# Patient Record
Sex: Male | Born: 1965 | Race: White | Hispanic: No | Smoking: Former smoker
Health system: Southern US, Community
[De-identification: ages and names within clinical notes are randomized; demographics above are authoritative.]

## PROBLEM LIST (undated history)

## (undated) DIAGNOSIS — K859 Acute pancreatitis without necrosis or infection, unspecified: Secondary | ICD-10-CM

## (undated) DIAGNOSIS — G629 Polyneuropathy, unspecified: Secondary | ICD-10-CM

## (undated) DIAGNOSIS — E11622 Type 2 diabetes mellitus with other skin ulcer: Secondary | ICD-10-CM

## (undated) DIAGNOSIS — M1990 Unspecified osteoarthritis, unspecified site: Secondary | ICD-10-CM

## (undated) DIAGNOSIS — F909 Attention-deficit hyperactivity disorder, unspecified type: Secondary | ICD-10-CM

## (undated) DIAGNOSIS — D649 Anemia, unspecified: Secondary | ICD-10-CM

## (undated) DIAGNOSIS — I639 Cerebral infarction, unspecified: Secondary | ICD-10-CM

## (undated) DIAGNOSIS — L98421 Non-pressure chronic ulcer of back limited to breakdown of skin: Secondary | ICD-10-CM

## (undated) DIAGNOSIS — E11621 Type 2 diabetes mellitus with foot ulcer: Secondary | ICD-10-CM

## (undated) DIAGNOSIS — I219 Acute myocardial infarction, unspecified: Secondary | ICD-10-CM

## (undated) DIAGNOSIS — J449 Chronic obstructive pulmonary disease, unspecified: Secondary | ICD-10-CM

## (undated) DIAGNOSIS — M109 Gout, unspecified: Secondary | ICD-10-CM

## (undated) DIAGNOSIS — I10 Essential (primary) hypertension: Secondary | ICD-10-CM

## (undated) DIAGNOSIS — K219 Gastro-esophageal reflux disease without esophagitis: Secondary | ICD-10-CM

## (undated) DIAGNOSIS — Z973 Presence of spectacles and contact lenses: Secondary | ICD-10-CM

## (undated) DIAGNOSIS — Z9289 Personal history of other medical treatment: Secondary | ICD-10-CM

## (undated) DIAGNOSIS — L02611 Cutaneous abscess of right foot: Secondary | ICD-10-CM

## (undated) DIAGNOSIS — M869 Osteomyelitis, unspecified: Secondary | ICD-10-CM

## (undated) DIAGNOSIS — F419 Anxiety disorder, unspecified: Secondary | ICD-10-CM

## (undated) HISTORY — PX: FOOT SURGERY: SHX648

## (undated) HISTORY — PX: APPENDECTOMY: SHX54

## (undated) HISTORY — PX: LEG SURGERY: SHX1003

---
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
## 2002-12-22 ENCOUNTER — Encounter: Payer: Self-pay | Admitting: Emergency Medicine

## 2002-12-22 ENCOUNTER — Emergency Department (HOSPITAL_COMMUNITY): Admission: EM | Admit: 2002-12-22 | Discharge: 2002-12-22 | Payer: Self-pay | Admitting: Emergency Medicine

## 2007-01-24 ENCOUNTER — Other Ambulatory Visit: Payer: Self-pay

## 2007-01-24 ENCOUNTER — Other Ambulatory Visit: Payer: Self-pay | Admitting: Emergency Medicine

## 2007-01-24 ENCOUNTER — Ambulatory Visit: Payer: Self-pay | Admitting: Psychiatry

## 2007-01-24 ENCOUNTER — Inpatient Hospital Stay (HOSPITAL_COMMUNITY): Admission: AD | Admit: 2007-01-24 | Discharge: 2007-01-28 | Payer: Self-pay | Admitting: Psychiatry

## 2007-05-04 ENCOUNTER — Ambulatory Visit (HOSPITAL_COMMUNITY): Admission: RE | Admit: 2007-05-04 | Discharge: 2007-05-04 | Payer: Self-pay | Admitting: Family Medicine

## 2007-06-27 ENCOUNTER — Inpatient Hospital Stay (HOSPITAL_COMMUNITY): Admission: EM | Admit: 2007-06-27 | Discharge: 2007-06-30 | Payer: Self-pay | Admitting: Psychiatry

## 2007-06-28 ENCOUNTER — Ambulatory Visit: Payer: Self-pay | Admitting: Psychiatry

## 2007-08-03 ENCOUNTER — Ambulatory Visit (HOSPITAL_COMMUNITY): Admission: RE | Admit: 2007-08-03 | Discharge: 2007-08-03 | Payer: Self-pay | Admitting: Family Medicine

## 2007-10-31 ENCOUNTER — Ambulatory Visit (HOSPITAL_COMMUNITY): Admission: RE | Admit: 2007-10-31 | Discharge: 2007-10-31 | Payer: Self-pay | Admitting: Family Medicine

## 2007-11-24 ENCOUNTER — Emergency Department (HOSPITAL_COMMUNITY): Admission: EM | Admit: 2007-11-24 | Discharge: 2007-11-24 | Payer: Self-pay | Admitting: Emergency Medicine

## 2008-06-28 ENCOUNTER — Ambulatory Visit (HOSPITAL_COMMUNITY): Admission: RE | Admit: 2008-06-28 | Discharge: 2008-06-28 | Payer: Self-pay | Admitting: Family Medicine

## 2009-01-09 ENCOUNTER — Emergency Department (HOSPITAL_COMMUNITY): Admission: EM | Admit: 2009-01-09 | Discharge: 2009-01-10 | Payer: Self-pay | Admitting: Emergency Medicine

## 2010-07-25 LAB — GLUCOSE, CAPILLARY
Glucose-Capillary: 163 mg/dL — ABNORMAL HIGH (ref 70–99)
Glucose-Capillary: 92 mg/dL (ref 70–99)

## 2010-07-25 LAB — HEPATIC FUNCTION PANEL
ALT: 12 U/L (ref 0–53)
AST: 19 U/L (ref 0–37)
Albumin: 4.5 g/dL (ref 3.5–5.2)
Alkaline Phosphatase: 57 U/L (ref 39–117)
Bilirubin, Direct: 0.1 mg/dL (ref 0.0–0.3)
Indirect Bilirubin: 0.6 mg/dL (ref 0.3–0.9)
Total Bilirubin: 0.7 mg/dL (ref 0.3–1.2)
Total Protein: 7.1 g/dL (ref 6.0–8.3)

## 2010-07-25 LAB — BASIC METABOLIC PANEL
BUN: 5 mg/dL — ABNORMAL LOW (ref 6–23)
CO2: 23 mEq/L (ref 19–32)
Calcium: 9.1 mg/dL (ref 8.4–10.5)
Chloride: 113 mEq/L — ABNORMAL HIGH (ref 96–112)
Creatinine, Ser: 0.72 mg/dL (ref 0.4–1.5)
GFR calc Af Amer: >60 mL/min (ref >60)
GFR calc non Af Amer: >60 mL/min (ref >60)
Glucose, Bld: 81 mg/dL (ref 70–99)
Potassium: 3.4 mEq/L — ABNORMAL LOW (ref 3.5–5.1)
Sodium: 143 mEq/L (ref 135–145)

## 2010-07-25 LAB — CK TOTAL AND CKMB (NOT AT ARMC)
CK, MB: 3.1 ng/mL (ref 0.3–4.0)
CK, MB: 3.8 ng/mL (ref 0.3–4.0)
Relative Index: 1.6 (ref 0.0–2.5)
Relative Index: 1.7 (ref 0.0–2.5)
Total CK: 188 U/L (ref 7–232)
Total CK: 228 U/L (ref 7–232)

## 2010-07-25 LAB — CBC
HCT: 41.1 % (ref 39.0–52.0)
Hemoglobin: 14.5 g/dL (ref 13.0–17.0)
MCHC: 35.3 g/dL (ref 30.0–36.0)
MCV: 89.7 fL (ref 78.0–100.0)
Platelets: 193 10*3/uL (ref 150–400)
RBC: 4.58 MIL/uL (ref 4.22–5.81)
RDW: 13.8 % (ref 11.5–15.5)
WBC: 13 10*3/uL — ABNORMAL HIGH (ref 4.0–10.5)

## 2010-07-25 LAB — LIPASE, BLOOD: Lipase: 18 U/L (ref 11–59)

## 2010-07-25 LAB — ETHANOL
Alcohol, Ethyl (B): 116 mg/dL — ABNORMAL HIGH (ref 0–10)
Alcohol, Ethyl (B): 289 mg/dL — ABNORMAL HIGH (ref 0–10)

## 2010-07-25 LAB — RAPID URINE DRUG SCREEN, HOSP PERFORMED
Amphetamines: NOT DETECTED
Barbiturates: NOT DETECTED
Benzodiazepines: POSITIVE — AB
Cocaine: NOT DETECTED
Opiates: POSITIVE — AB
Tetrahydrocannabinol: NOT DETECTED

## 2010-07-25 LAB — DIFFERENTIAL
Basophils Absolute: 0 10*3/uL (ref 0.0–0.1)
Basophils Relative: 0 % (ref 0–1)
Eosinophils Absolute: 0.2 10*3/uL (ref 0.0–0.7)
Eosinophils Relative: 2 % (ref 0–5)
Lymphocytes Relative: 33 % (ref 12–46)
Lymphs Abs: 4.3 10*3/uL — ABNORMAL HIGH (ref 0.7–4.0)
Monocytes Absolute: 0.7 10*3/uL (ref 0.1–1.0)
Monocytes Relative: 5 % (ref 3–12)
Neutro Abs: 7.7 10*3/uL (ref 1.7–7.7)
Neutrophils Relative %: 59 % (ref 43–77)

## 2010-07-25 LAB — POCT CARDIAC MARKERS
CKMB, poc: 1.7 ng/mL (ref 1.0–8.0)
Myoglobin, poc: 34.5 ng/mL (ref 12–200)
Troponin i, poc: <0.05 ng/mL (ref 0.00–0.09)

## 2010-07-25 LAB — D-DIMER, QUANTITATIVE: D-Dimer, Quant: 0.22 ug/mL-FEU (ref 0.00–0.48)

## 2010-07-25 LAB — TRICYCLICS SCREEN, URINE: TCA Scrn: NOT DETECTED

## 2010-07-25 LAB — TROPONIN I
Troponin I: 0.05 ng/mL (ref 0.00–0.06)
Troponin I: 0.05 ng/mL (ref 0.00–0.06)

## 2010-09-02 NOTE — H&P (Signed)
[name], PUGLIA NO.:  [id number]   MEDICAL RECORD NO.:  [id number]           PATIENT TYPE:   LOCATION:                                 FACILITY:   PHYSICIAN:  Anselm Jungling, MD  DATE OF BIRTH:  1965/05/16   DATE OF ADMISSION:  06/27/2007  DATE OF DISCHARGE:                       PSYCHIATRIC ADMISSION ASSESSMENT   IDENTIFICATION:  A 45 year old white male who is married.  This is a  voluntary admission.   HISTORY OF PRESENT ILLNESS:  This is the second [facility] admission for this  45 year old with a history of alcohol dependence.  He initially  presented in the emergency room with chest pain after chasing his  brother through the woods.  He had been drinking all day.  His initial  alcohol level was 348 in the emergency department.  He reports that he  stopped the Antabuse that he had been previously taking, several weeks  ago, and wished that he had kept it up.  He would like to be restarted  on it.  York Spaniel he is having problems with his alcohol use escalating  again, and his wife had reported in the emergency room that he had been  making some suicidal statements about harming himself and others when he  was under the influence of alcohol.  Requested help with getting back  off the alcohol again when he was in the emergency room.  Wanted to be  admitted to work on it.  Had had quite a lot of depression at home and  drinking.  Had suicidal thoughts of possibly shooting himself and has  access to guns.  He has a history of prior suicide attempts three to  four times by cutting himself.  His drinking has escalated to seven to  eight 40-ounce beers most every day of the week.  He has been drinking  alcohol since the age of 43.  He endorses a history of delirium tremors,  muscle cramping and tremors at night and sweats when he tries to cut  back on alcohol.  He denies any homicidal thoughts.  Endorses passive  suicidal thoughts today.  He was evaluated for his  chest pain in the  emergency room and remained there until the morning of March 9 due to  some tachycardia, and his workup revealed no acute process.  He was  screened with an EKG and cardiac enzymes.   PAST PSYCHIATRIC HISTORY:  This is the second [facility] admission.  He has  been followed in the past by Dr. Duayne Cal at [facility].  He was last admitted here to this unit October 6th to 10th 2008  to the service of Dr. Dub Mikes.  At that time, he was discharged on Antabuse  and Campral for alcohol cravings along with Prozac CR 25 mg daily.  He  reports that he stopped the Antabuse about a month ago and still has a  supply of that at home.  He did not take the Campral for very long after  he was discharged and continues to take the Paxil CR.   SOCIAL HISTORY:  A  45 year old white male with history of several  medical problems including some back pain has endorsed in the past some  ups and downs with his marriage.  Had some difficult relationship with  his father growing up.  Has a history of prior DWIs.  Continues to  grieve the death of his father 2 years ago.   FAMILY HISTORY:  None.   MEDICAL HISTORY:  The patient's primary care Kylar Speelman is Dr. Oval Linsey.  Medical problems are:  1. Depression.  2. Gout.  3. Pancreatitis.  4. Diabetes mellitus type 2.   PAST MEDICAL HISTORY:  Is remarkable for history of heart murmur and  surgery on his head.   CURRENT MEDICATIONS:  1. Paxil CR 25 mg.  2. Advair Diskus 250/50 one puff b.i.d.  3. Trazodone 50 mg at bedtime.   He had previously been on the metformin 1000 mg b.i.d. and Amaryl 4 mg  daily which was stopped by Dr. Janna Arch.  He is to monitor his blood  sugar and takes a carbohydrate-controlled diet.   DRUG ALLERGIES:  ARE ASPIRIN AND PENICILLIN.   POSITIVE PHYSICAL FINDINGS:  Physical exam done in the emergency room at  [facility] as noted in the record.  Urine drug screen positive  for opiates and  marijuana.  CBC:  WBC 12.3, hemoglobin 15.7, hematocrit  44, platelets 197,000.  Chemistry:  Sodium 130, potassium 3.8, chloride  93, carbon dioxide 21, BUN 7, creatinine 0.77, random glucose 107.  Liver enzymes:  SGOT 58, SGPT 24, alkaline phosphatase 91, total  bilirubin 1.5.  His lipase was 17 mg per deciliter.   MENTAL STATUS EXAM:  Fully alert male, pleasant, cooperative.  Moist  skin.  Slightly tremulous.  Appears to be in detox, and his CIWA score  was gauged at about a 12.  Speech is normal in pace, tone, production.  He is fully oriented x4, coherent, asking for help, wanting to get back  off the alcohol, regretting that he ever quit the Antabuse and would  like to get back on it as soon as possible.  Speech is normal in form  and production.  Mood neutral.  Thought process:  Logical and coherent.  He has some passive suicidal thoughts today but denies any active  suicidal thoughts.  Cognition is intact.  No confusion, flight of ideas,  or signs of delirium.  No homicidal thought.   AXIS I:  Depressive disorder NOS.  Alcohol abuse and dependence.  AXIS II:  No diagnosis.  AXIS III:  Elevated liver enzymes.  History of pancreatitis.  Diet-  controlled diabetes.  Hyponatremia.  AXIS IV:  Severe stress with grief of the father's death.  AXIS V:  Current 40, past year not known.   PLAN:  Is to voluntarily admit the patient with the goal of a safe detox  and to alleviate his suicidal thoughts.  We started him on a Librium  protocol which will take 4 days.  He is actually taking medication,  participating in our dual diagnosis group.  We are going to see if we  can secure the weapons at home by talking with his wife.  We are going  to check CBGs a.c. breakfast and supper and will recheck a CMET in 2  days.  Meanwhile, we have restarted his Paxil and talked to him about  considering restarting on the Antabuse which at this point he is  agreeable to and will see if we can arrange  ongoing follow-up  for him  with [facility].  Estimated length of stay is 5  days.      Margaret A. Lorin Picket, N.P.      Anselm Jungling, MD  Electronically Signed    MAS/MEDQ  D:  06/27/2007  T:  06/28/2007  Job:  [id number]

## 2010-09-02 NOTE — Discharge Summary (Signed)
NAMEMARKEEM, [name] NO.:  [id number]   MEDICAL RECORD NO.:  [id number]          PATIENT TYPE:  IPS   LOCATION:  0403                          FACILITY:  BH   PHYSICIAN:  Anselm Jungling, MD  DATE OF BIRTH:  1965/08/29   DATE OF ADMISSION:  06/26/2007  DATE OF DISCHARGE:  06/30/2007                               DISCHARGE SUMMARY   IDENTIFYING DATA REASON FOR ADMISSION:  This was the second [facility]  admission for [name], a 45 year old male with alcohol dependence.  He  was admitted for detoxification.  Please refer to the admission note for  further details pertaining to the symptoms, circumstances and history  that led to his hospitalization.  He was given initial an axis I  diagnosis of alcohol dependence.   MEDICAL AND LABORATORY:  The patient was in good health without any  active or chronic medical problems.  He was medically and physically  assessed by the psychiatric nurse practitioner.  He did have a history  of asthma and was continued on Advair.   HOSPITAL COURSE:  The patient was admitted to the adult inpatient  psychiatric service.  He presented as a well-nourished, well-developed  male who was initially anxious, tremulous, but alert and fully oriented,  showing signs of alcohol withdrawal.  There were no signs or symptoms of  psychosis, thought disorder or delirium.   The patient was treated with a Librium detoxification protocol, and was  also given Paxil CR 25 mg daily for depressive symptoms.   He participated in various therapeutic groups and activities, including  those geared towards 12-step recovery.  The patient had some previous AA  experience and indicated that he would embrace it again following his  discharge.  There was a family session that occurred with the patient's  wife by telephone on the 4th hospital day.  His wife was very  supportive.  Discharge and aftercare needs were discussed at length.   The patient was appropriate  for discharge on the 7th hospital day.  He  agreed to the following aftercare plan.   AFTERCARE:  The patient was to follow up with Dr. Waunita Schooner at [facility] with an appointment to be determined at the time of  this dictation.   DISCHARGE MEDICATIONS:  Paxil CR 25 mg daily, Advair 1 puff twice daily,  Antabuse 250 mg daily and trazodone 50 mg q.h.s. p.r.n. insomnia.   DISCHARGE DIAGNOSES:  AXIS I:  Alcohol dependence and substance-induced  mood disorder.  AXIS II:  Deferred.  AXIS III:  History of asthma.  AXIS IV:  Stressors, severe.  AXIS V:  GAF on discharge 60.      Anselm Jungling, MD  Electronically Signed     SPB/MEDQ  D:  07/29/2007  T:  07/29/2007  Job:  [id number]

## 2010-09-05 NOTE — Discharge Summary (Signed)
NAMEMONTI, JILEK NO.:  [id number]   MEDICAL RECORD NO.:  [id number]          PATIENT TYPE:  IPS   LOCATION:  0504                          FACILITY:  BH   PHYSICIAN:  Geoffery Lyons, M.D.      DATE OF BIRTH:  [birth year]   DATE OF ADMISSION:  01/24/2007  DATE OF DISCHARGE:  01/28/2007                               DISCHARGE SUMMARY   CHIEF COMPLAINT:  This was the first admission to [facility] for this 45 year old male, history of depression, alcohol abuse,  thinking about hurting himself, does not want to hurt himself, wanting  to be there for grandchildren, last drink some day before this  admission.   HISTORY OF PRESENT ILLNESS:  First time at Behavior Health, seeing Dr.  Kerby Nora at [facility], also sees a counselor  for history of persistent use of alcohol.  Denies any other substance  use.   MEDICAL HISTORY:  Asthma and bronchitis, pancreatitis, slipped disk in  the back, gout, diabetes mellitus.   MEDICATIONS:  1. Albuterol two puffs daily.  2. Klonopin 0.5 one in the morning and three at night.  3. Paxil CR 25 mg per day.  4. Metformin 500 two tablets twice a day.  5. Amaryl 4 mg one twice a day.   Physical exam performed failed to show any acute findings.   LABORATORY WORKUP:  Not available in the chart.   PHYSICAL EXAMINATION:  Reveals an alert, cooperative male.  Speech  normal rate, tempo and production.  Mood anxious and depressed, affect  broad.  Thought processes logical, coherent and relevant.  No evidence  of delusions.  No active suicidal ideas, no hallucinations.  Cognition  well-preserved.   ADMITTING DIAGNOSIS:  AXIS I:  Alcohol abuse, rule out dependence.  Depressive disorder not otherwise specified.  AXIS II:  No diagnosis.  AXIS III:  Asthma, bronchitis, pancreatitis, diskogenic back disease,  gout, diabetes mellitus.  AXIS IV:  Moderate.  AXIS V:  GAF upon admission 35, highest GAF  in the last year 60.   COURSE IN THE HOSPITAL:  He was admitted.  He was started in individual  and group psychotherapy.  As already stated this is a 45 year old male  who endorsed that everything was going downhill, has trouble with the  father.  The father died.  Trouble with the father's death, died when he  was 42 of a massive heart attack.  Endorsed financial struggles.  Endorsed he is broke.  Had court the 22nd driving without a license.  Endorsed decreased sleep.  Thinking about the fight all the time.  Mother not supportive.  He is disabled due to multiple physical  conditions, pancreatitis, gout, diskogenic disease.  Endorsed ups and  downs with his wife.  Day before this admit the Sunday before this  admission drank seven 14 ounce beers, drinking almost every day, can go  few months and started drinking when he was 14.  On October 8 he was  more open about his alcohol use.  Admits that he was dependent on it.  They separated due to his alcohol use.  He feels that if he were to quit  there was a chance that they would get back together.  Mostly drinks by  himself.  Wanted, he was willing to start the Antabuse as he said that  he was still going to be around the alcohol and wanting to be sure that  he did not relapse and that he was committed to abstaining.  He was  going to be back in the trailer and says that the wife was coming along  and sees that he is committed to abstaining.  So overall he was feeling  much better.  We worked on Pharmacologist, relapse prevention.  He was  more open about the past events when he was increasingly more depressed,  two previous of gestures for shooting himself but a friend stopped him.  October 10 he was in full contact with reality.  Fully detoxed.  No  active suicidal or homicidal idea, no hallucinations or delusions.  Endorsed that he was really committed to abstinence, was going to pursue  the Antabuse, was going to outpatient treatment and  was hopeful that he  was going to get back together with his wife, but was also prepared for  her to decide against this.   DISCHARGE DIAGNOSIS:  AXIS I:  Alcohol dependence.  Depressive disorder  not otherwise specified.  AXIS II:  No diagnosis.  AXIS III:  Asthma, bronchitis, diabetes mellitus, history of  pancreatitis.  AXIS IV:  Moderate.  AXIS V:  Upon discharge 50.   Discharged on Paxil CR 25 mg per day, Glucophage 500 two twice a day,  Amaryl 4 mg one twice a day, albuterol 2 puffs daily, Advair 250/50  Diskus 1 puff twice a day, Antabuse 250 mg per day, Campral 333 mg two  3x a day, albuterol inhaler 2 puffs every 4 hours, trazodone 50 mg at  bedtime for sleep.  Followup at [facility].      Geoffery Lyons, M.D.  Electronically Signed     IL/MEDQ  D:  02/25/2007  T:  02/27/2007  Job:  [id number]

## 2011-01-12 LAB — DIFFERENTIAL
Basophils Absolute: 0.1
Basophils Relative: 1
Eosinophils Absolute: 0.2
Eosinophils Relative: 2
Lymphocytes Relative: 20
Lymphs Abs: 2.5
Monocytes Absolute: 0.5
Monocytes Relative: 4
Neutro Abs: 9 — ABNORMAL HIGH
Neutrophils Relative %: 73

## 2011-01-12 LAB — CBC
HCT: 44
Hemoglobin: 15.7
MCHC: 35.5
MCV: 89.6
Platelets: 197
RBC: 4.92
RDW: 13.4
WBC: 12.3 — ABNORMAL HIGH

## 2011-01-12 LAB — POCT CARDIAC MARKERS
CKMB, poc: 1.7
CKMB, poc: 1.8
Myoglobin, poc: 54.7
Myoglobin, poc: 97.6
Operator id: 211291
Operator id: 211291
Troponin i, poc: <0.05
Troponin i, poc: <0.05

## 2011-01-12 LAB — HEPATIC FUNCTION PANEL
ALT: 24
AST: 58 — ABNORMAL HIGH
Albumin: 4.6
Alkaline Phosphatase: 91
Bilirubin, Direct: 0.2
Indirect Bilirubin: 1.3 — ABNORMAL HIGH
Total Bilirubin: 1.5 — ABNORMAL HIGH
Total Protein: 7.8

## 2011-01-12 LAB — RAPID URINE DRUG SCREEN, HOSP PERFORMED
Amphetamines: NOT DETECTED
Barbiturates: NOT DETECTED
Benzodiazepines: NOT DETECTED
Cocaine: NOT DETECTED
Opiates: POSITIVE — AB
Tetrahydrocannabinol: POSITIVE — AB

## 2011-01-12 LAB — BASIC METABOLIC PANEL
BUN: 7
CO2: 21
Calcium: 9.5
Chloride: 93 — ABNORMAL LOW
Creatinine, Ser: 0.77
GFR calc Af Amer: >60
GFR calc non Af Amer: >60
Glucose, Bld: 107 — ABNORMAL HIGH
Potassium: 3.8
Sodium: 130 — ABNORMAL LOW

## 2011-01-12 LAB — ETHANOL
Alcohol, Ethyl (B): 222 — ABNORMAL HIGH
Alcohol, Ethyl (B): 258 — ABNORMAL HIGH
Alcohol, Ethyl (B): 348 — ABNORMAL HIGH

## 2011-01-12 LAB — LIPASE, BLOOD: Lipase: 17

## 2011-01-16 LAB — CBC
HCT: 38.4 — ABNORMAL LOW
Hemoglobin: 13.2
MCHC: 34.3
MCV: 92.6
Platelets: 153
RBC: 4.15 — ABNORMAL LOW
RDW: 14.2
WBC: 7

## 2011-01-16 LAB — BASIC METABOLIC PANEL
BUN: 7
CO2: 30
Calcium: 9
Chloride: 104
Creatinine, Ser: 0.76
GFR calc Af Amer: >60
GFR calc non Af Amer: >60
Glucose, Bld: 100 — ABNORMAL HIGH
Potassium: 4.3
Sodium: 139

## 2011-01-16 LAB — DIFFERENTIAL
Basophils Absolute: 0
Basophils Relative: 0
Eosinophils Absolute: 0.2
Eosinophils Relative: 3
Lymphocytes Relative: 28
Lymphs Abs: 2
Monocytes Absolute: 0.4
Monocytes Relative: 5
Neutro Abs: 4.4
Neutrophils Relative %: 63

## 2011-01-16 LAB — POCT CARDIAC MARKERS
CKMB, poc: <1 — ABNORMAL LOW
Myoglobin, poc: 29
Troponin i, poc: <0.05

## 2011-01-29 LAB — RAPID URINE DRUG SCREEN, HOSP PERFORMED
Amphetamines: NOT DETECTED
Barbiturates: NOT DETECTED
Benzodiazepines: NOT DETECTED
Cocaine: NOT DETECTED
Opiates: NOT DETECTED
Tetrahydrocannabinol: NOT DETECTED

## 2011-01-29 LAB — CBC
HCT: 42.7
Hemoglobin: 14.8
MCHC: 34.8
MCV: 99.2
Platelets: 176
RBC: 4.3
RDW: 13.4
WBC: 8.8

## 2011-01-29 LAB — ETHANOL: Alcohol, Ethyl (B): 155 — ABNORMAL HIGH

## 2011-01-29 LAB — BASIC METABOLIC PANEL WITH GFR
BUN: 3 — ABNORMAL LOW
CO2: 22
Calcium: 9
Chloride: 105
Creatinine, Ser: 0.82
GFR calc non Af Amer: >60
Glucose, Bld: 154 — ABNORMAL HIGH
Potassium: 3.7
Sodium: 137

## 2011-01-29 LAB — DIFFERENTIAL
Basophils Absolute: 0.1
Basophils Relative: 1
Eosinophils Absolute: 0.4
Eosinophils Relative: 4
Lymphocytes Relative: 23
Lymphs Abs: 2
Monocytes Absolute: 0.5
Monocytes Relative: 6
Neutro Abs: 5.8
Neutrophils Relative %: 66

## 2011-04-18 ENCOUNTER — Other Ambulatory Visit: Payer: Self-pay

## 2011-04-18 ENCOUNTER — Emergency Department (HOSPITAL_COMMUNITY)
Admission: EM | Admit: 2011-04-18 | Discharge: 2011-04-18 | Disposition: A | Discharge status: Home / Self Care | Payer: Medicaid Other | Attending: Emergency Medicine | Admitting: Emergency Medicine

## 2011-04-18 ENCOUNTER — Encounter: Payer: Self-pay | Admitting: Emergency Medicine

## 2011-04-18 DIAGNOSIS — J45909 Unspecified asthma, uncomplicated: Secondary | ICD-10-CM | POA: Insufficient documentation

## 2011-04-18 DIAGNOSIS — F1010 Alcohol abuse, uncomplicated: Secondary | ICD-10-CM

## 2011-04-18 DIAGNOSIS — I498 Other specified cardiac arrhythmias: Secondary | ICD-10-CM | POA: Insufficient documentation

## 2011-04-18 DIAGNOSIS — F1910 Other psychoactive substance abuse, uncomplicated: Secondary | ICD-10-CM | POA: Insufficient documentation

## 2011-04-18 DIAGNOSIS — F3289 Other specified depressive episodes: Secondary | ICD-10-CM | POA: Insufficient documentation

## 2011-04-18 DIAGNOSIS — F329 Major depressive disorder, single episode, unspecified: Secondary | ICD-10-CM | POA: Insufficient documentation

## 2011-04-18 DIAGNOSIS — R45851 Suicidal ideations: Secondary | ICD-10-CM | POA: Insufficient documentation

## 2011-04-18 DIAGNOSIS — I10 Essential (primary) hypertension: Secondary | ICD-10-CM | POA: Insufficient documentation

## 2011-04-18 DIAGNOSIS — E119 Type 2 diabetes mellitus without complications: Secondary | ICD-10-CM | POA: Insufficient documentation

## 2011-04-18 HISTORY — DX: Essential (primary) hypertension: I10

## 2011-04-18 LAB — GLUCOSE, CAPILLARY
Glucose-Capillary: 353 mg/dL — ABNORMAL HIGH (ref 70–99)
Glucose-Capillary: 279 mg/dL — ABNORMAL HIGH (ref 70–99)

## 2011-04-18 LAB — COMPREHENSIVE METABOLIC PANEL
ALT: 37 U/L (ref 0–53)
AST: 55 U/L — ABNORMAL HIGH (ref 0–37)
Albumin: 4 g/dL (ref 3.5–5.2)
Alkaline Phosphatase: 114 U/L (ref 39–117)
BUN: 8 mg/dL (ref 6–23)
CO2: 23 mEq/L (ref 19–32)
Calcium: 9.5 mg/dL (ref 8.4–10.5)
Chloride: 99 mEq/L (ref 96–112)
Creatinine, Ser: 0.71 mg/dL (ref 0.50–1.35)
GFR calc Af Amer: >90 mL/min (ref >90)
GFR calc non Af Amer: >90 mL/min (ref >90)
Glucose, Bld: 404 mg/dL — ABNORMAL HIGH (ref 70–99)
Potassium: 3.9 mEq/L (ref 3.5–5.1)
Sodium: 135 mEq/L (ref 135–145)
Total Bilirubin: 0.3 mg/dL (ref 0.3–1.2)
Total Protein: 7.7 g/dL (ref 6.0–8.3)

## 2011-04-18 LAB — SALICYLATE LEVEL: Salicylate Lvl: <2 mg/dL — ABNORMAL LOW (ref 2.8–20.0)

## 2011-04-18 LAB — CBC
HCT: 43 % (ref 39.0–52.0)
Hemoglobin: 15 g/dL (ref 13.0–17.0)
MCH: 31.8 pg (ref 26.0–34.0)
MCHC: 34.9 g/dL (ref 30.0–36.0)
MCV: 91.1 fL (ref 78.0–100.0)
Platelets: 186 10*3/uL (ref 150–400)
RBC: 4.72 MIL/uL (ref 4.22–5.81)
RDW: 12.7 % (ref 11.5–15.5)
WBC: 8.5 10*3/uL (ref 4.0–10.5)

## 2011-04-18 LAB — RAPID URINE DRUG SCREEN, HOSP PERFORMED
Amphetamines: NOT DETECTED
Barbiturates: NOT DETECTED
Benzodiazepines: POSITIVE — AB
Cocaine: NOT DETECTED
Opiates: NOT DETECTED
Tetrahydrocannabinol: POSITIVE — AB

## 2011-04-18 LAB — ETHANOL: Alcohol, Ethyl (B): 267 mg/dL — ABNORMAL HIGH (ref 0–11)

## 2011-04-18 LAB — ACETAMINOPHEN LEVEL: Acetaminophen (Tylenol), Serum: <15 ug/mL (ref 10–30)

## 2011-04-18 MED ORDER — INSULIN ASPART 100 UNIT/ML ~~LOC~~ SOLN
10.0000 [IU] | Freq: Once | SUBCUTANEOUS | Status: AC
  Administered 2011-04-18: 100 [IU] via SUBCUTANEOUS
  Filled 2011-04-18 (×2): qty 3

## 2011-04-18 NOTE — BH Assessment (Signed)
Assessment Note   [name] is an 45 y.o. male. PT EXPRESSED THAT HE HAD AN ARGUMENT WITH WIFE & HAD EXPRESSED THAT HE WANTED TO HARM HIMSELF VIA CUTTING. PT SEEMS AGITATED & UPSET THAT NO ONE TOLD HIM WHAT WAS GOING ON. PT ADMITS TO A HX OF DEPRESSION  & CUTTING LONG TIME AGO BUT DENIES CURRENT CUTTING & IDEATION. PT SAYS HE HAS BEEN DRINKING SINCE HE WAS 45 YRS OLD & WAS DRINKING FOR SOCIAL REASON BUT LATER TURNED TO DEALING WITH STRESSORS. PT EXPRESSES NOT THAT HE  NO LONGER SUICIDAL & HAS NEVER BEEN HOMOCIDAL. PT IS NOT REQUESTING TO GO HOME. PT SAYS HE GOES TO AA MEETING FOR SUPPORT IN [place] BUT DENIES CURRENTLY SEEING A THERAPIST. PT SAYS HE WAS ADMITTED AT [facility] LONG TIME AGO FOR DETOX. PT HAD EXPRESSED HARM TO SELF IN THE PRESENCES OF WIFE. LEO & EDP BUT NOW IS RECANTING STATEMENT. CLINICIAN DID EXPLAIN TO PT THAT THERE WAS A CHANCE EDP WILL NOT BE WILLING TO LET GO HOME & POSSIBLE PETITION IF HE HAS SOME CONCERNS. CLINICIAN HAS SUGGESTED A TELEPSYCH TO DETERMINE DISPOSITION. PT HAS EXPRESSED DEPRESSION & CLAIMS HE COULD CONTRACT FOR SAFETY BUT IS QUITE AGITATED & EXPRESSED WANTING TO GET AWAY FROM HERE.  Axis I: Substance Abuse and Substance Induced Mood Disorder Axis II: Deferred Axis III:  Past Medical History  Diagnosis Date  . Diabetes mellitus   . Hypertension   . Asthma    Axis IV: problems related to social environment, problems with primary support group and MARITAL ISSUES Axis V: 11-20 some danger of hurting self or others possible OR occasionally fails to maintain minimal personal hygiene OR gross impairment in communication  Past Medical History:  Past Medical History  Diagnosis Date  . Diabetes mellitus   . Hypertension   . Asthma     Past Surgical History  Procedure Date  . Appendectomy     Family History: No family history on file.  Social History:  reports that he has never smoked. He does not have any smokeless tobacco history on file. He reports  that he drinks alcohol. He reports that he does not use illicit drugs.  Additional Social History:    Allergies:  Allergies  Allergen Reactions  . Aspirin   . Penicillins     Home Medications:  Medications Prior to Admission  Medication Dose Route Frequency Provider Last Rate Last Dose  . insulin aspart (novoLOG) injection 10 Units  10 Units Subcutaneous Once Vida Roller, MD   100 Units at 04/18/11 0604   No current outpatient prescriptions on file as of 04/18/2011.    OB/GYN Status:  No LMP for male patient.  General Assessment Data Location of Assessment: [facility] ACT Assessment: Yes Living Arrangements: Spouse/significant other Can pt return to current living arrangement?: Yes Admission Status: Voluntary Is patient capable of signing voluntary admission?: Yes Transfer from: Home Referral Source: Self/Family/Friend     Risk to self Suicidal Ideation: Yes-Currently Present Suicidal Intent: Yes-Currently Present Is patient at risk for suicide?: Yes Suicidal Plan?: Yes-Currently Present Specify Current Suicidal Plan: CUT SELF Access to Means: Yes Specify Access to Suicidal Means: KNIFE What has been your use of drugs/alcohol within the last 12 months?: ETOH STARTED AT AGE 76, DRINKS 40OZ(1) & 5TH LIQUOR ; DAILY & LAS T DRINK WAS TODAY Previous Attempts/Gestures: No How many times?: 0  Other Self Harm Risks: NA Triggers for Past Attempts: Family contact Intentional Self Injurious Behavior:  Cutting Family Suicide History: Yes Recent stressful life event(s): Conflict (Comment);Other (Comment) (SA) Persecutory voices/beliefs?: No Depression: Yes Depression Symptoms: Loss of interest in usual pleasures;Feeling worthless/self pity;Feeling angry/irritable Substance abuse history and/or treatment for substance abuse?: Yes Suicide prevention information given to non-admitted patients: Yes  Risk to Others Homicidal Ideation: No Thoughts of Harm to Others: No Current  Homicidal Intent: No Current Homicidal Plan: No Access to Homicidal Means: No Identified Victim: NA History of harm to others?: No Assessment of Violence: None Noted Violent Behavior Description: AGITATED Does patient have access to weapons?: Yes (Comment) Criminal Charges Pending?: No Does patient have a court date: No  Psychosis Hallucinations: None noted Delusions: None noted  Mental Status Report Appear/Hygiene: Body odor;Disheveled Eye Contact: Fair Motor Activity: Agitation;Restlessness;Mannerisms Speech: Pressured;Rapid Level of Consciousness: Alert Mood: Depressed;Angry;Anhedonia Affect: Irritable Anxiety Level: None Thought Processes: Coherent;Relevant Judgement: Impaired Orientation: Person;Place;Time;Situation Obsessive Compulsive Thoughts/Behaviors: None  Cognitive Functioning Concentration: Decreased Memory: Recent Intact;Remote Intact IQ: Average Insight: Poor Impulse Control: Poor Appetite: Poor Weight Loss: 0  Weight Gain: 0  Sleep: Decreased Total Hours of Sleep: 3  Vegetative Symptoms: None  Prior Inpatient Therapy Prior Inpatient Therapy: Yes Prior Therapy Dates: 2005 Prior Therapy Facilty/Provider(s): [facility] Reason for Treatment: DETOX  Prior Outpatient Therapy Prior Outpatient Therapy: Yes Prior Therapy Dates: CURRENT Prior Therapy Facilty/Provider(s): [facility] Reason for Treatment: SUPPORT            Values / Beliefs Cultural Requests During Hospitalization: None Spiritual Requests During Hospitalization: None        Additional Information 1:1 In Past 12 Months?: Yes CIRT Risk: No Elopement Risk: No Does patient have medical clearance?: Yes     Disposition:  Disposition Disposition of Patient: Inpatient treatment program;Referred to ([facility]) Type of inpatient treatment program: Adult  On Site Evaluation by:   Reviewed with Physician:     Waldron Session 04/18/2011 8:59 AM

## 2011-04-18 NOTE — ED Provider Notes (Addendum)
History     CSN: [id number]  Arrival date & time 04/18/11  0230   First MD Initiated Contact with Patient 04/18/11 [phone]      Chief Complaint  Patient presents with  . Suicidal    (Consider location/radiation/quality/duration/timing/severity/associated sxs/prior treatment) HPI Comments: Patient is a 45 year old male with a history of asthma and depression with suicidal thoughts and attempts in the past. He presents with acute alcohol intoxication and complaints of suicidal thoughts. He states that he has been wanting to cut his wrists. When asked why he has these thoughts he cannot give an explanation but states it's because" suicidal". He admits to drinking alcohol daily but states it's usually just one beer a day, tonight he states he drank a fifth and a half of liquor. He denies overdose of other medications or substances.  Depression and suicidal thoughts are intermittent, currently not well controlled on Abilify and Paxil, not associated with a suicide attempt this evening. Symptoms are gradually getting worse.  Psychiatrist: Dr. Betti Cruz  The history is provided by the patient and the EMS personnel.    Past Medical History  Diagnosis Date  . Diabetes mellitus   . Hypertension   . Asthma     Past Surgical History  Procedure Date  . Appendectomy     No family history on file.  History  Substance Use Topics  . Smoking status: Never Smoker   . Smokeless tobacco: Not on file  . Alcohol Use: Yes      Review of Systems  All other systems reviewed and are negative.    Allergies  Aspirin and Penicillins  Home Medications   Current Outpatient Rx  Name Route Sig Dispense Refill  . VENTOLIN HFA IN Inhalation Inhale into the lungs.      . ARIPIPRAZOLE 15 MG PO TABS Oral Take 15 mg by mouth daily.      Marland Kitchen FLUTICASONE-SALMETEROL 250-50 MCG/DOSE IN AEPB Inhalation Inhale 1 puff into the lungs every 12 (twelve) hours.      Marland Kitchen PAROXETINE HCL 25 MG PO TB24 Oral Take 25 mg  by mouth every morning.        BP 142/91  Pulse 91  Temp(Src) 98.3 F (36.8 C) (Oral)  Resp 20  Ht 6' (1.829 m)  Wt 280 lb (127.007 kg)  BMI 37.97 kg/m2  SpO2 97%  Physical Exam  Nursing note and vitals reviewed. Constitutional: He appears well-developed and well-nourished. No distress.  HENT:  Head: Normocephalic and atraumatic.  Mouth/Throat: Oropharynx is clear and moist. No oropharyngeal exudate.  Eyes: Conjunctivae and EOM are normal. Pupils are equal, round, and reactive to light. Right eye exhibits no discharge. Left eye exhibits no discharge. No scleral icterus.  Neck: Normal range of motion. Neck supple. No JVD present. No thyromegaly present.  Cardiovascular: Normal rate, regular rhythm, normal heart sounds and intact distal pulses.  Exam reveals no gallop and no friction rub.   No murmur heard. Pulmonary/Chest: Effort normal and breath sounds normal. No respiratory distress. He has no wheezes. He has no rales.  Abdominal: Soft. Bowel sounds are normal. He exhibits no distension and no mass. There is no tenderness.  Musculoskeletal: Normal range of motion. He exhibits no edema and no tenderness.  Lymphadenopathy:    He has no cervical adenopathy.  Neurological: He is alert. Coordination normal.  Skin: Skin is warm and dry. No rash noted. No erythema.  Psychiatric: He has a normal mood and affect. His behavior is normal.  Patient with depression, suicidal thoughts to cut his wrists. Denies hallucinations, affect is blunted    ED Course  Procedures (including critical care time)  Labs Reviewed  COMPREHENSIVE METABOLIC PANEL - Abnormal; Notable for the following:    Glucose, Bld 404 (*)    AST 55 (*)    All other components within normal limits  URINE RAPID DRUG SCREEN (HOSP PERFORMED) - Abnormal; Notable for the following:    Benzodiazepines POSITIVE (*)    Tetrahydrocannabinol POSITIVE (*)    All other components within normal limits  ETHANOL - Abnormal;  Notable for the following:    Alcohol, Ethyl (B) 267 (*)    All other components within normal limits  SALICYLATE LEVEL - Abnormal; Notable for the following:    Salicylate Lvl <2.0 (*)    All other components within normal limits  GLUCOSE, CAPILLARY - Abnormal; Notable for the following:    Glucose-Capillary 353 (*)    All other components within normal limits  CBC  ACETAMINOPHEN LEVEL  POCT CBG MONITORING   No results found.   1. Alcohol abuse   2. Suicidal thoughts       MDM  Suicidal thoughts, substance abuse with alcohol intoxication. We'll allow him to sober up, rule out coingestants and overdose, obtain psychiatric consultation.  Laboratory evaluation unremarkable other than urine drug screen showing positive benzos and marijuana. Alcohol level elevated.  Patient reevaluated and appears calm but has persistent suicidal intentions.  He has complained of having chest pain which she states is chronic, and according to old medical records has been present on prior ER evaluations as well. EKG done at the bedside shows no acute ischemic findings.  ED ECG REPORT   Date: 04/18/2011   Rate: 104  Rhythm: sinus tachycardia  QRS Axis: normal  Intervals: normal  ST/T Wave abnormalities: normal  Conduction Disutrbances:none  Narrative Interpretation:   Old EKG Reviewed: unchanged and Unchanged from 01/09/2009   Change of shift - care signed out to Dr. Manus Gunning  Will d/w ACT team for placement.  Vida Roller, MD 04/18/11 [id number]  Vida Roller, MD 04/18/11 [phone]

## 2011-04-18 NOTE — ED Notes (Signed)
Patient provided breakfast tray

## 2011-04-18 NOTE — ED Notes (Signed)
After being assessed by EDP, pt reports substernal cp starting earlier in the day, EKG completed upon arrival and given to EDP

## 2011-04-18 NOTE — ED Notes (Signed)
Per EMS, pt states he is suicidal with a plan to cut his wrists.  Pt also requesting assistance with detox from alcohol.

## 2011-04-18 NOTE — ED Notes (Signed)
Patient reevaluated at 9:23 AM. He is awake, alert, oriented x3 is sober. He denies any suicidal or homicidal thoughts. He states he does term are what happened last night but admits to heavy drinking. He complains of chest pain which Dr. Hyacinth Meeker has evaluated and has been chronic by patient's report. EKG shows no ischemic findings.  He has been evaluated by the ACT team and expressed that he is not suicidal to them. He is safe for discharge.  Glynn Octave, MD 04/18/11 [phone]

## 2011-07-28 ENCOUNTER — Emergency Department (HOSPITAL_COMMUNITY): Payer: Medicaid Other

## 2011-07-28 ENCOUNTER — Observation Stay (HOSPITAL_COMMUNITY)
Admission: EM | Admit: 2011-07-28 | Discharge: 2011-07-30 | DRG: 343 | Disposition: A | Discharge status: Home / Self Care | Payer: Medicaid Other | Attending: General Surgery | Admitting: General Surgery

## 2011-07-28 ENCOUNTER — Encounter (HOSPITAL_COMMUNITY): Payer: Self-pay | Admitting: Anesthesiology

## 2011-07-28 ENCOUNTER — Encounter (HOSPITAL_COMMUNITY): Payer: Self-pay

## 2011-07-28 ENCOUNTER — Encounter (HOSPITAL_COMMUNITY)
Admission: EM | Disposition: A | Discharge status: Home / Self Care | Payer: Self-pay | Source: Home / Self Care | Attending: Emergency Medicine

## 2011-07-28 ENCOUNTER — Emergency Department (HOSPITAL_COMMUNITY): Payer: Medicaid Other | Admitting: Anesthesiology

## 2011-07-28 DIAGNOSIS — J45909 Unspecified asthma, uncomplicated: Secondary | ICD-10-CM | POA: Diagnosis present

## 2011-07-28 DIAGNOSIS — Z79899 Other long term (current) drug therapy: Secondary | ICD-10-CM

## 2011-07-28 DIAGNOSIS — K3580 Unspecified acute appendicitis: Principal | ICD-10-CM | POA: Diagnosis present

## 2011-07-28 DIAGNOSIS — I10 Essential (primary) hypertension: Secondary | ICD-10-CM | POA: Diagnosis present

## 2011-07-28 DIAGNOSIS — Z880 Allergy status to penicillin: Secondary | ICD-10-CM

## 2011-07-28 DIAGNOSIS — E119 Type 2 diabetes mellitus without complications: Secondary | ICD-10-CM | POA: Diagnosis present

## 2011-07-28 HISTORY — DX: Acute pancreatitis without necrosis or infection, unspecified: K85.90

## 2011-07-28 HISTORY — PX: LAPAROSCOPIC APPENDECTOMY: SHX408

## 2011-07-28 LAB — BASIC METABOLIC PANEL
BUN: 7 mg/dL (ref 6–23)
CO2: 28 mEq/L (ref 19–32)
Calcium: 10.2 mg/dL (ref 8.4–10.5)
Chloride: 91 mEq/L — ABNORMAL LOW (ref 96–112)
Creatinine, Ser: 0.6 mg/dL (ref 0.50–1.35)
GFR calc Af Amer: >90 mL/min (ref >90)
GFR calc non Af Amer: >90 mL/min (ref >90)
Glucose, Bld: 327 mg/dL — ABNORMAL HIGH (ref 70–99)
Potassium: 4 mEq/L (ref 3.5–5.1)
Sodium: 130 mEq/L — ABNORMAL LOW (ref 135–145)

## 2011-07-28 LAB — LIPASE, BLOOD: Lipase: 12 U/L (ref 11–59)

## 2011-07-28 LAB — URINALYSIS, ROUTINE W REFLEX MICROSCOPIC
Bilirubin Urine: NEGATIVE
Glucose, UA: >1000 mg/dL — AB
Hgb urine dipstick: NEGATIVE
Ketones, ur: NEGATIVE mg/dL
Leukocytes, UA: NEGATIVE
Nitrite: NEGATIVE
Protein, ur: NEGATIVE mg/dL
Specific Gravity, Urine: 1.01 (ref 1.005–1.030)
Urobilinogen, UA: 1 mg/dL (ref 0.0–1.0)
pH: 7.5 (ref 5.0–8.0)

## 2011-07-28 LAB — GLUCOSE, CAPILLARY
Glucose-Capillary: 255 mg/dL — ABNORMAL HIGH (ref 70–99)
Glucose-Capillary: 263 mg/dL — ABNORMAL HIGH (ref 70–99)

## 2011-07-28 LAB — CBC
HCT: 42.4 % (ref 39.0–52.0)
Hemoglobin: 15.2 g/dL (ref 13.0–17.0)
MCH: 32.1 pg (ref 26.0–34.0)
MCHC: 35.8 g/dL (ref 30.0–36.0)
MCV: 89.5 fL (ref 78.0–100.0)
Platelets: 145 10*3/uL — ABNORMAL LOW (ref 150–400)
RBC: 4.74 MIL/uL (ref 4.22–5.81)
RDW: 12.7 % (ref 11.5–15.5)
WBC: 14.9 10*3/uL — ABNORMAL HIGH (ref 4.0–10.5)

## 2011-07-28 LAB — DIFFERENTIAL
Basophils Absolute: 0 10*3/uL (ref 0.0–0.1)
Basophils Relative: 0 % (ref 0–1)
Eosinophils Absolute: 0.1 10*3/uL (ref 0.0–0.7)
Eosinophils Relative: 1 % (ref 0–5)
Lymphocytes Relative: 11 % — ABNORMAL LOW (ref 12–46)
Lymphs Abs: 1.7 10*3/uL (ref 0.7–4.0)
Monocytes Absolute: 0.7 10*3/uL (ref 0.1–1.0)
Monocytes Relative: 5 % (ref 3–12)
Neutro Abs: 12.4 10*3/uL — ABNORMAL HIGH (ref 1.7–7.7)
Neutrophils Relative %: 83 % — ABNORMAL HIGH (ref 43–77)

## 2011-07-28 LAB — ETHANOL: Alcohol, Ethyl (B): <11 mg/dL (ref 0–11)

## 2011-07-28 LAB — URINE MICROSCOPIC-ADD ON

## 2011-07-28 SURGERY — APPENDECTOMY, LAPAROSCOPIC
Anesthesia: General | Site: Abdomen | Wound class: Contaminated

## 2011-07-28 MED ORDER — GLYCOPYRROLATE 0.2 MG/ML IJ SOLN
INTRAMUSCULAR | Status: DC | PRN
  Administered 2011-07-28: 0.6 mg via INTRAVENOUS

## 2011-07-28 MED ORDER — CIPROFLOXACIN IN D5W 400 MG/200ML IV SOLN
400.0000 mg | INTRAVENOUS | Status: AC
  Administered 2011-07-28: 400 mg via INTRAVENOUS
  Filled 2011-07-28: qty 200

## 2011-07-28 MED ORDER — FLUTICASONE-SALMETEROL 250-50 MCG/DOSE IN AEPB
1.0000 | INHALATION_SPRAY | Freq: Two times a day (BID) | RESPIRATORY_TRACT | Status: DC
  Administered 2011-07-28 – 2011-07-30 (×4): 1 via RESPIRATORY_TRACT
  Filled 2011-07-28: qty 14

## 2011-07-28 MED ORDER — MIDAZOLAM HCL 5 MG/5ML IJ SOLN
INTRAMUSCULAR | Status: DC | PRN
  Administered 2011-07-28: 2 mg via INTRAVENOUS

## 2011-07-28 MED ORDER — FENTANYL CITRATE 0.05 MG/ML IJ SOLN
50.0000 ug | Freq: Once | INTRAMUSCULAR | Status: AC
  Administered 2011-07-28: 50 ug via INTRAVENOUS

## 2011-07-28 MED ORDER — FLUTICASONE-SALMETEROL 250-50 MCG/DOSE IN AEPB
INHALATION_SPRAY | RESPIRATORY_TRACT | Status: AC
  Filled 2011-07-28: qty 14

## 2011-07-28 MED ORDER — ALBUTEROL SULFATE HFA 108 (90 BASE) MCG/ACT IN AERS: 2.0000 | INHALATION_SPRAY | Freq: Four times a day (QID) | RESPIRATORY_TRACT | Status: DC | PRN

## 2011-07-28 MED ORDER — SODIUM CHLORIDE 0.9 % IV SOLN
INTRAVENOUS | Status: DC
  Administered 2011-07-28: 12:00:00 via INTRAVENOUS

## 2011-07-28 MED ORDER — HYDROMORPHONE HCL PF 1 MG/ML IJ SOLN
0.5000 mg | INTRAMUSCULAR | Status: DC | PRN
  Administered 2011-07-28 – 2011-07-29 (×3): 0.5 mg via INTRAVENOUS
  Filled 2011-07-28 (×4): qty 1

## 2011-07-28 MED ORDER — ONDANSETRON HCL 4 MG/2ML IJ SOLN: 4.0000 mg | Freq: Four times a day (QID) | INTRAMUSCULAR | Status: DC | PRN

## 2011-07-28 MED ORDER — SODIUM CHLORIDE 0.9 % IV SOLN
1.0000 g | Freq: Once | INTRAVENOUS | Status: DC
  Filled 2011-07-28: qty 1

## 2011-07-28 MED ORDER — ONDANSETRON HCL 4 MG PO TABS: 4.0000 mg | ORAL_TABLET | Freq: Four times a day (QID) | ORAL | Status: DC | PRN

## 2011-07-28 MED ORDER — FENTANYL CITRATE 0.05 MG/ML IJ SOLN
INTRAMUSCULAR | Status: DC | PRN
  Administered 2011-07-28: 150 ug via INTRAVENOUS
  Administered 2011-07-28: 100 ug via INTRAVENOUS
  Administered 2011-07-28 (×2): 50 ug via INTRAVENOUS

## 2011-07-28 MED ORDER — ONDANSETRON HCL 4 MG/2ML IJ SOLN
4.0000 mg | Freq: Once | INTRAMUSCULAR | Status: AC
  Administered 2011-07-28: 4 mg via INTRAVENOUS
  Filled 2011-07-28: qty 2

## 2011-07-28 MED ORDER — ENOXAPARIN SODIUM 40 MG/0.4ML ~~LOC~~ SOLN
40.0000 mg | Freq: Once | SUBCUTANEOUS | Status: DC
  Filled 2011-07-28: qty 0.4

## 2011-07-28 MED ORDER — HYDROMORPHONE HCL PF 1 MG/ML IJ SOLN
1.0000 mg | Freq: Once | INTRAMUSCULAR | Status: AC
  Administered 2011-07-28: 1 mg via INTRAVENOUS
  Filled 2011-07-28: qty 1

## 2011-07-28 MED ORDER — PAROXETINE HCL ER 25 MG PO TB24
25.0000 mg | ORAL_TABLET | Freq: Every day | ORAL | Status: DC
  Administered 2011-07-29 – 2011-07-30 (×2): 25 mg via ORAL
  Filled 2011-07-28 (×3): qty 1

## 2011-07-28 MED ORDER — METRONIDAZOLE IN NACL 5-0.79 MG/ML-% IV SOLN
500.0000 mg | Freq: Four times a day (QID) | INTRAVENOUS | Status: AC
  Administered 2011-07-28: 500 mg via INTRAVENOUS
  Filled 2011-07-28: qty 100

## 2011-07-28 MED ORDER — HYDROCODONE-ACETAMINOPHEN 5-325 MG PO TABS
1.0000 | ORAL_TABLET | ORAL | Status: DC | PRN
  Administered 2011-07-28: 1 via ORAL
  Administered 2011-07-29: 2 via ORAL
  Filled 2011-07-28: qty 1
  Filled 2011-07-28: qty 2

## 2011-07-28 MED ORDER — IOHEXOL 300 MG/ML  SOLN
100.0000 mL | Freq: Once | INTRAMUSCULAR | Status: AC | PRN
  Administered 2011-07-28: 100 mL via INTRAVENOUS

## 2011-07-28 MED ORDER — ROCURONIUM BROMIDE 100 MG/10ML IV SOLN
INTRAVENOUS | Status: DC | PRN
  Administered 2011-07-28: 10 mg via INTRAVENOUS
  Administered 2011-07-28: 20 mg via INTRAVENOUS

## 2011-07-28 MED ORDER — NEOSTIGMINE METHYLSULFATE 1 MG/ML IJ SOLN
INTRAMUSCULAR | Status: DC | PRN
  Administered 2011-07-28: 4 mg via INTRAVENOUS

## 2011-07-28 MED ORDER — SODIUM CHLORIDE 0.9 % IR SOLN
Status: DC | PRN
  Administered 2011-07-28: 1000 mL

## 2011-07-28 MED ORDER — PROPOFOL 10 MG/ML IV BOLUS
INTRAVENOUS | Status: DC | PRN
  Administered 2011-07-28: 180 mg via INTRAVENOUS

## 2011-07-28 MED ORDER — DIAZEPAM 5 MG PO TABS
10.0000 mg | ORAL_TABLET | Freq: Four times a day (QID) | ORAL | Status: DC
  Administered 2011-07-28 – 2011-07-29 (×5): 10 mg via ORAL
  Administered 2011-07-30: 5 mg via ORAL
  Filled 2011-07-28 (×6): qty 2

## 2011-07-28 MED ORDER — PANTOPRAZOLE SODIUM 40 MG PO TBEC
40.0000 mg | DELAYED_RELEASE_TABLET | Freq: Every day | ORAL | Status: DC
  Administered 2011-07-28 – 2011-07-30 (×3): 40 mg via ORAL
  Filled 2011-07-28 (×3): qty 1

## 2011-07-28 MED ORDER — ARIPIPRAZOLE 10 MG PO TABS
15.0000 mg | ORAL_TABLET | Freq: Every day | ORAL | Status: DC
  Administered 2011-07-28 – 2011-07-29 (×2): 15 mg via ORAL
  Filled 2011-07-28 (×2): qty 2

## 2011-07-28 MED ORDER — FENTANYL CITRATE 0.05 MG/ML IJ SOLN
INTRAMUSCULAR | Status: AC
  Administered 2011-07-28: 50 ug via INTRAVENOUS
  Filled 2011-07-28: qty 2

## 2011-07-28 MED ORDER — SUCCINYLCHOLINE CHLORIDE 20 MG/ML IJ SOLN
INTRAMUSCULAR | Status: DC | PRN
  Administered 2011-07-28: 120 mg via INTRAVENOUS

## 2011-07-28 MED ORDER — LABETALOL HCL 5 MG/ML IV SOLN
INTRAVENOUS | Status: DC | PRN
  Administered 2011-07-28 (×2): 5 mg via INTRAVENOUS

## 2011-07-28 MED ORDER — BUPIVACAINE HCL 0.5 % IJ SOLN
INTRAMUSCULAR | Status: DC | PRN
  Administered 2011-07-28: 10 mL

## 2011-07-28 MED ORDER — LACTATED RINGERS IV SOLN
INTRAVENOUS | Status: DC | PRN
  Administered 2011-07-28 (×2): via INTRAVENOUS

## 2011-07-28 MED ORDER — LIDOCAINE HCL (CARDIAC) 10 MG/ML IV SOLN
INTRAVENOUS | Status: DC | PRN
  Administered 2011-07-28: 50 mg via INTRAVENOUS

## 2011-07-28 SURGICAL SUPPLY — 48 items
BAG HAMPER (MISCELLANEOUS) ×2 IMPLANT
BENZOIN TINCTURE PRP APPL 2/3 (GAUZE/BANDAGES/DRESSINGS) ×2 IMPLANT
CLOTH BEACON ORANGE TIMEOUT ST (SAFETY) ×2 IMPLANT
COVER SURGICAL LIGHT HANDLE (MISCELLANEOUS) ×4 IMPLANT
CUTTER ENDO LINEAR 45M (STAPLE) ×2 IMPLANT
CUTTER LINEAR ENDO 35 ETS (STAPLE) ×2 IMPLANT
DECANTER SPIKE VIAL GLASS SM (MISCELLANEOUS) ×2 IMPLANT
DEVICE TROCAR PUNCTURE CLOSURE (ENDOMECHANICALS) ×2 IMPLANT
DISSECTOR BLUNT TIP ENDO 5MM (MISCELLANEOUS) IMPLANT
DURAPREP 26ML APPLICATOR (WOUND CARE) ×2 IMPLANT
ELECT REM PT RETURN 9FT ADLT (ELECTROSURGICAL) ×2
ELECTRODE REM PT RTRN 9FT ADLT (ELECTROSURGICAL) ×1 IMPLANT
FILTER SMOKE EVAC LAPAROSHD (FILTER) ×2 IMPLANT
FORMALIN 10 PREFIL 120ML (MISCELLANEOUS) ×2 IMPLANT
GLOVE BIOGEL PI IND STRL 7.5 (GLOVE) ×1 IMPLANT
GLOVE BIOGEL PI IND STRL 8 (GLOVE) ×1 IMPLANT
GLOVE BIOGEL PI INDICATOR 7.5 (GLOVE) ×1
GLOVE BIOGEL PI INDICATOR 8 (GLOVE) ×1
GLOVE ECLIPSE 6.5 STRL STRAW (GLOVE) ×2 IMPLANT
GLOVE ECLIPSE 7.0 STRL STRAW (GLOVE) ×2 IMPLANT
GLOVE INDICATOR 7.0 STRL GRN (GLOVE) ×2 IMPLANT
GOWN STRL REIN XL XLG (GOWN DISPOSABLE) ×4 IMPLANT
INST SET LAPROSCOPIC AP (KITS) ×2 IMPLANT
KIT ROOM TURNOVER APOR (KITS) ×2 IMPLANT
LIGASURE 5MM LAPAROSCOPIC (INSTRUMENTS) ×2 IMPLANT
MANIFOLD NEPTUNE II (INSTRUMENTS) ×2 IMPLANT
NEEDLE INSUFFLATION 14GA 120MM (NEEDLE) ×2 IMPLANT
NS IRRIG 1000ML POUR BTL (IV SOLUTION) ×2 IMPLANT
PACK LAP CHOLE LZT030E (CUSTOM PROCEDURE TRAY) ×2 IMPLANT
PAD ARMBOARD 7.5X6 YLW CONV (MISCELLANEOUS) ×2 IMPLANT
POUCH SPECIMEN RETRIEVAL 10MM (ENDOMECHANICALS) ×2 IMPLANT
RELOAD 45 VASCULAR/THIN (ENDOMECHANICALS) IMPLANT
RELOAD STAPLE TA45 3.5 REG BLU (ENDOMECHANICALS) IMPLANT
SEALER TISSUE G2 CVD JAW 35 (ENDOMECHANICALS) ×1 IMPLANT
SEALER TISSUE G2 CVD JAW 45CM (ENDOMECHANICALS) ×1
SET BASIN LINEN APH (SET/KITS/TRAYS/PACK) ×2 IMPLANT
SET TUBE IRRIG SUCTION NO TIP (IRRIGATION / IRRIGATOR) IMPLANT
SLEEVE Z-THREAD 5X100MM (TROCAR) ×2 IMPLANT
SPONGE GAUZE 2X2 8PLY STRL LF (GAUZE/BANDAGES/DRESSINGS) ×2 IMPLANT
STRIP CLOSURE SKIN 1/2X4 (GAUZE/BANDAGES/DRESSINGS) ×2 IMPLANT
SUT MNCRL AB 4-0 PS2 18 (SUTURE) ×2 IMPLANT
SUT VIC AB 2-0 CT2 27 (SUTURE) ×2 IMPLANT
TAPE CLOTH SURG 4X10 WHT LF (GAUZE/BANDAGES/DRESSINGS) ×2 IMPLANT
TRAY FOLEY CATH 14FR (SET/KITS/TRAYS/PACK) ×2 IMPLANT
TROCAR Z-THRD FIOS HNDL 11X100 (TROCAR) ×2 IMPLANT
TROCAR Z-THREAD FIOS 5X100MM (TROCAR) ×2 IMPLANT
TROCAR Z-THREAD SLEEVE 11X100 (TROCAR) ×2 IMPLANT
WARMER LAPAROSCOPE (MISCELLANEOUS) ×2 IMPLANT

## 2011-07-28 NOTE — Transfer of Care (Signed)
Immediate Anesthesia Transfer of Care Note  Patient: [name]  Procedure(s) Performed: Procedure(s) (LRB): APPENDECTOMY LAPAROSCOPIC (N/A)  Patient Location: PACU  Anesthesia Type: General  Level of Consciousness: awake, alert , oriented and patient cooperative  Airway & Oxygen Therapy: Patient Spontanous Breathing and Patient connected to face mask oxygen  Post-op Assessment: Report given to PACU RN and Post -op Vital signs reviewed and stable  Post vital signs: Reviewed and stable  Complications: No apparent anesthesia complications

## 2011-07-28 NOTE — Anesthesia Postprocedure Evaluation (Signed)
  Anesthesia Post-op Note  Patient: [name]  Procedure(s) Performed: Procedure(s) (LRB): APPENDECTOMY LAPAROSCOPIC (N/A)  Patient Location: PACU  Anesthesia Type: General  Level of Consciousness: awake, alert , oriented and patient cooperative  Airway and Oxygen Therapy: Patient Spontanous Breathing and Patient connected to face mask oxygen  Post-op Pain: mild  Post-op Assessment: Post-op Vital signs reviewed, Patient's Cardiovascular Status Stable, Respiratory Function Stable, Patent Airway and No signs of Nausea or vomiting  Post-op Vital Signs: Reviewed and stable  Complications: No apparent anesthesia complications

## 2011-07-28 NOTE — Anesthesia Procedure Notes (Signed)
Date/Time: 07/28/2011 4:46 PM Performed by: Carolyne Littles, Markie Heffernan L Pre-anesthesia Checklist: Patient identified, Patient being monitored, Emergency Drugs available, Timeout performed and Suction available Patient Re-evaluated:Patient Re-evaluated prior to inductionOxygen Delivery Method: Circle system utilized Preoxygenation: Pre-oxygenation with 100% oxygen Intubation Type: IV induction, Rapid sequence and Cricoid Pressure applied Laryngoscope Size: Miller and 3 Grade View: Grade I Tube type: Oral Tube size: 7.0 mm Number of attempts: 1 Placement Confirmation: ETT inserted through vocal cords under direct vision,  positive ETCO2 and breath sounds checked- equal and bilateral Secured at: 23 cm Tube secured with: Tape Dental Injury: Teeth and Oropharynx as per pre-operative assessment

## 2011-07-28 NOTE — H&P (Signed)
[name] is an 46 y.o. male.   Chief Complaint: Abdominal pain HPI: Patient presents to [facility] with less than 24 hours of right-sided abdominal pain. Pain actually started periumbilically and localized to the lower quadrants. When asked patient does describe the pain mostly on the right side but describes it as diffuse. Pain is worse with movement. Appetite has decreased. He has had associated fever and chills. He has had associated nausea and not bloody emesis. No change in bowel movements. Last time was yesterday and was normal. No melena no hematochezia. No sick contacts. No similar to pathology the past.  Past Medical History  Diagnosis Date  . Diabetes mellitus   . Hypertension   . Asthma   . Pancreatitis     Past Surgical History  Procedure Date  . Appendectomy   . Leg surgery     No family history on file. Social History:  reports that he has never smoked. He does not have any smokeless tobacco history on file. He reports that he drinks alcohol. He reports that he does not use illicit drugs.  Allergies:  Allergies  Allergen Reactions  . Aspirin Other (See Comments)    Patient says he's a free bleeder.   . Penicillins Hives    Medications Prior to Admission  Medication Dose Route Frequency Provider Last Rate Last Dose  . 0.9 %  sodium chloride infusion   Intravenous Continuous Carleene Cooper III, MD 250 mL/hr at 07/28/11 1134    . ertapenem (INVANZ) 1 g in sodium chloride 0.9 % 50 mL IVPB  1 g Intravenous Once Carleene Cooper III, MD      . HYDROmorphone (DILAUDID) injection 1 mg  1 mg Intravenous Once Carleene Cooper III, MD   1 mg at 07/28/11 1134  . HYDROmorphone (DILAUDID) injection 1 mg  1 mg Intravenous Once Carleene Cooper III, MD   1 mg at 07/28/11 1246  . HYDROmorphone (DILAUDID) injection 1 mg  1 mg Intravenous Once Carleene Cooper III, MD   1 mg at 07/28/11 1516  . iohexol (OMNIPAQUE) 300 MG/ML solution 100 mL  100 mL Intravenous Once PRN Medication  Radiologist, MD   100 mL at 07/28/11 1338  . ondansetron (ZOFRAN) injection 4 mg  4 mg Intravenous Once Carleene Cooper III, MD   4 mg at 07/28/11 1134   Medications Prior to Admission  Medication Sig Dispense Refill  . ARIPiprazole (ABILIFY) 15 MG tablet Take 15 mg by mouth at bedtime.       . Fluticasone-Salmeterol (ADVAIR) 250-50 MCG/DOSE AEPB Inhale 1 puff into the lungs 2 (two) times daily as needed. Asthma      . PARoxetine (PAXIL-CR) 25 MG 24 hr tablet Take 25 mg by mouth every morning.          Results for orders placed during the hospital encounter of 07/28/11 (from the past 48 hour(s))  CBC     Status: Abnormal   Collection Time   07/28/11 10:49 AM      Component Value Range Comment   WBC 14.9 (*) 4.0 - 10.5 (K/uL)    RBC 4.74  4.22 - 5.81 (MIL/uL)    Hemoglobin 15.2  13.0 - 17.0 (g/dL)    HCT 45.4  09.8 - 11.9 (%)    MCV 89.5  78.0 - 100.0 (fL)    MCH 32.1  26.0 - 34.0 (pg)    MCHC 35.8  30.0 - 36.0 (g/dL)    RDW 14.7  82.9 - 56.2 (%)  Platelets 145 (*) 150 - 400 (K/uL)   DIFFERENTIAL     Status: Abnormal   Collection Time   07/28/11 10:49 AM      Component Value Range Comment   Neutrophils Relative 83 (*) 43 - 77 (%)    Neutro Abs 12.4 (*) 1.7 - 7.7 (K/uL)    Lymphocytes Relative 11 (*) 12 - 46 (%)    Lymphs Abs 1.7  0.7 - 4.0 (K/uL)    Monocytes Relative 5  3 - 12 (%)    Monocytes Absolute 0.7  0.1 - 1.0 (K/uL)    Eosinophils Relative 1  0 - 5 (%)    Eosinophils Absolute 0.1  0.0 - 0.7 (K/uL)    Basophils Relative 0  0 - 1 (%)    Basophils Absolute 0.0  0.0 - 0.1 (K/uL)   BASIC METABOLIC PANEL     Status: Abnormal   Collection Time   07/28/11 10:49 AM      Component Value Range Comment   Sodium 130 (*) 135 - 145 (mEq/L)    Potassium 4.0  3.5 - 5.1 (mEq/L)    Chloride 91 (*) 96 - 112 (mEq/L)    CO2 28  19 - 32 (mEq/L)    Glucose, Bld 327 (*) 70 - 99 (mg/dL)    BUN 7  6 - 23 (mg/dL)    Creatinine, Ser 4.78  0.50 - 1.35 (mg/dL)    Calcium 29.5  8.4 - 10.5 (mg/dL)     GFR calc non Af Amer >90  >90 (mL/min)    GFR calc Af Amer >90  >90 (mL/min)   LIPASE, BLOOD     Status: Normal   Collection Time   07/28/11 10:49 AM      Component Value Range Comment   Lipase 12  11 - 59 (U/L)   ETHANOL     Status: Normal   Collection Time   07/28/11 10:49 AM      Component Value Range Comment   Alcohol, Ethyl (B) <11  0 - 11 (mg/dL)   URINALYSIS, ROUTINE W REFLEX MICROSCOPIC     Status: Abnormal   Collection Time   07/28/11 11:47 AM      Component Value Range Comment   Color, Urine YELLOW  YELLOW     APPearance CLEAR  CLEAR     Specific Gravity, Urine 1.010  1.005 - 1.030     pH 7.5  5.0 - 8.0     Glucose, UA >1000 (*) NEGATIVE (mg/dL)    Hgb urine dipstick NEGATIVE  NEGATIVE     Bilirubin Urine NEGATIVE  NEGATIVE     Ketones, ur NEGATIVE  NEGATIVE (mg/dL)    Protein, ur NEGATIVE  NEGATIVE (mg/dL)    Urobilinogen, UA 1.0  0.0 - 1.0 (mg/dL)    Nitrite NEGATIVE  NEGATIVE     Leukocytes, UA NEGATIVE  NEGATIVE    URINE MICROSCOPIC-ADD ON     Status: Normal   Collection Time   07/28/11 11:47 AM      Component Value Range Comment   WBC, UA 0-2  <3 (WBC/hpf)    Ct Abdomen Pelvis W Contrast  07/28/2011  *RADIOLOGY REPORT*  Clinical Data: Abdominal pain, nausea and vomiting.  CT ABDOMEN AND PELVIS WITH CONTRAST  Technique:  Multidetector CT imaging of the abdomen and pelvis was performed following the standard protocol during bolus administration of intravenous contrast.  Contrast: OMNIPAQUE IOHEXOL 300 MG/ML  SOLN  Comparison: None.  Findings: The lung bases  are clear.  The solid abdominal organs are unremarkable.  No focal lesions. The gallbladder is normal.  No common bile duct dilatation. Prominent fatty interstices involving the pancreas.  The stomach, duodenum, small bowel and colon are unremarkable.  No inflammatory changes or mass lesions.  The appendix is distended, thickened and inflamed consistent with acute appendicitis.  It is retrocecal and extends back up  toward the liver edge.  The bladder, prostate gland and seminal vesicles are unremarkable. No pelvic mass, adenopathy or free pelvic fluid collections.  No inguinal mass or hernia.  Bony structures are intact.  IMPRESSION: CT findings consistent with acute appendicitis.  Original Report Authenticated By: P. Loralie Champagne, M.D.    Review of Systems  Constitutional: Positive for fever, chills and malaise/fatigue.  HENT: Negative.   Eyes: Negative.   Respiratory: Negative.   Cardiovascular: Negative.   Gastrointestinal: Positive for heartburn, nausea, vomiting and abdominal pain (diffuse but mostly right-sided). Negative for diarrhea, constipation, blood in stool and melena.  Genitourinary: Negative.   Musculoskeletal: Negative.   Skin: Negative.   Neurological: Negative.   Endo/Heme/Allergies: Negative.   Psychiatric/Behavioral: Negative.     Blood pressure 136/91, pulse 109, temperature 98.4 F (36.9 C), temperature source Oral, resp. rate 20, height 6' (1.829 m), weight 127.007 kg (280 lb), SpO2 94.00%. Physical Exam  Constitutional: He is oriented to person, place, and time. He appears well-developed and well-nourished. No distress.       Disheveled  HENT:  Head: Normocephalic and atraumatic.  Eyes: Conjunctivae and EOM are normal. Pupils are equal, round, and reactive to light. No scleral icterus.  Neck: Normal range of motion. Neck supple. No tracheal deviation present. No thyromegaly present.  Cardiovascular: Regular rhythm.        Tachycardic  Respiratory: Effort normal and breath sounds normal. No respiratory distress. He has no wheezes.  GI: Soft. He exhibits distension. He exhibits no mass. There is tenderness (right-sided mostly right upper quadrant. Pain referred around to the flank. Positive referred pain to the right flank. No diffuse peritoneal signs.). There is guarding. There is no rebound.       Obese  Lymphadenopathy:    He has no cervical adenopathy.    Neurological: He is alert and oriented to person, place, and time.  Skin: Skin is warm and dry.     Assessment/Plan Acute appendicitis. CT findings were discussed at length the patient. Interestingly his appendix appears to be up behind the right lobe the liver. This could relate to his symptomatology not being classic for appendicitis. Risks benefits alternatives of emergent laparoscopic possible open appendectomy were discussed at length patient including but not limited to risk of bleeding, infection, appendiceal stump leak, intraoperative cardiac and pulmonary events. Patient does have a history of alcohol use but denies any heavy use. No history of DTs.  Will plan to proceed for emergent appendectomy.  Jaylaa Gallion C 07/28/2011, 3:48 PM

## 2011-07-28 NOTE — Anesthesia Preprocedure Evaluation (Addendum)
Anesthesia Evaluation  Patient identified by MRN, date of birth, ID band Patient awake    Reviewed: Allergy & Precautions, H&P , NPO status , Patient's Chart, lab work & pertinent test results, reviewed documented beta blocker date and time   Airway Mallampati: II TM Distance: >3 FB Neck ROM: full    Dental  (+) Missing and Dental Advidsory Given   Pulmonary asthma ,    + decreased breath sounds      Cardiovascular Exercise Tolerance: Poor hypertension, Rhythm:regular Rate:Normal  Untreated Hypertension   Neuro/Psych    GI/Hepatic Patient received Oral Contrast Agents,Patient reports drinking contrast around 12 noon.   Endo/Other  Diabetes mellitus-, Poorly Controlled, Type 2  Renal/GU      Musculoskeletal   Abdominal   Peds  Hematology Patient states he is a "free bleeder"   Anesthesia Other Findings   Reproductive/Obstetrics                           Anesthesia Physical Anesthesia Plan  ASA: III and Emergent  Anesthesia Plan: General ETT, Rapid Sequence and Cricoid Pressure   Post-op Pain Management:    Induction:   Airway Management Planned: Oral ETT  Additional Equipment:   Intra-op Plan:   Post-operative Plan:   Informed Consent: I have reviewed the patients History and Physical, chart, labs and discussed the procedure including the risks, benefits and alternatives for the proposed anesthesia with the patient or authorized representative who has indicated his/her understanding and acceptance.   Dental Advisory Given  Plan Discussed with: Anesthesiologist and Surgeon  Anesthesia Plan Comments:       Anesthesia Quick Evaluation

## 2011-07-28 NOTE — ED Notes (Signed)
Pt c/o lower abd pain with n/v/d since early this am.  Unsure if has had fever.  Reports history pancreatitis.  Denies history of kidney stone.

## 2011-07-28 NOTE — ED Notes (Signed)
Pt requesting something for pain.  edp notified.  

## 2011-07-28 NOTE — ED Notes (Signed)
Skin color returned to pink, no longer diaphoretic.  Pt resting comfortably with eyes closed upon entering room.  nad noted.

## 2011-07-28 NOTE — ED Provider Notes (Signed)
History   This chart was scribed for William Cooper III, MD scribed by Magnus Sinning. The patient was seen in room APA19/APA19 seen at 1120.   CSN: [id number]  Arrival date & time 07/28/11  1016   First MD Initiated Contact with Patient 07/28/11 1102      Chief Complaint  Patient presents with  . Abdominal Pain    (Consider location/radiation/quality/duration/timing/severity/associated sxs/prior treatment) HPI [name] is a 46 y.o. male who presents to the Emergency Department complaining of constant moderate lower abd pain with associated vomitng and diarrhea, onset this morning. Pt says he has not used any at home treatments. Pt has hx of pancreatitis in 2008 and says sx's feel similar to previous pancreatitis. Denies drinking recently, or any major recent surgeries. Pt also has hx of asthma, possible high BP, and DM. Pt is not a current smoker and denies drug use.  Past Medical History  Diagnosis Date  . Diabetes mellitus   . Hypertension   . Asthma   . Pancreatitis     Past Surgical History  Procedure Date  . Appendectomy   . Leg surgery     No family history on file.  History  Substance Use Topics  . Smoking status: Never Smoker   . Smokeless tobacco: Not on file  . Alcohol Use: Yes     occasionally     Review of Systems  Gastrointestinal: Positive for vomiting, abdominal pain and diarrhea.  All other systems reviewed and are negative.    Allergies  Aspirin and Penicillins  Home Medications   Current Outpatient Rx  Name Route Sig Dispense Refill  . ALBUTEROL SULFATE HFA 108 (90 BASE) MCG/ACT IN AERS Inhalation Inhale 2 puffs into the lungs every 6 (six) hours as needed. Asthma    . ARIPIPRAZOLE 15 MG PO TABS Oral Take 15 mg by mouth at bedtime.     Marland Kitchen DIAZEPAM 10 MG PO TABS Oral Take 10 mg by mouth 4 (four) times daily.    Marland Kitchen FLUTICASONE-SALMETEROL 250-50 MCG/DOSE IN AEPB Inhalation Inhale 1 puff into the lungs 2 (two) times daily as needed. Asthma     . PAROXETINE HCL ER 25 MG PO TB24 Oral Take 25 mg by mouth every morning.        BP 154/98  Pulse 114  Temp(Src) 100 F (37.8 C) (Oral)  Resp 22  Ht 6' (1.829 m)  Wt 280 lb (127.007 kg)  BMI 37.97 kg/m2  SpO2 95%  Physical Exam  Nursing note and vitals reviewed. Constitutional: He is oriented to person, place, and time. He appears well-developed and well-nourished. No distress.  HENT:  Head: Normocephalic and atraumatic.  Mouth/Throat: Oropharynx is clear and moist.       No jaundice noted.  Eyes: EOM are normal. Pupils are equal, round, and reactive to light.  Neck: Neck supple. No tracheal deviation present.  Cardiovascular: Normal rate.   Pulmonary/Chest: Effort normal. No respiratory distress.  Abdominal: Soft. He exhibits distension. He exhibits no mass. There is tenderness.       Mid abd tendeness to palpation but no mass.  Musculoskeletal: Normal range of motion. He exhibits no edema.  Neurological: He is alert and oriented to person, place, and time. No sensory deficit.       Neurologically intact  Skin: Skin is warm. He is diaphoretic.  Psychiatric: He has a normal mood and affect. His behavior is normal.    ED Course  Procedures (including critical care time) DIAGNOSTIC STUDIES:  Oxygen Saturation is 95% on room air, normal by my interpretation.    COORDINATION OF CARE:  Medication Orders 1130: DILAUDID injection 1 mg Once  ZOFRAN injection 4 mg Once  0.9 % Sodium chloride infusion Continuous   Labs Reviewed  CBC - Abnormal; Notable for the following:    WBC 14.9 (*)    Platelets 145 (*)    All other components within normal limits  DIFFERENTIAL - Abnormal; Notable for the following:    Neutrophils Relative 83 (*)    Neutro Abs 12.4 (*)    Lymphocytes Relative 11 (*)    All other components within normal limits  BASIC METABOLIC PANEL - Abnormal; Notable for the following:    Sodium 130 (*)    Chloride 91 (*)    Glucose, Bld 327 (*)    All other  components within normal limits  URINALYSIS, ROUTINE W REFLEX MICROSCOPIC - Abnormal; Notable for the following:    Glucose, UA >1000 (*)    All other components within normal limits  LIPASE, BLOOD  ETHANOL  URINE MICROSCOPIC-ADD ON   Ct Abdomen Pelvis W Contrast  07/28/2011  *RADIOLOGY REPORT*  Clinical Data: Abdominal pain, nausea and vomiting.  CT ABDOMEN AND PELVIS WITH CONTRAST  Technique:  Multidetector CT imaging of the abdomen and pelvis was performed following the standard protocol during bolus administration of intravenous contrast.  Contrast: OMNIPAQUE IOHEXOL 300 MG/ML  SOLN  Comparison: None.  Findings: The lung bases are clear.  The solid abdominal organs are unremarkable.  No focal lesions. The gallbladder is normal.  No common bile duct dilatation. Prominent fatty interstices involving the pancreas.  The stomach, duodenum, small bowel and colon are unremarkable.  No inflammatory changes or mass lesions.  The appendix is distended, thickened and inflamed consistent with acute appendicitis.  It is retrocecal and extends back up toward the liver edge.  The bladder, prostate gland and seminal vesicles are unremarkable. No pelvic mass, adenopathy or free pelvic fluid collections.  No inguinal mass or hernia.  Bony structures are intact.  IMPRESSION: CT findings consistent with acute appendicitis.  Original Report Authenticated By: P. Loralie Champagne, M.D.   3:09 PM Lab workup shows WBC 14,900 and CT abdomen and pelvis shows acute appendicitis.    1. Acute appendicitis    3:20 PM Spoke to Dr. Leticia Penna, who will admit pt.  Call to pharmacist, as pt had hives with penicillin; was advised that there was a small chance of an adverse reaction but that if it occurred it could be managed.    I personally performed the services described in this documentation, which was scribed in my presence. The recorded information has been reviewed and considered.  Osvaldo Human, M.D.  William Cooper III, MD 07/28/11 [id number]

## 2011-07-28 NOTE — Progress Notes (Signed)
Pt states that he takes no meds for his diabetes or HTN. States has no money to buy his meds.

## 2011-07-29 MED ORDER — OXYCODONE-ACETAMINOPHEN 5-325 MG PO TABS
1.0000 | ORAL_TABLET | ORAL | Status: DC | PRN
  Administered 2011-07-29 – 2011-07-30 (×6): 2 via ORAL
  Filled 2011-07-29 (×6): qty 2

## 2011-07-29 NOTE — Progress Notes (Signed)
Patient ambulated in hallway 3 times today. Tolerated well. Has been taking PO pain medication only during this shift. Patient and wife seem anxious to get discharged home.

## 2011-07-29 NOTE — Addendum Note (Signed)
Addendum  created 07/29/11 1027 by Despina Hidden, CRNA   Modules edited:Notes Section

## 2011-07-29 NOTE — Progress Notes (Signed)
1 Day Post-Op  Subjective: Patient with poorly controlled abdominal pain.  No nausea.  Tolerating diet.  Objective: Vital signs in last 24 hours: Temp:  [97.2 F (36.2 C)-98.2 F (36.8 C)] 97.4 F (36.3 C) (04/10 1757) Pulse Rate:  [92-109] 109  (04/10 1757) Resp:  [18-20] 20  (04/10 1757) BP: (131-158)/(77-88) 142/83 mmHg (04/10 1757) SpO2:  [91 %-95 %] 95 % (04/10 2117) Last BM Date: 07/29/11  Intake/Output from previous day: 04/09 0701 - 04/10 0700 In: 1100 [I.V.:1100] Out: 1010 [Urine:1000; Blood:10] Intake/Output this shift:    General appearance: alert and no distress GI: Soft, obese, moderate anticipated tenderness.  No peritoneal signs.  Incisions c/d/i.    Lab Results:   Basename 07/28/11 1049  WBC 14.9*  HGB 15.2  HCT 42.4  PLT 145*   BMET  Basename 07/28/11 1049  NA 130*  K 4.0  CL 91*  CO2 28  GLUCOSE 327*  BUN 7  CREATININE 0.60  CALCIUM 10.2   PT/INR No results found for this basename: LABPROT:2,INR:2 in the last 72 hours ABG No results found for this basename: PHART:2,PCO2:2,PO2:2,HCO3:2 in the last 72 hours  Studies/Results: Ct Abdomen Pelvis W Contrast  07/28/2011  *RADIOLOGY REPORT*  Clinical Data: Abdominal pain, nausea and vomiting.  CT ABDOMEN AND PELVIS WITH CONTRAST  Technique:  Multidetector CT imaging of the abdomen and pelvis was performed following the standard protocol during bolus administration of intravenous contrast.  Contrast: OMNIPAQUE IOHEXOL 300 MG/ML  SOLN  Comparison: None.  Findings: The lung bases are clear.  The solid abdominal organs are unremarkable.  No focal lesions. The gallbladder is normal.  No common bile duct dilatation. Prominent fatty interstices involving the pancreas.  The stomach, duodenum, small bowel and colon are unremarkable.  No inflammatory changes or mass lesions.  The appendix is distended, thickened and inflamed consistent with acute appendicitis.  It is retrocecal and extends back up toward the  liver edge.  The bladder, prostate gland and seminal vesicles are unremarkable. No pelvic mass, adenopathy or free pelvic fluid collections.  No inguinal mass or hernia.  Bony structures are intact.  IMPRESSION: CT findings consistent with acute appendicitis.  Original Report Authenticated By: P. Loralie Champagne, M.D.    Anti-infectives: Anti-infectives     Start     Dose/Rate Route Frequency Ordered Stop   07/28/11 1600   metroNIDAZOLE (FLAGYL) IVPB 500 mg        500 mg 100 mL/hr over 60 Minutes Intravenous 4 times per day 07/28/11 1549 07/28/11 1701   07/28/11 1549   ciprofloxacin (CIPRO) IVPB 400 mg        400 mg 200 mL/hr over 60 Minutes Intravenous 120 min pre-op 07/28/11 1549 07/28/11 1701   07/28/11 1530   ertapenem (INVANZ) 1 g in sodium chloride 0.9 % 50 mL IVPB  Status:  Discontinued        1 g 100 mL/hr over 30 Minutes Intravenous  Once 07/28/11 1519 07/28/11 1551          Assessment/Plan: s/p Procedure(s) (LRB): APPENDECTOMY LAPAROSCOPIC (N/A) Change to percocet.  INcrease activity.  Plan d/c in next 24 hours.  LOS: 1 day    Morrisa Aldaba C 07/29/2011

## 2011-07-29 NOTE — Anesthesia Postprocedure Evaluation (Signed)
  Anesthesia Post-op Note  Patient: [name]  Procedure(s) Performed: Procedure(s) (LRB): APPENDECTOMY LAPAROSCOPIC (N/A)  Patient Location: room 327  Anesthesia Type: General  Level of Consciousness: awake, alert , oriented and patient cooperative  Airway and Oxygen Therapy: Patient Spontanous Breathing  Post-op Pain: 8 /10, severe  Post-op Assessment: Post-op Vital signs reviewed, Patient's Cardiovascular Status Stable, Respiratory Function Stable, Patent Airway and No signs of Nausea or vomiting  Post-op Vital Signs: Reviewed and stable  Complications: No apparent anesthesia complications

## 2011-07-30 ENCOUNTER — Encounter (HOSPITAL_COMMUNITY): Payer: Self-pay | Admitting: General Surgery

## 2011-07-30 MED ORDER — OXYCODONE-ACETAMINOPHEN 5-325 MG PO TABS: 1.0000 | ORAL_TABLET | ORAL | Status: AC | PRN

## 2011-07-30 NOTE — Progress Notes (Signed)
Pt up ambulating in hall, tolerating diet advanced to regular diet.

## 2011-07-31 NOTE — Op Note (Signed)
Patient:  [name]  DOB:  July 18, 1965  MRN:  [id number]   Preop Diagnosis:  Acute appendicitis  Postop Diagnosis:  The same  Procedure:  Laparoscopic appendectomy  Surgeon:  Dr. Tilford Pillar  Anes:  General endotracheal, 0.5% Sensorcaine plain  Indications:  Patient is a 46 year old male presented to [facility] with increasing abdominal pain. Workup and evaluation was consistent for acute appendicitis. It is noted on CT is appendix is behind the right lobe of the liver. Risks benefits alternatives of a laparoscopic possible open appendectomy were discussed at length patient including but not limited to risk of bleeding, infection, appendiceal stump leak, intraoperative I. component events. Patient's questions and concerns were addressed the patient is taken for emergent appendectomy.  Procedure note:  Patient was taken to the or is placed in supine position on the or tails of the general anesthetic is administered. Once patient was asleep he was endotracheally and a by the nurse anesthetist. At this point a Foley catheter is placed in standard sterile fashion by the operative staff. His abdomen is prepped with DuraPrep solution. Drapes are placed in standard fashion. A supraumbilical stab incision created with a 15 blade scalpel and digital dissection down to subcuticular tissues carried using a Coker clamp was utilized as anti-all fashion with this anteriorly. A Veress needle is inserted saline drop test is utilized confirm intraperitoneal placement the pneumoperitoneum was initiated. Once sufficient pneumoperitoneum was obtained 11 mm was inserted over laparoscopic line visualization the trocar entering the peritoneal cavity. At this point the inner cannulas removed lap scope was reinserted there is no the same Veress or trocar placement injury. At this time the remaining trochars replaced with a 12 mm trocar in the midline and the upper abdomen near the epigastric region. A 5 mm  trocar was placed in the right lateral bowel wall.   An additional 5 mm trocar was placed into the right lateral abdominal wall to help with exposure. These are placed under direct visualization. Patient was placed into a reverse Trendelenburg left lateral decubitus position. The cecum was identified near the lower edge of the right lobe of the liver. The tinea where followed to the base of the appendix which was noted to be set behind the right lobe liver. The appendix was grasped and lifted anteriorly. A window was created between the appendix and mesoappendix at its space. An Endo GIA 35 stapler was utilized to divide the base the appendix. The mesoappendix was then divided with the Enseal bipolar device. At this point the appendix is free is placed into an Endo Catch bag was placed into the right lower quadrant. Inspection of the staple and indicate excellent closure. The mesoappendix was inspected there is no is any active bleeding. At this point her my attention to closure.  Using Endo Close suture passing device a 2-0 Vicryl sutures passed both the 12 and 11 mm trocar sites. With the sutures in place the appendix was retrieved was removed through the 12 mm car site and intact Endo Catch bag. It was placed in the back table sent as per specimen to pathology. At this time the pneumoperitoneum was evacuated. Trochars were removed. The Vicryl sutures were secured. The local anesthetic was instilled. A 4-0 Monocryl utilized reapproximate the skin edges at all 4 trocar sites. The skin was washed dried moist dry towel.  Benzoin is applied around incision. Half-inch Steri-Strips are placed. The drapes removed the patient was allowed to mild general anesthetic and stretcher the  PACU in stable condition. At the conclusion of procedure all instrument, sponge, needle counts are correct. Patient tolerated procedure she may well.  Complications:  None apparent  EBL:  Minimal  Specimen:  Appendix

## 2011-09-09 NOTE — Discharge Summary (Signed)
Physician Discharge Summary  Patient ID: [name] MRN: [id number] DOB/AGE: 1965/06/13 46 y.o.  Admit date: 07/28/2011 Discharge date: 07/29/2011  Admission Diagnoses: Acute appendicitis  Discharge Diagnoses: The same Active Problems:  * No active hospital problems. *    Discharged Condition: stable  Hospital Course: Patient presented to [facility] emergency department with right lower quadrant abdominal pain. Workup and evaluation was consistent for acute appendicitis. Patient was taken to the operating room on the same date. He underwent a laparoscopic appendectomy which she tolerated extremely well. Due to the timing of this procedure he was admitted for observation overnight. Patient was advanced on his diet. On the following day patient was tolerating a regular diet. Pain was controlled. Patient was able toward. Plans were made for discharge.  Consults: None  Significant Diagnostic Studies: radiology: CT scan: Abdomen and pelvis  Treatments: surgery: Laparoscopic appendectomy  Discharge Exam: Blood pressure 143/79, pulse 89, temperature 97.9 F (36.6 C), temperature source Oral, resp. rate 20, height 6' (1.829 m), weight 127.007 kg (280 lb), SpO2 93.00%. General appearance: alert and no distress Resp: clear to auscultation bilaterally Cardio: regular rate and rhythm GI: Soft, flat, expected postoperative tenderness. Incisions are clean dry and intact. No peritoneal signs the  Disposition: 01-Home or Self Care  Discharge Orders    Future Orders Please Complete By Expires   Diet - low sodium heart healthy      Increase activity slowly      Discharge instructions      Comments:   Increase activity as tolerated. May place ice pack for comfort.  Alternate an anti-inflammatory such as ibuprofen (Motrin, Advil) 400-600mg  every 6 hours with the prescribed pain medication.   Do not take any additional acetaminophen as there is Tylenol in the pain medication.    Driving Restrictions      Comments:   No driving while on pain medications.    Lifting restrictions      Comments:   No lifting over 20lbs for 4-5 weeks post-op.    Discharge wound care:      Comments:   Clean surgical sites with soap and water.  May shower the morning after surgery unless instructed by Dr. Leticia Penna otherwise.  No soaking for 2-3 weeks.    If adhesive strips are in place, they may be removed in 1-2 weeks while in the shower.    Call MD for:  temperature >100.4      Call MD for:  persistant nausea and vomiting      Call MD for:  severe uncontrolled pain      Call MD for:  redness, tenderness, or signs of infection (pain, swelling, redness, odor or green/yellow discharge around incision site)        Medication List  As of 09/09/2011 10:18 AM   TAKE these medications         albuterol 108 (90 BASE) MCG/ACT inhaler   Commonly known as: PROVENTIL HFA;VENTOLIN HFA   Inhale 2 puffs into the lungs every 6 (six) hours as needed. Asthma      ARIPiprazole 15 MG tablet   Commonly known as: ABILIFY   Take 15 mg by mouth at bedtime.      diazepam 10 MG tablet   Commonly known as: VALIUM   Take 10 mg by mouth 4 (four) times daily.      Fluticasone-Salmeterol 250-50 MCG/DOSE Aepb   Commonly known as: ADVAIR   Inhale 1 puff into the lungs 2 (two) times daily  as needed. Asthma      PARoxetine 25 MG 24 hr tablet   Commonly known as: PAXIL-CR   Take 25 mg by mouth every morning.           Follow-up Information    Follow up with Raygen Dahm C, MD in 3 weeks.   Contact information:   [address] [place] [address] [phone]          Signed: Fabio Bering 09/09/2011, 10:18 AM

## 2014-09-07 ENCOUNTER — Other Ambulatory Visit (HOSPITAL_COMMUNITY): Payer: Self-pay | Admitting: Podiatry

## 2014-09-07 DIAGNOSIS — Z9889 Other specified postprocedural states: Secondary | ICD-10-CM

## 2014-09-12 ENCOUNTER — Encounter (HOSPITAL_COMMUNITY): Payer: Medicaid Other

## 2014-09-12 ENCOUNTER — Ambulatory Visit (HOSPITAL_COMMUNITY): Payer: Medicaid Other

## 2014-09-20 ENCOUNTER — Encounter (HOSPITAL_COMMUNITY): Payer: Medicaid Other

## 2014-09-20 ENCOUNTER — Ambulatory Visit (HOSPITAL_COMMUNITY): Payer: Medicaid Other

## 2014-09-21 ENCOUNTER — Encounter (HOSPITAL_COMMUNITY)
Admission: RE | Admit: 2014-09-21 | Discharge: 2014-09-21 | Disposition: A | Discharge status: Home / Self Care | Payer: Medicaid Other | Source: Ambulatory Visit | Attending: Podiatry | Admitting: Podiatry

## 2014-09-21 ENCOUNTER — Encounter (HOSPITAL_COMMUNITY): Payer: Self-pay

## 2014-09-21 DIAGNOSIS — Z9889 Other specified postprocedural states: Secondary | ICD-10-CM | POA: Insufficient documentation

## 2014-09-21 DIAGNOSIS — M79671 Pain in right foot: Secondary | ICD-10-CM | POA: Insufficient documentation

## 2014-09-21 MED ORDER — TECHNETIUM TC 99M EXAMETAZIME IV KIT
10.0000 | PACK | Freq: Once | INTRAVENOUS | Status: AC | PRN
  Administered 2014-09-21: 10 via INTRAVENOUS

## 2015-01-03 ENCOUNTER — Encounter (HOSPITAL_BASED_OUTPATIENT_CLINIC_OR_DEPARTMENT_OTHER): Payer: Medicaid Other | Attending: Internal Medicine

## 2015-01-03 DIAGNOSIS — D649 Anemia, unspecified: Secondary | ICD-10-CM | POA: Insufficient documentation

## 2015-01-03 DIAGNOSIS — M069 Rheumatoid arthritis, unspecified: Secondary | ICD-10-CM | POA: Diagnosis not present

## 2015-01-03 DIAGNOSIS — I10 Essential (primary) hypertension: Secondary | ICD-10-CM | POA: Diagnosis not present

## 2015-01-03 DIAGNOSIS — Z87891 Personal history of nicotine dependence: Secondary | ICD-10-CM | POA: Insufficient documentation

## 2015-01-03 DIAGNOSIS — M1990 Unspecified osteoarthritis, unspecified site: Secondary | ICD-10-CM | POA: Insufficient documentation

## 2015-01-03 DIAGNOSIS — L97523 Non-pressure chronic ulcer of other part of left foot with necrosis of muscle: Secondary | ICD-10-CM | POA: Insufficient documentation

## 2015-01-03 DIAGNOSIS — I252 Old myocardial infarction: Secondary | ICD-10-CM | POA: Diagnosis not present

## 2015-01-03 DIAGNOSIS — E1142 Type 2 diabetes mellitus with diabetic polyneuropathy: Secondary | ICD-10-CM | POA: Insufficient documentation

## 2015-01-03 DIAGNOSIS — E11622 Type 2 diabetes mellitus with other skin ulcer: Secondary | ICD-10-CM | POA: Diagnosis not present

## 2015-01-03 DIAGNOSIS — L97511 Non-pressure chronic ulcer of other part of right foot limited to breakdown of skin: Secondary | ICD-10-CM | POA: Diagnosis not present

## 2015-01-03 DIAGNOSIS — J45909 Unspecified asthma, uncomplicated: Secondary | ICD-10-CM | POA: Diagnosis not present

## 2015-01-10 DIAGNOSIS — L97523 Non-pressure chronic ulcer of other part of left foot with necrosis of muscle: Secondary | ICD-10-CM | POA: Diagnosis not present

## 2015-01-10 DIAGNOSIS — L97511 Non-pressure chronic ulcer of other part of right foot limited to breakdown of skin: Secondary | ICD-10-CM | POA: Diagnosis not present

## 2015-01-10 DIAGNOSIS — E11622 Type 2 diabetes mellitus with other skin ulcer: Secondary | ICD-10-CM | POA: Diagnosis not present

## 2015-01-10 DIAGNOSIS — E1142 Type 2 diabetes mellitus with diabetic polyneuropathy: Secondary | ICD-10-CM | POA: Diagnosis not present

## 2015-01-24 ENCOUNTER — Encounter (HOSPITAL_BASED_OUTPATIENT_CLINIC_OR_DEPARTMENT_OTHER): Payer: Medicaid Other | Attending: Internal Medicine

## 2015-01-24 DIAGNOSIS — L97522 Non-pressure chronic ulcer of other part of left foot with fat layer exposed: Secondary | ICD-10-CM | POA: Insufficient documentation

## 2015-01-24 DIAGNOSIS — I252 Old myocardial infarction: Secondary | ICD-10-CM | POA: Diagnosis not present

## 2015-01-24 DIAGNOSIS — M069 Rheumatoid arthritis, unspecified: Secondary | ICD-10-CM | POA: Diagnosis not present

## 2015-01-24 DIAGNOSIS — L97512 Non-pressure chronic ulcer of other part of right foot with fat layer exposed: Secondary | ICD-10-CM | POA: Diagnosis not present

## 2015-01-24 DIAGNOSIS — I10 Essential (primary) hypertension: Secondary | ICD-10-CM | POA: Insufficient documentation

## 2015-01-24 DIAGNOSIS — M109 Gout, unspecified: Secondary | ICD-10-CM | POA: Diagnosis not present

## 2015-01-24 DIAGNOSIS — E1140 Type 2 diabetes mellitus with diabetic neuropathy, unspecified: Secondary | ICD-10-CM | POA: Insufficient documentation

## 2015-01-24 DIAGNOSIS — F419 Anxiety disorder, unspecified: Secondary | ICD-10-CM | POA: Diagnosis not present

## 2015-01-24 DIAGNOSIS — J45909 Unspecified asthma, uncomplicated: Secondary | ICD-10-CM | POA: Insufficient documentation

## 2015-01-24 DIAGNOSIS — M1990 Unspecified osteoarthritis, unspecified site: Secondary | ICD-10-CM | POA: Insufficient documentation

## 2015-01-24 DIAGNOSIS — E11621 Type 2 diabetes mellitus with foot ulcer: Secondary | ICD-10-CM | POA: Diagnosis present

## 2015-02-07 DIAGNOSIS — L97522 Non-pressure chronic ulcer of other part of left foot with fat layer exposed: Secondary | ICD-10-CM | POA: Diagnosis not present

## 2015-02-07 DIAGNOSIS — E1140 Type 2 diabetes mellitus with diabetic neuropathy, unspecified: Secondary | ICD-10-CM | POA: Diagnosis not present

## 2015-02-07 DIAGNOSIS — L97512 Non-pressure chronic ulcer of other part of right foot with fat layer exposed: Secondary | ICD-10-CM | POA: Diagnosis not present

## 2015-02-07 DIAGNOSIS — E11621 Type 2 diabetes mellitus with foot ulcer: Secondary | ICD-10-CM | POA: Diagnosis not present

## 2015-02-15 ENCOUNTER — Ambulatory Visit: Payer: Medicaid Other | Admitting: Surgery

## 2015-02-16 NOTE — Progress Notes (Signed)
[name], [name] ([id number]) Visit Report for 02/15/2015 ROS/PFSH Details Patient Name: [name], [name]. Date of Service: 02/15/2015 8:45 AM Medical Record Number: [id number] Patient Account Number: [id number] Date of Birth/Sex: Feb 03, 1966 (49 y.o. Male) Treating RN: Huel Coventry Primary Care Physician: Other Clinician: Referring Physician: Treating Physician/Extender: Rudene Re in Treatment: 0 Information Obtained From Chart Wound History Do you currently have one or more open woundso Yes How many open wounds do you currently haveo 2 Constitutional Symptoms (General Health) Complaints and Symptoms: Negative for: Fatigue; Fever; Chills; Marked Weight Change Eyes Complaints and Symptoms: Positive for: Vision Changes - blurriness 2-3 years; Glasses / Contacts Negative for: Dry Eyes Medical History: Negative for: Cataracts; Glaucoma; Optic Neuritis Ear/Nose/Mouth/Throat Complaints and Symptoms: No Complaints or Symptoms Complaints and Symptoms: Negative for: Difficult clearing ears; Sinusitis Medical History: Positive for: Chronic sinus problems/congestion - seasonal allergies Negative for: Middle ear problems Gastrointestinal Complaints and Symptoms: Positive for: Nausea - diagnosed chronic 2010 ; Vomiting - diagnosed chronic 2010 Negative for: Frequent diarrhea Medical HistoryRAYNOLD, [name] Negative for: Cirrhosis ; Colitis; Crohnos; Hepatitis A; Hepatitis B; Hepatitis C Integumentary (Skin) Complaints and Symptoms: Positive for: Wounds - landR foot Negative for: Bleeding or bruising tendency; Breakdown; Swelling Medical History: Negative for: History of Burn; History of pressure wounds Musculoskeletal Complaints and Symptoms: Positive for: Muscle Pain - lower spinal pain Negative for: Muscle Weakness Medical History: Positive for: Gout - bilateral feet; Rheumatoid Arthritis - 2010; Osteoarthritis - 2010 Negative for:  Osteomyelitis Neurologic Complaints and Symptoms: Positive for: Numbness/parasthesias - mild left upper extremity--occassional Negative for: Focal/Weakness Medical History: Positive for: Neuropathy - lower extremities 2009 Negative for: Dementia; Quadriplegia; Paraplegia; Seizure Disorder Psychiatric Complaints and Symptoms: Negative for: Anxiety; Claustrophobia Medical History: Positive for: Confinement Anxiety - minor Negative for: Anorexia/bulimia Hematologic/Lymphatic Complaints and Symptoms: No Complaints or Symptoms Medical History: Positive for: Anemia - 2 years Negative for: Hemophilia; Human Immunodeficiency Virus; Lymphedema; Sickle Cell Disease Respiratory [name], [name] ([id number]) Complaints and Symptoms: No Complaints or Symptoms Medical History: Positive for: Asthma - life Negative for: Aspiration; Chronic Obstructive Pulmonary Disease (COPD); Pneumothorax; Sleep Apnea; Tuberculosis Cardiovascular Complaints and Symptoms: No Complaints or Symptoms Medical History: Positive for: Hypertension - 10 years; Myocardial Infarction - Minor MI 2012 Negative for: Angina; Arrhythmia; Congestive Heart Failure; Coronary Artery Disease; Deep Vein Thrombosis; Hypotension; Peripheral Arterial Disease; Peripheral Venous Disease; Phlebitis; Vasculitis Endocrine Complaints and Symptoms: No Complaints or Symptoms Medical History: Positive for: Type II Diabetes - 15 years Negative for: Type I Diabetes Time with diabetes: 15 years Treated with: Oral agents Blood sugar tested every day: Yes Tested : once daily Genitourinary Complaints and Symptoms: No Complaints or Symptoms Medical History: Negative for: End Stage Renal Disease Immunological Complaints and Symptoms: No Complaints or Symptoms Medical History: Negative for: Lupus Erythematosus; Raynaudos Oncologic Complaints and Symptoms: No Complaints or Symptoms [name], [name] ([id number]) Medical  History: Negative for: Received Chemotherapy; Received Radiation HBO Extended History Items Ear/Nose/Mouth/Throat: Chronic sinus problems/congestion Electronic Signature(s) Signed: 02/15/2015 3:54:28 PM By: Elliot Gurney RN, BSN, Kim RN, BSN Signed: 02/15/2015 4:17:13 PM By: Evlyn Kanner MD, FACS Entered By: Elliot Gurney RN, BSN, Kim on 02/14/2015 17:25:54

## 2015-02-28 ENCOUNTER — Encounter: Payer: Medicaid Other | Attending: Surgery | Admitting: Surgery

## 2015-02-28 DIAGNOSIS — M109 Gout, unspecified: Secondary | ICD-10-CM | POA: Diagnosis not present

## 2015-02-28 DIAGNOSIS — E11621 Type 2 diabetes mellitus with foot ulcer: Secondary | ICD-10-CM | POA: Diagnosis present

## 2015-02-28 DIAGNOSIS — Z880 Allergy status to penicillin: Secondary | ICD-10-CM | POA: Diagnosis not present

## 2015-02-28 DIAGNOSIS — D649 Anemia, unspecified: Secondary | ICD-10-CM | POA: Diagnosis not present

## 2015-02-28 DIAGNOSIS — J45909 Unspecified asthma, uncomplicated: Secondary | ICD-10-CM | POA: Diagnosis not present

## 2015-02-28 DIAGNOSIS — M1990 Unspecified osteoarthritis, unspecified site: Secondary | ICD-10-CM | POA: Diagnosis not present

## 2015-02-28 DIAGNOSIS — E785 Hyperlipidemia, unspecified: Secondary | ICD-10-CM | POA: Insufficient documentation

## 2015-02-28 DIAGNOSIS — E1142 Type 2 diabetes mellitus with diabetic polyneuropathy: Secondary | ICD-10-CM | POA: Insufficient documentation

## 2015-02-28 DIAGNOSIS — L84 Corns and callosities: Secondary | ICD-10-CM | POA: Diagnosis not present

## 2015-02-28 DIAGNOSIS — L97512 Non-pressure chronic ulcer of other part of right foot with fat layer exposed: Secondary | ICD-10-CM | POA: Insufficient documentation

## 2015-02-28 DIAGNOSIS — I10 Essential (primary) hypertension: Secondary | ICD-10-CM | POA: Insufficient documentation

## 2015-02-28 DIAGNOSIS — L97522 Non-pressure chronic ulcer of other part of left foot with fat layer exposed: Secondary | ICD-10-CM | POA: Diagnosis not present

## 2015-02-28 DIAGNOSIS — Z87891 Personal history of nicotine dependence: Secondary | ICD-10-CM | POA: Insufficient documentation

## 2015-02-28 DIAGNOSIS — M069 Rheumatoid arthritis, unspecified: Secondary | ICD-10-CM | POA: Diagnosis not present

## 2015-02-28 DIAGNOSIS — I252 Old myocardial infarction: Secondary | ICD-10-CM | POA: Diagnosis not present

## 2015-02-28 NOTE — Progress Notes (Signed)
[name] ([id number]) Visit Report for 02/28/2015 Allergy List Details Patient Name: [name], [name]. Date of Service: 02/28/2015 9:45 AM Medical Record Number: [id number] Patient Account Number: [id number] Date of Birth/Sex: [birth year] (49 y.o. Male) Treating RN: Huel Coventry Primary Care Physician: Other Clinician: Referring Physician: Merwyn Katos Treating Physician/Extender: Rudene Re in Treatment: 0 Allergies Active Allergies aspirin Reaction: nose bleed penicillin Reaction: rash Allergy Notes Electronic Signature(s) Signed: 02/28/2015 11:56:10 AM By: Elliot Gurney, RN, BSN, Kim RN, BSN Entered By: Elliot Gurney, RN, BSN, Kim on 02/28/2015 10:33:09 [name] ([id number]) -------------------------------------------------------------------------------- Arrival Information Details Patient Name: [name]. Date of Service: 02/28/2015 9:45 AM Medical Record Number: [id number] Patient Account Number: [id number] Date of Birth/Sex: 10/24/1965 (49 y.o. Male) Treating RN: Huel Coventry Primary Care Physician: Other Clinician: Referring Physician: Merwyn Katos Treating Physician/Extender: Rudene Re in Treatment: 0 Visit Information Patient Arrived: [name] Time: 09:58 Accompanied By: self Transfer Assistance: None Patient Identification Verified: Yes Secondary Verification Process Yes Completed: Patient Has Alerts: Yes Patient Alerts: Type II Diabetic Electronic Signature(s) Signed: 02/28/2015 11:56:10 AM By: Elliot Gurney, RN, BSN, Kim RN, BSN Entered By: Elliot Gurney, RN, BSN, Kim on 02/28/2015 10:03:20 [name] ([id number]) -------------------------------------------------------------------------------- Clinic Level of Care Assessment Details Patient Name: [name]. Date of Service: 02/28/2015 9:45 AM Medical Record Number: [id number] Patient Account Number: [id number] Date of Birth/Sex: [birth year] (49 y.o. Male) Treating RN: Huel Coventry Primary Care Physician: Other Clinician: Referring Physician: Merwyn Katos Treating Physician/Extender: Rudene Re in Treatment: 0 Clinic Level of Care Assessment Items TOOL 1 Quantity Score []  - Use when EandM and Procedure is performed on INITIAL visit 0 ASSESSMENTS - Nursing Assessment / Reassessment X - General Physical Exam (combine w/ comprehensive assessment (listed just 1 20 below) when performed on new pt. evals) X - Comprehensive Assessment (HX, ROS, Risk Assessments, Wounds Hx, etc.) 1 25 ASSESSMENTS - Wound and Skin Assessment / Reassessment []  - Dermatologic / Skin Assessment (not related to wound area) 0 ASSESSMENTS - Ostomy and/or Continence Assessment and Care []  - Incontinence Assessment and Management 0 []  - Ostomy Care Assessment and Management (repouching, etc.) 0 PROCESS - Coordination of Care X - Simple Patient / Family Education for ongoing care 1 15 []  - Complex (extensive) Patient / Family Education for ongoing care 0 X - Staff obtains Chiropractor, Records, Test Results / Process Orders 1 10 []  - Staff telephones HHA, Nursing Homes / Clarify orders / etc 0 []  - Routine Transfer to another Facility (non-emergent condition) 0 []  - Routine Hospital Admission (non-emergent condition) 0 X - New Admissions / Manufacturing engineer / Ordering NPWT, Apligraf, etc. 1 15 []  - Emergency Hospital Admission (emergent condition) 0 PROCESS - Special Needs []  - Pediatric / Minor Patient Management 0 []  - Isolation Patient Management 0 [name], [name] ([id number]) []  - Hearing / Language / Visual special needs 0 []  - Assessment of Community assistance (transportation, D/C planning, etc.) 0 []  - Additional assistance / Altered mentation 0 []  - Support Surface(s) Assessment (bed, cushion, seat, etc.) 0 INTERVENTIONS - Miscellaneous []  - External ear exam 0 []  - Patient Transfer (multiple staff / Nurse, adult / Similar devices) 0 []  - Simple Staple / Suture  removal (25 or less) 0 []  - Complex Staple / Suture removal (26 or more) 0 []  - Hypo/Hyperglycemic Management (do not check if billed separately) 0 X - Ankle / Brachial Index (ABI) - do not check if billed separately 1 15 Has the patient been seen  at the hospital within the last three years: Yes Total Score: 100 Level Of Care: New/Established - Level 3 Electronic Signature(s) Signed: 02/28/2015 11:56:10 AM By: Elliot Gurney, RN, BSN, Kim RN, BSN Entered By: Elliot Gurney, RN, BSN, Kim on 02/28/2015 11:15:17 [name] ([id number]) -------------------------------------------------------------------------------- Encounter Discharge Information Details Patient Name: [name]. Date of Service: 02/28/2015 9:45 AM Medical Record Number: [id number] Patient Account Number: [id number] Date of Birth/Sex: 01-25-1966 (49 y.o. Male) Treating RN: Huel Coventry Primary Care Physician: Other Clinician: Referring Physician: Merwyn Katos Treating Physician/Extender: Rudene Re in Treatment: 0 Encounter Discharge Information Items Discharge Pain Level: 0 Discharge Condition: Stable Ambulatory Status: Ambulatory Discharge Destination: Home Transportation: Private Auto Accompanied By: self Schedule Follow-up Appointment: Yes Medication Reconciliation completed and provided to Patient/Care Yes Daizee Firmin: Provided on Clinical Summary of Care: 02/28/2015 Form Type Recipient Paper Patient [facility] Electronic Signature(s) Signed: 02/28/2015 11:56:10 AM By: Elliot Gurney, RN, BSN, Kim RN, BSN Previous Signature: 02/28/2015 11:12:22 AM Version By: Gwenlyn Perking Entered By: Elliot Gurney RN, BSN, Kim on 02/28/2015 11:17:07 [name] ([id number]) -------------------------------------------------------------------------------- Lower Extremity Assessment Details Patient Name: [name]. Date of Service: 02/28/2015 9:45 AM Medical Record Number: [id number] Patient Account Number: [id number] Date of  Birth/Sex: [birth year] (49 y.o. Male) Treating RN: Huel Coventry Primary Care Physician: Other Clinician: Referring Physician: Merwyn Katos Treating Physician/Extender: Rudene Re in Treatment: 0 Edema Assessment Assessed: Kyra Searles: No] [Right: No] Edema: [Left: No] [Right: No] Vascular Assessment Pulses: Posterior Tibial Palpable: [Left:Yes] [Right:Yes] Doppler: [Left:Multiphasic] [Right:Multiphasic] Dorsalis Pedis Palpable: [Left:Yes] [Right:Yes] Doppler: [Left:Multiphasic] [Right:Multiphasic] Extremity colors, hair growth, and conditions: Extremity Color: [Left:Normal] [Right:Normal] Hair Growth on Extremity: [Left:Yes] [Right:Yes] Temperature of Extremity: [Left:Warm] [Right:Warm] Capillary Refill: [Left:< 3 seconds] [Right:< 3 seconds] Blood Pressure: Brachial: [Left:90] [Right:118] Dorsalis Pedis: 122 [Left:Dorsalis Pedis: 140] Ankle: Posterior Tibial: 132 [Left:Posterior Tibial: 150 1.12] [Right:1.27] Toe Nail Assessment Left: Right: Thick: No No Discolored: No No Deformed: No No Improper Length and Hygiene: No No Electronic Signature(s) Signed: 02/28/2015 11:56:10 AM By: Elliot Gurney, RN, BSN, Kim RN, BSN Entered By: Elliot Gurney, RN, BSN, Kim on 02/28/2015 10:19:44 [name] ([id number]) -------------------------------------------------------------------------------- Multi Wound Chart Details Patient Name: [name]. Date of Service: 02/28/2015 9:45 AM Medical Record Number: [id number] Patient Account Number: [id number] Date of Birth/Sex: 05-16-1965 (49 y.o. 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] [2:the Lower Extremity] [3:Diabetic Wound/Ulcer of the Lower Extremity] 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 Ulcer] [2:Pressure Ulcer] [3:Pressure Ulcer] Comorbid History: [1:Chronic sinus problems/congestion, Anemia, Asthma, Hypertension, Myocardial Hypertension, Myocardial Infarction, Type II Diabetes, Gout, Rheumatoid Arthritis, Osteoarthritis, Neuropathy, Confinement Neuropathy, Confinement Anxiety]  [2:Chronic sinus problems/congestion, Anemia, Asthma, Infarction, Type II Diabetes, Gout, Rheumatoid Arthritis, Osteoarthritis, Anxiety] [3:Chronic sinus problems/congestion, Anemia, Asthma, Hypertension, Myocardial Infarction, Type II Diabetes, Gout,  Rheumatoid Arthritis, Osteoarthritis, Neuropathy, Confinement Anxiety] Date Acquired: [1:02/18/2014] [2:02/18/2014] [3:02/18/2014] Weeks of Treatment: [1:0] [2:0] [3:0] Wound Status: [1:Open] [2:Open] [3:Open] Measurements L x W x D 1.4x0.9x0.7 [2:0.3x0.7x0.1] [3:1x1.1x0.4] (cm) Area (cm) : [1:0.99] [2:0.165] [3:0.864] Volume (cm) : [1:0.693] [2:0.016] [3:0.346] % Reduction in Area: [1:N/A] [2:N/A] [3:0.00%] % Reduction  in Volume: N/A [2:N/A] [3:0.00%] Classification: [1:Grade 2] [2:Grade 1] [3:Grade 2] Exudate Amount: [1:Large] [2:None Present] [3:Small] Exudate Type: [1:Serosanguineous] [2:N/A] [3:Serous] Exudate Color: [1:red, brown] [2:N/A] [3:amber] Wound Margin: Distinct, outline attached Flat and Intact Distinct, outline attached Granulation Amount: None Present (0%) Small (1-33%) N/A Granulation Quality: N/A Red N/A Necrotic Amount: Large (67-100%) None Present (0%) N/A Exposed Structures: Fascia: No Fascia: No Fascia: No Fat: No Fat: No Fat:  No Tendon: No Tendon: No Tendon: No Muscle: No Muscle: No Muscle: No Joint: No Joint: No Joint: No Bone: No Bone: No Bone: No Limited to Skin Limited to Skin Limited to Skin Breakdown Breakdown Breakdown Periwound Skin Texture: Callus: Yes Edema: No Callus: Yes Edema: No Excoriation: No Excoriation: No Induration: No Induration: No Callus: No Crepitus: No Crepitus: No Fluctuance: No Fluctuance: No Friable: No Friable: No Rash: No Rash: No Scarring: No Scarring: No Periwound Skin Moist: Yes Maceration: No Moist: Yes Moisture: Maceration: No Moist: No Dry/Scaly: No Dry/Scaly: No Periwound Skin Color: Atrophie Blanche: No Atrophie Blanche: No No Abnormalities Noted Cyanosis: No Cyanosis: No Ecchymosis: No Ecchymosis: No Erythema: No Erythema: No Hemosiderin Staining: No Hemosiderin Staining: No Mottled: No Mottled: No Pallor: No Pallor: No Rubor: No Rubor: No Tenderness on No No No Palpation: Wound Preparation: Ulcer Cleansing: Ulcer Cleansing: Ulcer Cleansing: Rinsed/Irrigated with Rinsed/Irrigated with Rinsed/Irrigated with Saline Saline Saline Topical Anesthetic Topical Anesthetic Topical Anesthetic Applied: Other: lidocaine Applied: None Applied: Other: lidocaine 4% 4% Treatment Notes Electronic Signature(s) Signed: 02/28/2015 11:56:10 AM By: Elliot Gurney, RN, BSN, Kim RN, BSN Entered By: Elliot Gurney, RN, BSN, Kim on 02/28/2015 10:45:33 [name] ([id number]) -------------------------------------------------------------------------------- Multi-Disciplinary Care Plan Details Patient Name: JDEN, DURST. Date of Service: 02/28/2015 9:45 AM Medical Record Number: [id number] Patient Account Number: [id number] Date of Birth/Sex: 1965-08-17 (49 y.o. Male) Treating RN: Huel Coventry Primary Care Physician: Other Clinician: Referring Physician: Merwyn Katos Treating Physician/Extender: Rudene Re in Treatment: 0 Active Inactive Abuse /  Safety / Falls / Self Care Management Nursing Diagnoses: Potential for falls Goals: Patient will remain injury free Date Initiated: 02/28/2015 Goal Status: Active Interventions: Assess fall risk on admission and as needed Notes: Nutrition Nursing Diagnoses: Impaired glucose control: actual or potential Goals: Patient/caregiver agrees to and verbalizes understanding of need to obtain nutritional consultation Date Initiated: 02/28/2015 Goal Status: Active Interventions: Assess patient nutrition upon admission and as needed per policy Notes: Orientation to the Wound Care Program Nursing Diagnoses: Knowledge deficit related to the wound healing center program Goals: Patient/caregiver will verbalize understanding of the Wound Healing Center Program Date Initiated: 02/28/2015 [name], [name] ([id number]) Goal Status: Active Interventions: Provide education on orientation to the wound center Notes: Pressure Nursing Diagnoses: Potential for impaired tissue integrity related to pressure, friction, moisture, and shear Goals: Patient will remain free of pressure ulcers Date Initiated: 02/28/2015 Goal Status: Active Interventions: Assess: immobility, friction, shearing, incontinence upon admission and as needed Notes: Wound/Skin Impairment Nursing Diagnoses: Impaired tissue integrity Goals: Ulcer/skin breakdown will heal within 14 weeks Date Initiated: 02/28/2015 Goal Status: Active Interventions: Assess patient/caregiver ability to obtain necessary supplies Notes: Electronic Signature(s) Signed: 02/28/2015 11:56:10 AM By: Elliot Gurney, RN, BSN, Kim RN, BSN Entered By: Elliot Gurney, RN, BSN, Kim on 02/28/2015 10:35:59 [name] ([id number]) -------------------------------------------------------------------------------- Pain Assessment Details Patient Name: [name]. Date of Service: 02/28/2015 9:45 AM Medical Record Number: [id number] Patient Account Number:  [id number] Date of Birth/Sex: Oct 26, 1965 (49 y.o. Male) Treating RN: Huel Coventry Primary Care Physician: Other Clinician: Referring Physician: Merwyn Katos Treating  Physician/Extender: Rudene Re in Treatment: 0 Active Problems Location of Pain Severity and Description of Pain Patient Has Paino No Site Locations With Dressing Change: Yes Duration of the Pain. Constant / Intermittento Constant Rate the pain. Current Pain Level: 9 Worst Pain Level: 9 Least Pain Level: 9 Character of Pain Describe the Pain: Sharp, Stabbing Pain Management and Medication Current Pain Management: Medication: No Cold Application: No Rest: No Massage: No Activity: No T.E.N.S.: No Heat Application: No Leg drop or elevation: No Is the Current Pain Management Inadequate Adequate: How does your pain impact your activities of daily livingo Sleep: No Bathing: No Appetite: No Relationship With Others: No Bladder Continence: No Emotions: No Bowel Continence: No Work: No Toileting: No Drive: No Dressing: No Hobbies: No Notes patient states at a 9 all the time [name], [name] ([id number]) Electronic Signature(s) Signed: 02/28/2015 11:56:10 AM By: Elliot Gurney, RN, BSN, Kim RN, BSN Entered By: Elliot Gurney, RN, BSN, Kim on 02/28/2015 10:04:09 [name] ([id number]) -------------------------------------------------------------------------------- Patient/Caregiver Education Details Patient Name: [name]. Date of Service: 02/28/2015 9:45 AM Medical Record Number: [id number] Patient Account Number: [id number] Date of Birth/Gender: 04-18-1966 (49 y.o. Male) Treating RN: Huel Coventry Primary Care Physician: Other Clinician: Referring Physician: Merwyn Katos Treating Physician/Extender: Rudene Re in Treatment: 0 Education Assessment Education Provided To: Patient Education Topics Provided Welcome To The Wound Care Center: Handouts: Welcome To The W[facility] Methods:  Demonstration, Explain/Verbal Responses: State content correctly Wound/Skin Impairment: Handouts: Caring for Your Ulcer, Other: wound care as prescribed Methods: Demonstration, Explain/Verbal Responses: State content correctly Electronic Signature(s) Signed: 02/28/2015 11:56:10 AM By: Elliot Gurney, RN, BSN, Kim RN, BSN Entered By: Elliot Gurney, RN, BSN, Kim on 02/28/2015 11:17:41 KHAYMAN, KIRSCH ([id number]) -------------------------------------------------------------------------------- Wound Assessment Details Patient Name: [name]. Date of Service: 02/28/2015 9:45 AM Medical Record Number: [id number] Patient Account Number: [id number] Date of Birth/Sex: 1966/02/21 (49 y.o. Male) Treating RN: Huel Coventry Primary Care Physician: Other Clinician: Referring Physician: Merwyn Katos Treating Physician/Extender: Rudene Re in Treatment: 0 Wound Status Wound Number: 1 Primary Diabetic Wound/Ulcer of the Lower Etiology: Extremity Wound Location: Right Foot - Plantar Secondary Pressure Ulcer Wounding Event: Pressure Injury Etiology: Date Acquired: 02/18/2014 Wound Open Weeks Of Treatment: 0 Status: Clustered Wound: No Comorbid Chronic sinus problems/congestion, History: Anemia, Asthma, Hypertension, Myocardial Infarction, Type II Diabetes, Gout, Rheumatoid Arthritis, Osteoarthritis, Neuropathy, Confinement Anxiety Photos Wound Measurements Length: (cm) 1.4 Width: (cm) 0.9 Depth: (cm) 0.7 Area: (cm) 0.99 Volume: (cm) 0.693 % Reduction in Area: 0% % Reduction in Volume: 0% Wound Description Classification: Grade 2 Foul Odor Afte Wound Margin: Distinct, outline attached Exudate Amount: Large Exudate Type: Serosanguineous Exudate Color: red, brown r Cleansing: No Wound Bed [name], [name] ([id number]) Granulation Amount: None Present (0%) Exposed Structure Necrotic Amount: Large (67-100%) Fascia Exposed: No Necrotic Quality: Adherent Slough Fat Layer Exposed:  No Tendon Exposed: No Muscle Exposed: No Joint Exposed: No Bone Exposed: No Limited to Skin Breakdown Periwound Skin Texture Texture Color No Abnormalities Noted: No No Abnormalities Noted: No Callus: Yes Atrophie Blanche: No Crepitus: No Cyanosis: No Excoriation: No Ecchymosis: No Fluctuance: No Erythema: No Friable: No Hemosiderin Staining: No Induration: No Mottled: No Localized Edema: No Pallor: No Rash: No Rubor: No Scarring: No Moisture No Abnormalities Noted: No Dry / Scaly: No Maceration: No Moist: Yes Wound Preparation Ulcer Cleansing: Rinsed/Irrigated with Saline Topical Anesthetic Applied: Other: lidocaine 4%, Treatment Notes Wound #1 (Right, Plantar Foot) 1. Cleansed with: Clean wound with Normal Saline 2. Anesthetic Topical  Lidocaine 4% cream to wound bed prior to debridement 4. Dressing Applied: Other dressing (specify in notes) Notes sorbact packed in to wounds; felt surrounding wounds, gauze, abd and comform to secure. Left front off- loader; right peg assist Electronic Signature(s) Signed: 02/28/2015 11:56:10 AM By: Elliot Gurney, RN, BSN, Kim RN, BSN Entered By: Elliot Gurney, RN, BSN, Kim on 02/28/2015 11:52:04 [name], [name] ([id number]) [name], [name] ([id number]) -------------------------------------------------------------------------------- Wound Assessment Details Patient Name: [name], [name]. Date of Service: 02/28/2015 9:45 AM Medical Record Number: [id number] Patient Account Number: [id number] Date of Birth/Sex: 21-Mar-1966 (49 y.o. Male) Treating RN: Huel Coventry Primary Care Physician: Other Clinician: Referring Physician: Merwyn Katos Treating Physician/Extender: Rudene Re in Treatment: 0 Wound Status Wound Number: 2 Primary Diabetic Wound/Ulcer of the Lower Etiology: Extremity Wound Location: Metatarsal head fifth - Plantar, Lateral Secondary Pressure Ulcer Etiology: Wounding Event: Pressure Injury Wound Open Date  Acquired: 02/18/2014 Status: Weeks Of Treatment: 0 Comorbid Chronic sinus problems/congestion, Clustered Wound: No History: Anemia, Asthma, Hypertension, Myocardial Infarction, Type II Diabetes, Gout, Rheumatoid Arthritis, Osteoarthritis, Neuropathy, Confinement Anxiety Photos Photo Uploaded By: Elliot Gurney, RN, BSN, Kim on 02/28/2015 11:50:23 Wound Measurements Length: (cm) 0.3 % Reducti Width: (cm) 0.7 % Reducti Depth: (cm) 0.1 Area: (cm) 0.165 Volume: (cm) 0.016 on in Area: on in Volume: Wound Description Classification: Grade 1 Wound Margin: Flat and Intact Exudate Amount: None Present Wound Bed Granulation Amount: Small (1-33%) Exposed Structure [name], [name] ([id number]) Granulation Quality: Red Fascia Exposed: No Necrotic Amount: None Present (0%) Fat Layer Exposed: No Tendon Exposed: No Muscle Exposed: No Joint Exposed: No Bone Exposed: No Limited to Skin Breakdown Periwound Skin Texture Texture Color No Abnormalities Noted: No No Abnormalities Noted: No Callus: No Atrophie Blanche: No Crepitus: No Cyanosis: No Excoriation: No Ecchymosis: No Fluctuance: No Erythema: No Friable: No Hemosiderin Staining: No Induration: No Mottled: No Localized Edema: No Pallor: No Rash: No Rubor: No Scarring: No Moisture No Abnormalities Noted: No Dry / Scaly: No Maceration: No Moist: No Wound Preparation Ulcer Cleansing: Rinsed/Irrigated with Saline Topical Anesthetic Applied: None Electronic Signature(s) Signed: 02/28/2015 11:56:10 AM By: Elliot Gurney, RN, BSN, Kim RN, BSN Entered By: Elliot Gurney, RN, BSN, Kim on 02/28/2015 10:24:55 [name] ([id number]) -------------------------------------------------------------------------------- Wound Assessment Details Patient Name: [name]. Date of Service: 02/28/2015 9:45 AM Medical Record Number: [id number] Patient Account Number: [id number] Date of Birth/Sex: [birth year] (49 y.o. Male) Treating RN: Huel Coventry Primary Care Physician: Other Clinician: Referring Physician: Merwyn Katos Treating Physician/Extender: Rudene Re in Treatment: 0 Wound Status Wound Number: 3 Primary Diabetic Wound/Ulcer of the Lower Etiology: Extremity Wound Location: Left Metatarsal head first - Plantar Secondary Pressure Ulcer Etiology: Wounding Event: Pressure Injury Wound Open Date Acquired: 02/18/2014 Status: Weeks Of Treatment: 0 Comorbid Chronic sinus problems/congestion, Clustered Wound: No History: Anemia, Asthma, Hypertension, Myocardial Infarction, Type II Diabetes, Gout, Rheumatoid Arthritis, Osteoarthritis, Neuropathy, Confinement Anxiety Photos Photo Uploaded By: Elliot Gurney, RN, BSN, Kim on 02/28/2015 11:50:45 Wound Measurements Length: (cm) 1 Width: (cm) 1.1 Depth: (cm) 0.4 Area: (cm) 0.864 Volume: (cm) 0.346 % Reduction in Area: 0% % Reduction in Volume: 0% Wound Description Classification: Grade 2 Wound Margin: Distinct, outline attached Exudate Amount: Small Exudate Type: Serous Exudate Color: amber [name], [name] ([id number]) Wound Bed Exposed Structure Fascia Exposed: No Fat Layer Exposed: No Tendon Exposed: No Muscle Exposed: No Joint Exposed: No Bone Exposed: No Limited to Skin Breakdown Periwound Skin Texture Texture Color No Abnormalities Noted: No No Abnormalities Noted: No Callus: Yes Moisture No Abnormalities Noted: No  Moist: Yes Wound Preparation Ulcer Cleansing: Rinsed/Irrigated with Saline Topical Anesthetic Applied: Other: lidocaine 4%, Treatment Notes Wound #3 (Left, Plantar Metatarsal head first) 1. Cleansed with: Clean wound with Normal Saline 2. Anesthetic Topical Lidocaine 4% cream to wound bed prior to debridement 4. Dressing Applied: Other dressing (specify in notes) Notes sorbact packed in to wounds; felt surrounding wounds, gauze, abd and comform to secure. Left front off- loader; right peg assist Electronic  Signature(s) Signed: 02/28/2015 11:56:10 AM By: Elliot Gurney, RN, BSN, Kim RN, BSN Entered By: Elliot Gurney, RN, BSN, Kim on 02/28/2015 10:26:30 [name], [name] ([id number]) -------------------------------------------------------------------------------- Vitals Details Patient Name: [name]. Date of Service: 02/28/2015 9:45 AM Medical Record Number: [id number] Patient Account Number: [id number] Date of Birth/Sex: December 28, 1965 (49 y.o. Male) Treating RN: Huel Coventry Primary Care Physician: Other Clinician: Referring Physician: Merwyn Katos Treating Physician/Extender: Rudene Re in Treatment: 0 Vital Signs Time Taken: 10:04 Pulse (bpm): 57 Height (in): 72 Respiratory Rate (breaths/min): 18 Source: Stated Blood Pressure (mmHg): 116/65 Weight (lbs): 228 Reference Range: 80 - 120 mg / dl Source: Stated Body Mass Index (BMI): 30.9 Electronic Signature(s) Signed: 02/28/2015 11:56:10 AM By: Elliot Gurney, RN, BSN, Kim RN, BSN Entered By: Elliot Gurney, RN, BSN, Kim on 02/28/2015 10:04:47

## 2015-02-28 NOTE — Progress Notes (Signed)
[name], [name] ([id number]) Visit Report for 02/28/2015 Abuse/Suicide Risk Screen Details Patient Name: [name], [name]. Date of Service: 02/28/2015 9:45 AM Medical Record Number: [id number] Patient Account Number: [id number] Date of Birth/Sex: [birth year] (49 y.o. Male) Treating RN: Huel Coventry Primary Care Physician: Other Clinician: Referring Physician: Merwyn Katos Treating Physician/Extender: Rudene Re in Treatment: 0 Abuse/Suicide Risk Screen Items Answer ABUSE/SUICIDE RISK SCREEN: Has anyone close to you tried to hurt or harm you recentlyo No Do you feel uncomfortable with anyone in your familyo No Has anyone forced you do things that you didnot want to doo No Do you have any thoughts of harming yourselfo No Patient displays signs or symptoms of abuse and/or neglect. No Electronic Signature(s) Signed: 02/28/2015 11:56:10 AM By: Elliot Gurney, RN, BSN, Kim RN, BSN Entered By: Elliot Gurney, RN, BSN, Kim on 02/28/2015 10:29:05 [name] ([id number]) -------------------------------------------------------------------------------- Activities of Daily Living Details Patient Name: [name], [name]. Date of Service: 02/28/2015 9:45 AM Medical Record Number: [id number] Patient Account Number: [id number] Date of Birth/Sex: 08/08/1965 (49 y.o. Male) Treating RN: Huel Coventry Primary Care Physician: Other Clinician: Referring Physician: Merwyn Katos Treating Physician/Extender: Rudene Re in Treatment: 0 Activities of Daily Living Items Answer Activities of Daily Living (Please select one for each item) Drive Automobile Need Assistance Take Medications Completely Able Use Telephone Completely Able Care for Appearance Completely Able Use Toilet Completely Able Bath / Shower Completely Able Dress Self Completely Able Feed Self Completely Able Walk Completely Able Get In / Out Bed Completely Able Housework Completely Able Prepare Meals Completely Able Handle Money  Completely Able Shop for Self Completely Able Electronic Signature(s) Signed: 02/28/2015 11:56:10 AM By: Elliot Gurney, RN, BSN, Kim RN, BSN Entered By: Elliot Gurney, RN, BSN, Kim on 02/28/2015 10:29:26 [name] ([id number]) -------------------------------------------------------------------------------- Education Assessment Details Patient Name: [name]. Date of Service: 02/28/2015 9:45 AM Medical Record Number: [id number] Patient Account Number: [id number] Date of Birth/Sex: 1965-12-01 (49 y.o. Male) Treating RN: Huel Coventry Primary Care Physician: Other Clinician: Referring Physician: Merwyn Katos Treating Physician/Extender: Rudene Re in Treatment: 0 Primary Learner Assessed: Patient Learning Preferences/Education Level/Primary Language Learning Preference: Explanation Highest Education Level: High School Preferred Language: English Cognitive Barrier Assessment/Beliefs Language Barrier: No Translator Needed: No Memory Deficit: No Emotional Barrier: No Cultural/Religious Beliefs Affecting Medical No Care: Physical Barrier Assessment Impaired Vision: No Impaired Hearing: No Decreased Hand dexterity: No Knowledge/Comprehension Assessment Knowledge Level: Medium Comprehension Level: Medium Ability to understand written Medium instructions: Ability to understand verbal Medium instructions: Motivation Assessment Anxiety Level: Calm Cooperation: Cooperative Education Importance: Acknowledges Need Interest in Health Problems: Asks Questions Perception: Coherent Willingness to Engage in Self- High Management Activities: Readiness to Engage in Self- High Management Activities: Electronic Signature(s) [name], [name] ([id number]) Signed: 02/28/2015 11:56:10 AM By: Elliot Gurney, RN, BSN, Kim RN, BSN Entered By: Elliot Gurney, RN, BSN, Kim on 02/28/2015 10:29:58 [name], [name]  ([id number]) -------------------------------------------------------------------------------- Fall Risk Assessment Details Patient Name: [name]. Date of Service: 02/28/2015 9:45 AM Medical Record Number: [id number] Patient Account Number: [id number] Date of Birth/Sex: 19-Nov-1965 (49 y.o. Male) Treating RN: Huel Coventry Primary Care Physician: Other Clinician: Referring Physician: Merwyn Katos Treating Physician/Extender: Rudene Re in Treatment: 0 Fall Risk Assessment Items FALL RISK ASSESSMENT: History of falling - immediate or within 3 months 0 No Secondary diagnosis 0 No Ambulatory aid None/bed rest/wheelchair/nurse 0 No Crutches/cane/walker 15 Yes Furniture 0 No IV Access/Saline Lock 0 No Gait/Training Normal/bed rest/immobile 0 Yes Weak 0 No Impaired 0 No Mental Status Oriented to  own ability 0 Yes Electronic Signature(s) Signed: 02/28/2015 11:56:10 AM By: Elliot Gurney, RN, BSN, Kim RN, BSN Entered By: Elliot Gurney, RN, BSN, Kim on 02/28/2015 10:30:11 [name], [name] ([id number]) -------------------------------------------------------------------------------- Foot Assessment Details Patient Name: [name], [name]. Date of Service: 02/28/2015 9:45 AM Medical Record Number: [id number] Patient Account Number: [id number] Date of Birth/Sex: 14-Mar-1966 (49 y.o. Male) Treating RN: Huel Coventry Primary Care Physician: Other Clinician: Referring Physician: Merwyn Katos Treating Physician/Extender: Rudene Re in Treatment: 0 Foot Assessment Items Site Locations + = Sensation present, - = Sensation absent, C = Callus, U = Ulcer R = Redness, W = Warmth, M = Maceration, PU = Pre-ulcerative lesion F = Fissure, S = Swelling, D = Dryness Assessment Right: Left: Other Deformity: No No Prior Foot Ulcer: No No Prior Amputation: No No Charcot Joint: No No Ambulatory Status: Ambulatory With Help Assistance Device: Cane Gait: Steady Electronic Signature(s) Signed:  02/28/2015 11:56:10 AM By: Elliot Gurney, RN, BSN, Kim RN, BSN Entered By: Elliot Gurney, RN, BSN, Kim on 02/28/2015 10:31:06 [name] ([id number]) -------------------------------------------------------------------------------- Nutrition Risk Assessment Details Patient Name: [name]. Date of Service: 02/28/2015 9:45 AM Medical Record Number: [id number] Patient Account Number: [id number] Date of Birth/Sex: [birth year] (49 y.o. Male) Treating RN: Huel Coventry Primary Care Physician: Other Clinician: Referring Physician: Merwyn Katos Treating Physician/Extender: Rudene Re in Treatment: 0 Height (in): 72 Weight (lbs): 228 Body Mass Index (BMI): 30.9 Nutrition Risk Assessment Items NUTRITION RISK SCREEN: I have an illness or condition that made me change the kind and/or 0 No amount of food I eat I eat fewer than two meals per day 0 No I eat few fruits and vegetables, or milk products 0 No I have three or more drinks of beer, liquor or wine almost every day 0 No I have tooth or mouth problems that make it hard for me to eat 0 No I don't always have enough money to buy the food I need 0 No I eat alone most of the time 0 No I take three or more different prescribed or over-the-counter drugs a 0 No day Without wanting to, I have lost or gained 10 pounds in the last six 0 No months I am not always physically able to shop, cook and/or feed myself 0 No Nutrition Protocols Good Risk Protocol 0 No interventions needed Moderate Risk Protocol Electronic Signature(s) Signed: 02/28/2015 11:56:10 AM By: Elliot Gurney, RN, BSN, Kim RN, BSN Entered By: Elliot Gurney, RN, BSN, Kim on 02/28/2015 10:30:26

## 2015-03-01 NOTE — Progress Notes (Addendum)
ARMONDO, DEBEVEC ([id number]) Visit Report for 02/28/2015 Chief Complaint Document Details Patient Name: [name], [name]. Date of Service: 02/28/2015 9:45 AM Medical Record Number: [id number] Patient Account Number: [id number] Date of Birth/Sex: 08-06-1965 (49 y.o. Male) Treating RN: Huel Coventry Primary Care Physician: Other Clinician: Referring Physician: Merwyn Katos Treating Physician/Extender: Rudene Re in Treatment: 0 Information Obtained from: Patient Chief Complaint Patients presents for treatment of an open diabetic ulcer. He presents with ulcers on both feet which she's had for over a year and a half. He has been seen at the wound care center at [facility] and was a patient of Dr. Leanord Hawking for about 2 months Electronic Signature(s) Signed: 02/28/2015 11:19:59 AM By: Evlyn Kanner MD, FACS Entered By: Evlyn Kanner on 02/28/2015 11:19:59 [name] ([id number]) -------------------------------------------------------------------------------- Debridement Details Patient Name: [name]. Date of Service: 02/28/2015 9:45 AM Medical Record Number: [id number] Patient Account Number: [id number] Date of Birth/Sex: 1965/11/27 (49 y.o. Male) Treating RN: Huel Coventry Primary Care Physician: Other Clinician: Referring Physician: Merwyn Katos Treating Physician/Extender: Rudene Re in Treatment: 0 Debridement Performed for Wound #1 Right,Plantar Foot Assessment: Performed By: Physician Evlyn Kanner, MD Debridement: Debridement Pre-procedure Yes Verification/Time Out Taken: Start Time: 10:47 Pain Control: Other : lidocaine 4% Level: Skin/Subcutaneous Tissue Total Area Debrided (L x 1.4 (cm) x 0.9 (cm) = 1.26 (cm) W): Tissue and other Viable, Non-Viable, Callus, Exudate, Fibrin/Slough, Skin, Subcutaneous material debrided: Instrument: Forceps, Scissors Bleeding: Minimum Hemostasis Achieved: Pressure End Time: 10:55 Procedural Pain: 0 Post  Procedural Pain: 0 Response to Treatment: Procedure was tolerated well Post Debridement Measurements of Total Wound Length: (cm) 1.5 Width: (cm) 1.2 Depth: (cm) 1 Volume: (cm) 1.414 Post Procedure Diagnosis Same as Pre-procedure Electronic Signature(s) Signed: 02/28/2015 11:19:03 AM By: Evlyn Kanner MD, FACS Signed: 02/28/2015 11:56:10 AM By: Elliot Gurney, RN, BSN, Kim RN, BSN Entered By: Evlyn Kanner on 02/28/2015 11:19:03 [name], [name] ([id number]) -------------------------------------------------------------------------------- Debridement Details Patient Name: [name]. Date of Service: 02/28/2015 9:45 AM Medical Record Number: [id number] Patient Account Number: [id number] Date of Birth/Sex: 1965-10-16 (49 y.o. Male) Treating RN: Huel Coventry Primary Care Physician: Other Clinician: Referring Physician: Merwyn Katos Treating Physician/Extender: Rudene Re in Treatment: 0 Debridement Performed for Wound #3 Left,Plantar Metatarsal head first Assessment: Performed By: Physician Evlyn Kanner, MD Debridement: Debridement Pre-procedure Yes Verification/Time Out Taken: Start Time: 10:47 Pain Control: Other : lidocaine 4% Level: Skin/Subcutaneous Tissue Total Area Debrided (L x 1 (cm) x 1.1 (cm) = 1.1 (cm) W): Tissue and other Viable, Non-Viable, Callus, Exudate, Fibrin/Slough, Skin, Subcutaneous material debrided: Instrument: Forceps, Scissors Bleeding: Minimum Hemostasis Achieved: Pressure End Time: 10:58 Procedural Pain: 0 Post Procedural Pain: 0 Response to Treatment: Procedure was tolerated well Post Debridement Measurements of Total Wound Length: (cm) 1.7 Width: (cm) 1.2 Depth: (cm) 0.6 Volume: (cm) 0.961 Post Procedure Diagnosis Same as Pre-procedure Electronic Signature(s) Signed: 02/28/2015 11:19:15 AM By: Evlyn Kanner MD, FACS Signed: 02/28/2015 11:56:10 AM By: Elliot Gurney, RN, BSN, Kim RN, BSN Entered By: Evlyn Kanner on 02/28/2015  11:19:15 HENSLEY, [name] ([id number]) -------------------------------------------------------------------------------- HPI Details Patient Name: [name]. Date of Service: 02/28/2015 9:45 AM Medical Record Number: [id number] Patient Account Number: [id number] Date of Birth/Sex: 1965/05/10 (49 y.o. Male) Treating RN: Huel Coventry Primary Care Physician: Other Clinician: Referring Physician: Merwyn Katos Treating Physician/Extender: Rudene Re in Treatment: 0 History of Present Illness Location: ulcers on the plantar aspect of both feet for over a year and a half Quality: Patient reports No Pain. Severity: Patient states  wound are getting worse. Duration: Patient has had the wound for > 18 months prior to seeking treatment at the wound center Context: The wound appeared gradually over time Modifying Factors: Other treatment(s) tried include: his podiatrist has been treating him with Regranex and he has been seen at the [facility] wound care center and treated with surgical debridement, antibiotics and total contact cast. Associated Signs and Symptoms: Patient reports having increase discharge. HPI Description: This is a 49 year old man who comes in for review of wounds on his bilateral plantar feet which are been present for one and a half year. he has been seen at the [facility] wound care center by Dr. Merilynn Finland and was treated there for a couple of months and I'm not entirely sure why he changed to the center. He says it was difficult for him to get an appointment due to shortage of staph. The last time around he had a total contact cast which he cut himself because he was unable to go back to [place]. He has severe diabetic neuropathy with lower extremity pain. He has had a wound over his left plantar first metatarsal head and a wound on the right foot at roughly the second and third plantar metatarsal heads. He has a Darco sandal on the left and a healing sandal on  the right. For roughly the last 8 months. He has been using Regranex and this was been followed by his podiatrist at [place] [facility] He has had multiple x-rays of his feet according to the patient that have not shown infection. He has not had an MRI. He was treated with doxycycline and Septra in the past for a staph aureus infection. He has never been a smoker. Electronic Signature(s) Signed: 02/28/2015 11:25:12 AM By: Evlyn Kanner MD, FACS Entered By: Evlyn Kanner on 02/28/2015 11:25:12 [name] ([id number]) -------------------------------------------------------------------------------- Physical Exam Details Patient Name: [name]. Date of Service: 02/28/2015 9:45 AM Medical Record Number: [id number] Patient Account Number: [id number] Date of Birth/Sex: May 16, 1965 (49 y.o. Male) Treating RN: Huel Coventry Primary Care Physician: Other Clinician: Referring Physician: Merwyn Katos Treating Physician/Extender: Rudene Re in Treatment: 0 Constitutional . Pulse regular. Respirations normal and unlabored. Afebrile. . Eyes Nonicteric. Reactive to light. Ears, Nose, Mouth, and Throat Lips, teeth, and gums WNL.Marland Kitchen Moist mucosa without lesions . Neck supple and nontender. No palpable supraclavicular or cervical adenopathy. Normal sized without goiter. Respiratory WNL. No retractions.. Cardiovascular Pedal Pulses WNL. ABI on the right was 1.27 and the left was 1.12. No clubbing, cyanosis or edema. Lymphatic No adneopathy. No adenopathy. No adenopathy. Musculoskeletal Adexa without tenderness or enlargement.. Digits and nails w/o clubbing, cyanosis, infection, petechiae, ischemia, or inflammatory conditions.. Integumentary (Hair, Skin) No suspicious lesions. No crepitus or fluctuance. No peri-wound warmth or erythema. No masses.Marland Kitchen Psychiatric Judgement and insight Intact.. No evidence of depression, anxiety, or agitation.. Notes on the  right leg he has a very superficial abrasion on the lateral aspect of the fifth metatarsal and this is very superficial. On the plantar aspect of the right foot he has a large amount of callus and a deep ulcer down to the subcutaneous tissue. This will require sharp debridement. On the left foot he has similarly a rather large ulcerated area down to the subcutis tissues surrounded by significant callus and this will need sharp debridement too. Electronic Signature(s) Signed: 02/28/2015 11:26:39 AM By: Evlyn Kanner MD, FACS Entered By: Evlyn Kanner on 02/28/2015 11:26:38 [name] ([id number]) -------------------------------------------------------------------------------- Physician Orders Details  Patient Name: [name], NOE. Date of Service: 02/28/2015 9:45 AM Medical Record Number: [id number] Patient Account Number: [id number] Date of Birth/Sex: 23-Oct-1965 (49 y.o. Male) Treating RN: Huel Coventry Primary Care Physician: Other Clinician: Referring Physician: Merwyn Katos Treating Physician/Extender: Rudene Re in Treatment: 0 Verbal / Phone Orders: Yes Clinician: Huel Coventry Read Back and Verified: Yes Diagnosis Coding Wound Cleansing Wound #1 Right,Plantar Foot o Clean wound with Normal Saline. Wound #2 Right,Lateral,Plantar Metatarsal head fifth o Clean wound with Normal Saline. Wound #3 Left,Plantar Metatarsal head first o Clean wound with Normal Saline. Anesthetic Wound #1 Right,Plantar Foot o Topical Lidocaine 4% cream applied to wound bed prior to debridement Wound #2 Right,Lateral,Plantar Metatarsal head fifth o Topical Lidocaine 4% cream applied to wound bed prior to debridement Wound #3 Left,Plantar Metatarsal head first o Topical Lidocaine 4% cream applied to wound bed prior to debridement Primary Wound Dressing Wound #1 Right,Plantar Foot o Other: - sorbact Wound #2 Right,Lateral,Plantar Metatarsal head fifth o Other: -  sorbact Wound #3 Left,Plantar Metatarsal head first o Other: - sorbact Secondary Dressing Wound #1 Right,Plantar Foot o Conform/Kerlix - felt surrounding wounds Wound #2 Right,Lateral,Plantar Metatarsal head fifth o Conform/Kerlix - felt surrounding wounds [name], William D. ([id number]) Wound #3 Left,Plantar Metatarsal head first o Conform/Kerlix - felt surrounding wounds Dressing Change Frequency Wound #1 Right,Plantar Foot o Change dressing every other day. Wound #2 Right,Lateral,Plantar Metatarsal head fifth o Change dressing every other day. Wound #3 Left,Plantar Metatarsal head first o Change dressing every other day. Follow-up Appointments Wound #1 Right,Plantar Foot o Return Appointment in 1 week. Wound #2 Right,Lateral,Plantar Metatarsal head fifth o Return Appointment in 1 week. Wound #3 Left,Plantar Metatarsal head first o Return Appointment in 1 week. Off-Loading o Open toe surgical shoe to: - front off-loader to left o Open toe surgical shoe with peg assist. - right Additional Orders / Instructions Wound #1 Right,Plantar Foot o Increase protein intake. Wound #2 Right,Lateral,Plantar Metatarsal head fifth o Increase protein intake. Wound #3 Left,Plantar Metatarsal head first o Increase protein intake. Notes TCC next week Electronic Signature(s) Signed: 02/28/2015 11:56:10 AM By: Elliot Gurney RN, BSN, Kim RN, BSN Signed: 02/28/2015 4:04:17 PM By: Evlyn Kanner MD, FACS Entered By: Elliot Gurney RN, BSN, Kim on 02/28/2015 11:14:51 [name], [name] ([id number]) -------------------------------------------------------------------------------- Problem List Details Patient Name: [name], [name]. Date of Service: 02/28/2015 9:45 AM Medical Record Number: [id number] Patient Account Number: [id number] Date of Birth/Sex: 1965/08/31 (49 y.o. 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 Date of Service: 02/28/2015 9:45 AM Medical Record Number: [id number] Patient Account Number: [id number] Date of Birth/Sex: April 11, 1966 (49 y.o. Male) Treating RN: Huel Coventry Primary Care Physician: Other Clinician: Referring Physician: Merwyn Katos Treating Physician/Extender: Rudene Re in Treatment: 0 Subjective Chief Complaint Information obtained from Patient Patients presents for treatment of an open diabetic  ulcer. He presents with ulcers on both feet which she's had for over a year and a half. He has been seen at the wound care center at [facility] and was a patient of Dr. Leanord Hawking for about 2 months History of Present Illness (HPI) The following HPI elements were documented for the patient's wound: Location: ulcers on the plantar aspect of both feet for over a year and a half Quality: Patient reports No Pain. Severity: Patient states wound are getting worse. Duration: Patient has had the wound for > 18 months prior to seeking treatment at the wound center Context: The wound appeared gradually over time Modifying  Factors: Other treatment(s) tried include: his podiatrist has been treating him with Regranex and he has been seen at the [facility] wound care center and treated with surgical debridement, antibiotics and total contact cast. Associated Signs and Symptoms: Patient reports having increase discharge. This is a 49 year old man who comes in for review of wounds on his bilateral plantar feet which are been present for one and a half year. he has been seen at the [facility] wound care center by Dr. Merilynn Finland and was treated there for a couple of months and I'm not entirely sure why he changed to the center. He says it was difficult for him to get an appointment due to shortage of staph. The last time around he had a total contact cast which he cut himself because he was unable to go back to [place]. He has severe diabetic neuropathy with lower extremity pain. He has had a wound over his left plantar first metatarsal head and a wound on the right foot at roughly the second and third plantar metatarsal heads. He has a Darco sandal on the left and a healing sandal on the right. For roughly the last 8 months. He has been using Regranex and this was been followed by his podiatrist at [place] [facility] He has had multiple x-rays of his feet according to the patient that have not shown infection. He has not had an MRI. He was treated with doxycycline and Septra in the past for a staph aureus infection. He has never been a smoker. Wound History Patient presents with 3 open wounds that have been present for approximately 1 year. Patient has been treating wounds in the following manner: regranex. Laboratory tests have not been performed in the last DAYSHON, ROBACK. ([id number]) month. Patient reportedly has not tested positive for an antibiotic resistant organism. Patient reportedly has not tested positive for osteomyelitis. Patient reportedly has not had testing performed to evaluate  circulation in the legs. Patient History Information obtained from Chart. Allergies aspirin (Reaction: nose bleed), penicillin (Reaction: rash) Family History Cancer - Mother, Maternal Grandparents, Diabetes - Father, Heart Disease - Father, Hypertension - Father, No family history of Kidney Disease, Lung Disease, Stroke, Thyroid Problems. Social History Former smoker, Marital Status - Divorced, Alcohol Use - Never, Drug Use - No History, Caffeine Use - Never. Medical And Surgical History Notes Constitutional Symptoms (General Health) Neuropathy; gout, htn; hld; diabetic; back pain Review of Systems (ROS) Constitutional Symptoms (General Health) Denies complaints or symptoms of Fatigue, Fever, Chills, Marked Weight Change. Eyes Complains or has symptoms of Vision Changes - blurriness, Glasses / Contacts. Denies complaints or symptoms of Dry Eyes. Ear/Nose/Mouth/Throat The patient has no complaints or symptoms. Hematologic/Lymphatic The patient has no complaints or symptoms. Respiratory The patient has no complaints or symptoms. Cardiovascular The patient has no complaints or symptoms. Gastrointestinal Complains  or has symptoms of Vomiting. Denies complaints or symptoms of Frequent diarrhea, Nausea. Endocrine The patient has no complaints or symptoms. Genitourinary The patient has no complaints or symptoms. Immunological The patient has no complaints or symptoms. Integumentary (Skin) Complains or has symptoms of Wounds. Neurologic Complains or has symptoms of Numbness/parasthesias - mild left upper extremity. Oncologic [name], [name] ([id number]) The patient has no complaints or symptoms. Psychiatric Complains or has symptoms of Claustrophobia - mild. Denies complaints or symptoms of Anxiety. Medications metformin oral 1 1 tablet oral glipizide 10 mg tablet oral 1 1 tablet oral pravastatin 20 mg tablet oral 1 1 tablet oral lisinopril 10 mg tablet oral 1 1 tablet  oral lisinopril 20 mg tablet oral 1 1 tablet oral Regranex 0.01 % topical gel topical gel topical omeprazole 20 mg tablet,delayed release oral 1 1 tablet,delayed release (DR/EC) oral baclofen 10 mg tablet oral 1 1 tablet oral Vitamin B-12 1,000 mcg/mL injection solution injection 1 1 solution injection Vitamin D2 50,000 unit capsule oral 1 1 capsule oral Objective Constitutional Pulse regular. Respirations normal and unlabored. Afebrile. Vitals Time Taken: 10:04 AM, Height: 72 in, Source: Stated, Weight: 228 lbs, Source: Stated, BMI: 30.9, Pulse: 57 bpm, Respiratory Rate: 18 breaths/min, Blood Pressure: 116/65 mmHg. Eyes Nonicteric. Reactive to light. Ears, Nose, Mouth, and Throat Lips, teeth, and gums WNL.Marland Kitchen Moist mucosa without lesions . Neck supple and nontender. No palpable supraclavicular or cervical adenopathy. Normal sized without goiter. Respiratory WNL. No retractions.. Cardiovascular Pedal Pulses WNL. ABI on the right was 1.27 and the left was 1.12. No clubbing, cyanosis or edema. Lymphatic [name], [name] ([id number]) No adneopathy. No adenopathy. No adenopathy. Musculoskeletal Adexa without tenderness or enlargement.. Digits and nails w/o clubbing, cyanosis, infection, petechiae, ischemia, or inflammatory conditions.Marland Kitchen Psychiatric Judgement and insight Intact.. No evidence of depression, anxiety, or agitation.. General Notes: on the right leg he has a very superficial abrasion on the lateral aspect of the fifth metatarsal and this is very superficial. On the plantar aspect of the right foot he has a large amount of callus and a deep ulcer down to the subcutaneous tissue. This will require sharp debridement. On the left foot he has similarly a rather large ulcerated area down to the subcutis tissues surrounded by significant callus and this will need sharp debridement too. Integumentary (Hair, Skin) No suspicious lesions. No crepitus or fluctuance. No peri-wound warmth  or erythema. No masses.. Wound #1 status is Open. Original cause of wound was Pressure Injury. The wound is located on the Right,Plantar Foot. The wound measures 1.4cm length x 0.9cm width x 0.7cm depth; 0.99cm^2 area and 0.693cm^3 volume. The wound is limited to skin breakdown. There is a large amount of serosanguineous drainage noted. The wound margin is distinct with the outline attached to the wound base. There is no granulation within the wound bed. There is a large (67-100%) amount of necrotic tissue within the wound bed including Adherent Slough. The periwound skin appearance exhibited: Callus, Moist. The periwound skin appearance did not exhibit: Crepitus, Excoriation, Fluctuance, Friable, Induration, Localized Edema, Rash, Scarring, Dry/Scaly, Maceration, Atrophie Blanche, Cyanosis, Ecchymosis, Hemosiderin Staining, Mottled, Pallor, Rubor, Erythema. Wound #2 status is Open. Original cause of wound was Pressure Injury. The wound is located on the Right,Lateral,Plantar Metatarsal head fifth. The wound measures 0.3cm length x 0.7cm width x 0.1cm depth; 0.165cm^2 area and 0.016cm^3 volume. The wound is limited to skin breakdown. There is a none present amount of drainage noted. The wound margin is flat and intact. There  is small (1-33%) red granulation within the wound bed. There is no necrotic tissue within the wound bed. The periwound skin appearance did not exhibit: Callus, Crepitus, Excoriation, Fluctuance, Friable, Induration, Localized Edema, Rash, Scarring, Dry/Scaly, Maceration, Moist, Atrophie Blanche, Cyanosis, Ecchymosis, Hemosiderin Staining, Mottled, Pallor, Rubor, Erythema. Wound #3 status is Open. Original cause of wound was Pressure Injury. The wound is located on the Left,Plantar Metatarsal head first. The wound measures 1cm length x 1.1cm width x 0.4cm depth; 0.864cm^2 area and 0.346cm^3 volume. The wound is limited to skin breakdown. There is a small amount of serous  drainage noted. The wound margin is distinct with the outline attached to the wound base. The periwound skin appearance exhibited: Callus, Moist. 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 Leanord Hawking but then the patient was lost to follow-up for about a month. I have recommended that we stop the Regranex which he has been using for over 8 months now. After sharply debriding the wounds I have recommended that the use Sorbact to be packed into both these wounds and apply some felt off loading. He has been using a Pegasys shoe on the right and a Darco front off loading shoe on the left. He would benefit from a total contact cast but because he has a problem bilaterally we will try and work 1 foot at a time. I have recommended that he should follow-up on a weekly basis patient if he has a total contact cast and never to cut it off himself as he has done the last time around. He says he will be compliant. 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 Procedures Wound #1 Wound #1 is a Diabetic Wound/Ulcer of the Lower Extremity located on the Right,Plantar Foot . There was a Skin/Subcutaneous Tissue Debridement ([id number]) debridement with total area of 1.26 sq cm performed by Evlyn Kanner, MD. with the following instrument(s): Forceps and Scissors to remove Viable and Non-Viable tissue/material  including Exudate, Fibrin/Slough, Skin, Callus, and Subcutaneous after achieving pain control using Other (lidocaine 4%). A time out was conducted prior to the start of the procedure. A Minimum amount of bleeding was controlled with Pressure. The procedure was tolerated well with a pain level of 0 throughout and a pain level of 0 following the procedure. Post Debridement Measurements: 1.5cm length x 1.2cm width x 1cm depth; 1.414cm^3 volume. Post procedure Diagnosis Wound #1: Same as Pre-Procedure Wound #3 Wound #3 is a Diabetic Wound/Ulcer of the Lower Extremity located on the Left,Plantar Metatarsal head first . There was a Skin/Subcutaneous Tissue Debridement ([id number]) debridement with total area of [name], William D. ([id number]) 1.1 sq cm performed by Evlyn Kanner, MD. with the following instrument(s): Forceps and Scissors to remove Viable and Non-Viable tissue/material including Exudate, Fibrin/Slough, Skin, Callus, and Subcutaneous after achieving pain control using Other (lidocaine 4%). A time out was conducted prior to the start of the procedure. A Minimum amount of bleeding was  controlled with Pressure. The procedure was tolerated well with a pain level of 0 throughout and a pain level of 0 following the procedure. Post Debridement Measurements: 1.7cm length x 1.2cm width x 0.6cm depth; 0.961cm^3 volume. Post procedure Diagnosis Wound #3: Same as Pre-Procedure Plan Wound Cleansing: Wound #1 Right,Plantar Foot: Clean wound with Normal Saline. Wound #2 Right,Lateral,Plantar Metatarsal head fifth: Clean wound with Normal Saline. Wound #3 Left,Plantar Metatarsal head first: Clean wound with Normal Saline. Anesthetic: Wound #1 Right,Plantar Foot: Topical Lidocaine 4% cream applied to wound bed prior to debridement Wound #2 Right,Lateral,Plantar Metatarsal head fifth: Topical Lidocaine 4% cream applied to wound bed prior to debridement Wound #3 Left,Plantar Metatarsal head  first: Topical Lidocaine 4% cream applied to wound bed prior to debridement Primary Wound Dressing: Wound #1 Right,Plantar Foot: Other: - sorbact Wound #2 Right,Lateral,Plantar Metatarsal head fifth: Other: - sorbact Wound #3 Left,Plantar Metatarsal head first: Other: - sorbact Secondary Dressing: Wound #1 Right,Plantar Foot: Conform/Kerlix - felt surrounding wounds Wound #2 Right,Lateral,Plantar Metatarsal head fifth: Conform/Kerlix - felt surrounding wounds Wound #3 Left,Plantar Metatarsal head first: Conform/Kerlix - felt surrounding wounds Dressing Change Frequency: Wound #1 Right,Plantar Foot: Change dressing every other day. Wound #2 Right,Lateral,Plantar Metatarsal head fifth: Change dressing every other day. Wound #3 Left,Plantar Metatarsal head first: Change dressing every other day. [name], SLABACH ([id number]) Follow-up Appointments: Wound #1 Right,Plantar Foot: Return Appointment in 1 week. Wound #2 Right,Lateral,Plantar Metatarsal head fifth: Return Appointment in 1 week. Wound #3 Left,Plantar Metatarsal head first: Return Appointment in 1 week. Off-Loading: Open toe surgical shoe to: - front off-loader to left Open toe surgical shoe with peg assist. - right Additional Orders / Instructions: Wound #1 Right,Plantar Foot: Increase protein intake. Wound #2 Right,Lateral,Plantar Metatarsal head fifth: Increase protein intake. Wound #3 Left,Plantar Metatarsal head first: Increase protein intake. 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 Leanord Hawking but then the patient was lost to follow-up for about a month. I have recommended that we stop the Regranex which he has been using for over 8 months now. After sharply debriding the wounds I have recommended that the use  Sorbact to be packed into both these wounds and apply some felt off loading. He has been using a Pegasys shoe on the right and a Darco front off loading shoe on the left. He would benefit from a total contact cast but because he has a problem bilaterally we will try and work 1 foot at a time. I have recommended that he should follow-up on a weekly basis patient if he has a total contact cast and never to cut it off himself as he has done the last time around. He says he will be compliant. 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 Electronic Signature(s) Signed: 02/28/2015 4:06:18 PM By: Evlyn Kanner MD, FACS Previous Signature: 02/28/2015 11:45:08 AM Version By: Evlyn Kanner MD, FACS Previous Signature: 02/28/2015 11:27:00 AM Version By: Evlyn Kanner MD, FACS Entered By: Evlyn Kanner on 02/28/2015 16:06:18 RED, MANDT ([id number]) JADAVION, SPOELSTRA ([id number]) -------------------------------------------------------------------------------- ROS/PFSH Details Patient Name: [name]. Date of Service: 02/28/2015 9:45 AM Medical Record Number: [id number] Patient Account Number: [id number] Date of Birth/Sex: [birth year] (49 y.o. Male) Treating RN: Huel Coventry Primary Care Physician: Other Clinician: Referring Physician:  Merwyn Katos Treating Physician/Extender: Rudene Re in Treatment: 0 Information Obtained From Chart Wound History Do you currently have one or more open woundso Yes How many open wounds do you currently haveo 3 Approximately how long have you had your woundso 1 year How have you been treating your wound(s) until nowo regranex Has your wound(s) ever healed and then re-openedo No Have you had any lab work done in the past montho No Have you tested positive for an antibiotic resistant organism (MRSA, VRE)o No Have you tested positive for osteomyelitis (bone infection)o No Have you had any tests  for circulation on your legso No Constitutional Symptoms (General Health) Complaints and Symptoms: Negative for: Fatigue; Fever; Chills; Marked Weight Change Medical History: Past Medical History Notes: Neuropathy; gout, htn; hld; diabetic; back pain Eyes Complaints and Symptoms: Positive for: Vision Changes - blurriness; Glasses / Contacts Negative for: Dry Eyes Medical History: Negative for: Cataracts; Glaucoma; Optic Neuritis Gastrointestinal Complaints and Symptoms: Positive for: Vomiting Negative for: Frequent diarrhea; Nausea Medical History: Negative for: Cirrhosis ; Colitis; Crohnos; Hepatitis A; Hepatitis B; Hepatitis C Integumentary (Skin) RAZA, BAYLESS ([id number]) Complaints and Symptoms: Positive for: Wounds Medical History: Negative for: History of Burn; History of pressure wounds Neurologic Complaints and Symptoms: Positive for: Numbness/parasthesias - mild left upper extremity Medical History: Positive for: Neuropathy - lower extremities 2009 Negative for: Dementia; Quadriplegia; Paraplegia; Seizure Disorder Psychiatric Complaints and Symptoms: Positive for: Claustrophobia - mild Negative for: Anxiety Medical History: Positive for: Confinement Anxiety - minor Negative for: Anorexia/bulimia Ear/Nose/Mouth/Throat Complaints and Symptoms: No Complaints or Symptoms Medical History: Positive for: Chronic sinus problems/congestion - seasonal allergies Negative for: Middle ear problems Hematologic/Lymphatic Complaints and Symptoms: No Complaints or Symptoms Medical History: Positive for: Anemia - 2 years Negative for: Hemophilia; Human Immunodeficiency Virus; Lymphedema; Sickle Cell Disease Respiratory Complaints and Symptoms: No Complaints or Symptoms Medical History: Positive for: Asthma - life Negative for: Aspiration; Chronic Obstructive Pulmonary Disease (COPD); Pneumothorax; Sleep Apnea; BRAYDN, CARNEIRO  ([id number]) Tuberculosis Cardiovascular Complaints and Symptoms: No Complaints or Symptoms Medical History: Positive for: Hypertension - 10 years; Myocardial Infarction - Minor MI 2012 Negative for: Angina; Arrhythmia; Congestive Heart Failure; Coronary Artery Disease; Deep Vein Thrombosis; Hypotension; Peripheral Arterial Disease; Peripheral Venous Disease; Phlebitis; Vasculitis Endocrine Complaints and Symptoms: No Complaints or Symptoms Medical History: Positive for: Type II Diabetes - 15 years Negative for: Type I Diabetes Time with diabetes: 15 years Treated with: Oral agents Blood sugar tested every day: Yes Tested : once daily Genitourinary Complaints and Symptoms: No Complaints or Symptoms Medical History: Negative for: End Stage Renal Disease Immunological Complaints and Symptoms: No Complaints or Symptoms Medical History: Negative for: Lupus Erythematosus; Raynaudos Musculoskeletal Medical History: Positive for: Gout - bilateral feet; Rheumatoid Arthritis - 2010; Osteoarthritis - 2010 Negative for: Osteomyelitis Oncologic Complaints and Symptoms: No Complaints or Symptoms BRASON, BERTHELOT ([id number]) Medical History: Negative for: Received Chemotherapy; Received Radiation HBO Extended History Items Ear/Nose/Mouth/Throat: Chronic sinus problems/congestion Family and Social History Cancer: Yes - Mother, Maternal Grandparents; Diabetes: Yes - Father; Heart Disease: Yes - Father; Hypertension: Yes - Father; Kidney Disease: No; Lung Disease: No; Stroke: No; Thyroid Problems: No; Former smoker; Marital Status - Divorced; Alcohol Use: Never; Drug Use: No History; Caffeine Use: Never; Living Will: No; Medical Power of Attorney: No Physician Affirmation I have reviewed and agree with the above information. Electronic Signature(s) Signed: 02/28/2015 11:56:10 AM By: Elliot Gurney RN, BSN, Kim RN, BSN Signed: 02/28/2015 4:04:17 PM By: Evlyn Kanner MD, FACS Previous  Signature: 02/28/2015 10:25:16 AM Version By: Evlyn Kanner MD, FACS Entered By: Elliot Gurney RN, BSN, Kim on 02/28/2015 10:36:24 [name] ([id number]) -------------------------------------------------------------------------------- SuperBill Details Patient Name: [name]. Date of Service: 02/28/2015 Medical Record Number: [id number] Patient Account Number: [id number] Date of Birth/Sex: Aug 12, 1965 (49 y.o. Male) Treating RN: Huel Coventry Primary Care Physician: Other Clinician: Referring Physician: Merwyn Katos Treating Physician/Extender: Rudene Re in Treatment: 0 Diagnosis Coding ICD-10 Codes Code Description [id number] Type 2 diabetes mellitus with foot ulcer E11.42 Type 2 diabetes mellitus with diabetic polyneuropathy L97.512 Non-pressure chronic ulcer of other part of right foot with fat layer exposed L97.522 Non-pressure chronic ulcer of other part of left foot with fat layer exposed L84 Corns and callosities Facility Procedures CPT4 Code Description: 21308657 99213 - WOUND CARE VISIT-LEV 3 EST PT Modifier: Quantity: 1 CPT4 Code Description: 84696295 11042 - DEB SUBQ TISSUE 20 SQ CM/< ICD-10 Description Diagnosis E11.621 Type 2 diabetes mellitus with foot ulcer E11.42 Type 2 diabetes mellitus with diabetic polyneuropathy L97.522 Non-pressure chronic ulcer of other part  of left foot L97.512 Non-pressure chronic ulcer of other part of right foo Modifier: with fat lay t with fat la Quantity: 1 er exposed yer exposed Physician Procedures CPT4 Code Description: 2841324 99214 - WC PHYS LEVEL 4 - EST PT ICD-10 Description Diagnosis E11.621 Type 2 diabetes mellitus with foot ulcer E11.42 Type 2 diabetes mellitus with diabetic polyneuropathy L97.512 Non-pressure chronic ulcer of other part of  right foo L97.522 Non-pressure chronic ulcer of other part of left foot Modifier: t with fat lay with fat laye Quantity: 1 er exposed r exposed CPT4 Code Description: [id number]  11042 - WC PHYS SUBQ TISS 20 SQ CM ICD-10 Description Diagnosis E11.621 Type 2 diabetes mellitus with foot ulcer VENTURA, HOLLENBECK ([id number]) Modifier: Quantity: 1 Electronic Signature(s) Signed: 02/28/2015 11:45:51 AM By: Evlyn Kanner MD, FACS Entered By: Evlyn Kanner on 02/28/2015 11:45:51

## 2015-03-07 ENCOUNTER — Encounter: Payer: Medicaid Other | Admitting: Surgery

## 2015-03-07 DIAGNOSIS — E11621 Type 2 diabetes mellitus with foot ulcer: Secondary | ICD-10-CM | POA: Diagnosis not present

## 2015-03-09 NOTE — Progress Notes (Signed)
[name] ([id number]) Visit Report for 03/07/2015 Arrival Information Details Patient Name: [name], [name]. Date of Service: 03/07/2015 11:00 AM Medical Record Number: [id number] Patient Account Number: [id number] Date of Birth/Sex: Sep 03, 1965 (49 y.o. Male) Treating RN: [name] Primary Care Physician: Other Clinician: Referring Physician: Merwyn [name] Treating Physician/Extender: [name] in Treatment: 1 Visit Information History Since Last Visit Added or deleted any medications: No Patient Arrived: Ambulatory Any new allergies or adverse reactions: No Arrival Time: 10:59 Had a fall or experienced change in No Accompanied By: self activities of daily living that may affect Transfer Assistance: None risk of falls: Patient Identification Verified: Yes Signs or symptoms of abuse/neglect since last No Secondary Verification Process Yes visito Completed: Hospitalized since last visit: No Patient Has Alerts: Yes Has Footwear/Offloading in Place as Yes Patient Alerts: Type II Prescribed: Diabetic Left: Wedge Shoe Right: Wedge Shoe Pain Present Now: Yes Electronic Signature(s) Signed: 03/07/2015 5:49:44 PM By: William Gurney, RN, BSN, Kim RN, BSN Entered By: William Gurney, RN, BSN, [name] on 03/07/2015 11:04:16 [name] ([id number]) -------------------------------------------------------------------------------- Encounter Discharge Information Details Patient Name: [name]. Date of Service: 03/07/2015 11:00 AM Medical Record Number: [id number] Patient Account Number: [id number] Date of Birth/Sex: January 13, 1966 (49 y.o. Male) Treating RN: [name] Primary Care Physician: Other Clinician: Referring Physician: Merwyn [name] Treating Physician/Extender: [name] in Treatment: 1 Encounter Discharge Information Items Discharge Pain Level: 0 Discharge Condition: Stable Ambulatory Status: Ambulatory Discharge Destination: Home Transportation:  Private Auto Accompanied By: self Schedule Follow-up Appointment: Yes Medication Reconciliation completed and provided to Patient/Care Yes [name]: Provided on Clinical Summary of Care: 03/07/2015 Form Type Recipient Paper Patient [name] Electronic Signature(s) Signed: 03/07/2015 11:42:58 AM By: [name] Entered By: [name] on 03/07/2015 11:42:58 [name] ([id number]) -------------------------------------------------------------------------------- Lower Extremity Assessment Details Patient Name: [name]. Date of Service: 03/07/2015 11:00 AM Medical Record Number: [id number] Patient Account Number: [id number] Date of Birth/Sex: [birth year] (49 y.o. Male) Treating RN: [name] Primary Care Physician: Other Clinician: Referring Physician: Merwyn [name] Treating Physician/Extender: [name] in Treatment: 1 Vascular Assessment Pulses: Posterior Tibial Dorsalis Pedis Palpable: [Left:Yes] [Right:Yes] Extremity colors, hair growth, and conditions: Extremity Color: [Left:Normal] [Right:Normal] Hair Growth on Extremity: [Left:Yes] [Right:Yes] Temperature of Extremity: [Left:Warm] [Right:Warm] Capillary Refill: [Left:< 3 seconds] [Right:< 3 seconds] Toe Nail Assessment Left: Right: Thick: No No Discolored: No No Deformed: No No Improper Length and Hygiene: No No Electronic Signature(s) Signed: 03/07/2015 5:49:44 PM By: William Gurney, RN, BSN, Kim RN, BSN Entered By: William Gurney, RN, BSN, [name] on 03/07/2015 11:08:03 [name] ([id number]) -------------------------------------------------------------------------------- Multi Wound Chart Details Patient Name: [name]. Date of Service: 03/07/2015 11:00 AM Medical Record Number: [id number] Patient Account Number: [id number] Date of Birth/Sex: Jun 27, 1965 (49 y.o. Male) Treating RN: [name] Primary Care Physician: Other Clinician: Referring Physician: Merwyn [name] Treating Physician/Extender:  [name] in Treatment: 1 Vital Signs Height(in): 72 Pulse(bpm): 60 Weight(lbs): 228 Blood Pressure 113/73 (mmHg): Body Mass Index(BMI): 31 Temperature(F): 97.9 Respiratory Rate 18 (breaths/min): Photos: [1:No Photos] [2:No Photos] [3:No Photos] Wound Location: [1:Right, Plantar Foot] [2:Right, Lateral, Plantar Metatarsal head fifth] [3:Left, Plantar Metatarsal head first] Wounding Event: [1:Pressure Injury] [2:Pressure Injury] [3:Pressure Injury] Primary Etiology: [1:Diabetic Wound/Ulcer of the Lower Extremity] [2:Diabetic Wound/Ulcer of the Lower Extremity] [3:Diabetic Wound/Ulcer of the Lower Extremity] 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 Ulcer] [2:Pressure Ulcer] [3:Pressure Ulcer] Date Acquired: [1:02/18/2014] [2:02/18/2014] [3:02/18/2014] Weeks of Treatment: [1:1] [2:1] [3:1] Wound Status: [1:Open] [2:Open] [3:Open] Measurements L x W x D 1.4x0.9x0.7 [2:0.2x0.2x0.1] [3:1.2x1x0.7] (  cm) Area (cm) : [1:0.99] [2:0.031] [3:0.942] Volume (cm) : [1:0.693] [2:0.003] [3:0.66] % Reduction in Area: [1:0.00%] [2:81.20%] [3:-9.00%] % Reduction in Volume: 0.00% [2:81.30%] [3:-90.80%] Classification: [1:Grade 2] [2:Grade 1] [3:Grade 2] Periwound Skin Texture: No Abnormalities Noted [2:No Abnormalities Noted] [3:No Abnormalities Noted] Periwound Skin [1:No Abnormalities Noted] [2:No Abnormalities Noted] [3:No Abnormalities Noted] Moisture: Periwound Skin Color: No Abnormalities Noted [2:No Abnormalities Noted] [3:No Abnormalities Noted] Tenderness on [1:No] [2:No] [3:No] Treatment Notes Electronic Signature(s) Signed: 03/07/2015 5:49:44 PM By: William Gurney, RN, BSN, Kim RN, BSN [name] Kitchen ([id number]) Entered By: William Gurney, RN, BSN, [name] on 03/07/2015 11:14:35 [name] ([id number]) -------------------------------------------------------------------------------- Multi-Disciplinary Care Plan Details Patient Name: [name]. Date of Service: 03/07/2015 11:00 AM Medical  Record Number: [id number] Patient Account Number: [id number] Date of Birth/Sex: 01/17/1966 (49 y.o. Male) Treating RN: [name] Primary Care Physician: Other Clinician: Referring Physician: Merwyn [name] Treating Physician/Extender: [name] in Treatment: 1 Active Inactive Abuse / Safety / Falls / Self Care Management Nursing Diagnoses: Potential for falls Goals: Patient will remain injury free Date Initiated: 02/28/2015 Goal Status: Active Interventions: Assess fall risk on admission and as needed Notes: Nutrition Nursing Diagnoses: Impaired glucose control: actual or potential Goals: Patient/caregiver agrees to and verbalizes understanding of need to obtain nutritional consultation Date Initiated: 02/28/2015 Goal Status: Active Interventions: Assess patient nutrition upon admission and as needed per policy Notes: Orientation to the Wound Care Program Nursing Diagnoses: Knowledge deficit related to the wound healing center program Goals: Patient/caregiver will verbalize understanding of the Wound Healing Center Program Date Initiated: 02/28/2015 COSTAS, SENA ([id number]) Goal Status: Active Interventions: Provide education on orientation to the wound center Notes: Pressure Nursing Diagnoses: Potential for impaired tissue integrity related to pressure, friction, moisture, and shear Goals: Patient will remain free of pressure ulcers Date Initiated: 02/28/2015 Goal Status: Active Interventions: Assess: immobility, friction, shearing, incontinence upon admission and as needed Notes: Wound/Skin Impairment Nursing Diagnoses: Impaired tissue integrity Goals: Ulcer/skin breakdown will heal within 14 weeks Date Initiated: 02/28/2015 Goal Status: Active Interventions: Assess patient/caregiver ability to obtain necessary supplies Notes: Electronic Signature(s) Signed: 03/07/2015 5:49:44 PM By: William Gurney, RN, BSN, Kim RN, BSN Entered By: William Gurney, RN, BSN, [name]  on 03/07/2015 11:14:29 [name] ([id number]) -------------------------------------------------------------------------------- Pain Assessment Details Patient Name: [name]. Date of Service: 03/07/2015 11:00 AM Medical Record Number: [id number] Patient Account Number: [id number] Date of Birth/Sex: [birth year] (49 y.o. Male) Treating RN: [name] Primary Care Physician: Other Clinician: Referring Physician: Merwyn [name] Treating Physician/Extender: [name] in Treatment: 1 Active Problems Location of Pain Severity and Description of Pain Patient Has Paino Yes Site Locations Pain Location: Pain in Ulcers Rate the pain. Current Pain Level: 9 Character of Pain Describe the Pain: Tender, Throbbing Pain Management and Medication Current Pain Management: Medication: No Cold Application: No Rest: No Massage: No Activity: No T.E.N.S.: No Heat Application: No Leg drop or elevation: No Is the Current Pain Management Inadequate Adequate: How does your pain impact your activities of daily livingo Sleep: No Bathing: No Appetite: No Relationship With Others: No Bladder Continence: No Emotions: No Bowel Continence: No Work: No Toileting: No Drive: No Dressing: No Hobbies: No Electronic Signature(s) Signed: 03/07/2015 5:49:44 PM By: William Gurney, RN, BSN, Kim RN, BSN [place], Mason [name] Kitchen ([id number]) Entered By: William Gurney, RN, BSN, [name] on 03/07/2015 11:04:34 [name] ([id number]) -------------------------------------------------------------------------------- Patient/Caregiver Education Details Patient Name: [name]. Date of Service: 03/07/2015 11:00 AM Medical Record Number: [id number] Patient Account Number: [id number] Date of Birth/Gender:  08/02/1965 (49 y.o. Male) Treating RN: [name] Primary Care Physician: Other Clinician: Referring Physician: Merwyn [name] Treating Physician/Extender: [name] in Treatment: 1 Education  Assessment Education Provided To: Patient Education Topics Provided Wound/Skin Impairment: Handouts: Caring for Your Ulcer, Other: continue wound care as prescribed Methods: Demonstration, Explain/Verbal Responses: State content correctly Electronic Signature(s) Signed: 03/07/2015 5:49:44 PM By: William Gurney, RN, BSN, Kim RN, BSN Entered By: William Gurney, RN, BSN, [name] on 03/07/2015 11:37:04 [name] ([id number]) -------------------------------------------------------------------------------- Wound Assessment Details Patient Name: [name]. Date of Service: 03/07/2015 11:00 AM Medical Record Number: [id number] Patient Account Number: [id number] Date of Birth/Sex: [birth year] (49 y.o. Male) Treating RN: [name] Primary Care Physician: Other Clinician: Referring Physician: Merwyn [name] Treating Physician/Extender: [name] in Treatment: 1 Wound Status Wound Number: 1 Primary Etiology: Diabetic Wound/Ulcer of the Lower Extremity Wound Location: Right, Plantar Foot Secondary Pressure Ulcer Wounding Event: Pressure Injury Etiology: Date Acquired: 02/18/2014 Wound Status: Open Weeks Of Treatment: 1 Clustered Wound: No Photos Photo Uploaded By: William Gurney, RN, BSN, [name] on 03/07/2015 15:10:23 Wound Measurements Length: (cm) 1.4 Width: (cm) 0.9 Depth: (cm) 0.7 Area: (cm) 0.99 Volume: (cm) 0.693 % Reduction in Area: 0% % Reduction in Volume: 0% Wound Description Classification: Grade 2 Periwound Skin Texture Texture Color No Abnormalities Noted: No No Abnormalities Noted: No Moisture No Abnormalities Noted: No Treatment Notes Wound #1 (Right, Plantar Foot) 1. Cleansed withSLAYDEN, SCHUPP ([id number]) Clean wound with Normal Saline 2. Anesthetic Topical Lidocaine 4% cream to wound bed prior to debridement 4. Dressing Applied: Other dressing (specify in notes) 6. Footwear/Offloading device applied Surgical shoe Wedge shoe Notes Sorbact; drawtex;  gauze Electronic Signature(s) Signed: 03/07/2015 5:49:44 PM By: William Gurney, RN, BSN, Kim RN, BSN Entered By: William Gurney, RN, BSN, [name] on 03/07/2015 11:11:03 BIRT, BIEDRZYCKI ([id number]) -------------------------------------------------------------------------------- Wound Assessment Details Patient Name: [name]. Date of Service: 03/07/2015 11:00 AM Medical Record Number: [id number] Patient Account Number: [id number] Date of Birth/Sex: April 20, 1966 (49 y.o. Male) Treating RN: [name] Primary Care Physician: Other Clinician: Referring Physician: Merwyn [name] Treating Physician/Extender: [name] in Treatment: 1 Wound Status Wound Number: 2 Primary Etiology: Diabetic Wound/Ulcer of the Lower Extremity Wound Location: Right, Lateral, Plantar Metatarsal head fifth Secondary Pressure Ulcer Etiology: Wounding Event: Pressure Injury Wound Status: Open Date Acquired: 02/18/2014 Weeks Of Treatment: 1 Clustered Wound: No Photos Photo Uploaded By: William Gurney, RN, BSN, [name] on 03/07/2015 15:10:23 Wound Measurements Length: (cm) 0.2 Width: (cm) 0.2 Depth: (cm) 0.1 Area: (cm) 0.031 Volume: (cm) 0.003 % Reduction in Area: 81.2% % Reduction in Volume: 81.3% Wound Description Classification: Grade 1 Periwound Skin Texture Texture Color No Abnormalities Noted: No No Abnormalities Noted: No Moisture No Abnormalities Noted: No Treatment Notes Wound #2 (Right, Lateral, Plantar Metatarsal head fifth) Girardin, Pedro D. ([id number]) 1. Cleansed with: Clean wound with Normal Saline 2. Anesthetic Topical Lidocaine 4% cream to wound bed prior to debridement 4. Dressing Applied: Other dressing (specify in notes) 6. Footwear/Offloading device applied Surgical shoe Wedge shoe Notes Sorbact; drawtex; gauze Electronic Signature(s) Signed: 03/07/2015 5:49:44 PM By: William Gurney, RN, BSN, Kim RN, BSN Entered By: William Gurney, RN, BSN, [name] on 03/07/2015 11:11:04 ELZA, ORN  ([id number]) -------------------------------------------------------------------------------- Wound Assessment Details Patient Name: [name]. Date of Service: 03/07/2015 11:00 AM Medical Record Number: [id number] Patient Account Number: [id number] Date of Birth/Sex: 04-17-1966 (49 y.o. Male) Treating RN: [name] Primary Care Physician: Other Clinician: Referring Physician: Merwyn [name] Treating Physician/Extender: [name] in Treatment: 1 Wound Status Wound Number:  3 Primary Etiology: Diabetic Wound/Ulcer of the Lower Extremity Wound Location: Left, Plantar Metatarsal head first Secondary Pressure Ulcer Etiology: Wounding Event: Pressure Injury Wound Status: Open Date Acquired: 02/18/2014 Weeks Of Treatment: 1 Clustered Wound: No Photos Photo Uploaded By: William Gurney, RN, BSN, [name] on 03/07/2015 15:10:35 Wound Measurements Length: (cm) 1.2 Width: (cm) 1 Depth: (cm) 0.7 Area: (cm) 0.942 Volume: (cm) 0.66 % Reduction in Area: -9% % Reduction in Volume: -90.8% Wound Description Classification: Grade 2 Periwound Skin Texture Texture Color No Abnormalities Noted: No No Abnormalities Noted: No Moisture No Abnormalities Noted: No Treatment Notes Wound #3 (Left, Plantar Metatarsal head first) Arnaud, Javonta D. ([id number]) 1. Cleansed with: Clean wound with Normal Saline 2. Anesthetic Topical Lidocaine 4% cream to wound bed prior to debridement 4. Dressing Applied: Other dressing (specify in notes) 6. Footwear/Offloading device applied Surgical shoe Wedge shoe Notes Sorbact; drawtex; gauze Electronic Signature(s) Signed: 03/07/2015 5:49:44 PM By: William Gurney, RN, BSN, Kim RN, BSN Entered By: William Gurney, RN, BSN, [name] on 03/07/2015 11:11:04 GOTHAM, RADEN ([id number]) -------------------------------------------------------------------------------- Vitals Details Patient Name: [name]. Date of Service: 03/07/2015 11:00 AM Medical Record Number:  [id number] Patient Account Number: [id number] Date of Birth/Sex: 1965/05/11 (49 y.o. Male) Treating RN: [name] Primary Care Physician: Other Clinician: Referring Physician: Merwyn [name] Treating Physician/Extender: [name] in Treatment: 1 Vital Signs Time Taken: 11:04 Temperature (F): 97.9 Height (in): 72 Pulse (bpm): 60 Weight (lbs): 228 Respiratory Rate (breaths/min): 18 Body Mass Index (BMI): 30.9 Blood Pressure (mmHg): 113/73 Reference Range: 80 - 120 mg / dl Electronic Signature(s) Signed: 03/07/2015 5:49:44 PM By: William Gurney, RN, BSN, Kim RN, BSN Entered By: William Gurney, RN, BSN, [name] on 03/07/2015 11:07:25

## 2015-03-09 NOTE — Progress Notes (Signed)
ASHLAND, WISEMAN ([id number]) Visit Report for 03/07/2015 Chief Complaint Document Details Patient Name: [name], [name]. Date of Service: 03/07/2015 11:00 AM Medical Record Number: [id number] Patient Account Number: [id number] Date of Birth/Sex: [birth year] (49 y.o. Male) Treating RN: Huel Coventry Primary Care Physician: Other Clinician: Referring Physician: Merwyn Katos Treating Physician/Extender: Rudene Re in Treatment: 1 Information Obtained from: Patient Chief Complaint Patients presents for treatment of an open diabetic ulcer. He presents with ulcers on both feet which she's had for over a year and a half. He has been seen at the wound care center at [facility] and was a patient of Dr. Leanord Hawking for about 2 months Electronic Signature(s) Signed: 03/07/2015 11:38:26 AM By: Evlyn Kanner MD, FACS Entered By: Evlyn Kanner on 03/07/2015 11:38:25 [name] ([id number]) -------------------------------------------------------------------------------- Debridement Details Patient Name: [name]. Date of Service: 03/07/2015 11:00 AM Medical Record Number: [id number] Patient Account Number: [id number] Date of Birth/Sex: Dec 06, 1965 (49 y.o. Male) Treating RN: Huel Coventry Primary Care Physician: Other Clinician: Referring Physician: Merwyn Katos Treating Physician/Extender: Rudene Re in Treatment: 1 Debridement Performed for Wound #1 Right,Plantar Foot Assessment: Performed By: Physician Evlyn Kanner, MD Debridement: Debridement Pre-procedure Yes Verification/Time Out Taken: Start Time: 11:20 Pain Control: Other : lidocaine 4% Level: Skin/Subcutaneous Tissue Total Area Debrided (L x 1.4 (cm) x 0.9 (cm) = 1.26 (cm) W): Tissue and other Viable, Non-Viable, Callus, Eschar, Exudate, Fat, Fibrin/Slough, material debrided: Subcutaneous Instrument: Curette Bleeding: Moderate Hemostasis Achieved: Pressure End Time: 11:35 Procedural Pain: 0 Post  Procedural Pain: 0 Response to Treatment: Procedure was tolerated well Post Debridement Measurements of Total Wound Length: (cm) 1.4 Width: (cm) 0.9 Depth: (cm) 0.8 Volume: (cm) 0.792 Post Procedure Diagnosis Same as Pre-procedure Electronic Signature(s) Signed: 03/07/2015 11:38:05 AM By: Evlyn Kanner MD, FACS Signed: 03/07/2015 5:49:44 PM By: Elliot Gurney RN, BSN, Kim RN, BSN Entered By: Evlyn Kanner on 03/07/2015 11:38:05 XABI, WITTLER ([id number]) -------------------------------------------------------------------------------- Debridement Details Patient Name: [name]. Date of Service: 03/07/2015 11:00 AM Medical Record Number: [id number] Patient Account Number: [id number] Date of Birth/Sex: 05/20/1965 (50 y.o. Male) Treating RN: Huel Coventry Primary Care Physician: Other Clinician: Referring Physician: Merwyn Katos Treating Physician/Extender: Rudene Re in Treatment: 1 Debridement Performed for Wound #3 Left,Plantar Metatarsal head first Assessment: Performed By: Physician Evlyn Kanner, MD Debridement: Debridement Pre-procedure Yes Verification/Time Out Taken: Start Time: 11:20 Pain Control: Other : lidocaine 4% Level: Skin/Subcutaneous Tissue Total Area Debrided (L x 1.2 (cm) x 1 (cm) = 1.2 (cm) W): Tissue and other Viable, Non-Viable, Callus, Eschar, Exudate, Fat, Fibrin/Slough, material debrided: Subcutaneous Instrument: Curette Bleeding: Moderate Hemostasis Achieved: Pressure End Time: 11:30 Procedural Pain: 0 Post Procedural Pain: 0 Response to Treatment: Procedure was tolerated well Post Debridement Measurements of Total Wound Length: (cm) 1.2 Width: (cm) 1 Depth: (cm) 0.7 Volume: (cm) 0.66 Post Procedure Diagnosis Same as Pre-procedure Electronic Signature(s) Signed: 03/07/2015 11:38:13 AM By: Evlyn Kanner MD, FACS Signed: 03/07/2015 5:49:44 PM By: Elliot Gurney RN, BSN, Kim RN, BSN Entered By: Evlyn Kanner on 03/07/2015  11:38:13 CAVION, FAIOLA ([id number]) -------------------------------------------------------------------------------- HPI Details Patient Name: [name]. Date of Service: 03/07/2015 11:00 AM Medical Record Number: [id number] Patient Account Number: [id number] Date of Birth/Sex: August 01, 1965 (49 y.o. Male) Treating RN: Huel Coventry Primary Care Physician: Other Clinician: Referring Physician: Merwyn Katos Treating Physician/Extender: Rudene Re in Treatment: 1 History of Present Illness Location: ulcers on the plantar aspect of both feet for over a year and a half Quality: Patient reports No Pain. Severity: Patient states  wound are getting worse. Duration: Patient has had the wound for > 18 months prior to seeking treatment at the wound center Context: The wound appeared gradually over time Modifying Factors: Other treatment(s) tried include: his podiatrist has been treating him with Regranex and he has been seen at the [facility] wound care center and treated with surgical debridement, antibiotics and total contact cast. Associated Signs and Symptoms: Patient reports having increase discharge. HPI Description: This is a 49 year old man who comes in for review of wounds on his bilateral plantar feet which are been present for one and a half year. he has been seen at the [facility] wound care center by Dr. Leanord Hawking and was treated there for a couple of months and I'm not entirely sure why he changed to this center. He says it was difficult for him to get an appointment due to shortage of staff. The last time around he had a total contact cast which he cut himself because he was unable to go back to [place]. He has severe diabetic neuropathy with lower extremity pain. He has had a wound over his left plantar first metatarsal head and a wound on the right foot at roughly the second and third plantar metatarsal heads. He has a Darco sandal on the left and a healing sandal on  the right. For roughly the last 8 months. He has been using Regranex and this was been followed by his podiatrist at [facility] Petery]. He has had multiple x-rays of his feet according to the patient that have not shown infection. He has not had an MRI. He was treated with doxycycline and Septra in the past for a staph aureus infection. He has never been a smoker. he goes to a pain clinic for pain medications and is regularly seen for his primary care needs by his PCP. Electronic Signature(s) Signed: 03/07/2015 11:40:04 AM By: Evlyn Kanner MD, FACS Previous Signature: 03/07/2015 11:39:32 AM Version By: Evlyn Kanner MD, FACS Entered By: Evlyn Kanner on 03/07/2015 11:40:04 [name] ([id number]) -------------------------------------------------------------------------------- Physical Exam Details Patient Name: [name]. Date of Service: 03/07/2015 11:00 AM Medical Record Number: [id number] Patient Account Number: [id number] Date of Birth/Sex: [birth year] (49 y.o. Male) Treating RN: Huel Coventry Primary Care Physician: Other Clinician: Referring Physician: Merwyn Katos Treating Physician/Extender: Rudene Re in Treatment: 1 Constitutional . Pulse regular. Respirations normal and unlabored. Afebrile. . Eyes Nonicteric. Reactive to light. Ears, Nose, Mouth, and Throat Lips, teeth, and gums WNL.Marland Kitchen Moist mucosa without lesions . Neck supple and nontender. No palpable supraclavicular or cervical adenopathy. Normal sized without goiter. Respiratory WNL. No retractions.. Cardiovascular Pedal Pulses WNL. No clubbing, cyanosis or edema. Chest Breasts symmetical and no nipple discharge.. Breast tissue WNL, no masses, lumps, or tenderness.. Lymphatic No adneopathy. No adenopathy. No adenopathy. Musculoskeletal Adexa without tenderness or enlargement.. Digits and nails w/o clubbing, cyanosis, infection, petechiae, ischemia, or inflammatory  conditions.. Integumentary (Hair, Skin) No suspicious lesions. No crepitus or fluctuance. No peri-wound warmth or erythema. No masses.Marland Kitchen Psychiatric Judgement and insight Intact.. No evidence of depression, anxiety, or agitation.. Notes On the plantar aspect of the right foot he has a large amount of callus and a deep ulcer down to the subcutaneous tissue. This will require sharp debridement. On the left foot he has similarly a rather large ulcerated area down to the subcutaneous tissues surrounded by significant callus and this will need sharp debridement too. A curette was used to do this. Electronic Signature(s) Signed: 03/07/2015 11:41:19 AM  By: Evlyn Kanner MD, FACS Entered By: Evlyn Kanner on 03/07/2015 11:41:19 [name] ([id number]) -------------------------------------------------------------------------------- Physician Orders Details Patient Name: [name]. Date of Service: 03/07/2015 11:00 AM Medical Record Number: [id number] Patient Account Number: [id number] Date of Birth/Sex: 1966/03/01 (49 y.o. Male) Treating RN: Huel Coventry Primary Care Physician: Other Clinician: Referring Physician: Merwyn Katos Treating Physician/Extender: Rudene Re in Treatment: 1 Verbal / Phone Orders: Yes Clinician: Huel Coventry Read Back and Verified: Yes Diagnosis Coding Wound Cleansing Wound #1 Right,Plantar Foot o Clean wound with Normal Saline. Wound #2 Right,Lateral,Plantar Metatarsal head fifth o Clean wound with Normal Saline. Wound #3 Left,Plantar Metatarsal head first o Clean wound with Normal Saline. Anesthetic Wound #1 Right,Plantar Foot o Topical Lidocaine 4% cream applied to wound bed prior to debridement Wound #2 Right,Lateral,Plantar Metatarsal head fifth o Topical Lidocaine 4% cream applied to wound bed prior to debridement Wound #3 Left,Plantar Metatarsal head first o Topical Lidocaine 4% cream applied to wound bed prior to  debridement Primary Wound Dressing Wound #1 Right,Plantar Foot o Other: - sorbact Wound #2 Right,Lateral,Plantar Metatarsal head fifth o Other: - sorbact Wound #3 Left,Plantar Metatarsal head first o Other: - sorbact Secondary Dressing Wound #1 Right,Plantar Foot o Drawtex Wound #3 Left,Plantar Metatarsal head first o DEMETRIAS, GOODBAR ([id number]) Dressing Change Frequency Wound #1 Right,Plantar Foot o Change dressing every other day. Wound #3 Left,Plantar Metatarsal head first o Change dressing every other day. Follow-up Appointments Wound #1 Right,Plantar Foot o Return Appointment in 1 week. o Other: - Monday for cast Wound #2 Right,Lateral,Plantar Metatarsal head fifth o Return Appointment in 1 week. o Other: - Monday for cast Wound #3 Left,Plantar Metatarsal head first o Other: - Monday for cast Off-Loading o Open toe surgical shoe to: - front off-loader to left o Open toe surgical shoe with peg assist. - right Additional Orders / Instructions Wound #1 Right,Plantar Foot o Increase protein intake. Wound #2 Right,Lateral,Plantar Metatarsal head fifth o Increase protein intake. Wound #3 Left,Plantar Metatarsal head first o Increase protein intake. Electronic Signature(s) Signed: 03/07/2015 4:05:55 PM By: Evlyn Kanner MD, FACS Signed: 03/07/2015 5:49:44 PM By: Elliot Gurney RN, BSN, Kim RN, BSN Entered By: Elliot Gurney, RN, BSN, Kim on 03/07/2015 11:34:56 KHALEEF, RUBY ([id number]) -------------------------------------------------------------------------------- Problem List Details Patient Name: CALLUM, WOLF. Date of Service: 03/07/2015 11:00 AM Medical Record Number: [id number] Patient Account Number: [id number] Date of Birth/Sex: January 09, 1966 (49 y.o. 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 Date of Service: 03/07/2015 11:00 AM Medical Record Number: [id number] Patient Account Number: [id number] Date of Birth/Sex: Mar 07, 1966 (49 y.o. Male) Treating RN: Huel Coventry Primary Care Physician: Other Clinician: Referring Physician: Merwyn Katos Treating Physician/Extender: Rudene Re in Treatment: 1 Subjective Chief Complaint Information obtained from Patient Patients presents for treatment of an open diabetic ulcer. He presents with  ulcers on both feet which she's had for over a year and a half. He has been seen at the wound care center at [facility] and was a patient of Dr. Leanord Hawking for about 2 months History of Present Illness (HPI) The following HPI elements were documented for the patient's wound: Location: ulcers on the plantar aspect of both feet for over a year and a half Quality: Patient reports No Pain. Severity: Patient states wound are getting worse. Duration: Patient has had the wound for > 18 months prior to seeking treatment at the wound center Context: The wound appeared gradually over time Modifying Factors: Other treatment(s) tried include: his podiatrist has been treating him with  Regranex and he has been seen at the [facility] wound care center and treated with surgical debridement, antibiotics and total contact cast. Associated Signs and Symptoms: Patient reports having increase discharge. This is a 49 year old man who comes in for review of wounds on his bilateral plantar feet which are been present for one and a half year. he has been seen at the [facility] wound care center by Dr. Leanord Hawking and was treated there for a couple of months and I'm not entirely sure why he changed to this center. He says it was difficult for him to get an appointment due to shortage of staff. The last time around he had a total contact cast which he cut himself because he was unable to go back to [place]. He has severe diabetic neuropathy with lower extremity pain. He has had a wound over his left plantar first metatarsal head and a wound on the right foot at roughly the second and third plantar metatarsal heads. He has a Darco sandal on the left and a healing sandal on the right. For roughly the last 8 months. He has been using Regranex and this was been followed by his podiatrist at [place] [facility] Petery]. He has had multiple x-rays of his feet according to the patient that have not shown infection. He has not had an MRI. He was treated with doxycycline and Septra in the past for a staph aureus infection. He has never been a smoker. he goes to a pain clinic for pain medications and is regularly seen for his primary care needs by his PCP. DANNIE, HATTABAUGH ([id number]) Objective Constitutional Pulse regular. Respirations normal and unlabored. Afebrile. Vitals Time Taken: 11:04 AM, Height: 72 in, Weight: 228 lbs, BMI: 30.9, Temperature: 97.9 F, Pulse: 60 bpm, Respiratory Rate: 18 breaths/min, Blood Pressure: 113/73 mmHg. Eyes Nonicteric. Reactive to light. Ears, Nose, Mouth, and Throat Lips, teeth, and gums WNL.Marland Kitchen Moist mucosa without lesions . Neck supple and  nontender. No palpable supraclavicular or cervical adenopathy. Normal sized without goiter. Respiratory WNL. No retractions.. Cardiovascular Pedal Pulses WNL. No clubbing, cyanosis or edema. Chest Breasts symmetical and no nipple discharge.. Breast tissue WNL, no masses, lumps, or tenderness.. Lymphatic No adneopathy. No adenopathy. No adenopathy. Musculoskeletal Adexa without tenderness or enlargement.. Digits and nails w/o clubbing, cyanosis, infection, petechiae, ischemia, or inflammatory conditions.Marland Kitchen Psychiatric Judgement and insight Intact.. No evidence of depression, anxiety, or agitation.. General Notes: On the plantar aspect of the right foot he has a large amount of callus and a deep ulcer down to the subcutaneous tissue. This will require sharp debridement. On the left foot he has similarly a rather large ulcerated area down to the subcutaneous tissues surrounded by significant callus and this will need sharp debridement too. A curette was used to do this. Integumentary (Hair,  Skin) No suspicious lesions. No crepitus or fluctuance. No peri-wound warmth or erythema. No masses.. Wound #1 status is Open. Original cause of wound was Pressure Injury. The wound is located on the ARDIAN, [place]. ([id number]) Right,Plantar Foot. The wound measures 1.4cm length x 0.9cm width x 0.7cm depth; 0.99cm^2 area and 0.693cm^3 volume. Wound #2 status is Open. Original cause of wound was Pressure Injury. The wound is located on the Right,Lateral,Plantar Metatarsal head fifth. The wound measures 0.2cm length x 0.2cm width x 0.1cm depth; 0.031cm^2 area and 0.003cm^3 volume. Wound #3 status is Open. Original cause of wound was Pressure Injury. The wound is located on the Left,Plantar Metatarsal head first. The wound measures 1.2cm length x 1cm width x 0.7cm depth; 0.942cm^2 area and 0.66cm^3 volume. Assessment Active Problems ICD-10 E11.621 - Type 2 diabetes mellitus with foot ulcer E11.42 -  Type 2 diabetes mellitus with diabetic polyneuropathy L97.512 - Non-pressure chronic ulcer of other part of right foot with fat layer exposed L97.522 - Non-pressure chronic ulcer of other part of left foot with fat layer exposed L84 - Corns and callosities Procedures Wound #1 Wound #1 is a Diabetic Wound/Ulcer of the Lower Extremity located on the Right,Plantar Foot . There was a Skin/Subcutaneous Tissue Debridement ([id number]) debridement with total area of 1.26 sq cm performed by Evlyn Kanner, MD. with the following instrument(s): Curette to remove Viable and Non-Viable tissue/material including Exudate, Fat, Fibrin/Slough, Eschar, Callus, and Subcutaneous after achieving pain control using Other (lidocaine 4%). A time out was conducted prior to the start of the procedure. A Moderate amount of bleeding was controlled with Pressure. The procedure was tolerated well with a pain level of 0 throughout and a pain level of 0 following the procedure. Post Debridement Measurements: 1.4cm length x 0.9cm width x 0.8cm depth; 0.792cm^3 volume. Post procedure Diagnosis Wound #1: Same as Pre-Procedure Wound #3 Wound #3 is a Diabetic Wound/Ulcer of the Lower Extremity located on the Left,Plantar Metatarsal head first . There was a Skin/Subcutaneous Tissue Debridement ([id number]) debridement with total area of Mcqueary, Tami D. ([id number]) 1.2 sq cm performed by Evlyn Kanner, MD. with the following instrument(s): Curette to remove Viable and Non-Viable tissue/material including Exudate, Fat, Fibrin/Slough, Eschar, Callus, and Subcutaneous after achieving pain control using Other (lidocaine 4%). A time out was conducted prior to the start of the procedure. A Moderate amount of bleeding was controlled with Pressure. The procedure was tolerated well with a pain level of 0 throughout and a pain level of 0 following the procedure. Post Debridement Measurements: 1.2cm length x 1cm width x 0.7cm depth;  0.66cm^3 volume. Post procedure Diagnosis Wound #3: Same as Pre-Procedure Plan Wound Cleansing: Wound #1 Right,Plantar Foot: Clean wound with Normal Saline. Wound #2 Right,Lateral,Plantar Metatarsal head fifth: Clean wound with Normal Saline. Wound #3 Left,Plantar Metatarsal head first: Clean wound with Normal Saline. Anesthetic: Wound #1 Right,Plantar Foot: Topical Lidocaine 4% cream applied to wound bed prior to debridement Wound #2 Right,Lateral,Plantar Metatarsal head fifth: Topical Lidocaine 4% cream applied to wound bed prior to debridement Wound #3 Left,Plantar Metatarsal head first: Topical Lidocaine 4% cream applied to wound bed prior to debridement Primary Wound Dressing: Wound #1 Right,Plantar Foot: Other: - sorbact Wound #2 Right,Lateral,Plantar Metatarsal head fifth: Other: - sorbact Wound #3 Left,Plantar Metatarsal head first: Other: - sorbact Secondary Dressing: Wound #1 Right,Plantar Foot: Drawtex Wound #3 Left,Plantar Metatarsal head first: Drawtex Dressing Change Frequency: Wound #1 Right,Plantar Foot: Change dressing every other day. Wound #3 Left,Plantar Metatarsal head first: Change dressing every  other day. Follow-up Appointments: Wound #1 Right,Plantar Foot: Return Appointment in 1 week. Other: - Monday for cast BALIN, VANDEGRIFT ([id number]) Wound #2 Right,Lateral,Plantar Metatarsal head fifth: Return Appointment in 1 week. Other: - Monday for cast Wound #3 Left,Plantar Metatarsal head first: Other: - Monday for cast Off-Loading: Open toe surgical shoe to: - front off-loader to left Open toe surgical shoe with peg assist. - right Additional Orders / Instructions: Wound #1 Right,Plantar Foot: Increase protein intake. Wound #2 Right,Lateral,Plantar Metatarsal head fifth: Increase protein intake. Wound #3 Left,Plantar Metatarsal head first: Increase protein intake. After sharply debriding the wounds I have recommended that the use Sorbact to  be packed into both these wounds and apply some Drawtext as he has a lot of drainage and some felt off loading. He has been using a Pegasys shoe on the right and a Darco front off loading shoe on the left. He would benefit from a total contact cast but because he has a problem bilaterally we will try and work 1 foot at a time. Because of the holiday I have recommended that he comes on Monday to apply his total contact cast and that we will then follow him on a weekly basis. If he has a total contact cast he has been instructed never to cut it off himself, as he has done the last time around. He says he will be compliant. 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 Electronic Signature(s) Signed: 03/07/2015 11:46:22 AM By: Evlyn Kanner MD, FACS Entered By: Evlyn Kanner on 03/07/2015 11:46:22 [name] ([id number]) -------------------------------------------------------------------------------- SuperBill Details Patient Name: [name]. Date of Service: 03/07/2015 Medical Record Number: [id number] Patient Account Number: [id number] Date of Birth/Sex: 03-Feb-1966 (49 y.o. 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.522 Non-pressure chronic ulcer of other part of left foot with fat layer exposed L84 Corns and callosities Facility Procedures CPT4 Code Description: [id number] 11042 - DEB SUBQ TISSUE 20 SQ CM/< ICD-10 Description Diagnosis E11.621 Type 2 diabetes mellitus with foot ulcer E11.42 Type 2 diabetes mellitus with diabetic polyneuropathy L97.512  Non-pressure chronic ulcer of other part  of right foo L97.522 Non-pressure chronic ulcer of other part of left foot Modifier: t with fat la with fat lay Quantity: 1 yer exposed er exposed Physician Procedures CPT4 Code Description: [id number] 11042 - WC PHYS SUBQ TISS 20 SQ CM ICD-10 Description Diagnosis E11.621 Type 2 diabetes mellitus with foot ulcer E11.42 Type 2 diabetes mellitus with diabetic polyneuropathy L97.512 Non-pressure chronic ulcer of other part  of right foo L97.522 Non-pressure chronic ulcer of other part of left foot Modifier: t with fat lay with fat laye Quantity: 1 er exposed r exposed Electronic Signature(s) Signed: 03/07/2015 11:46:34 AM By: Evlyn Kanner MD, FACS Entered By: Evlyn Kanner on 03/07/2015 11:46:34

## 2015-03-11 ENCOUNTER — Ambulatory Visit: Payer: Medicaid Other | Admitting: Surgery

## 2015-03-12 ENCOUNTER — Encounter: Payer: Medicaid Other | Admitting: Surgery

## 2015-03-12 DIAGNOSIS — E11621 Type 2 diabetes mellitus with foot ulcer: Secondary | ICD-10-CM | POA: Diagnosis not present

## 2015-03-13 NOTE — Progress Notes (Signed)
NISHAWN, ROTAN ([id number]) Visit Report for 03/12/2015 Chief Complaint Document Details Patient Name: [name], [name]. Date of Service: 03/12/2015 10:00 AM Medical Record Number: [id number] Patient Account Number: [id number] Date of Birth/Sex: 1965-12-05 (49 y.o. Male) Treating RN: Clover Mealy, RN, BSN, [place] Sink Primary Care Physician: Other Clinician: Referring Physician: Merwyn Katos Treating Physician/Extender: Rudene Re in Treatment: 1 Information Obtained from: Patient Chief Complaint Patients presents for treatment of an open diabetic ulcer. He presents with ulcers on both feet which she's had for over a year and a half. He has been seen at the wound care center at [facility] and was a patient of Dr. Leanord Hawking for about 2 months Electronic Signature(s) Signed: 03/12/2015 9:44:14 AM By: Evlyn Kanner MD, FACS Entered By: Evlyn Kanner on 03/12/2015 09:44:13 [name] ([id number]) -------------------------------------------------------------------------------- HPI Details Patient Name: [name]. Date of Service: 03/12/2015 10:00 AM Medical Record Number: [id number] Patient Account Number: [id number] Date of Birth/Sex: [birth year] (49 y.o. Male) Treating RN: Clover Mealy, RN, BSN, [place] Sink Primary Care Physician: Other Clinician: Referring Physician: Merwyn Katos Treating Physician/Extender: Rudene Re in Treatment: 1 History of Present Illness Location: ulcers on the plantar aspect of both feet for over a year and a half Quality: Patient reports No Pain. Severity: Patient states wound are getting worse. Duration: Patient has had the wound for > 18 months prior to seeking treatment at the wound center Context: The wound appeared gradually over time Modifying Factors: Other treatment(s) tried include: his podiatrist has been treating him with Regranex and he has been seen at the [facility] wound care center and treated with surgical debridement, antibiotics and  total contact cast. Associated Signs and Symptoms: Patient reports having increase discharge. HPI Description: This is a 49 year old man who comes in for review of wounds on his bilateral plantar feet which are been present for one and a half year. he has been seen at the [facility] wound care center by Dr. Leanord Hawking and was treated there for a couple of months and I'm not entirely sure why he changed to this center. He says it was difficult for him to get an appointment due to shortage of staff. The last time around he had a total contact cast which he cut himself because he was unable to go back to [place]. He has severe diabetic neuropathy with lower extremity pain. He has had a wound over his left plantar first metatarsal head and a wound on the right foot at roughly the second and third plantar metatarsal heads. He has a Darco sandal on the left and a healing sandal on the right. For roughly the last 8 months. He has been using Regranex and this was been followed by his podiatrist at [place] [facility] He has had multiple x-rays of his feet according to the patient that have not shown infection. He has not had an MRI. He was treated with doxycycline and Septra in the past for a staph aureus infection. He has never been a smoker. he goes to a pain clinic for pain medications and is regularly seen for his primary care needs by his PCP. Electronic Signature(s) Signed: 03/12/2015 9:44:18 AM By: Evlyn Kanner MD, FACS Entered By: Evlyn Kanner on 03/12/2015 09:44:17 [name] ([id number]) -------------------------------------------------------------------------------- Physical Exam Details Patient Name: [name]. Date of Service: 03/12/2015 10:00 AM Medical Record Number: [id number] Patient Account Number: [id number] Date of Birth/Sex: [birth year] (49 y.o. Male) Treating RN: Clover Mealy, RN, BSN, [facility] Primary Care Physician: Other Clinician: Referring  Physician: Merwyn Katos Treating Physician/Extender: Rudene Re in Treatment: 1 Constitutional . Pulse regular. Respirations normal and unlabored. Afebrile. . Eyes Nonicteric. Reactive to light. Ears, Nose, Mouth, and Throat Lips, teeth, and gums WNL.Marland Kitchen Moist mucosa without lesions . Neck supple and nontender. No palpable supraclavicular or cervical adenopathy. Normal sized without goiter. Respiratory WNL. No retractions.. Cardiovascular Pedal Pulses WNL. No clubbing, cyanosis or edema. Lymphatic No adneopathy. No adenopathy. No adenopathy. Musculoskeletal Adexa without tenderness or enlargement.. Digits and nails w/o clubbing, cyanosis, infection, petechiae, ischemia, or inflammatory conditions.. Integumentary (Hair, Skin) No suspicious lesions. No crepitus or fluctuance. No peri-wound warmth or erythema. No masses.Marland Kitchen Psychiatric Judgement and insight Intact.. No evidence of depression, anxiety, or agitation.. Notes having extensively removed callus last week this week is looking pretty good and the depths of the wound have not changed much. Electronic Signature(s) Signed: 03/12/2015 9:44:48 AM By: Evlyn Kanner MD, FACS Entered By: Evlyn Kanner on 03/12/2015 09:44:47 [name] ([id number]) -------------------------------------------------------------------------------- Physician Orders Details Patient Name: [name]. Date of Service: 03/12/2015 10:00 AM Medical Record Number: [id number] Patient Account Number: [id number] Date of Birth/Sex: Mar 22, 1966 (49 y.o. Male) Treating RN: Clover Mealy, RN, BSN, [place] Sink Primary Care Physician: Other Clinician: Referring Physician: Merwyn Katos Treating Physician/Extender: Rudene Re in Treatment: 1 Verbal / Phone Orders: Yes Clinician: Afful, RN, BSN, Rita Read Back and Verified: Yes Diagnosis Coding Wound Cleansing Wound #1 Right,Plantar Foot o Clean wound with Normal Saline. Wound #2  Right,Lateral,Plantar Metatarsal head fifth o Clean wound with Normal Saline. Wound #3 Left,Plantar Metatarsal head first o Clean wound with Normal Saline. Anesthetic Wound #1 Right,Plantar Foot o Topical Lidocaine 4% cream applied to wound bed prior to debridement Wound #2 Right,Lateral,Plantar Metatarsal head fifth o Topical Lidocaine 4% cream applied to wound bed prior to debridement Wound #3 Left,Plantar Metatarsal head first o Topical Lidocaine 4% cream applied to wound bed prior to debridement Primary Wound Dressing Wound #1 Right,Plantar Foot o Other: - sorbact Wound #2 Right,Lateral,Plantar Metatarsal head fifth o Other: - sorbact Wound #3 Left,Plantar Metatarsal head first o Other: - sorbact Secondary Dressing Wound #1 Right,Plantar Foot o Drawtex Wound #3 Left,Plantar Metatarsal head first o MOUA, RASMUSSON ([id number]) Dressing Change Frequency Wound #1 Right,Plantar Foot o Change dressing every other day. Wound #3 Left,Plantar Metatarsal head first o Change dressing every other day. Follow-up Appointments Wound #1 Right,Plantar Foot o Return Appointment in 1 week. Wound #2 Right,Lateral,Plantar Metatarsal head fifth o Return Appointment in 1 week. Off-Loading Wound #3 Left,Plantar Metatarsal head first o Open toe surgical shoe to: - front off-loader to left o Open toe surgical shoe with peg assist. - right Wound #1 Right,Plantar Foot o Total Contact Cast to Right Lower Extremity Additional Orders / Instructions Wound #1 Right,Plantar Foot o Increase protein intake. Wound #2 Right,Lateral,Plantar Metatarsal head fifth o Increase protein intake. Wound #3 Left,Plantar Metatarsal head first o Increase protein intake. Electronic Signature(s) Signed: 03/12/2015 9:40:52 AM By: Elpidio Eric BSN, RN Signed: 03/12/2015 4:47:37 PM By: Evlyn Kanner MD, FACS Entered By: Elpidio Eric on 03/12/2015 09:40:51 DONALDSON, RICHTER ([id number]) -------------------------------------------------------------------------------- Problem List Details Patient Name: STEFEN, JUBA. Date of Service: 03/12/2015 10:00 AM Medical Record Number: [id number] Patient Account Number: [id number] Date of Birth/Sex: 11/05/1965 (49 y.o. 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 Date of Service: 03/12/2015 10:00 AM Medical Record Number: [id number] Patient Account Number: [id number] Date of Birth/Sex: [birth year] (49 y.o. Male) Treating RN: Clover Mealy, RN, BSN, [place] Sink Primary Care Physician: Other Clinician: Referring Physician: Merwyn Katos Treating Physician/Extender: Rudene Re in Treatment: 1 Subjective Chief Complaint Information obtained from Patient Patients presents for treatment of an open diabetic ulcer. He presents with ulcers on both feet which she's had for over a year and a half. He has been seen at the wound care center at [facility] and was a patient of Dr. Leanord Hawking for about 2 months History of  Present Illness (HPI) The following HPI elements were documented for the patient's wound: Location: ulcers on the plantar aspect of both feet for over a year and a half Quality: Patient reports No Pain. Severity: Patient states wound are getting worse. Duration: Patient has had the wound for > 18 months prior to seeking treatment at the wound center Context: The wound appeared gradually over time Modifying Factors: Other treatment(s) tried include: his podiatrist has been treating him with Regranex and he has been seen at the [facility] wound care center and treated with surgical debridement, antibiotics and total contact cast. Associated Signs and Symptoms: Patient reports having increase discharge. This is a 49 year old man who comes in for review of wounds on his bilateral plantar feet which are been present for one and a half year. he has been seen at the [facility] wound care center by Dr. Leanord Hawking and was treated there for a couple of months and I'm not entirely sure why he changed to this center. He says it was difficult for him to get an appointment due to shortage of staff. The last time around he had a total contact cast which he cut himself because he was unable to go back to [place]. He has severe diabetic neuropathy with lower extremity pain. He has had a wound over his left plantar first metatarsal head and a wound on the right foot at roughly the second and third plantar metatarsal heads. He has a Darco sandal on the left and a healing sandal on the right. For roughly the last 8 months. He has been using Regranex and this was been followed by his podiatrist at [place] [facility] He has had multiple x-rays of his feet according to the patient that have not shown infection. He has not had an MRI. He was treated with doxycycline and Septra in the past for a staph aureus infection. He has never been a smoker. he goes to a pain clinic for pain medications and  is regularly seen for his primary care needs by his PCP. DAMONEY, JULIA ([id number]) Objective Constitutional Pulse regular. Respirations normal and unlabored. Afebrile. Vitals Time Taken: 9:20 AM, Height: 72 in, Weight: 228 lbs, BMI: 30.9, Temperature: 98 F, Pulse: 85 bpm, Respiratory Rate: 20 breaths/min, Blood Pressure: 134/67 mmHg. Eyes Nonicteric. Reactive to light. Ears, Nose, Mouth, and Throat Lips, teeth, and gums WNL.Marland Kitchen Moist mucosa without  lesions . Neck supple and nontender. No palpable supraclavicular or cervical adenopathy. Normal sized without goiter. Respiratory WNL. No retractions.. Cardiovascular Pedal Pulses WNL. No clubbing, cyanosis or edema. Lymphatic No adneopathy. No adenopathy. No adenopathy. Musculoskeletal Adexa without tenderness or enlargement.. Digits and nails w/o clubbing, cyanosis, infection, petechiae, ischemia, or inflammatory conditions.Marland Kitchen Psychiatric Judgement and insight Intact.. No evidence of depression, anxiety, or agitation.. General Notes: having extensively removed callus last week this week is looking pretty good and the depths of the wound have not changed much. Integumentary (Hair, Skin) No suspicious lesions. No crepitus or fluctuance. No peri-wound warmth or erythema. No masses.. Wound #1 status is Open. Original cause of wound was Pressure Injury. The wound is located on the Right,Plantar Foot. The wound measures 1.5cm length x 1cm width x 0.4cm depth; 1.178cm^2 area and 0.471cm^3 volume. The wound is limited to skin breakdown. There is no tunneling or undermining noted. There is a medium amount of serosanguineous drainage noted. The wound margin is thickened. There is large (67-100%) granulation within the wound bed. There is a small (1-33%) amount of necrotic tissue within the wound bed including Adherent Slough. The periwound skin appearance exhibited: Callus, Moist. KONRAD, DESAI. ([id number]) The periwound skin appearance  did not exhibit: Crepitus, Excoriation, Fluctuance, Friable, Induration, Localized Edema, Rash, Scarring, Dry/Scaly, Maceration, Atrophie Blanche, Cyanosis, Ecchymosis, Hemosiderin Staining, Mottled, Pallor, Rubor, Erythema. Periwound temperature was noted as No Abnormality. The periwound has tenderness on palpation. Wound #2 status is Open. Original cause of wound was Pressure Injury. The wound is located on the Right,Lateral,Plantar Metatarsal head fifth. The wound measures 0.1cm length x 0.1cm width x 0.1cm depth; 0.008cm^2 area and 0.001cm^3 volume. The wound is limited to skin breakdown. There is no tunneling or undermining noted. There is a none present amount of drainage noted. The wound margin is distinct with the outline attached to the wound base. There is no granulation within the wound bed. There is no necrotic tissue within the wound bed. The periwound skin appearance exhibited: Callus, Dry/Scaly. The periwound skin appearance did not exhibit: Crepitus, Excoriation, Fluctuance, Friable, Induration, Localized Edema, Rash, Scarring, Maceration, Moist, Atrophie Blanche, Cyanosis, Ecchymosis, Hemosiderin Staining, Mottled, Pallor, Rubor, Erythema. Periwound temperature was noted as No Abnormality. Wound #3 status is Open. Original cause of wound was Pressure Injury. The wound is located on the Left,Plantar Metatarsal head first. The wound measures 1.4cm length x 1.3cm width x 0.4cm depth; 1.429cm^2 area and 0.572cm^3 volume. The wound is limited to skin breakdown. There is no tunneling or undermining noted. There is a medium amount of serosanguineous drainage noted. The wound margin is distinct with the outline attached to the wound base. There is medium (34-66%) granulation within the wound bed. There is a small (1-33%) amount of necrotic tissue within the wound bed including Adherent Slough. The periwound skin appearance exhibited: Callus, Dry/Scaly. The periwound skin appearance  did not exhibit: Crepitus, Excoriation, Fluctuance, Friable, Induration, Localized Edema, Rash, Scarring, Maceration, Moist, Atrophie Blanche, Cyanosis, Ecchymosis, Hemosiderin Staining, Mottled, Pallor, Rubor, Erythema. Periwound temperature was noted as No Abnormality. 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 BECKEM, ANCAR ([id number]) Wound #2 Right,Lateral,Plantar  Metatarsal head fifth: Clean wound with Normal Saline. Wound #3 Left,Plantar Metatarsal head first: Clean wound with Normal Saline. Anesthetic: Wound #1 Right,Plantar Foot: Topical Lidocaine 4% cream applied to wound bed prior to debridement Wound #2 Right,Lateral,Plantar Metatarsal head fifth: Topical Lidocaine 4% cream applied to wound bed prior to debridement Wound #3 Left,Plantar Metatarsal head first: Topical Lidocaine 4% cream applied to wound bed prior to debridement Primary Wound Dressing: Wound #1 Right,Plantar Foot: Other: - sorbact Wound #2 Right,Lateral,Plantar Metatarsal head fifth: Other: - sorbact Wound #3 Left,Plantar Metatarsal head first: Other: - sorbact Secondary Dressing: Wound #1 Right,Plantar Foot: Drawtex Wound #3 Left,Plantar Metatarsal head first: Drawtex Dressing Change Frequency: Wound #1 Right,Plantar Foot: Change dressing every other day. Wound #3 Left,Plantar Metatarsal head first: Change dressing every other day. Follow-up Appointments: Wound #1 Right,Plantar Foot: Return Appointment in 1 week. Wound #2 Right,Lateral,Plantar Metatarsal head fifth: Return Appointment in 1  week. Off-Loading: Wound #3 Left,Plantar Metatarsal head first: Open toe surgical shoe to: - front off-loader to left Open toe surgical shoe with peg assist. - right Wound #1 Right,Plantar Foot: Total Contact Cast to Right Lower Extremity Additional Orders / Instructions: Wound #1 Right,Plantar Foot: Increase protein intake. Wound #2 Right,Lateral,Plantar Metatarsal head fifth: Increase protein intake. Wound #3 Left,Plantar Metatarsal head first: Increase protein intake. JOEVANI, MATCZAK ([id number]) I have recommended that the use Sorbact to be packed into both these wounds and apply some Drawtext as he has a lot of drainage and some felt off loading. He has been using a Darco front off loading shoe on the left. He would benefit from a total contact cast but because he has a problem bilaterally we will try and work on the right foot. If he has a total contact cast he has been instructed never to cut it off himself, as he has done the last time around. He says he will be compliant. 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 Electronic Signature(s) Signed: 03/12/2015 9:46:09 AM By: Evlyn Kanner MD, FACS Entered By: Evlyn Kanner on 03/12/2015 09:46:09 [name] ([id number]) -------------------------------------------------------------------------------- SuperBill Details Patient Name: [name]. Date of Service: 03/12/2015 Medical Record Number: [id number] Patient Account Number: [id number] Date of Birth/Sex: 01/11/1966 (49 y.o. Male) Treating RN: Clover Mealy, RN, BSN, Rita Primary Care Physician: Other Clinician: Referring Physician: Merwyn Katos Treating Physician/Extender: Rudene Re in Treatment: 1 Diagnosis Coding ICD-10 Codes Code Description [phone] Type 2 diabetes mellitus with foot ulcer E11.42 Type 2 diabetes mellitus with diabetic polyneuropathy L97.512 Non-pressure chronic ulcer of other part  of right foot with fat layer exposed L97.522 Non-pressure chronic ulcer of other part of left foot with fat layer exposed L84 Corns and callosities Facility Procedures CPT4 Code Description: [id number] - APPLY TOTAL CONTACT LEG CAST ICD-10 Description Diagnosis E11.621 Type 2 diabetes mellitus with foot ulcer E11.42 Type 2 diabetes mellitus with diabetic polyneuropathy L97.512 Non-pressure chronic ulcer of other  part of right foo Modifier: t with fat la Quantity: 1 yer exposed Physician Procedures CPT4 Code Description: [id number] - WC PHYS APPLY TOTAL CONTACT CAST ICD-10 Description Diagnosis E11.621 Type 2 diabetes mellitus with foot ulcer E11.42 Type 2 diabetes mellitus with diabetic polyneuropathy L97.512 Non-pressure chronic ulcer of other  part of right foot Modifier: with fat lay Quantity: 1 er exposed Electronic Signature(s) Signed: 03/12/2015 9:46:54 AM By: Evlyn Kanner MD, FACS Entered By: Evlyn Kanner on 03/12/2015 09:46:54

## 2015-03-13 NOTE — Progress Notes (Signed)
William, [name] ([id number]) Visit Report for 03/12/2015 Arrival Information Details Patient Name: [name], [name]. Date of Service: 03/12/2015 10:00 AM Medical Record Number: [id number] Patient Account Number: [id number] Date of Birth/Sex: 1966/03/31 (49 y.o. Male) Treating RN: Clover Mealy, RN, BSN, [place] Sink Primary Care Physician: Other Clinician: Referring Physician: Merwyn Katos Treating Physician/Extender: Rudene Re in Treatment: 1 Visit Information History Since Last Visit Any new allergies or adverse reactions: No Patient Arrived: Gilmer Mor Had a fall or experienced change in No Arrival Time: 09:17 activities of daily living that may affect Accompanied By: self risk of falls: Transfer Assistance: None Signs or symptoms of abuse/neglect since last No Patient Identification Verified: Yes visito Secondary Verification Process Yes Hospitalized since last visit: No Completed: Has Dressing in Place as Prescribed: Yes Patient Has Alerts: Yes Pain Present Now: No Patient Alerts: Type II Diabetic Electronic Signature(s) Signed: 03/12/2015 9:18:20 AM By: Elpidio Eric BSN, RN Entered By: Elpidio Eric on 03/12/2015 09:18:20 [name] ([id number]) -------------------------------------------------------------------------------- Encounter Discharge Information Details Patient Name: [name]. Date of Service: 03/12/2015 10:00 AM Medical Record Number: [id number] Patient Account Number: [id number] Date of Birth/Sex: [birth year] (49 y.o. Male) Treating RN: Clover Mealy, RN, BSN, [place] Sink Primary Care Physician: Other Clinician: Referring Physician: Merwyn Katos Treating Physician/Extender: Rudene Re in Treatment: 1 Encounter Discharge Information Items Discharge Pain Level: 0 Discharge Condition: Stable Ambulatory Status: Cane Discharge Destination: Home Transportation: Private Auto Accompanied By: self Schedule Follow-up Appointment: No Medication Reconciliation  completed and provided to Patient/Care No Edelyn Heidel: Provided on Clinical Summary of Care: 03/12/2015 Form Type Recipient Paper Patient [facility] Electronic Signature(s) Signed: 03/12/2015 10:43:11 AM By: Elpidio Eric BSN, RN Previous Signature: 03/12/2015 10:33:58 AM Version By: Gwenlyn Perking Entered By: Elpidio Eric on 03/12/2015 10:43:11 Shedden, [name] ([id number]) -------------------------------------------------------------------------------- Lower Extremity Assessment Details Patient Name: [name]. Date of Service: 03/12/2015 10:00 AM Medical Record Number: [id number] Patient Account Number: [id number] Date of Birth/Sex: Mar 11, 1966 (49 y.o. Male) Treating RN: Clover Mealy, RN, BSN, [place] Sink Primary Care Physician: Other Clinician: Referring Physician: Merwyn Katos Treating Physician/Extender: Rudene Re in Treatment: 1 Vascular Assessment Pulses: Posterior Tibial Dorsalis Pedis Palpable: [Left:Yes] [Right:Yes] Extremity colors, hair growth, and conditions: Extremity Color: [Left:Normal] [Right:Normal] Hair Growth on Extremity: [Left:Yes] [Right:Yes] Temperature of Extremity: [Left:Warm] [Right:Warm] Capillary Refill: [Left:< 3 seconds] [Right:< 3 seconds] Toe Nail Assessment Left: Right: Thick: No No Discolored: No No Deformed: No No Improper Length and Hygiene: No No Electronic Signature(s) Signed: 03/12/2015 9:25:53 AM By: Elpidio Eric BSN, RN Entered By: Elpidio Eric on 03/12/2015 09:25:53 Nyquist, [name] ([id number]) -------------------------------------------------------------------------------- Multi Wound Chart Details Patient Name: William Amy D. Date of Service: 03/12/2015 10:00 AM Medical Record Number: [id number] Patient Account Number: [id number] Date of Birth/Sex: 05/11/1965 (49 y.o. Male) Treating RN: Clover Mealy, RN, BSN, [place] Sink Primary Care Physician: Other Clinician: Referring Physician: Merwyn Katos Treating Physician/Extender: Rudene Re in  Treatment: 1 Vital Signs Height(in): 72 Pulse(bpm): 85 Weight(lbs): 228 Blood Pressure 134/67 (mmHg): Body Mass Index(BMI): 31 Temperature(F): 98 Respiratory Rate 20 (breaths/min): Photos: [1:No Photos] [2:No Photos] [3:No Photos] Wound Location: [1:Right Foot - Plantar Right Metatarsal head fifth Left Metatarsal head first -] [2:- Plantar, Lateral] [3:Plantar] Wounding Event: [1:Pressure Injury] [2:Pressure Injury] [3:Pressure Injury] 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] [2:the Lower Extremity] [3:the Lower Extremity] 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 Ulcer] [2:Pressure Ulcer] [3:Pressure Ulcer] Comorbid History: [1:Chronic sinus problems/congestion, Anemia, Asthma, Hypertension, Myocardial Hypertension, Myocardial Hypertension, Myocardial Infarction, Type II Diabetes, Gout, Rheumatoid Arthritis, Osteoarthritis, Neuropathy, Confinement  Neuropathy, Confinement Neuropathy, Confinement Anxiety] [2:Chronic sinus problems/congestion, Anemia, Asthma, Infarction, Type II Diabetes, Gout, Rheumatoid Arthritis, Osteoarthritis, Anxiety] [3:Chronic sinus problems/congestion, Anemia, Asthma,  Infarction, Type II Diabetes, Gout, Rheumatoid Arthritis, Osteoarthritis, Anxiety] Date Acquired: [1:02/18/2014] [2:02/18/2014] [3:02/18/2014] Weeks of Treatment: [1:1] [2:1] [3:1] Wound Status: [1:Open] [2:Open] [3:Open] Measurements L x W x D 1.5x1x0.4 [2:0.1x0.1x0.1] [3:1.4x1.3x0.4] (cm) Area (cm) : [1:1.178] [2:0.008] [3:1.429] Volume (cm) : [1:0.471] [2:0.001] [3:0.572] % Reduction in Area: [1:-19.00%] [2:95.20%] [3:-65.40%] % Reduction in Volume: 32.00% [2:93.80%] [3:-65.30%] Classification: [1:Grade 2] [2:Grade 1] [3:Grade 2] Exudate Amount: [1:Medium] [2:None Present] [3:Medium] Exudate Type: [1:Serosanguineous] [2:N/A] [3:Serosanguineous] Exudate Color: [1:red, brown] [2:N/A] [3:red, brown] Wound Margin: Thickened  Distinct, outline attached Distinct, outline attached Granulation Amount: Large (67-100%) None Present (0%) Medium (34-66%) Necrotic Amount: Small (1-33%) None Present (0%) Small (1-33%) Exposed Structures: Fascia: No Fascia: No Fascia: No Fat: No Fat: No Fat: No Tendon: No Tendon: No Tendon: No Muscle: No Muscle: No Muscle: No Joint: No Joint: No Joint: No Bone: No Bone: No Bone: No Limited to Skin Limited to Skin Limited to Skin Breakdown Breakdown Breakdown Epithelialization: None None None Periwound Skin Texture: Callus: Yes Callus: Yes Callus: Yes Edema: No Edema: No Edema: No Excoriation: No Excoriation: No Excoriation: No Induration: No Induration: No Induration: No Crepitus: No Crepitus: No Crepitus: No Fluctuance: No Fluctuance: No Fluctuance: No Friable: No Friable: No Friable: No Rash: No Rash: No Rash: No Scarring: No Scarring: No Scarring: No Periwound Skin Moist: Yes Dry/Scaly: Yes Dry/Scaly: Yes Moisture: Maceration: No Maceration: No Maceration: No Dry/Scaly: No Moist: No Moist: No Periwound Skin Color: Atrophie Blanche: No Atrophie Blanche: No Atrophie Blanche: No Cyanosis: No Cyanosis: No Cyanosis: No Ecchymosis: No Ecchymosis: No Ecchymosis: No Erythema: No Erythema: No Erythema: No Hemosiderin Staining: No Hemosiderin Staining: No Hemosiderin Staining: No Mottled: No Mottled: No Mottled: No Pallor: No Pallor: No Pallor: No Rubor: No Rubor: No Rubor: No Temperature: No Abnormality No Abnormality No Abnormality Tenderness on Yes No No Palpation: Wound Preparation: Ulcer Cleansing: Ulcer Cleansing: Ulcer Cleansing: Rinsed/Irrigated with Rinsed/Irrigated with Rinsed/Irrigated with Saline Saline Saline Topical Anesthetic Topical Anesthetic Topical Anesthetic Applied: Other: lidocaine Applied: Other: lidocaine Applied: Other: Lidocaine 4% 4% 4% Treatment Notes Electronic Signature(s) Signed: 03/12/2015  9:39:33 AM By: Elpidio Eric BSN, RN Entered By: Elpidio Eric on 03/12/2015 09:39:32 CHERYL, STABENOW ([id number]) LINDWOOD, MOGEL ([id number]) -------------------------------------------------------------------------------- Multi-Disciplinary Care Plan Details Patient Name: HUZAIFA, VINEY. Date of Service: 03/12/2015 10:00 AM Medical Record Number: [id number] Patient Account Number: [id number] Date of Birth/Sex: 06/18/1965 (49 y.o. Male) Treating RN: Clover Mealy, RN, BSN, [place] Sink Primary Care Physician: Other Clinician: Referring Physician: Merwyn Katos Treating Physician/Extender: Rudene Re in Treatment: 1 Active Inactive Abuse / Safety / Falls / Self Care Management Nursing Diagnoses: Potential for falls Goals: Patient will remain injury free Date Initiated: 02/28/2015 Goal Status: Active Interventions: Assess fall risk on admission and as needed Notes: Nutrition Nursing Diagnoses: Impaired glucose control: actual or potential Goals: Patient/caregiver agrees to and verbalizes understanding of need to obtain nutritional consultation Date Initiated: 02/28/2015 Goal Status: Active Interventions: Assess patient nutrition upon admission and as needed per policy Notes: Orientation to the Wound Care Program Nursing Diagnoses: Knowledge deficit related to the wound healing center program Goals: Patient/caregiver will verbalize understanding of the Wound Healing Center Program Date Initiated: 02/28/2015 JANIE, STROTHMAN ([id number]) Goal Status: Active Interventions: Provide education on orientation to the wound center Notes: Pressure Nursing Diagnoses: Potential for impaired tissue integrity related to pressure, friction, moisture, and shear  Goals: Patient will remain free of pressure ulcers Date Initiated: 02/28/2015 Goal Status: Active Interventions: Assess: immobility, friction, shearing, incontinence upon admission and as needed Notes: Wound/Skin  Impairment Nursing Diagnoses: Impaired tissue integrity Goals: Ulcer/skin breakdown will heal within 14 weeks Date Initiated: 02/28/2015 Goal Status: Active Interventions: Assess patient/caregiver ability to obtain necessary supplies Notes: Electronic Signature(s) Signed: 03/12/2015 9:39:19 AM By: Elpidio Eric BSN, RN Entered By: Elpidio Eric on 03/12/2015 09:39:19 [name] ([id number]) -------------------------------------------------------------------------------- Pain Assessment Details Patient Name: [name]. Date of Service: 03/12/2015 10:00 AM Medical Record Number: [id number] Patient Account Number: [id number] Date of Birth/Sex: Feb 11, 1966 (49 y.o. Male) Treating RN: Clover Mealy, RN, BSN, [place] Sink Primary Care Physician: Other Clinician: Referring Physician: Merwyn Katos Treating Physician/Extender: Rudene Re in Treatment: 1 Active Problems Location of Pain Severity and Description of Pain Patient Has Paino No Site Locations Pain Management and Medication Current Pain Management: Electronic Signature(s) Signed: 03/12/2015 9:18:27 AM By: Elpidio Eric BSN, RN Entered By: Elpidio Eric on 03/12/2015 09:18:27 [name] ([id number]) -------------------------------------------------------------------------------- Patient/Caregiver Education Details Patient Name: [name]. Date of Service: 03/12/2015 10:00 AM Medical Record Number: [id number] Patient Account Number: [id number] Date of Birth/Gender: 1965/11/05 (49 y.o. Male) Treating RN: Clover Mealy, RN, BSN, [place] Sink Primary Care Physician: Other Clinician: Referring Physician: Merwyn Katos Treating Physician/Extender: Rudene Re in Treatment: 1 Education Assessment Education Provided To: Patient Education Topics Provided Basic Hygiene: Methods: Explain/Verbal Responses: State content correctly Welcome To The Wound Care Center: Methods: Explain/Verbal Responses: Reinforcements  needed Electronic Signature(s) Signed: 03/12/2015 10:43:37 AM By: Elpidio Eric BSN, RN Entered By: Elpidio Eric on 03/12/2015 10:43:37 [name] ([id number]) -------------------------------------------------------------------------------- Wound Assessment Details Patient Name: [name]. Date of Service: 03/12/2015 10:00 AM Medical Record Number: [id number] Patient Account Number: [id number] Date of Birth/Sex: 1965-05-19 (49 y.o. Male) Treating RN: Clover Mealy, RN, BSN, [place] Primary Care Physician: Other Clinician: Referring Physician: Merwyn Katos Treating Physician/Extender: Rudene Re in Treatment: 1 Wound Status Wound Number: 1 Primary Diabetic Wound/Ulcer of the Lower Etiology: Extremity Wound Location: Right Foot - Plantar Secondary Pressure Ulcer Wounding Event: Pressure Injury Etiology: Date Acquired: 02/18/2014 Wound Open Weeks Of Treatment: 1 Status: Clustered Wound: No Comorbid Chronic sinus problems/congestion, History: Anemia, Asthma, Hypertension, Myocardial Infarction, Type II Diabetes, Gout, Rheumatoid Arthritis, Osteoarthritis, Neuropathy, Confinement Anxiety Photos Photo Uploaded By: Elpidio Eric on 03/12/2015 14:09:48 Wound Measurements Length: (cm) 1.5 Width: (cm) 1 Depth: (cm) 0.4 Area: (cm) 1.178 Volume: (cm) 0.471 % Reduction in Area: -19% % Reduction in Volume: 32% Epithelialization: None Tunneling: No Undermining: No Wound Description Classification: Grade 2 Foul Odor Aft Wound Margin: Thickened Exudate Amount: Medium Exudate Type: Serosanguineous Exudate Color: red, brown SAAVAN, KERKSTRA ([id number]) er Cleansing: No Wound Bed Granulation Amount: Large (67-100%) Exposed Structure Necrotic Amount: Small (1-33%) Fascia Exposed: No Necrotic Quality: Adherent Slough Fat Layer Exposed: No Tendon Exposed: No Muscle Exposed: No Joint Exposed: No Bone Exposed: No Limited to Skin Breakdown Periwound Skin  Texture Texture Color No Abnormalities Noted: No No Abnormalities Noted: No Callus: Yes Atrophie Blanche: No Crepitus: No Cyanosis: No Excoriation: No Ecchymosis: No Fluctuance: No Erythema: No Friable: No Hemosiderin Staining: No Induration: No Mottled: No Localized Edema: No Pallor: No Rash: No Rubor: No Scarring: No Temperature / Pain Moisture Temperature: No Abnormality No Abnormalities Noted: No Tenderness on Palpation: Yes Dry / Scaly: No Maceration: No Moist: Yes Wound Preparation Ulcer Cleansing: Rinsed/Irrigated with Saline Topical Anesthetic Applied: Other: lidocaine 4%, Treatment Notes Wound #1 (Right, Plantar Foot) 1. Cleansed with:  Clean wound with Normal Saline 2. Anesthetic Topical Lidocaine 4% cream to wound bed prior to debridement 4. Dressing Applied: Other dressing (specify in notes) 6. Footwear/Offloading device applied Surgical shoe Wedge shoe Other footwear/offloading device applied (specify in notes) Notes LEOVANNI, BJORKMAN ([id number]) Sorbact; drawtex; gauze TCC applied to right lower leg Electronic Signature(s) Signed: 03/12/2015 9:29:04 AM By: Elpidio Eric BSN, RN Entered By: Elpidio Eric on 03/12/2015 09:29:03 [name] ([id number]) -------------------------------------------------------------------------------- Wound Assessment Details Patient Name: [name]. Date of Service: 03/12/2015 10:00 AM Medical Record Number: [id number] Patient Account Number: [id number] Date of Birth/Sex: Oct 13, 1965 (49 y.o. Male) Treating RN: Clover Mealy, RN, BSN, [place] Primary Care Physician: Other Clinician: Referring Physician: Merwyn Katos Treating Physician/Extender: Rudene Re in Treatment: 1 Wound Status Wound Number: 2 Primary Diabetic Wound/Ulcer of the Lower Etiology: Extremity Wound Location: Right Metatarsal head fifth - Plantar, Lateral Secondary Pressure Ulcer Etiology: Wounding Event: Pressure Injury Wound  Open Date Acquired: 02/18/2014 Status: Weeks Of Treatment: 1 Comorbid Chronic sinus problems/congestion, Clustered Wound: No History: Anemia, Asthma, Hypertension, Myocardial Infarction, Type II Diabetes, Gout, Rheumatoid Arthritis, Osteoarthritis, Neuropathy, Confinement Anxiety Photos Photo Uploaded By: Elpidio Eric on 03/12/2015 14:10:14 Wound Measurements Length: (cm) 0.1 Width: (cm) 0.1 Depth: (cm) 0.1 Area: (cm) 0.008 Volume: (cm) 0.001 % Reduction in Area: 95.2% % Reduction in Volume: 93.8% Epithelialization: None Tunneling: No Undermining: No Wound Description Classification: Grade 1 Wound Margin: Distinct, outline attached Exudate Amount: None Present Foul Odor After Cleansing: No Wound Bed Granulation Amount: None Present (0%) Exposed Structure SEANN, GENTHER ([id number]) Necrotic Amount: None Present (0%) Fascia Exposed: No Fat Layer Exposed: No Tendon Exposed: No Muscle Exposed: No Joint Exposed: No Bone Exposed: No Limited to Skin Breakdown Periwound Skin Texture Texture Color No Abnormalities Noted: No No Abnormalities Noted: No Callus: Yes Atrophie Blanche: No Crepitus: No Cyanosis: No Excoriation: No Ecchymosis: No Fluctuance: No Erythema: No Friable: No Hemosiderin Staining: No Induration: No Mottled: No Localized Edema: No Pallor: No Rash: No Rubor: No Scarring: No Temperature / Pain Moisture Temperature: No Abnormality No Abnormalities Noted: No Dry / Scaly: Yes Maceration: No Moist: No Wound Preparation Ulcer Cleansing: Rinsed/Irrigated with Saline Topical Anesthetic Applied: Other: lidocaine 4%, Treatment Notes Wound #2 (Right, Lateral, Plantar Metatarsal head fifth) 1. Cleansed with: Clean wound with Normal Saline 2. Anesthetic Topical Lidocaine 4% cream to wound bed prior to debridement 4. Dressing Applied: Other dressing (specify in notes) 6. Footwear/Offloading device applied Surgical shoe Wedge shoe Other  footwear/offloading device applied (specify in notes) Notes Sorbact; drawtex; gauze TCC applied to right lower leg LEWELLYN, FULTZ ([id number]) Electronic Signature(s) Signed: 03/12/2015 9:30:06 AM By: Elpidio Eric BSN, RN Entered By: Elpidio Eric on 03/12/2015 09:30:06 [name] ([id number]) -------------------------------------------------------------------------------- Wound Assessment Details Patient Name: [name]. Date of Service: 03/12/2015 10:00 AM Medical Record Number: [id number] Patient Account Number: [id number] Date of Birth/Sex: April 16, 1966 (49 y.o. Male) Treating RN: Clover Mealy, RN, BSN, [place] Sink Primary Care Physician: Other Clinician: Referring Physician: Merwyn Katos Treating Physician/Extender: Rudene Re in Treatment: 1 Wound Status Wound Number: 3 Primary Diabetic Wound/Ulcer of the Lower Etiology: Extremity Wound Location: Left Metatarsal head first - Plantar Secondary Pressure Ulcer Etiology: Wounding Event: Pressure Injury Wound Open Date Acquired: 02/18/2014 Status: Weeks Of Treatment: 1 Comorbid Chronic sinus problems/congestion, Clustered Wound: No History: Anemia, Asthma, Hypertension, Myocardial Infarction, Type II Diabetes, Gout, Rheumatoid Arthritis, Osteoarthritis, Neuropathy, Confinement Anxiety Photos Photo Uploaded By: Elpidio Eric on 03/12/2015 14:10:14 Wound Measurements Length: (cm) 1.4 Width: (cm) 1.3 Depth: (  cm) 0.4 Area: (cm) 1.429 Volume: (cm) 0.572 % Reduction in Area: -65.4% % Reduction in Volume: -65.3% Epithelialization: None Tunneling: No Undermining: No Wound Description Classification: Grade 2 Wound Margin: Distinct, outline attached Exudate Amount: Medium Exudate Type: Serosanguineous Exudate Color: red, brown MASSIMO, HARTLAND ([id number]) Foul Odor After Cleansing: No Wound Bed Granulation Amount: Medium (34-66%) Exposed Structure Necrotic Amount: Small (1-33%) Fascia Exposed: No Necrotic  Quality: Adherent Slough Fat Layer Exposed: No Tendon Exposed: No Muscle Exposed: No Joint Exposed: No Bone Exposed: No Limited to Skin Breakdown Periwound Skin Texture Texture Color No Abnormalities Noted: No No Abnormalities Noted: No Callus: Yes Atrophie Blanche: No Crepitus: No Cyanosis: No Excoriation: No Ecchymosis: No Fluctuance: No Erythema: No Friable: No Hemosiderin Staining: No Induration: No Mottled: No Localized Edema: No Pallor: No Rash: No Rubor: No Scarring: No Temperature / Pain Moisture Temperature: No Abnormality No Abnormalities Noted: No Dry / Scaly: Yes Maceration: No Moist: No Wound Preparation Ulcer Cleansing: Rinsed/Irrigated with Saline Topical Anesthetic Applied: Other: Lidocaine 4%, Treatment Notes Wound #3 (Left, Plantar Metatarsal head first) 1. Cleansed with: Clean wound with Normal Saline 2. Anesthetic Topical Lidocaine 4% cream to wound bed prior to debridement 4. Dressing Applied: Other dressing (specify in notes) 6. Footwear/Offloading device applied Surgical shoe Wedge shoe Other footwear/offloading device applied (specify in notes) Notes KEYONTAY, STOLZ ([id number]) Sorbact; drawtex; gauze TCC applied to right lower leg Electronic Signature(s) Signed: 03/12/2015 9:30:54 AM By: Elpidio Eric BSN, RN Entered By: Elpidio Eric on 03/12/2015 09:30:54 [name] ([id number]) -------------------------------------------------------------------------------- Vitals Details Patient Name: [name]. Date of Service: 03/12/2015 10:00 AM Medical Record Number: [id number] Patient Account Number: [id number] Date of Birth/Sex: August 01, 1965 (49 y.o. Male) Treating RN: Clover Mealy, RN, BSN, [place] Primary Care Physician: Other Clinician: Referring Physician: Merwyn Katos Treating Physician/Extender: Rudene Re in Treatment: 1 Vital Signs Time Taken: 09:20 Temperature (F): 98 Height (in): 72 Pulse (bpm): 85 Weight  (lbs): 228 Respiratory Rate (breaths/min): 20 Body Mass Index (BMI): 30.9 Blood Pressure (mmHg): 134/67 Reference Range: 80 - 120 mg / dl Electronic Signature(s) Signed: 03/12/2015 9:20:43 AM By: Elpidio Eric BSN, RN Entered By: Elpidio Eric on 03/12/2015 09:20:43

## 2015-03-19 ENCOUNTER — Ambulatory Visit: Payer: Medicaid Other | Admitting: Surgery

## 2015-03-21 ENCOUNTER — Encounter: Payer: Medicaid Other | Attending: Surgery | Admitting: Surgery

## 2015-03-21 DIAGNOSIS — M1990 Unspecified osteoarthritis, unspecified site: Secondary | ICD-10-CM | POA: Insufficient documentation

## 2015-03-21 DIAGNOSIS — D649 Anemia, unspecified: Secondary | ICD-10-CM | POA: Insufficient documentation

## 2015-03-21 DIAGNOSIS — L97512 Non-pressure chronic ulcer of other part of right foot with fat layer exposed: Secondary | ICD-10-CM | POA: Insufficient documentation

## 2015-03-21 DIAGNOSIS — I10 Essential (primary) hypertension: Secondary | ICD-10-CM | POA: Diagnosis not present

## 2015-03-21 DIAGNOSIS — M109 Gout, unspecified: Secondary | ICD-10-CM | POA: Insufficient documentation

## 2015-03-21 DIAGNOSIS — E1142 Type 2 diabetes mellitus with diabetic polyneuropathy: Secondary | ICD-10-CM | POA: Insufficient documentation

## 2015-03-21 DIAGNOSIS — L97522 Non-pressure chronic ulcer of other part of left foot with fat layer exposed: Secondary | ICD-10-CM | POA: Diagnosis not present

## 2015-03-21 DIAGNOSIS — E11621 Type 2 diabetes mellitus with foot ulcer: Secondary | ICD-10-CM | POA: Insufficient documentation

## 2015-03-21 DIAGNOSIS — Z880 Allergy status to penicillin: Secondary | ICD-10-CM | POA: Diagnosis not present

## 2015-03-21 DIAGNOSIS — E785 Hyperlipidemia, unspecified: Secondary | ICD-10-CM | POA: Insufficient documentation

## 2015-03-21 DIAGNOSIS — M069 Rheumatoid arthritis, unspecified: Secondary | ICD-10-CM | POA: Diagnosis not present

## 2015-03-21 DIAGNOSIS — Z87891 Personal history of nicotine dependence: Secondary | ICD-10-CM | POA: Diagnosis not present

## 2015-03-21 DIAGNOSIS — I252 Old myocardial infarction: Secondary | ICD-10-CM | POA: Insufficient documentation

## 2015-03-21 DIAGNOSIS — L84 Corns and callosities: Secondary | ICD-10-CM | POA: Insufficient documentation

## 2015-03-21 DIAGNOSIS — J45909 Unspecified asthma, uncomplicated: Secondary | ICD-10-CM | POA: Insufficient documentation

## 2015-03-22 NOTE — Progress Notes (Signed)
STEPHONE, GUM ([id number]) Visit Report for 03/21/2015 Arrival Information Details Patient Name: [name], [name]. Date of Service: 03/21/2015 1:00 PM Medical Record Number: [id number] Patient Account Number: [id number] Date of Birth/Sex: [birth year] (49 y.o. Male) Treating RN: Curtis Sites Primary Care Physician: Other Clinician: Referring Physician: Merwyn Katos Treating Physician/Extender: Rudene Re in Treatment: 3 Visit Information History Since Last Visit Added or deleted any medications: No Patient Arrived: [name] Any new allergies or adverse reactions: No Arrival Time: 13:04 Had a fall or experienced change in No Accompanied By: self activities of daily living that may affect Transfer Assistance: None risk of falls: Patient Identification Verified: Yes Signs or symptoms of abuse/neglect since last No Secondary Verification Process Yes visito Completed: Hospitalized since last visit: No Patient Has Alerts: Yes Pain Present Now: No Patient Alerts: Type II Diabetic Electronic Signature(s) Signed: 03/21/2015 5:52:14 PM By: Curtis Sites Entered By: Curtis Sites on 03/21/2015 13:04:36 [name] ([id number]) -------------------------------------------------------------------------------- Encounter Discharge Information Details Patient Name: [name]. Date of Service: 03/21/2015 1:00 PM Medical Record Number: [id number] Patient Account Number: [id number] Date of Birth/Sex: 12/24/1965 (49 y.o. Male) Treating RN: Huel Coventry Primary Care Physician: Other Clinician: Referring Physician: Merwyn Katos Treating Physician/Extender: Rudene Re in Treatment: 3 Encounter Discharge Information Items Discharge Pain Level: 0 Discharge Condition: Stable Ambulatory Status: Ambulatory Discharge Destination: Home Transportation: Private Auto Accompanied By: self Schedule Follow-up Appointment: Yes Medication Reconciliation completed and provided to  Patient/Care No [name]: Provided on Clinical Summary of Care: 03/21/2015 Form Type Recipient Paper Patient [facility] Electronic Signature(s) Signed: 03/21/2015 4:58:22 PM By: Curtis Sites Previous Signature: 03/21/2015 2:01:35 PM Version By: Gwenlyn Perking Entered By: Curtis Sites on 03/21/2015 16:58:22 [name] ([id number]) -------------------------------------------------------------------------------- Lower Extremity Assessment Details Patient Name: [name]. Date of Service: 03/21/2015 1:00 PM Medical Record Number: [id number] Patient Account Number: [id number] Date of Birth/Sex: 23-Aug-1965 (49 y.o. Male) Treating RN: Curtis Sites Primary Care Physician: Other Clinician: Referring Physician: Merwyn Katos Treating Physician/Extender: Rudene Re in Treatment: 3 Vascular Assessment Pulses: Posterior Tibial Dorsalis Pedis Palpable: [Left:Yes] [Right:Yes] Extremity colors, hair growth, and conditions: Extremity Color: [Left:Normal] [Right:Normal] Hair Growth on Extremity: [Left:Yes] [Right:Yes] Temperature of Extremity: [Left:Warm] [Right:Warm] Capillary Refill: [Left:< 3 seconds] [Right:< 3 seconds] Electronic Signature(s) Signed: 03/21/2015 5:52:14 PM By: Curtis Sites Entered By: Curtis Sites on 03/21/2015 13:15:08 [name] ([id number]) -------------------------------------------------------------------------------- Multi Wound Chart Details Patient Name: William Amy D. Date of Service: 03/21/2015 1:00 PM Medical Record Number: [id number] Patient Account Number: [id number] Date of Birth/Sex: 09-04-1965 (49 y.o. Male) Treating RN: Curtis Sites Primary Care Physician: Other Clinician: Referring Physician: Merwyn Katos Treating Physician/Extender: Rudene Re in Treatment: 3 Vital Signs Height(in): 72 Pulse(bpm): Weight(lbs): 228 Blood Pressure 112/62 (mmHg): Body Mass Index(BMI): 31 Temperature(F): 97.8 Respiratory  Rate 18 (breaths/min): Photos: [1:No Photos] [2:No Photos] [3:No Photos] Wound Location: [1:Right Foot - Plantar] [2:Right, Lateral, Plantar Metatarsal head fifth] [3:Left Metatarsal head first - Plantar] Wounding Event: [1:Pressure Injury] [2:Pressure Injury] [3:Pressure Injury] Primary Etiology: [1:Diabetic Wound/Ulcer of the Lower Extremity] [2:Diabetic Wound/Ulcer of the Lower Extremity] [3:Diabetic Wound/Ulcer of the Lower Extremity] 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 Ulcer] [2:Pressure Ulcer] [3:Pressure Ulcer] Comorbid History: [1:Chronic sinus problems/congestion, Anemia, Asthma, Hypertension, Myocardial Infarction, Type II Diabetes, Gout, Rheumatoid Arthritis, Osteoarthritis, Neuropathy, Confinement Anxiety] [2:N/A] [3:Chronic sinus problems/congestion,  Anemia, Asthma, Hypertension, Myocardial Infarction, Type II Diabetes, Gout, Rheumatoid Arthritis, Osteoarthritis, Neuropathy, Confinement Anxiety] Date Acquired: [1:02/18/2014] [2:02/18/2014] [3:02/18/2014] Weeks of Treatment: [1:3] [2:3] [3:3] Wound Status: [1:Open] [2:Open] [3:Open] Measurements L x W  x D 1.2x1x0.3 [2:0x0x0] [3:1.1x0.9x0.4] (cm) Area (cm) : [1:0.942] [2:0] [3:0.778] Volume (cm) : [1:0.283] [2:0] [3:0.311] % Reduction in Area: [1:4.80%] [2:100.00%] [3:10.00%] % Reduction in Volume: 59.20% [2:100.00%] [3:10.10%] Classification: [1:Grade 2] [2:Grade 1] [3:Grade 2] Exudate Amount: [1:Medium] [2:N/A] [3:Medium] Exudate Type: [1:Serosanguineous] [2:N/A] [3:Serosanguineous] Exudate Color: [1:red, brown] [2:N/A] [3:red, brown] Wound Margin: Thickened N/A Distinct, outline attached Granulation Amount: Large (67-100%) N/A Medium (34-66%) Granulation Quality: Red N/A N/A Necrotic Amount: Small (1-33%) N/A Small (1-33%) Exposed Structures: Fascia: No N/A Fascia: No Fat: No Fat: No Tendon: No Tendon: No Muscle: No Muscle: No Joint: No Joint: No Bone: No Bone: No Limited to Skin Limited to Skin Breakdown  Breakdown Epithelialization: None N/A None Periwound Skin Texture: Callus: Yes No Abnormalities Noted Callus: Yes Edema: No Edema: No Excoriation: No Excoriation: No Induration: No Induration: No Crepitus: No Crepitus: No Fluctuance: No Fluctuance: No Friable: No Friable: No Rash: No Rash: No Scarring: No Scarring: No Periwound Skin Moist: Yes No Abnormalities Noted Dry/Scaly: Yes Moisture: Maceration: No Maceration: No Dry/Scaly: No Moist: No Periwound Skin Color: Atrophie Blanche: No No Abnormalities Noted Atrophie Blanche: No Cyanosis: No Cyanosis: No Ecchymosis: No Ecchymosis: No Erythema: No Erythema: No Hemosiderin Staining: No Hemosiderin Staining: No Mottled: No Mottled: No Pallor: No Pallor: No Rubor: No Rubor: No Temperature: No Abnormality N/A No Abnormality Tenderness on Yes No No Palpation: Wound Preparation: Ulcer Cleansing: N/A Ulcer Cleansing: Rinsed/Irrigated with Rinsed/Irrigated with Saline Saline Topical Anesthetic Topical Anesthetic Applied: Other: lidocaine Applied: Other: Lidocaine 4% 4% Wound Number: 4 N/A N/A Photos: No Photos N/A N/A Wound Location: Right Foot - Dorsal N/A N/A Wounding Event: Blister N/A N/A Primary Etiology: Diabetic Wound/Ulcer of N/A N/A the Lower Extremity ORAZIO, WELLER ([id number]) Secondary Etiology: N/A N/A N/A Comorbid History: Chronic sinus N/A N/A problems/congestion, Anemia, Asthma, Hypertension, Myocardial Infarction, Type II Diabetes, Gout, Rheumatoid Arthritis, Osteoarthritis, Neuropathy, Confinement Anxiety Date Acquired: 03/18/2015 N/A N/A Weeks of Treatment: 0 N/A N/A Wound Status: Open N/A N/A Measurements L x W x D 1.4x1.2x0.1 N/A N/A (cm) Area (cm) : 1.319 N/A N/A Volume (cm) : 0.132 N/A N/A % Reduction in Area: N/A N/A N/A % Reduction in Volume: N/A N/A N/A Classification: Grade 1 N/A N/A Exudate Amount: Medium N/A N/A Exudate Type: Serous N/A N/A Exudate Color:  amber N/A N/A Wound Margin: Flat and Intact N/A N/A Granulation Amount: Large (67-100%) N/A N/A Granulation Quality: Red N/A N/A Necrotic Amount: None Present (0%) N/A N/A Exposed Structures: Fascia: No N/A N/A Fat: No Tendon: No Muscle: No Joint: No Bone: No Limited to Skin Breakdown Epithelialization: None N/A N/A Periwound Skin Texture: Edema: No N/A N/A Excoriation: No Induration: No Callus: No Crepitus: No Fluctuance: No Friable: No Rash: No Scarring: No Periwound Skin Maceration: No N/A N/A Moisture: Moist: No Dry/Scaly: No Periwound Skin Color: N/A N/A DEVEN, FURIA ([id number]) Atrophie Blanche: No Cyanosis: No Ecchymosis: No Erythema: No Hemosiderin Staining: No Mottled: No Pallor: No Rubor: No Temperature: N/A N/A N/A Tenderness on No N/A N/A Palpation: Wound Preparation: Ulcer Cleansing: N/A N/A Rinsed/Irrigated with Saline Topical Anesthetic Applied: Other: lidocaine 4% Treatment Notes Electronic Signature(s) Signed: 03/21/2015 5:52:14 PM By: Curtis Sites Entered By: Curtis Sites on 03/21/2015 13:30:17 BRYCE, CHEEVER ([id number]) -------------------------------------------------------------------------------- Multi-Disciplinary Care Plan Details Patient Name: [name]. Date of Service: 03/21/2015 1:00 PM Medical Record Number: [id number] Patient Account Number: [id number] Date of Birth/Sex: May 22, 1965 (49 y.o. Male) Treating RN: Curtis Sites Primary Care Physician: Other Clinician: Referring Physician: Merwyn Katos Treating  Physician/Extender: Britto, Errol Weeks in Treatment: 3 Active Inactive Abuse / Safety / Falls / Self Care Management Nursing Diagnoses: Potential for falls Goals: Patient will remain injury free Date Initiated: 02/28/2015 Goal Status: Active Interventions: Assess fall risk on admission and as needed Notes: Nutrition Nursing Diagnoses: Impaired glucose control: actual or  potential Goals: Patient/caregiver agrees to and verbalizes understanding of need to obtain nutritional consultation Date Initiated: 02/28/2015 Goal Status: Active Interventions: Assess patient nutrition upon admission and as needed per policy Notes: Orientation to the Wound Care Program Nursing Diagnoses: Knowledge deficit related to the wound healing center program Goals: Patient/caregiver will verbalize understanding of the Wound Healing Center Program Date Initiated: 02/28/2015 JERMANI, EBERLEIN ([id number]) Goal Status: Active Interventions: Provide education on orientation to the wound center Notes: Pressure Nursing Diagnoses: Potential for impaired tissue integrity related to pressure, friction, moisture, and shear Goals: Patient will remain free of pressure ulcers Date Initiated: 02/28/2015 Goal Status: Active Interventions: Assess: immobility, friction, shearing, incontinence upon admission and as needed Notes: Wound/Skin Impairment Nursing Diagnoses: Impaired tissue integrity Goals: Ulcer/skin breakdown will heal within 14 weeks Date Initiated: 02/28/2015 Goal Status: Active Interventions: Assess patient/caregiver ability to obtain necessary supplies Notes: Electronic Signature(s) Signed: 03/21/2015 5:52:14 PM By: Curtis Sites Entered By: Curtis Sites on 03/21/2015 13:21:00 [name] ([id number]) -------------------------------------------------------------------------------- Patient/Caregiver Education Details Patient Name: [name]. Date of Service: 03/21/2015 1:00 PM Medical Record Number: [id number] Patient Account Number: [id number] Date of Birth/Gender: 1965-06-29 (49 y.o. Male) Treating RN: Curtis Sites Primary Care Physician: Other Clinician: Referring Physician: Merwyn Katos Treating Physician/Extender: Rudene Re in Treatment: 3 Education Assessment Education Provided To: Patient Education Topics  Provided Wound/Skin Impairment: Handouts: Other: wound care as ordered Methods: Demonstration, Explain/Verbal Responses: State content correctly Electronic Signature(s) Signed: 03/21/2015 4:58:41 PM By: Curtis Sites Entered By: Curtis Sites on 03/21/2015 16:58:41 Larock, Eliott [name] ([id number]) -------------------------------------------------------------------------------- Wound Assessment Details Patient Name: [name]. Date of Service: 03/21/2015 1:00 PM Medical Record Number: [id number] Patient Account Number: [id number] Date of Birth/Sex: 1966/02/28 (49 y.o. Male) Treating RN: Curtis Sites Primary Care Physician: Other Clinician: Referring Physician: Merwyn Katos Treating Physician/Extender: Rudene Re in Treatment: 3 Wound Status Wound Number: 1 Primary Diabetic Wound/Ulcer of the Lower Etiology: Extremity Wound Location: Right Foot - Plantar Secondary Pressure Ulcer Wounding Event: Pressure Injury Etiology: Date Acquired: 02/18/2014 Wound Open Weeks Of Treatment: 3 Status: Clustered Wound: No Comorbid Chronic sinus problems/congestion, History: Anemia, Asthma, Hypertension, Myocardial Infarction, Type II Diabetes, Gout, Rheumatoid Arthritis, Osteoarthritis, Neuropathy, Confinement Anxiety Photos Photo Uploaded By: Curtis Sites on 03/21/2015 17:17:33 Wound Measurements Length: (cm) 1.2 Width: (cm) 1 Depth: (cm) 0.3 Area: (cm) 0.942 Volume: (cm) 0.283 % Reduction in Area: 4.8% % Reduction in Volume: 59.2% Epithelialization: None Tunneling: No Undermining: No Wound Description Classification: Grade 2 Foul Odor Afte Wound Margin: Thickened Exudate Amount: Medium Exudate Type: Serosanguineous Exudate Color: red, brown SHERROD, TOOTHMAN. ([id number]) r Cleansing: No Wound Bed Granulation Amount: Large (67-100%) Exposed Structure Granulation Quality: Red Fascia Exposed: No Necrotic Amount: Small (1-33%) Fat Layer Exposed:  No Necrotic Quality: Adherent Slough Tendon Exposed: No Muscle Exposed: No Joint Exposed: No Bone Exposed: No Limited to Skin Breakdown Periwound Skin Texture Texture Color No Abnormalities Noted: No No Abnormalities Noted: No Callus: Yes Atrophie Blanche: No Crepitus: No Cyanosis: No Excoriation: No Ecchymosis: No Fluctuance: No Erythema: No Friable: No Hemosiderin Staining: No Induration: No Mottled: No Localized Edema: No Pallor: No Rash: No Rubor: No Scarring: No Temperature /Rudene Reemperature:  No Abnormality No Abnormalities Noted: No Tenderness on Palpation: Yes Dry / Scaly: No Maceration: No Moist: Yes Wound Preparation Ulcer Cleansing: Rinsed/Irrigated with Saline Topical Anesthetic Applied: Other: lidocaine 4%, Treatment Notes Wound #1 (Right, Plantar Foot) 1. Cleansed with: Clean wound with Normal Saline 2. Anesthetic Topical Lidocaine 4% cream to wound bed prior to debridement 4. Dressing Applied: Other dressing (specify in notes) 5. Secondary Dressing Applied ABD and Kerlix/Conform Electronic Signature(s) Signed: 03/21/2015 5:52:14 PM By: Curtis Sites Entered By: Curtis Sites on 03/21/2015 13:18:53 JAMEER, STORIE ([id number]) MCGUIRE, GASPARYAN ([id number]) -------------------------------------------------------------------------------- Wound Assessment Details Patient Name: [name]. Date of Service: 03/21/2015 1:00 PM Medical Record Number: [id number] Patient Account Number: [id number] Date of Birth/Sex: 11/21/1965 (49 y.o. Male) Treating RN: Curtis Sites Primary Care Physician: Other Clinician: Referring Physician: Merwyn Katos Treating Physician/Extender: Rudene Re in Treatment: 3 Wound Status Wound Number: 2 Primary Etiology: Diabetic Wound/Ulcer of the Lower Extremity Wound Location: Right, Lateral, Plantar Metatarsal head fifth Secondary Pressure Ulcer Etiology: Wounding Event: Pressure  Injury Wound Status: Open Date Acquired: 02/18/2014 Weeks Of Treatment: 3 Clustered Wound: No Photos Photo Uploaded By: Curtis Sites on 03/21/2015 17:18:03 Wound Measurements Length: (cm) 0 % Reduction in Width: (cm) 0 % Reduction in Depth: (cm) 0 Area: (cm) 0 Volume: (cm) 0 Area: 100% Volume: 100% Wound Description Classification: Grade 1 Periwound Skin Texture Texture Color No Abnormalities Noted: No No Abnormalities Noted: No Moisture No Abnormalities Noted: No Electronic Signature(s) Signed: 03/21/2015 5:52:14 PM By: Elige Ko ([id number]) Entered By: Curtis Sites on 03/21/2015 13:16:28 [name] ([id number]) -------------------------------------------------------------------------------- Wound Assessment Details Patient Name: William Amy D. Date of Service: 03/21/2015 1:00 PM Medical Record Number: [id number] Patient Account Number: [id number] Date of Birth/Sex: 01/23/1966 (49 y.o. Male) Treating RN: Curtis Sites Primary Care Physician: Other Clinician: Referring Physician: Merwyn Katos Treating Physician/Extender: Rudene Re in Treatment: 3 Wound Status Wound Number: 3 Primary Diabetic Wound/Ulcer of the Lower Etiology: Extremity Wound Location: Left Metatarsal head first - Plantar Secondary Pressure Ulcer Etiology: Wounding Event: Pressure Injury Wound Open Date Acquired: 02/18/2014 Status: Weeks Of Treatment: 3 Comorbid Chronic sinus problems/congestion, Clustered Wound: No History: Anemia, Asthma, Hypertension, Myocardial Infarction, Type II Diabetes, Gout, Rheumatoid Arthritis, Osteoarthritis, Neuropathy, Confinement Anxiety Photos Photo Uploaded By: Curtis Sites on 03/21/2015 17:18:03 Wound Measurements Length: (cm) 1.1 Width: (cm) 0.9 Depth: (cm) 0.4 Area: (cm) 0.778 Volume: (cm) 0.311 % Reduction in Area: 10% % Reduction in Volume: 10.1% Epithelialization: None Tunneling:  No Undermining: No Wound Description Classification: Grade 2 Wound Margin: Distinct, outline attached Exudate Amount: Medium Exudate Type: Serosanguineous Exudate Color: red, brown HAREL, REPETTO ([id number]) Foul Odor After Cleansing: No Wound Bed Granulation Amount: Medium (34-66%) Exposed Structure Necrotic Amount: Small (1-33%) Fascia Exposed: No Necrotic Quality: Adherent Slough Fat Layer Exposed: No Tendon Exposed: No Muscle Exposed: No Joint Exposed: No Bone Exposed: No Limited to Skin Breakdown Periwound Skin Texture Texture Color No Abnormalities Noted: No No Abnormalities Noted: No Callus: Yes Atrophie Blanche: No Crepitus: No Cyanosis: No Excoriation: No Ecchymosis: No Fluctuance: No Erythema: No Friable: No Hemosiderin Staining: No Induration: No Mottled: No Localized Edema: No Pallor: No Rash: No Rubor: No Scarring: No Temperature / Pain Moisture Temperature: No Abnormality No Abnormalities Noted: No Dry / Scaly: Yes Maceration: No Moist: No Wound Preparation Ulcer Cleansing: Rinsed/Irrigated with Saline Topical Anesthetic Applied: Other: Lidocaine 4%, Treatment Notes Wound #3 (Left, Plantar Metatarsal head first) 1. Cleansed with: Clean wound with Normal Saline 2. Anesthetic Topical  Lidocaine 4% cream to wound bed prior to debridement 4. Dressing Applied: Other dressing (specify in notes) 5. Secondary Dressing Applied ABD and Kerlix/Conform Electronic Signature(s) Signed: 03/21/2015 5:52:14 PM By: Curtis Sites Entered By: Curtis Sites on 03/21/2015 13:19:10 DASIR, WERITO ([id number]) SHARBEL, CIOFFI ([id number]) -------------------------------------------------------------------------------- Wound Assessment Details Patient Name: [name]. Date of Service: 03/21/2015 1:00 PM Medical Record Number: [id number] Patient Account Number: [id number] Date of Birth/Sex: November 24, 1965 (49 y.o. Male) Treating RN: Curtis Sites Primary Care Physician: Other Clinician: Referring Physician: Merwyn Katos Treating Physician/Extender: Rudene Re in Treatment: 3 Wound Status Wound Number: 4 Primary Diabetic Wound/Ulcer of the Lower Etiology: Extremity Wound Location: Right Foot - Dorsal Wound Open Wounding Event: Blister Status: Date Acquired: 03/18/2015 Comorbid Chronic sinus problems/congestion, Weeks Of Treatment: 0 History: Anemia, Asthma, Hypertension, Clustered Wound: No Myocardial Infarction, Type II Diabetes, Gout, Rheumatoid Arthritis, Osteoarthritis, Neuropathy, Confinement Anxiety Photos Photo Uploaded By: Curtis Sites on 03/21/2015 17:13:35 Wound Measurements Length: (cm) 1.4 Width: (cm) 1.2 Depth: (cm) 0.1 Area: (cm) 1.319 Volume: (cm) 0.132 % Reduction in Area: % Reduction in Volume: Epithelialization: None Tunneling: No Undermining: No Wound Description Classification: Grade 1 Wound Margin: Flat and Intact Exudate Amount: Medium Exudate Type: Serous Exudate Color: amber Wound Bed Granulation Amount: Large (67-100%) Exposed Structure SAVINO, TRUESDELL ([id number]) Granulation Quality: Red Fascia Exposed: No Necrotic Amount: None Present (0%) Fat Layer Exposed: No Tendon Exposed: No Muscle Exposed: No Joint Exposed: No Bone Exposed: No Limited to Skin Breakdown Periwound Skin Texture Texture Color No Abnormalities Noted: No No Abnormalities Noted: No Callus: No Atrophie Blanche: No Crepitus: No Cyanosis: No Excoriation: No Ecchymosis: No Fluctuance: No Erythema: No Friable: No Hemosiderin Staining: No Induration: No Mottled: No Localized Edema: No Pallor: No Rash: No Rubor: No Scarring: No Moisture No Abnormalities Noted: No Dry / Scaly: No Maceration: No Moist: No Wound Preparation Ulcer Cleansing: Rinsed/Irrigated with Saline Topical Anesthetic Applied: Other: lidocaine 4%, Treatment Notes Wound #4 (Right, Dorsal Foot) 1.  Cleansed with: Clean wound with Normal Saline 2. Anesthetic Topical Lidocaine 4% cream to wound bed prior to debridement 3. Peri-wound Care: Skin Prep 4. Dressing Applied: Other dressing (specify in notes) 5. Secondary Dressing Applied Bordered Foam Dressing Notes sorbact Electronic Signature(s) SHAVON, TOMSON ([id number]) Signed: 03/21/2015 5:52:14 PM By: Curtis Sites Entered By: Curtis Sites on 03/21/2015 13:18:38 [name] ([id number]) -------------------------------------------------------------------------------- Vitals Details Patient Name: [name]. Date of Service: 03/21/2015 1:00 PM Medical Record Number: [id number] Patient Account Number: [id number] Date of Birth/Sex: November 04, 1965 (49 y.o. Male) Treating RN: Curtis Sites Primary Care Physician: Other Clinician: Referring Physician: Merwyn Katos Treating Physician/Extender: Rudene Re in Treatment: 3 Vital Signs Time Taken: 13:05 Temperature (F): 97.8 Height (in): 72 Respiratory Rate (breaths/min): 18 Weight (lbs): 228 Blood Pressure (mmHg): 112/62 Body Mass Index (BMI): 30.9 Reference Range: 80 - 120 mg / dl Electronic Signature(s) Signed: 03/21/2015 5:52:14 PM By: Curtis Sites Entered By: Curtis Sites on 03/21/2015 13:05:34

## 2015-03-26 NOTE — Progress Notes (Signed)
SATCHEL, HEIDINGER ([id number]) Visit Report for 03/21/2015 Chief Complaint Document Details Patient Name: [name], [name]. Date of Service: 03/21/2015 1:00 PM Medical Record Number: [id number] Patient Account Number: [id number] Date of Birth/Sex: 1965/07/16 (49 y.o. Male) Treating RN: Huel Coventry Primary Care Physician: Other Clinician: Referring Physician: Merwyn Katos Treating Physician/Extender: Rudene Re in Treatment: 3 Information Obtained from: Patient Chief Complaint Patients presents for treatment of an open diabetic ulcer. He presents with ulcers on both feet which she's had for over a year and a half. He has been seen at the wound care center at [facility] and was a patient of Dr. Leanord Hawking for about 2 months Electronic Signature(s) Signed: 03/21/2015 2:01:30 PM By: Evlyn Kanner MD, FACS Entered By: Evlyn Kanner on 03/21/2015 14:01:29 [name] ([id number]) -------------------------------------------------------------------------------- Debridement Details Patient Name: [name]. Date of Service: 03/21/2015 1:00 PM Medical Record Number: [id number] Patient Account Number: [id number] Date of Birth/Sex: Jun 26, 1965 (49 y.o. Male) Treating RN: Huel Coventry Primary Care Physician: Other Clinician: Referring Physician: Merwyn Katos Treating Physician/Extender: Rudene Re in Treatment: 3 Debridement Performed for Wound #1 Right,Plantar Foot Assessment: Performed By: Physician Evlyn Kanner, MD Debridement: Debridement Pre-procedure Yes Verification/Time Out Taken: Start Time: 13:30 Pain Control: Lidocaine 4% Topical Solution Level: Skin/Subcutaneous Tissue Total Area Debrided (L x 1.2 (cm) x 1 (cm) = 1.2 (cm) W): Tissue and other Viable, Non-Viable, Callus, Fibrin/Slough, Subcutaneous material debrided: Instrument: Curette Bleeding: Minimum Hemostasis Achieved: Pressure End Time: 13:36 Procedural Pain: 0 Post Procedural Pain: 0 Response  to Treatment: Procedure was tolerated well Post Debridement Measurements of Total Wound Length: (cm) 1.2 Width: (cm) 1 Depth: (cm) 0.3 Volume: (cm) 0.283 Post Procedure Diagnosis Same as Pre-procedure Electronic Signature(s) Signed: 03/21/2015 2:01:15 PM By: Evlyn Kanner MD, FACS Signed: 03/25/2015 5:12:08 PM By: Elliot Gurney RN, BSN, Kim RN, BSN Entered By: Evlyn Kanner on 03/21/2015 14:01:14 [name], [name] ([id number]) -------------------------------------------------------------------------------- Debridement Details Patient Name: [name]. Date of Service: 03/21/2015 1:00 PM Medical Record Number: [id number] Patient Account Number: [id number] Date of Birth/Sex: May 12, 1965 (49 y.o. Male) Treating RN: Huel Coventry Primary Care Physician: Other Clinician: Referring Physician: Merwyn Katos Treating Physician/Extender: Rudene Re in Treatment: 3 Debridement Performed for Wound #3 Left,Plantar Metatarsal head first Assessment: Performed By: Physician Evlyn Kanner, MD Debridement: Debridement Pre-procedure Yes Verification/Time Out Taken: Start Time: 13:36 Pain Control: Lidocaine 4% Topical Solution Level: Skin/Subcutaneous Tissue Total Area Debrided (L x 1.1 (cm) x 0.9 (cm) = 0.99 (cm) W): Tissue and other Viable, Non-Viable, Callus, Fibrin/Slough, Subcutaneous material debrided: Instrument: Curette Bleeding: Minimum Hemostasis Achieved: Pressure End Time: 13:42 Procedural Pain: 0 Post Procedural Pain: 0 Response to Treatment: Procedure was tolerated well Post Debridement Measurements of Total Wound Length: (cm) 1.1 Width: (cm) 0.9 Depth: (cm) 0.4 Volume: (cm) 0.311 Post Procedure Diagnosis Same as Pre-procedure Electronic Signature(s) Signed: 03/21/2015 2:01:22 PM By: Evlyn Kanner MD, FACS Signed: 03/25/2015 5:12:08 PM By: Elliot Gurney RN, BSN, Kim RN, BSN Entered By: Evlyn Kanner on 03/21/2015 14:01:22 [name], [name]  ([id number]) -------------------------------------------------------------------------------- HPI Details Patient Name: [name]. Date of Service: 03/21/2015 1:00 PM Medical Record Number: [id number] Patient Account Number: [id number] Date of Birth/Sex: 12/08/1965 (49 y.o. Male) Treating RN: Huel Coventry Primary Care Physician: Other Clinician: Referring Physician: Merwyn Katos Treating Physician/Extender: Rudene Re in Treatment: 3 History of Present Illness Location: ulcers on the plantar aspect of both feet for over a year and a half Quality: Patient reports No Pain. Severity: Patient states wound are getting worse. Duration: Patient  has had the wound for > 18 months prior to seeking treatment at the wound center Context: The wound appeared gradually over time Modifying Factors: Other treatment(s) tried include: his podiatrist has been treating him with Regranex and he has been seen at the [facility] wound care center and treated with surgical debridement, antibiotics and total contact cast. Associated Signs and Symptoms: Patient reports having increase discharge. HPI Description: This is a 49 year old man who comes in for review of wounds on his bilateral plantar feet which are been present for one and a half year. he has been seen at the [facility] wound care center by Dr. Leanord Hawking and was treated there for a couple of months and I'm not entirely sure why he changed to this center. He says it was difficult for him to get an appointment due to shortage of staff. The last time around he had a total contact cast which he cut himself because he was unable to go back to [place]. He has severe diabetic neuropathy with lower extremity pain. He has had a wound over his left plantar first metatarsal head and a wound on the right foot at roughly the second and third plantar metatarsal heads. He has a Darco sandal on the left and a healing sandal on the right. For roughly the last  8 months. He has been using Regranex and this was been followed by his podiatrist at [place] [facility] He has had multiple x-rays of his feet according to the patient that have not shown infection. He has not had an MRI. He was treated with doxycycline and Septra in the past for a staph aureus infection. He has never been a smoker. he goes to a pain clinic for pain medications and is regularly seen for his primary care needs by his PCP. 03/21/2015 -- the patient failed to keep his appointment for a cast change on Monday because of transportation issues. He said by Tuesday he managed to go to the local hospital and get the cast open. He has an abrasion on the dorsum of his right foot and this may have been caused by the cast rubbing on his foot. His blood sugars have been okay. Electronic Signature(s) Signed: 03/21/2015 2:02:31 PM By: Evlyn Kanner MD, FACS Entered By: Evlyn Kanner on 03/21/2015 14:02:31 [name] ([id number]) -------------------------------------------------------------------------------- Physical Exam Details Patient Name: [name]. Date of Service: 03/21/2015 1:00 PM Medical Record Number: [id number] Patient Account Number: [id number] Date of Birth/Sex: [birth year] (49 y.o. Male) Treating RN: Huel Coventry Primary Care Physician: Other Clinician: Referring Physician: Merwyn Katos Treating Physician/Extender: Rudene Re in Treatment: 3 Constitutional . Pulse regular. Respirations normal and unlabored. Afebrile. . Eyes Nonicteric. Reactive to light. Ears, Nose, Mouth, and Throat Lips, teeth, and gums WNL.Marland Kitchen Moist mucosa without lesions . Neck supple and nontender. No palpable supraclavicular or cervical adenopathy. Normal sized without goiter. Respiratory WNL. No retractions.. Cardiovascular Pedal Pulses WNL. No clubbing, cyanosis or edema. Chest Breasts symmetical and no nipple discharge.. Breast tissue WNL, no masses,  lumps, or tenderness.. Lymphatic No adneopathy. No adenopathy. No adenopathy. Musculoskeletal Adexa without tenderness or enlargement.. Digits and nails w/o clubbing, cyanosis, infection, petechiae, ischemia, or inflammatory conditions.. Integumentary (Hair, Skin) No suspicious lesions. No crepitus or fluctuance. No peri-wound warmth or erythema. No masses.Marland Kitchen Psychiatric Judgement and insight Intact.. No evidence of depression, anxiety, or agitation.. Notes on the dorsum of his right foot he has 2 superficial ulcerations close to each other and these are fairly clean.  On the under aspect of the right foot the ulcer need sharp debridement with a curette to remove both callus and subcutaneous tissue. The diabetic foot ulcer on the left plantar aspect also need sharp debridement with a curette both subcutaneous and for the callus. Electronic Signature(s) Signed: 03/21/2015 2:03:56 PM By: Evlyn Kanner MD, FACS Entered By: Evlyn Kanner on 03/21/2015 14:03:56 [name] ([id number]) -------------------------------------------------------------------------------- Physician Orders Details Patient Name: [name]. Date of Service: 03/21/2015 1:00 PM Medical Record Number: [id number] Patient Account Number: [id number] Date of Birth/Sex: 1966-04-11 (49 y.o. 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 Wound #3 Left,Plantar Metatarsal head first o Clean wound with Normal Saline. Wound #4 Right,Dorsal Foot o Clean wound with Normal Saline. Anesthetic Wound #1 Right,Plantar Foot o Topical Lidocaine 4% cream applied to wound bed prior to debridement Wound #3 Left,Plantar Metatarsal head first o Topical Lidocaine 4% cream applied to wound bed prior to debridement Wound #4 Right,Dorsal Foot o Topical Lidocaine 4% cream applied to wound bed prior to debridement Primary Wound Dressing Wound #1 Right,Plantar Foot o Other: - sorbact Wound #3 Left,Plantar Metatarsal head first o Other: - sorbact Wound #4 Right,Dorsal Foot IVAR, DOMANGUE ([id number]) o Other: - sorbact Secondary Dressing Wound #1 Right,Plantar Foot o Gauze, ABD and Kerlix/Conform Wound #3 Left,Plantar Metatarsal head first o Gauze, ABD and Kerlix/Conform Wound #4 Right,Dorsal Foot o Gauze, ABD and Kerlix/Conform Dressing Change Frequency Wound #1 Right,Plantar Foot o Change dressing every other day. Wound #3 Left,Plantar Metatarsal head first o Change dressing every other day. Wound #4 Right,Dorsal Foot o Change dressing every week Follow-up Appointments Wound #1 Right,Plantar Foot o Return Appointment in 1 week. Wound #3 Left,Plantar Metatarsal head first o Return Appointment in 1 week. Wound #4 Right,Dorsal Foot o Return Appointment in 1 week. Off-Loading Wound #1 Right,Plantar Foot o Open toe surgical shoe to: - front off-loader to left o Open toe surgical shoe with peg assist. - right Wound #3 Left,Plantar Metatarsal head first o Open toe surgical shoe to: - front off-loader to left o Open toe surgical shoe with peg assist. - right Wound #4 Right,Dorsal Foot o Open toe surgical shoe to: - front off-loader to left o Open toe surgical shoe with peg assist. - right Additional Orders /  Instructions Wound #1 Right,Plantar Foot Hellstrom, [name] ([id number]) o Increase protein intake. Wound #3 Left,Plantar Metatarsal head first o Increase protein intake. Wound #4 Right,Dorsal Foot o Increase protein intake. Electronic Signature(s) Signed: 03/21/2015 4:57:15 PM By: Curtis Sites Signed: 03/22/2015 4:40:44 PM By: Evlyn Kanner MD, FACS Previous Signature: 03/21/2015 4:55:52 PM Version By: Curtis Sites Entered By: Curtis Sites on 03/21/2015 16:57:15 Sligh, [name] ([id number]) -------------------------------------------------------------------------------- Problem List Details Patient Name: JABE, JEANBAPTISTE. Date of Service: 03/21/2015 1:00 PM Medical Record Number: [id number] Patient Account Number: [id number] Date of Birth/Sex: 08/25/1965 (49 y.o. Male) Treating RN: Huel Coventry Primary Care Physician: Other Clinician: Referring Physician: Merwyn Katos Treating Physician/Extender: Meyer Russel,  Woodson Macha Weeks in Treatment: 3 Active Problems ICD-10 Encounter Code Description Active Date Diagnosis E11.621 Type 2 diabetes mellitus with foot ulcer 02/28/2015 Yes E11.42 Type 2 diabetes mellitus with diabetic polyneuropathy 02/28/2015 Yes L97.512 Non-pressure chronic ulcer of other part of right foot with 02/28/2015 Yes fat layer exposed L97.522 Non-pressure chronic ulcer of other part of left foot with fat 02/28/2015 Yes layer exposed L84 Corns and callosities 02/28/2015 Yes Inactive Problems Resolved Problems Electronic Signature(s) Signed: 03/21/2015 2:01:06 PM By: Evlyn Kanner MD, FACS Entered By: Evlyn Kanner on 03/21/2015 14:01:06 [name] ([id number]) -------------------------------------------------------------------------------- Progress Note Details Patient Name: William Amy D. Date of Service: 03/21/2015 1:00 PM Medical Record Number: [id number] Patient Account Number: [id number] Date of Birth/Sex: [birth year] (49 y.o. Male) Treating RN: Huel Coventry Primary Care Physician: Other Clinician: Referring Physician: Merwyn Katos Treating Physician/Extender: Rudene Re in Treatment: 3 Subjective Chief Complaint Information obtained from Patient Patients presents for treatment of an open diabetic ulcer. He presents with ulcers on both feet which she's had for over a year and a half. He has been seen at the wound care center at [facility] and was a patient of Dr. Leanord Hawking for about 2 months History of Present Illness (HPI) The following HPI elements were documented for the patient's wound: Location: ulcers on the plantar aspect of both feet for over a year and a half Quality: Patient reports No Pain. Severity: Patient states wound are getting worse. Duration: Patient has had the wound for > 18 months prior to seeking treatment at the wound center Context: The wound appeared gradually over time Modifying Factors: Other treatment(s) tried include: his podiatrist has been treating him with Regranex and he has been seen at the [facility] wound care center and treated with surgical debridement, antibiotics and total contact cast. Associated Signs and Symptoms: Patient reports having increase discharge. This is a 49 year old man who comes in for review of wounds on his bilateral plantar feet which are been present for one and a half year. he has been seen at the [facility] wound care center by Dr. Leanord Hawking and was treated there for a couple of months and I'm not entirely sure why he changed to this center. He says it was difficult for him to get an appointment due to shortage of staff. The last time around he had a total contact cast which he cut himself because he was unable to go back to [place]. He has severe diabetic neuropathy with lower extremity pain. He has had a wound over his left plantar first metatarsal head and a wound on the right foot at roughly the second and third plantar metatarsal heads. He has a Darco sandal on  the left and a healing sandal on the right. For roughly the last 8 months. He has been using Regranex and this was been followed by his podiatrist at [place] [facility] He has had multiple x-rays of his feet according to the patient that have not shown infection. He has not had an MRI. He was treated with doxycycline and Septra in the past for a staph aureus infection. He has never been a smoker. he goes to a pain clinic for pain medications and is regularly seen for his primary care needs by his PCP. 03/21/2015 -- the patient failed to keep his appointment for a cast change on Monday because of transportation issues. He said by Tuesday he managed to go to the local hospital and get the cast open. He has an  abrasion on the dorsum of his right foot and this may have been caused by the cast rubbing on his [name], [name] ([id number]) foot. His blood sugars have been okay. Objective Constitutional Pulse regular. Respirations normal and unlabored. Afebrile. Vitals Time Taken: 1:05 PM, Height: 72 in, Weight: 228 lbs, BMI: 30.9, Temperature: 97.8 F, Respiratory Rate: 18 breaths/min, Blood Pressure: 112/62 mmHg. Eyes Nonicteric. Reactive to light. Ears, Nose, Mouth, and Throat Lips, teeth, and gums WNL.Marland Kitchen Moist mucosa without lesions . Neck supple and nontender. No palpable supraclavicular or cervical adenopathy. Normal sized without goiter. Respiratory WNL. No retractions.. Cardiovascular Pedal Pulses WNL. No clubbing, cyanosis or edema. Chest Breasts symmetical and no nipple discharge.. Breast tissue WNL, no masses, lumps, or tenderness.. Lymphatic No adneopathy. No adenopathy. No adenopathy. Musculoskeletal Adexa without tenderness or enlargement.. Digits and nails w/o clubbing, cyanosis, infection, petechiae, ischemia, or inflammatory conditions.Marland Kitchen Psychiatric Judgement and insight Intact.. No evidence of depression, anxiety, or agitation.. General Notes: on  the dorsum of his right foot he has 2 superficial ulcerations close to each other and these are fairly clean. On the under aspect of the right foot the ulcer need sharp debridement with a curette to remove both callus and subcutaneous tissue. The diabetic foot ulcer on the left plantar aspect also need sharp debridement with a curette both subcutaneous and for the callus. [name], [name] ([id number]) Integumentary (Hair, Skin) No suspicious lesions. No crepitus or fluctuance. No peri-wound warmth or erythema. No masses.. Wound #1 status is Open. Original cause of wound was Pressure Injury. The wound is located on the Right,Plantar Foot. The wound measures 1.2cm length x 1cm width x 0.3cm depth; 0.942cm^2 area and 0.283cm^3 volume. The wound is limited to skin breakdown. There is no tunneling or undermining noted. There is a medium amount of serosanguineous drainage noted. The wound margin is thickened. There is large (67-100%) red granulation within the wound bed. There is a small (1-33%) amount of necrotic tissue within the wound bed including Adherent Slough. The periwound skin appearance exhibited: Callus, Moist. The periwound skin appearance did not exhibit: Crepitus, Excoriation, Fluctuance, Friable, Induration, Localized Edema, Rash, Scarring, Dry/Scaly, Maceration, Atrophie Blanche, Cyanosis, Ecchymosis, Hemosiderin Staining, Mottled, Pallor, Rubor, Erythema. Periwound temperature was noted as No Abnormality. The periwound has tenderness on palpation. Wound #2 status is Open. Original cause of wound was Pressure Injury. The wound is located on the Right,Lateral,Plantar Metatarsal head fifth. The wound measures 0cm length x 0cm width x 0cm depth; 0cm^2 area and 0cm^3 volume. Wound #3 status is Open. Original cause of wound was Pressure Injury. The wound is located on the Left,Plantar Metatarsal head first. The wound measures 1.1cm length x 0.9cm width x 0.4cm depth; 0.778cm^2 area and  0.311cm^3 volume. The wound is limited to skin breakdown. There is no tunneling or undermining noted. There is a medium amount of serosanguineous drainage noted. The wound margin is distinct with the outline attached to the wound base. There is medium (34-66%) granulation within the wound bed. There is a small (1-33%) amount of necrotic tissue within the wound bed including Adherent Slough. The periwound skin appearance exhibited: Callus, Dry/Scaly. The periwound skin appearance did not exhibit: Crepitus, Excoriation, Fluctuance, Friable, Induration, Localized Edema, Rash, Scarring, Maceration, Moist, Atrophie Blanche, Cyanosis, Ecchymosis, Hemosiderin Staining, Mottled, Pallor, Rubor, Erythema. Periwound temperature was noted as No Abnormality. Wound #4 status is Open. Original cause of wound was Blister. The wound is located on the Right,Dorsal Foot. The wound measures 1.4cm length x 1.2cm width x 0.1cm  depth; 1.319cm^2 area and 0.132cm^3 volume. The wound is limited to skin breakdown. There is no tunneling or undermining noted. There is a medium amount of serous drainage noted. The wound margin is flat and intact. There is large (67-100%) red granulation within the wound bed. There is no necrotic tissue within the wound bed. The periwound skin appearance did not exhibit: Callus, Crepitus, Excoriation, Fluctuance, Friable, Induration, Localized Edema, Rash, Scarring, Dry/Scaly, Maceration, Moist, Atrophie Blanche, Cyanosis, Ecchymosis, Hemosiderin Staining, Mottled, Pallor, Rubor, Erythema. Assessment Active Problems ICD-10 E11.621 - Type 2 diabetes mellitus with foot ulcer E11.42 - Type 2 diabetes mellitus with diabetic polyneuropathy L97.512 - Non-pressure chronic ulcer of other part of right foot with fat layer exposed L97.522 - Non-pressure chronic ulcer of other part of left foot with fat layer exposed Deihl, Sang D. ([id number]) L84 - Corns and callosities The patient does not want  to have a total contact cast today as he has had a lot of discomfort by the application of the last one. He would like to wait a week and come back next Thursday for application of a total contact cast. I have also discussed with him the application of a DH boot T does not feel he will be able to afford this and will let is no neck week. I have recommended that the use Sorbact to be packed into both these wounds and apply some Drawtext as he has a lot of drainage and some felt off loading. He has been using a Darco front off loading shoe on the left. He will wear a surgical shoe which is offloading for the right for this week. On the dorsum of his right foot where he has a superficial injury we will apply some sort of backed and bordered foam. He will come back and see as next Thursday. Procedures Wound #1 Wound #1 is a Diabetic Wound/Ulcer of the Lower Extremity located on the Right,Plantar Foot . There was a Skin/Subcutaneous Tissue Debridement ([id number]) debridement with total area of 1.2 sq cm performed by Evlyn Kanner, MD. with the following instrument(s): Curette to remove Viable and Non-Viable tissue/material including Fibrin/Slough, Callus, and Subcutaneous after achieving pain control using Lidocaine 4% Topical Solution. A time out was conducted prior to the start of the procedure. A Minimum amount of bleeding was controlled with Pressure. The procedure was tolerated well with a pain level of 0 throughout and a pain level of 0 following the procedure. Post Debridement Measurements: 1.2cm length x 1cm width x 0.3cm depth; 0.283cm^3 volume. Post procedure Diagnosis Wound #1: Same as Pre-Procedure Wound #3 Wound #3 is a Diabetic Wound/Ulcer of the Lower Extremity located on the Left,Plantar Metatarsal head first . There was a Skin/Subcutaneous Tissue Debridement ([id number]) debridement with total area of 0.99 sq cm performed by Evlyn Kanner, MD. with the following instrument(s):  Curette to remove Viable and Non-Viable tissue/material including Fibrin/Slough, Callus, and Subcutaneous after achieving pain control using Lidocaine 4% Topical Solution. A time out was conducted prior to the start of the procedure. A Minimum amount of bleeding was controlled with Pressure. The procedure was tolerated well with a pain level of 0 throughout and a pain level of 0 following the procedure. Post Debridement Measurements: 1.1cm length x 0.9cm width x 0.4cm depth; 0.311cm^3 volume. Post procedure Diagnosis Wound #3: Same as Pre-Procedure Plan [name], [name] ([id number]) Wound Cleansing: Wound #1 Right,Plantar Foot: Clean wound with Normal Saline. Wound #3 Left,Plantar Metatarsal head first: Clean wound with Normal Saline. Wound #4 Right,Dorsal Foot:  Clean wound with Normal Saline. Anesthetic: Wound #1 Right,Plantar Foot: Topical Lidocaine 4% cream applied to wound bed prior to debridement Wound #3 Left,Plantar Metatarsal head first: Topical Lidocaine 4% cream applied to wound bed prior to debridement Wound #4 Right,Dorsal Foot: Topical Lidocaine 4% cream applied to wound bed prior to debridement Primary Wound Dressing: Wound #1 Right,Plantar Foot: Other: - sorbact Wound #3 Left,Plantar Metatarsal head first: Other: - sorbact Wound #4 Right,Dorsal Foot: Other: - sorbact Secondary Dressing: Wound #1 Right,Plantar Foot: Gauze, ABD and Kerlix/Conform Wound #3 Left,Plantar Metatarsal head first: Gauze, ABD and Kerlix/Conform Wound #4 Right,Dorsal Foot: Gauze, ABD and Kerlix/Conform Dressing Change Frequency: Wound #1 Right,Plantar Foot: Change dressing every other day. Wound #3 Left,Plantar Metatarsal head first: Change dressing every other day. Wound #4 Right,Dorsal Foot: Change dressing every week Follow-up Appointments: Wound #1 Right,Plantar Foot: Return Appointment in 1 week. Wound #3 Left,Plantar Metatarsal head first: Return Appointment in 1 week. Wound  #4 Right,Dorsal Foot: Return Appointment in 1 week. Off-Loading: Wound #1 Right,Plantar Foot: Open toe surgical shoe to: - front off-loader to left Open toe surgical shoe with peg assist. - right Wound #3 Left,Plantar Metatarsal head first: Open toe surgical shoe to: - front off-loader to left Open toe surgical shoe with peg assist. - right Wound #4 Right,Dorsal Foot: HICKSHector, Taft. ([id number]) Open toe surgical shoe to: - front off-loader to left Open toe surgical shoe with peg assist. - right Additional Orders / Instructions: Wound #1 Right,Plantar Foot: Increase protein intake. Wound #3 Left,Plantar Metatarsal head first: Increase protein intake. Wound #4 Right,Dorsal Foot: Increase protein intake. The patient does not want to have a total contact cast today as he has had a lot of discomfort by the application of the last one. He would like to wait a week and come back next Thursday for application of a total contact cast. I have also discussed with him the application of a DH boot T does not feel he will be able to afford this and will let is no neck week. I have recommended that the use Sorbact to be packed into both these wounds and apply some Drawtext as he has a lot of drainage and some felt off loading. He has been using a Darco front off loading shoe on the left. He will wear a surgical shoe which is offloading for the right for this week. On the dorsum of his right foot where he has a superficial injury we will apply some sort of backed and bordered foam. He will come back and see as next Thursday. Electronic Signature(s) Signed: 03/22/2015 3:28:58 PM By: Evlyn Kanner MD, FACS Previous Signature: 03/21/2015 2:06:57 PM Version By: Evlyn Kanner MD, FACS Entered By: Evlyn Kanner on 03/22/2015 15:28:58 [name] ([id number]) -------------------------------------------------------------------------------- SuperBill Details Patient Name: [name]. Date of  Service: 03/21/2015 Medical Record Number: [id number] Patient Account Number: [id number] Date of Birth/Sex: Apr 03, 1966 (49 y.o. 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.522 Non-pressure chronic ulcer of other part of left foot with fat layer exposed L84 Corns and callosities Facility Procedures CPT4 Code Description: 41324401 11042 - DEB SUBQ TISSUE 20 SQ CM/< ICD-10 Description Diagnosis E11.621 Type 2 diabetes mellitus with foot ulcer  L97.512 Non-pressure chronic ulcer of other part of right foo L97.522 Non-pressure chronic ulcer of other  part of left foot L84 Corns and callosities Modifier: t with fat la with fat lay Quantity: 1 yer exposed er exposed Physician Procedures CPT4 Code Description: 2482500 11042 - WC PHYS SUBQ TISS 20 SQ CM ICD-10 Description Diagnosis E11.621 Type 2 diabetes mellitus with foot ulcer L97.512 Non-pressure chronic ulcer of other part of right foo L97.522 Non-pressure chronic ulcer of other part  of left foot L84 Corns and callosities Modifier: t with fat lay with fat laye Quantity: 1 er exposed r exposed Electronic Signature(s) Signed: 03/21/2015 2:07:12 PM By: Evlyn Kanner MD, FACS Entered By: Evlyn Kanner on 03/21/2015 14:07:12

## 2015-03-28 ENCOUNTER — Encounter: Payer: Medicaid Other | Admitting: Surgery

## 2015-03-28 DIAGNOSIS — E11621 Type 2 diabetes mellitus with foot ulcer: Secondary | ICD-10-CM | POA: Diagnosis not present

## 2015-03-30 NOTE — Progress Notes (Signed)
[name] ([id number]) Visit Report for 03/28/2015 Arrival Information Details Patient Name: [name], [name]. Date of Service: 03/28/2015 9:45 AM Medical Record Number: [id number] Patient Account Number: [id number] Date of Birth/Sex: 1965/06/16 (49 y.o. Male) Treating RN: Huel Coventry Primary Care Physician: Other Clinician: Referring Physician: Merwyn Katos Treating Physician/Extender: Rudene Re in Treatment: 4 Visit Information History Since Last Visit Added or deleted any medications: No Patient Arrived: Ambulatory Any new allergies or adverse reactions: No Arrival Time: 09:47 Had a fall or experienced change in No Accompanied By: self activities of daily living that may affect Transfer Assistance: None risk of falls: Patient Identification Verified: Yes Signs or symptoms of abuse/neglect No Secondary Verification Process Yes since last visito Completed: Hospitalized since last visit: No Patient Has Alerts: Yes Has Dressing in Place as Prescribed: Yes Patient Alerts: Type II Has Footwear/Offloading in Place as Yes Diabetic Prescribed: Left: Surgical Shoe with Pressure Relief Insole Right: Wedge Shoe Pain Present Now: Yes Electronic Signature(s) Signed: 03/29/2015 3:49:12 PM By: Elliot Gurney, RN, BSN, Kim RN, BSN Entered By: Elliot Gurney, RN, BSN, Kim on 03/28/2015 09:48:17 [name] ([id number]) -------------------------------------------------------------------------------- Encounter Discharge Information Details Patient Name: [name]. Date of Service: 03/28/2015 9:45 AM Medical Record Number: [id number] Patient Account Number: [id number] Date of Birth/Sex: [birth year] (49 y.o. Male) Treating RN: Huel Coventry Primary Care Physician: Other Clinician: Referring Physician: Merwyn Katos Treating Physician/Extender: Rudene Re in Treatment: 4 Encounter Discharge Information Items Discharge Pain Level: 0 Discharge Condition: Stable Ambulatory  Status: Ambulatory Discharge Destination: Home Transportation: Private Auto Accompanied By: self Schedule Follow-up Appointment: Yes Medication Reconciliation completed and provided to Patient/Care Yes Jetaime Pinnix: Provided on Clinical Summary of Care: 03/28/2015 Form Type Recipient Paper Patient [facility] Electronic Signature(s) Signed: 03/28/2015 10:36:00 AM By: Gwenlyn Perking Entered By: Gwenlyn Perking on 03/28/2015 10:36:00 [name] ([id number]) -------------------------------------------------------------------------------- Lower Extremity Assessment Details Patient Name: [name]. Date of Service: 03/28/2015 9:45 AM Medical Record Number: [id number] Patient Account Number: [id number] Date of Birth/Sex: 1965/05/11 (49 y.o. Male) Treating RN: Huel Coventry Primary Care Physician: Other Clinician: Referring Physician: Merwyn Katos Treating Physician/Extender: Rudene Re in Treatment: 4 Vascular Assessment Pulses: Posterior Tibial Dorsalis Pedis Palpable: [Left:Yes] [Right:Yes] Extremity colors, hair growth, and conditions: Extremity Color: [Left:Normal] [Right:Normal] Hair Growth on Extremity: [Left:Yes] [Right:Yes] Temperature of Extremity: [Left:Warm] [Right:Warm] Capillary Refill: [Left:< 3 seconds] [Right:< 3 seconds] Toe Nail Assessment Left: Right: Thick: No No Discolored: No No Deformed: No No Improper Length and Hygiene: No No Electronic Signature(s) Signed: 03/29/2015 3:49:12 PM By: Elliot Gurney, RN, BSN, Kim RN, BSN Entered By: Elliot Gurney, RN, BSN, Kim on 03/28/2015 09:49:57 [name], [name] ([id number]) -------------------------------------------------------------------------------- Multi Wound Chart Details Patient Name: [name]. Date of Service: 03/28/2015 9:45 AM Medical Record Number: [id number] Patient Account Number: [id number] Date of Birth/Sex: [birth year] (49 y.o. Male) Treating RN: Huel Coventry Primary Care Physician: Other  Clinician: Referring Physician: Merwyn Katos Treating Physician/Extender: Rudene Re in Treatment: 4 Vital Signs Height(in): 72 Pulse(bpm): 66 Weight(lbs): 228 Blood Pressure 107/67 (mmHg): Body Mass Index(BMI): 31 Temperature(F): 97.7 Respiratory Rate 18 (breaths/min): Photos: [1:No Photos] [3:No Photos] [4:No Photos] Wound Location: [1:Right, Plantar Foot] [3:Left, Plantar Metatarsal head first] [4:Right, Dorsal Foot] Wounding Event: [1:Pressure Injury] [3:Pressure Injury] [4:Blister] Primary Etiology: [1:Diabetic Wound/Ulcer of the Lower Extremity] [3:Diabetic Wound/Ulcer of the Lower Extremity] [4:Diabetic Wound/Ulcer of the Lower Extremity] 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 Ulcer] [3:Pressure Ulcer] [4:N/A] Date Acquired: [1:02/18/2014] [3:02/18/2014] [4:03/18/2015] Weeks of Treatment: [1:4] [3:4] [4:1] Wound Status: [1:Open] [3:Open] [4:Healed - Epithelialized] Measurements  L x W x D 0.9x0.9x0.7 [3:1.2x0.9x0.6] [4:0x0x0] (cm) Area (cm) : [1:0.636] [3:0.848] [4:0] Volume (cm) : [1:0.445] [3:0.509] [4:0] % Reduction in Area: [1:35.80%] [3:1.90%] [4:100.00%] % Reduction in Volume: 35.80% [3:-47.10%] [4:100.00%] Classification: [1:Grade 2] [3:Grade 2] [4:Grade 1] Periwound Skin Texture: No Abnormalities Noted [3:No Abnormalities Noted] [4:No Abnormalities Noted] Periwound Skin [1:No Abnormalities Noted] [3:No Abnormalities Noted] [4:No Abnormalities Noted] Moisture: Periwound Skin Color: No Abnormalities Noted [3:No Abnormalities Noted] [4:No Abnormalities Noted] Tenderness on [1:No] [3:No] [4:No] Treatment Notes Electronic Signature(s) Signed: 03/29/2015 3:49:12 PM By: Elliot Gurney, RN, BSN, Kim RN, BSN [name], [name] Kitchen ([id number]) Entered By: Elliot Gurney, RN, BSN, Kim on 03/28/2015 09:59:02 [name] ([id number]) -------------------------------------------------------------------------------- Multi-Disciplinary Care Plan Details Patient Name: [name]. Date of Service: 03/28/2015 9:45 AM Medical Record Number: [id number] Patient Account Number: [id number] Date of Birth/Sex: March 24, 1966 (49 y.o. Male) Treating RN: Huel Coventry Primary Care Physician: Other Clinician: Referring Physician: Merwyn Katos Treating Physician/Extender: Rudene Re in Treatment: 4 Active Inactive Abuse / Safety / Falls / Self Care Management Nursing Diagnoses: Potential for falls Goals: Patient will remain injury free Date Initiated: 02/28/2015 Goal Status: Active Interventions: Assess fall risk on admission and as needed Notes: Nutrition Nursing Diagnoses: Impaired glucose control: actual or potential Goals: Patient/caregiver agrees to and verbalizes understanding of need to obtain nutritional consultation Date Initiated: 02/28/2015 Goal Status: Active Interventions: Assess patient nutrition upon admission and as needed per policy Notes: Orientation to the Wound Care Program Nursing Diagnoses: Knowledge deficit related to the wound healing center program Goals: Patient/caregiver will verbalize understanding of the Wound Healing Center Program Date Initiated: 02/28/2015 [name], [name] ([id number]) Goal Status: Active Interventions: Provide education on orientation to the wound center Notes: Pressure Nursing Diagnoses: Potential for impaired tissue integrity related to pressure, friction, moisture, and shear Goals: Patient will remain free of pressure ulcers Date Initiated: 02/28/2015 Goal Status: Active Interventions: Assess: immobility, friction, shearing, incontinence upon admission and as needed Notes: Wound/Skin Impairment Nursing Diagnoses: Impaired tissue integrity Goals: Ulcer/skin breakdown will heal within 14 weeks Date Initiated: 02/28/2015 Goal Status: Active Interventions: Assess patient/caregiver ability to obtain necessary supplies Notes: Electronic Signature(s) Signed: 03/29/2015 3:49:12 PM By: Elliot Gurney, RN,  BSN, Kim RN, BSN Entered By: Elliot Gurney, RN, BSN, Kim on 03/28/2015 09:58:44 [name], [name] ([id number]) -------------------------------------------------------------------------------- Pain Assessment Details Patient Name: [name]. Date of Service: 03/28/2015 9:45 AM Medical Record Number: [id number] Patient Account Number: [id number] Date of Birth/Sex: June 20, 1965 (49 y.o. Male) Treating RN: Huel Coventry Primary Care Physician: Other Clinician: Referring Physician: Merwyn Katos Treating Physician/Extender: Rudene Re in Treatment: 4 Active Problems Location of Pain Severity and Description of Pain Patient Has Paino Yes Site Locations Pain Location: Pain in Ulcers Duration of the Pain. Constant / Intermittento Constant Rate the pain. Current Pain Level: 9 Worst Pain Level: 10 Least Pain Level: 9 Character of Pain Describe the Pain: Stabbing Pain Management and Medication Current Pain Management: Electronic Signature(s) Signed: 03/29/2015 3:49:12 PM By: Elliot Gurney, RN, BSN, Kim RN, BSN Entered By: Elliot Gurney, RN, BSN, Kim on 03/28/2015 09:48:48 [name] ([id number]) -------------------------------------------------------------------------------- Patient/Caregiver Education Details Patient Name: [name]. Date of Service: 03/28/2015 9:45 AM Medical Record Number: [id number] Patient Account Number: [id number] Date of Birth/Gender: 03-27-1966 (49 y.o. Male) Treating RN: Huel Coventry Primary Care Physician: Other Clinician: Referring Physician: Merwyn Katos Treating Physician/Extender: Rudene Re in Treatment: 4 Education Assessment Education Provided To: Patient Education Topics Provided Wound/Skin Impairment: Handouts: Other: do not get cast wet Methods: Demonstration, Explain/Verbal Responses:  State content correctly Electronic Signature(s) Signed: 03/29/2015 3:49:12 PM By: Elliot Gurney, RN, BSN, Kim RN, BSN Entered By: Elliot Gurney, RN, BSN, Kim on  03/28/2015 10:20:58 [name] ([id number]) -------------------------------------------------------------------------------- Wound Assessment Details Patient Name: [name], [name]. Date of Service: 03/28/2015 9:45 AM Medical Record Number: [id number] Patient Account Number: [id number] Date of Birth/Sex: 1965/06/23 (49 y.o. Male) Treating RN: Huel Coventry Primary Care Physician: Other Clinician: Referring Physician: Merwyn Katos Treating Physician/Extender: Rudene Re in Treatment: 4 Wound Status Wound Number: 1 Primary Etiology: Diabetic Wound/Ulcer of the Lower Extremity Wound Location: Right, Plantar Foot Secondary Pressure Ulcer Wounding Event: Pressure Injury Etiology: Date Acquired: 02/18/2014 Wound Status: Open Weeks Of Treatment: 4 Clustered Wound: No Photos Photo Uploaded By: Elliot Gurney, RN, BSN, Kim on 03/28/2015 16:10:33 Wound Measurements Length: (cm) 0.9 Width: (cm) 0.9 Depth: (cm) 0.7 Area: (cm) 0.636 Volume: (cm) 0.445 % Reduction in Area: 35.8% % Reduction in Volume: 35.8% Wound Description Classification: Grade 2 Periwound Skin Texture Texture Color No Abnormalities Noted: No No Abnormalities Noted: No Moisture No Abnormalities Noted: No Treatment Notes Wound #1 (Right, Plantar Foot) 1. Cleansed withHAMISH, [name] ([id number]) Clean wound with Normal Saline 2. Anesthetic Topical Lidocaine 4% cream to wound bed prior to debridement 4. Dressing Applied: Other dressing (specify in notes) 5. Secondary Dressing Applied Foam Notes sorbact and foam fromt-offloader on left; sorbact and foam and TCC on right Electronic Signature(s) Signed: 03/29/2015 3:49:12 PM By: Elliot Gurney, RN, BSN, Kim RN, BSN Entered By: Elliot Gurney, RN, BSN, Kim on 03/28/2015 09:53:25 [name], [name] ([id number]) -------------------------------------------------------------------------------- Wound Assessment Details Patient Name: [name]. Date of Service:  03/28/2015 9:45 AM Medical Record Number: [id number] Patient Account Number: [id number] Date of Birth/Sex: [birth year] (49 y.o. Male) Treating RN: Huel Coventry Primary Care Physician: Other Clinician: Referring Physician: Merwyn Katos Treating Physician/Extender: Rudene Re in Treatment: 4 Wound Status Wound Number: 3 Primary Etiology: Diabetic Wound/Ulcer of the Lower Extremity Wound Location: Left, Plantar Metatarsal head first Secondary Pressure Ulcer Etiology: Wounding Event: Pressure Injury Wound Status: Open Date Acquired: 02/18/2014 Weeks Of Treatment: 4 Clustered Wound: No Photos Photo Uploaded By: Elliot Gurney, RN, BSN, Kim on 03/28/2015 16:13:30 Wound Measurements Length: (cm) 1.2 Width: (cm) 0.9 Depth: (cm) 0.6 Area: (cm) 0.848 Volume: (cm) 0.509 % Reduction in Area: 1.9% % Reduction in Volume: -47.1% Wound Description Classification: Grade 2 Periwound Skin Texture Texture Color No Abnormalities Noted: No No Abnormalities Noted: No Moisture No Abnormalities Noted: No Treatment Notes Wound #3 (Left, Plantar Metatarsal head first) [name], William D. ([id number]) 1. Cleansed with: Clean wound with Normal Saline 2. Anesthetic Topical Lidocaine 4% cream to wound bed prior to debridement 4. Dressing Applied: Other dressing (specify in notes) 5. Secondary Dressing Applied Foam Notes sorbact and foam fromt-offloader on left; sorbact and foam and TCC on right Electronic Signature(s) Signed: 03/29/2015 3:49:12 PM By: Elliot Gurney, RN, BSN, Kim RN, BSN Entered By: Elliot Gurney, RN, BSN, Kim on 03/28/2015 09:53:25 [name], [name] ([id number]) -------------------------------------------------------------------------------- Wound Assessment Details Patient Name: [name]. Date of Service: 03/28/2015 9:45 AM Medical Record Number: [id number] Patient Account Number: [id number] Date of Birth/Sex: 1965-05-10 (49 y.o. Male) Treating RN: Huel Coventry Primary Care  Physician: Other Clinician: Referring Physician: Merwyn Katos Treating Physician/Extender: Rudene Re in Treatment: 4 Wound Status Wound Number: 4 Primary Diabetic Wound/Ulcer of the Lower Etiology: Extremity Wound Location: Right, Dorsal Foot Wound Status: Healed - Epithelialized Wounding Event: Blister Date Acquired: 03/18/2015 Weeks Of Treatment: 1 Clustered Wound: No Photos Photo Uploaded By: Elliot Gurney, RN, BSN,  Kim on 03/28/2015 16:13:31 Wound Measurements Length: (cm) 0 % Reduction in Width: (cm) 0 % Reduction in Depth: (cm) 0 Area: (cm) 0 Volume: (cm) 0 Area: 100% Volume: 100% Wound Description Classification: Grade 1 Periwound Skin Texture Texture Color No Abnormalities Noted: No No Abnormalities Noted: No Moisture No Abnormalities Noted: No Electronic Signature(s) Signed: 03/29/2015 3:49:12 PM By: Elliot Gurney, RN, BSN, Kim RN, BSN [name], [name] ([id number]) Entered By: Elliot Gurney, RN, BSN, Kim on 03/28/2015 09:53:26 [name], [name] ([id number]) -------------------------------------------------------------------------------- Vitals Details Patient Name: [name]. Date of Service: 03/28/2015 9:45 AM Medical Record Number: [id number] Patient Account Number: [id number] Date of Birth/Sex: 1966-04-03 (49 y.o. Male) Treating RN: Huel Coventry Primary Care Physician: Other Clinician: Referring Physician: Merwyn Katos Treating Physician/Extender: Rudene Re in Treatment: 4 Vital Signs Time Taken: 09:49 Temperature (F): 97.7 Height (in): 72 Pulse (bpm): 66 Weight (lbs): 228 Respiratory Rate (breaths/min): 18 Body Mass Index (BMI): 30.9 Blood Pressure (mmHg): 107/67 Reference Range: 80 - 120 mg / dl Electronic Signature(s) Signed: 03/29/2015 3:49:12 PM By: Elliot Gurney, RN, BSN, Kim RN, BSN Entered By: Elliot Gurney, RN, BSN, Kim on 03/28/2015 09:49:08

## 2015-03-30 NOTE — Progress Notes (Signed)
EURA, RUEDA ([id number]) Visit Report for 03/28/2015 Chief Complaint Document Details Patient Name: [name], [name]. Date of Service: 03/28/2015 9:45 AM Medical Record Number: [id number] Patient Account Number: [id number] Date of Birth/Sex: 11-14-1965 (49 y.o. Male) Treating RN: Huel Coventry Primary Care Physician: Other Clinician: Referring Physician: Merwyn Katos Treating Physician/Extender: Rudene Re in Treatment: 4 Information Obtained from: Patient Chief Complaint Patients presents for treatment of an open diabetic ulcer. He presents with ulcers on both feet which she's had for over a year and a half. He has been seen at the wound care center at [facility] and was a patient of Dr. Leanord Hawking for about 2 months Electronic Signature(s) Signed: 03/28/2015 10:10:09 AM By: Evlyn Kanner MD, FACS Entered By: Evlyn Kanner on 03/28/2015 10:10:09 [name] ([id number]) -------------------------------------------------------------------------------- HPI Details Patient Name: [name]. Date of Service: 03/28/2015 9:45 AM Medical Record Number: [id number] Patient Account Number: [id number] Date of Birth/Sex: [birth year] (49 y.o. Male) Treating RN: Huel Coventry Primary Care Physician: Other Clinician: Referring Physician: Merwyn Katos Treating Physician/Extender: Rudene Re in Treatment: 4 History of Present Illness Location: ulcers on the plantar aspect of both feet for over a year and a half Quality: Patient reports No Pain. Severity: Patient states wound are getting worse. Duration: Patient has had the wound for > 18 months prior to seeking treatment at the wound center Context: The wound appeared gradually over time Modifying Factors: Other treatment(s) tried include: his podiatrist has been treating him with Regranex and he has been seen at the [facility] wound care center and treated with surgical debridement, antibiotics and total contact  cast. Associated Signs and Symptoms: Patient reports having increase discharge. HPI Description: This is a 49 year old man who comes in for review of wounds on his bilateral plantar feet which are been present for one and a half year. he has been seen at the [facility] wound care center by Dr. Leanord Hawking and was treated there for a couple of months and I'm not entirely sure why he changed to this center. He says it was difficult for him to get an appointment due to shortage of staff. The last time around he had a total contact cast which he cut himself because he was unable to go back to [place]. He has severe diabetic neuropathy with lower extremity pain. He has had a wound over his left plantar first metatarsal head and a wound on the right foot at roughly the second and third plantar metatarsal heads. He has a Darco sandal on the left and a healing sandal on the right. For roughly the last 8 months. He has been using Regranex and this was been followed by his podiatrist at [facility] He has had multiple x-rays of his feet according to the patient that have not shown infection. He has not had an MRI. He was treated with doxycycline and Septra in the past for a staph aureus infection. He has never been a smoker. he goes to a pain clinic for pain medications and is regularly seen for his primary care needs by his PCP. 03/21/2015 -- the patient failed to keep his appointment for a cast change on Monday because of transportation issues. He said by Tuesday he managed to go to the local hospital and get the cast open. He has an abrasion on the dorsum of his right foot and this may have been caused by the cast rubbing on his foot. His blood sugars have been okay. 03/28/2015 --  he agrees to have a TCC applied to his right lower extremity and be again discussed getting a DH walking boot for his left low lower extremity. Electronic Signature(s) Signed: 03/28/2015 10:10:39  AM By: Evlyn Kanner MD, FACS Entered By: Evlyn Kanner on 03/28/2015 10:10:39 [name] ([id number]) -------------------------------------------------------------------------------- Physical Exam Details Patient Name: [name]. Date of Service: 03/28/2015 9:45 AM Medical Record Number: [id number] Patient Account Number: [id number] Date of Birth/Sex: 07-Dec-1965 (49 y.o. Male) Treating RN: Huel Coventry Primary Care Physician: Other Clinician: Referring Physician: Merwyn Katos Treating Physician/Extender: Rudene Re in Treatment: 4 Constitutional . Pulse regular. Respirations normal and unlabored. Afebrile. . Eyes Nonicteric. Reactive to light. Ears, Nose, Mouth, and Throat Lips, teeth, and gums WNL.Marland Kitchen Moist mucosa without lesions . Neck supple and nontender. No palpable supraclavicular or cervical adenopathy. Normal sized without goiter. Respiratory WNL. No retractions.. Cardiovascular Pedal Pulses WNL. No clubbing, cyanosis or edema. Lymphatic No adneopathy. No adenopathy. No adenopathy. Musculoskeletal Adexa without tenderness or enlargement.. Digits and nails w/o clubbing, cyanosis, infection, petechiae, ischemia, or inflammatory conditions.. Integumentary (Hair, Skin) No suspicious lesions. No crepitus or fluctuance. No peri-wound warmth or erythema. No masses.Marland Kitchen Psychiatric Judgement and insight Intact.. No evidence of depression, anxiety, or agitation.. Notes the superficial ulceration on the dorsum of his right foot has completely healed. The plantar ulcers both feet are clean today and there is no callus buildup and there is no debridement required. Electronic Signature(s) Signed: 03/28/2015 10:11:19 AM By: Evlyn Kanner MD, FACS Entered By: Evlyn Kanner on 03/28/2015 10:11:19 [name] ([id number]) -------------------------------------------------------------------------------- Physician Orders Details Patient Name: [name]. Date  of Service: 03/28/2015 9:45 AM Medical Record Number: [id number] Patient Account Number: [id number] Date of Birth/Sex: 31-Dec-1965 (49 y.o. Male) Treating RN: Huel Coventry Primary Care Physician: Other Clinician: Referring Physician: Merwyn Katos Treating Physician/Extender: Rudene Re in Treatment: 4 Verbal / Phone Orders: Yes Clinician: Huel Coventry Read Back and Verified: Yes Diagnosis Coding Wound Cleansing Wound #1 Right,Plantar Foot o Clean wound with Normal Saline. Wound #3 Left,Plantar Metatarsal head first o Clean wound with Normal Saline. Anesthetic Wound #1 Right,Plantar Foot o Topical Lidocaine 4% cream applied to wound bed prior to debridement Wound #3 Left,Plantar Metatarsal head first o Topical Lidocaine 4% cream applied to wound bed prior to debridement Primary Wound Dressing Wound #1 Right,Plantar Foot o Other: - sorbact Wound #3 Left,Plantar Metatarsal head first o Other: - sorbact Secondary Dressing Wound #1 Right,Plantar Foot o Foam Wound #3 Left,Plantar Metatarsal head first o Gauze, ABD and Kerlix/Conform Dressing Change Frequency Wound #3 Left,Plantar Metatarsal head first o Change dressing every other day. Wound #1 Right,Plantar Foot o Change dressing every week Follow-up Appointments ALEJANDRO, GAMEL ([id number]) Wound #1 Right,Plantar Foot o Return Appointment in 1 week. Wound #3 Left,Plantar Metatarsal head first o Return Appointment in 1 week. Off-Loading Wound #3 Left,Plantar Metatarsal head first o Open toe surgical shoe to: - front off-loader to left Wound #1 Right,Plantar Foot o Total Contact Cast to Right Lower Extremity Additional Orders / Instructions Wound #1 Right,Plantar Foot o Increase protein intake. Wound #3 Left,Plantar Metatarsal head first o Increase protein intake. Electronic Signature(s) Signed: 03/28/2015 3:39:43 PM By: Evlyn Kanner MD, FACS Signed: 03/29/2015 3:49:12 PM By: Elliot Gurney  RN, BSN, Kim RN, BSN Entered By: Elliot Gurney, RN, BSN, Kim on 03/28/2015 10:00:54 TALHA, ISER ([id number]) -------------------------------------------------------------------------------- Problem List Details Patient Name: NIKIA, MANGINO. Date of Service: 03/28/2015 9:45 AM Medical Record Number: [id number] Patient Account Number: [id number] Date of Birth/Sex:  [birth year] (49 y.o. 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 Date of Service: 03/28/2015 9:45 AM Medical Record Number: [id number] Patient Account Number: [id number] Date of Birth/Sex: 03/05/1966 (49 y.o. Male) Treating RN: Huel Coventry Primary Care Physician: Other Clinician: Referring Physician: Merwyn Katos Treating Physician/Extender: Rudene Re in Treatment: 4 Subjective Chief Complaint Information obtained from Patient Patients presents for treatment of an open diabetic ulcer. He presents with ulcers on both feet which she's had for over a year and a half. He has been seen at the wound care center at [facility] and was a patient of Dr. Leanord Hawking for about 2 months History of Present Illness (HPI) The following HPI elements were documented for the patient's wound: Location: ulcers on the plantar aspect of both feet for over a year and a half Quality: Patient reports No Pain. Severity: Patient states wound are getting worse. Duration: Patient has had the wound for > 18 months prior to seeking treatment at the wound center Context: The wound appeared gradually over time Modifying Factors: Other treatment(s) tried include: his podiatrist has been treating him with Regranex and he has been seen at the [facility] wound care center and treated with surgical debridement, antibiotics and total contact cast. Associated Signs and Symptoms: Patient reports having increase discharge. This is a 49 year old man who comes in for review of wounds on his bilateral plantar feet which are been present for one and a half year. he has been seen at the [facility] wound care center by Dr. Leanord Hawking and was treated there for a couple of months and I'm not entirely sure why he changed to this center. He says it was difficult for him to get an appointment due to shortage of staff. The last time around he had a total contact cast which he cut himself because he was unable to go back to [place]. He has severe diabetic neuropathy with lower extremity pain. He has had a wound over his left plantar first metatarsal head and a wound on the right foot at roughly the second and third plantar metatarsal heads. He has a Darco sandal on the left and a healing sandal on the right. For roughly the last 8 months. He has been using Regranex and this was been followed by his podiatrist at [place] [facility] He has had multiple x-rays of his feet according to the patient that have not shown infection. He has not had an MRI. He was treated with doxycycline and Septra in the past for a staph aureus infection. He has  never been a smoker. he goes to a pain clinic for pain medications and is regularly seen for his primary care needs by his PCP. 03/21/2015 -- the patient failed to keep his appointment for a cast change on Monday because of transportation issues.  He said by Tuesday he managed to go to the local hospital and get the cast open. He has an abrasion on the dorsum of his right foot and this may have been caused by the cast rubbing on his DYLON, JERMAN ([id number]) foot. His blood sugars have been okay. 03/28/2015 -- he agrees to have a TCC applied to his right lower extremity and be again discussed getting a DH walking boot for his left low lower extremity. Objective Constitutional Pulse regular. Respirations normal and unlabored. Afebrile. Vitals Time Taken: 9:49 AM, Height: 72 in, Weight: 228 lbs, BMI: 30.9, Temperature: 97.7 F, Pulse: 66 bpm, Respiratory Rate: 18 breaths/min, Blood Pressure: 107/67 mmHg. Eyes Nonicteric. Reactive to light. Ears, Nose, Mouth, and Throat Lips, teeth, and gums WNL.Marland Kitchen Moist mucosa without lesions . Neck supple and nontender. No palpable supraclavicular or cervical adenopathy. Normal sized without goiter. Respiratory WNL. No retractions.. Cardiovascular Pedal Pulses WNL. No clubbing, cyanosis or edema. Lymphatic No adneopathy. No adenopathy. No adenopathy. Musculoskeletal Adexa without tenderness or enlargement.. Digits and nails w/o clubbing, cyanosis, infection, petechiae, ischemia, or inflammatory conditions.Marland Kitchen Psychiatric Judgement and insight Intact.. No evidence of depression, anxiety, or agitation.. General Notes: the superficial ulceration on the dorsum of his right foot has completely healed. The plantar ulcers both feet are clean today and there is no callus buildup and there is no debridement required. Integumentary (Hair, Skin) No suspicious lesions. No crepitus or fluctuance. No peri-wound warmth or erythema. No masses.Marland Kitchen GAVYNN, POLE  ([id number]) Wound #1 status is Open. Original cause of wound was Pressure Injury. The wound is located on the Right,Plantar Foot. The wound measures 0.9cm length x 0.9cm width x 0.7cm depth; 0.636cm^2 area and 0.445cm^3 volume. Wound #3 status is Open. Original cause of wound was Pressure Injury. The wound is located on the Left,Plantar Metatarsal head first. The wound measures 1.2cm length x 0.9cm width x 0.6cm depth; 0.848cm^2 area and 0.509cm^3 volume. Wound #4 status is Healed - Epithelialized. Original cause of wound was Blister. The wound is located on the Right,Dorsal Foot. The wound measures 0cm length x 0cm width x 0cm depth; 0cm^2 area and 0cm^3 volume. 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 There was a Total Contact Cast Procedure by Evlyn Kanner, MD. Post procedure Diagnosis Wound #1: Same as Pre-Procedure Notes: a total contact cast was applied on the right lower extremity in the usual precautions.. Plan Wound Cleansing: Wound #1 Right,Plantar Foot: Clean wound with Normal Saline. Wound #3 Left,Plantar Metatarsal head first: PLUMER, NEIN ([id number]) Clean wound with Normal Saline. Anesthetic: Wound #1 Right,Plantar Foot: Topical Lidocaine 4% cream applied to wound bed prior to debridement Wound #3 Left,Plantar Metatarsal head first: Topical Lidocaine 4% cream applied to wound bed prior to debridement Primary Wound Dressing: Wound #1 Right,Plantar Foot: Other: - sorbact Wound #3 Left,Plantar Metatarsal head first: Other: - sorbact Secondary  Dressing: Wound #1 Right,Plantar Foot: Foam Wound #3 Left,Plantar Metatarsal head first: Gauze, ABD and Kerlix/Conform Dressing Change Frequency: Wound #3 Left,Plantar Metatarsal head first: Change dressing every other day. Wound #1 Right,Plantar Foot: Change dressing every week Follow-up Appointments:  Wound #1 Right,Plantar Foot: Return Appointment in 1 week. Wound #3 Left,Plantar Metatarsal head first: Return Appointment in 1 week. Off-Loading: Wound #3 Left,Plantar Metatarsal head first: Open toe surgical shoe to: - front off-loader to left Wound #1 Right,Plantar Foot: Total Contact Cast to Right Lower Extremity Additional Orders / Instructions: Wound #1 Right,Plantar Foot: Increase protein intake. Wound #3 Left,Plantar Metatarsal head first: Increase protein intake. I have recommended that the use Sorbact to be packed into both these wounds and apply some Drawtext as he has a lot of drainage and some felt off loading. He has been using a Darco front off loading shoe on the left. He will have dressings changed every other day on the left foot. On the right side he has had a total contact cast applied. He will be back to see as next week. Electronic Signature(s) ROBERTO, ROMANOSKI ([id number]) Signed: 03/28/2015 10:12:31 AM By: Evlyn Kanner MD, FACS Entered By: Evlyn Kanner on 03/28/2015 10:12:31 PERETZ, THIEME ([id number]) -------------------------------------------------------------------------------- Total Contact Cast Details Patient Name: RAYQUAN, AMRHEIN. Date of Service: 03/28/2015 9:45 AM Medical Record Number: [id number] Patient Account Number: [id number] Date of Birth/Sex: 1966/01/31 (49 y.o. Male) Treating RN: Huel Coventry Primary Care Physician: Other Clinician: Referring Physician: Merwyn Katos Treating Physician/Extender: Rudene Re in Treatment: 4 Total Contact Cast Applied for Wound Wound #1 Right,Plantar Foot Assessment: Performed By: Physician  Evlyn Kanner, MD Post Procedure Diagnosis Same as Pre-procedure Notes a total contact cast was applied on the right lower extremity in the usual precautions. Electronic Signature(s) Signed: 03/28/2015 10:10:02 AM By: Evlyn Kanner MD, FACS Entered By: Evlyn Kanner on 03/28/2015 10:10:02 [name] ([id number]) -------------------------------------------------------------------------------- SuperBill Details Patient Name: [name]. Date of Service: 03/28/2015 Medical Record Number: [id number] Patient Account Number: [id number] Date of Birth/Sex: [birth year] (49 y.o. 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.522 Non-pressure chronic ulcer of other part of left foot with fat layer exposed L84 Corns and callosities Facility Procedures CPT4 Code Description: [id number] - APPLY TOTAL CONTACT LEG CAST ICD-10 Description Diagnosis E11.621 Type 2 diabetes mellitus with foot ulcer E11.42 Type 2 diabetes mellitus with diabetic polyneuropathy L97.512 Non-pressure chronic ulcer of other  part of right foo L97.522 Non-pressure chronic ulcer of other part of left foot Modifier: t with fat la with fat lay Quantity: 1 yer exposed er exposed Physician Procedures CPT4 Code Description: [id number] - WC PHYS APPLY TOTAL CONTACT CAST ICD-10 Description Diagnosis E11.621 Type 2 diabetes mellitus with foot ulcer E11.42 Type 2 diabetes mellitus with diabetic polyneuropathy L97.512 Non-pressure chronic ulcer of other  part of right foot L97.522 Non-pressure chronic ulcer of other part of left foot Modifier: with fat lay  with fat laye Quantity: 1 er exposed r exposed Electronic Signature(s) Signed: 03/28/2015 10:23:08 AM By: Evlyn Kanner MD, FACS Previous Signature: 03/28/2015 10:12:53 AM Version By: Evlyn Kanner MD, FACS Entered By: Evlyn Kanner on 03/28/2015 10:23:08

## 2015-04-04 ENCOUNTER — Encounter: Payer: Medicaid Other | Admitting: Surgery

## 2015-04-04 DIAGNOSIS — E11621 Type 2 diabetes mellitus with foot ulcer: Secondary | ICD-10-CM | POA: Diagnosis not present

## 2015-04-05 NOTE — Progress Notes (Signed)
[name], [name] ([id number]) Visit Report for 04/04/2015 Arrival Information Details Patient Name: [name], [name]. Date of Service: 04/04/2015 10:45 AM Medical Record Number: [id number] Patient Account Number: [id number] Date of Birth/Sex: July 28, 1965 (49 y.o. Male) Treating RN: Curtis Sites Primary Care Physician: Other Clinician: Referring Physician: Merwyn Katos Treating Physician/Extender: Rudene Re in Treatment: 5 Visit Information History Since Last Visit Added or deleted any medications: No Patient Arrived: Ambulatory Any new allergies or adverse reactions: No Arrival Time: 10:51 Had a fall or experienced change in No Accompanied By: self activities of daily living that may affect Transfer Assistance: None risk of falls: Patient Identification Verified: Yes Signs or symptoms of abuse/neglect since last No Secondary Verification Process Yes visito Completed: Hospitalized since last visit: No Patient Has Alerts: Yes Pain Present Now: No Patient Alerts: Type II Diabetic Electronic Signature(s) Signed: 04/04/2015 5:39:20 PM By: Curtis Sites Entered By: Curtis Sites on 04/04/2015 10:52:04 [name] ([id number]) -------------------------------------------------------------------------------- Encounter Discharge Information Details Patient Name: [name]. Date of Service: 04/04/2015 10:45 AM Medical Record Number: [id number] Patient Account Number: [id number] Date of Birth/Sex: 1965-11-06 (49 y.o. Male) Treating RN: Curtis Sites Primary Care Physician: Other Clinician: Referring Physician: Merwyn Katos Treating Physician/Extender: Rudene Re in Treatment: 5 Encounter Discharge Information Items Discharge Pain Level: 0 Discharge Condition: Stable Ambulatory Status: Cane Discharge Destination: Home Transportation: Private Auto Accompanied By: self Schedule Follow-up Appointment: Yes Medication Reconciliation completed and  provided to Patient/Care No Breshae Belcher: Provided on Clinical Summary of Care: 04/04/2015 Form Type Recipient Paper Patient [facility] Electronic Signature(s) Signed: 04/04/2015 5:39:20 PM By: Curtis Sites Previous Signature: 04/04/2015 11:42:51 AM Version By: Gwenlyn Perking Entered By: Curtis Sites on 04/04/2015 11:45:40 Pedraza, [name] ([id number]) -------------------------------------------------------------------------------- Lower Extremity Assessment Details Patient Name: [name]. Date of Service: 04/04/2015 10:45 AM Medical Record Number: [id number] Patient Account Number: [id number] Date of Birth/Sex: 1965-07-14 (49 y.o. Male) Treating RN: Curtis Sites Primary Care Physician: Other Clinician: Referring Physician: Merwyn Katos Treating Physician/Extender: Rudene Re in Treatment: 5 Vascular Assessment Pulses: Posterior Tibial Dorsalis Pedis Palpable: [Left:Yes] [Right:Yes] Extremity colors, hair growth, and conditions: Extremity Color: [Left:Normal] [Right:Normal] Hair Growth on Extremity: [Left:Yes] [Right:Yes] Temperature of Extremity: [Left:Warm] [Right:Warm] Capillary Refill: [Left:< 3 seconds] [Right:< 3 seconds] Electronic Signature(s) Signed: 04/04/2015 5:39:20 PM By: Curtis Sites Entered By: Curtis Sites on 04/04/2015 11:14:15 Christopher, [name] ([id number]) -------------------------------------------------------------------------------- Multi Wound Chart Details Patient Name: [name]. Date of Service: 04/04/2015 10:45 AM Medical Record Number: [id number] Patient Account Number: [id number] Date of Birth/Sex: 1966/04/18 (49 y.o. 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] [3:the Lower Extremity] 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 Ulcer] [3:Pressure Ulcer] [N/A:N/A] Comorbid History: [1:Chronic sinus problems/congestion, Anemia, Asthma, Hypertension, Myocardial Hypertension, Myocardial Infarction, Type II Diabetes, Gout, Rheumatoid Arthritis, Osteoarthritis, Neuropathy, Confinement Neuropathy, Confinement Anxiety]  [3:Chronic sinus problems/congestion, Anemia, Asthma, Infarction, Type II Diabetes, Gout, Rheumatoid Arthritis, Osteoarthritis, Anxiety] [N/A:N/A] Date Acquired: [1:02/18/2014] [3:02/18/2014] [N/A:N/A] Weeks of Treatment: [1:5] [3:5] [N/A:N/A] Wound Status: [1:Open] [3:Open] [N/A:N/A] Measurements L x W x D 1.2x1.2x0.5 [3:1x0.6x0.6] [N/A:N/A] (cm) Area (cm) : [1:1.131] [3:0.471] [N/A:N/A]  Volume (cm) : [1:0.565] [3:0.283] [N/A:N/A] % Reduction in Area: [1:-14.20%] [3:45.50%] [N/A:N/A] % Reduction in Volume: 18.50% [3:18.20%] [N/A:N/A] Classification: [1:Grade 2] [3:Grade 2] [N/A:N/A] Exudate Amount: [1:Medium] [3:Large] [N/A:N/A] Exudate Type: [1:Serous] [3:Serous] [N/A:N/A] Exudate Color: [1:amber] [3:amber] [N/A:N/A] Wound Margin: Flat and Intact Flat and Intact N/A Granulation Amount: Large (67-100%) Large (67-100%) N/A Granulation Quality: Red, Pink Red, Pink N/A Necrotic Amount: Small (1-33%) Small (1-33%) N/A Necrotic Tissue: Adherent Slough Eschar, Adherent Slough N/A Exposed Structures: Fascia: No Fascia: No N/A Fat: No Fat: No Tendon: No Tendon: No Muscle: No Muscle: No Joint: No Joint: No Bone: No Bone: No Limited to Skin Limited to Skin Breakdown Breakdown Epithelialization: None None  N/A Periwound Skin Texture: Edema: No Edema: No N/A Excoriation: No Excoriation: No Induration: No Induration: No Callus: No Callus: No Crepitus: No Crepitus: No Fluctuance: No Fluctuance: No Friable: No Friable: No Rash: No Rash: No Scarring: No Scarring: No Periwound Skin Maceration: No Maceration: No N/A Moisture: Moist: No Moist: No Dry/Scaly: No Dry/Scaly: No Periwound Skin Color: Atrophie Blanche: No Atrophie Blanche: No N/A Cyanosis: No Cyanosis: No Ecchymosis: No Ecchymosis: No Erythema: No Erythema: No Hemosiderin Staining: No Hemosiderin Staining: No Mottled: No Mottled: No Pallor: No Pallor: No Rubor: No Rubor: No Tenderness on No No N/A Palpation: Wound Preparation: Ulcer Cleansing: Other: Ulcer Cleansing: Other: N/A soap and water soap and water Topical Anesthetic Topical Anesthetic Applied: Other: lidocaine Applied: Other: lidocaine 4% 4% Treatment Notes Electronic Signature(s) Signed: 04/04/2015 5:39:20 PM By: Curtis Sites Entered By: Curtis Sites on 04/04/2015 11:14:32 HUGO, LYBRAND ([id number]) AHSAN, ESTERLINE ([id number]) -------------------------------------------------------------------------------- Multi-Disciplinary Care Plan Details Patient Name: [name], [name]. Date of Service: 04/04/2015 10:45 AM Medical Record Number: [id number] Patient Account Number: [id number] Date of Birth/Sex: [birth year] (49 y.o. Male) Treating RN: Curtis Sites Primary Care Physician: Other Clinician: Referring Physician: Merwyn Katos Treating Physician/Extender: Rudene Re in Treatment: 5 Active Inactive Abuse / Safety / Falls / Self Care Management Nursing Diagnoses: Potential for falls Goals: Patient will remain injury free Date Initiated: 02/28/2015 Goal Status: Active Interventions: Assess fall risk on admission and as needed Notes: Nutrition Nursing Diagnoses: Impaired glucose control: actual or  potential Goals: Patient/caregiver agrees to and verbalizes understanding of need to obtain nutritional consultation Date Initiated: 02/28/2015 Goal Status: Active Interventions: Assess patient nutrition upon admission and as needed per policy Notes: Orientation to the Wound Care Program Nursing Diagnoses: Knowledge deficit related to the wound healing center program Goals: Patient/caregiver will verbalize understanding of the Wound Healing Center Program Date Initiated: 02/28/2015 AKEEM, HEPPLER ([id number]) Goal Status: Active Interventions: Provide education on orientation to the wound center Notes: Pressure Nursing Diagnoses: Potential for impaired tissue integrity related to pressure, friction, moisture, and shear Goals: Patient will remain free of pressure ulcers Date Initiated: 02/28/2015 Goal Status: Active Interventions: Assess: immobility, friction, shearing, incontinence upon admission and as needed Notes: Wound/Skin Impairment Nursing Diagnoses: Impaired tissue integrity Goals: Ulcer/skin breakdown will heal within 14 weeks Date Initiated: 02/28/2015 Goal Status: Active Interventions: Assess patient/caregiver ability to obtain necessary supplies Notes: Electronic Signature(s) Signed: 04/04/2015 5:39:20 PM By: Curtis Sites Entered By: Curtis Sites on 04/04/2015 11:14:24 [name] ([id number]) -------------------------------------------------------------------------------- Patient/Caregiver Education Details Patient Name: [name]. Date of Service: 04/04/2015 10:45 AM Medical Record Number: [id number] Patient Account Number: [id number] Date of Birth/Gender: 01-Aug-1965 (49 y.o. Male) Treating RN: Curtis Sites Primary Care Physician: Other Clinician: Referring Physician: Merwyn Katos Treating Physician/Extender: Rudene Re in Treatment: 5 Education Assessment Education Provided To:  Patient Education Topics  Provided Wound/Skin Impairment: Handouts: Other: wound care as ordered Methods: Explain/Verbal Responses: State content correctly Electronic Signature(s) Signed: 04/04/2015 5:39:20 PM By: Curtis Sites Entered By: Curtis Sites on 04/04/2015 11:45:55 Weekley, [name] ([id number]) -------------------------------------------------------------------------------- Wound Assessment Details Patient Name: [name]. Date of Service: 04/04/2015 10:45 AM Medical Record Number: [id number] Patient Account Number: [id number] Date of Birth/Sex: November 22, 1965 (49 y.o. Male) Treating RN: Curtis Sites Primary Care Physician: Other Clinician: Referring Physician: Merwyn Katos Treating Physician/Extender: Rudene Re in Treatment: 5 Wound Status Wound Number: 1 Primary Diabetic Wound/Ulcer of the Lower Etiology: Extremity Wound Location: Right Foot - Plantar Secondary Pressure Ulcer Wounding Event: Pressure Injury Etiology: Date Acquired: 02/18/2014 Wound Open Weeks Of Treatment: 5 Status: Clustered Wound: No Comorbid Chronic sinus problems/congestion, History: Anemia, Asthma, Hypertension, Myocardial Infarction, Type II Diabetes, Gout, Rheumatoid Arthritis, Osteoarthritis, Neuropathy, Confinement Anxiety Photos Photo Uploaded By: Curtis Sites on 04/04/2015 16:39:05 Wound Measurements Length: (cm) 1.2 Width: (cm) 1.2 Depth: (cm) 0.5 Area: (cm) 1.131 Volume: (cm) 0.565 % Reduction in Area: -14.2% % Reduction in Volume: 18.5% Epithelialization: None Tunneling: No Undermining: No Wound Description Classification: Grade 2 Wound Margin: Flat and Intact Exudate Amount: Medium Exudate Type: Serous Exudate Color: amber TORRION, GOULDER ([id number]) Foul Odor After Cleansing: No Wound Bed Granulation Amount: Large (67-100%) Exposed Structure Granulation Quality: Red, Pink Fascia Exposed: No Necrotic Amount: Small (1-33%) Fat Layer Exposed: No Necrotic Quality:  Adherent Slough Tendon Exposed: No Muscle Exposed: No Joint Exposed: No Bone Exposed: No Limited to Skin Breakdown Periwound Skin Texture Texture Color No Abnormalities Noted: No No Abnormalities Noted: No Callus: No Atrophie Blanche: No Crepitus: No Cyanosis: No Excoriation: No Ecchymosis: No Fluctuance: No Erythema: No Friable: No Hemosiderin Staining: No Induration: No Mottled: No Localized Edema: No Pallor: No Rash: No Rubor: No Scarring: No Moisture No Abnormalities Noted: No Dry / Scaly: No Maceration: No Moist: No Wound Preparation Ulcer Cleansing: Other: soap and water, Topical Anesthetic Applied: Other: lidocaine 4%, Treatment Notes Wound #1 (Right, Plantar Foot) 1. Cleansed with: Clean wound with Normal Saline 2. Anesthetic Topical Lidocaine 4% cream to wound bed prior to debridement 4. Dressing Applied: Aquacel Ag 5. Secondary Dressing Applied Gauze and Kerlix/Conform 7. Secured with [employer]) ENRI, CHUTE ([id number]) Signed: 04/04/2015 5:39:20 PM By: Curtis Sites Entered By: Curtis Sites on 04/04/2015 11:13:13 HRISTOS, BUBIER ([id number]) -------------------------------------------------------------------------------- Wound Assessment Details Patient Name: [name]. Date of Service: 04/04/2015 10:45 AM Medical Record Number: [id number] Patient Account Number: [id number] Date of Birth/Sex: Apr 30, 1965 (49 y.o. Male) Treating RN: Curtis Sites Primary Care Physician: Other Clinician: Referring Physician: Merwyn Katos Treating Physician/Extender: Rudene Re in Treatment: 5 Wound Status Wound Number: 3 Primary Diabetic Wound/Ulcer of the Lower Etiology: Extremity Wound Location: Left Metatarsal head first - Plantar Secondary Pressure Ulcer Etiology: Wounding Event: Pressure Injury Wound Open Date Acquired: 02/18/2014 Status: Weeks Of Treatment: 5 Comorbid Chronic sinus  problems/congestion, Clustered Wound: No History: Anemia, Asthma, Hypertension, Myocardial Infarction, Type II Diabetes, Gout, Rheumatoid Arthritis, Osteoarthritis, Neuropathy, Confinement Anxiety Photos Photo Uploaded By: Curtis Sites on 04/04/2015 16:39:41 Wound Measurements Length: (cm) 1 Width: (cm) 0.6 Depth: (cm) 0.6 Area: (cm) 0.471 Volume: (cm) 0.283 % Reduction in Area: 45.5% % Reduction in Volume: 18.2% Epithelialization: None Tunneling: No Undermining: No Wound Description Classification: Grade 2 Wound Margin: Flat and Intact Exudate Amount: Large Exudate Type: Serous Exudate Color: amber KIYOTO, BLANKLEY ([id number]) Foul Odor After Cleansing: No Wound Bed Granulation Amount: Large (67-100%) Exposed Structure  Granulation Quality: Red, Pink Fascia Exposed: No Necrotic Amount: Small (1-33%) Fat Layer Exposed: No Necrotic Quality: Eschar, Adherent Slough Tendon Exposed: No Muscle Exposed: No Joint Exposed: No Bone Exposed: No Limited to Skin Breakdown Periwound Skin Texture Texture Color No Abnormalities Noted: No No Abnormalities Noted: No Callus: No Atrophie Blanche: No Crepitus: No Cyanosis: No Excoriation: No Ecchymosis: No Fluctuance: No Erythema: No Friable: No Hemosiderin Staining: No Induration: No Mottled: No Localized Edema: No Pallor: No Rash: No Rubor: No Scarring: No Moisture No Abnormalities Noted: No Dry / Scaly: No Maceration: No Moist: No Wound Preparation Ulcer Cleansing: Other: soap and water, Topical Anesthetic Applied: Other: lidocaine 4%, Treatment Notes Wound #3 (Left, Plantar Metatarsal head first) 1. Cleansed with: Clean wound with Normal Saline 2. Anesthetic Topical Lidocaine 4% cream to wound bed prior to debridement 4. Dressing Applied: Aquacel Ag 5. Secondary Dressing Applied Gauze and Kerlix/Conform 7. Secured with [employer]) EXZAVIER, RUDERMAN ([id number]) Signed:  04/04/2015 5:39:20 PM By: Curtis Sites Entered By: Curtis Sites on 04/04/2015 11:13:50 MARIUS, BETTS ([id number]) -------------------------------------------------------------------------------- Vitals Details Patient Name: [name]. Date of Service: 04/04/2015 10:45 AM Medical Record Number: [id number] Patient Account Number: [id number] Date of Birth/Sex: 08-Oct-1965 (49 y.o. Male) Treating RN: Curtis Sites Primary Care Physician: Other Clinician: Referring Physician: Merwyn Katos Treating Physician/Extender: Rudene Re in Treatment: 5 Vital Signs Time Taken: 10:53 Temperature (F): 98.1 Height (in): 72 Pulse (bpm): 63 Weight (lbs): 228 Respiratory Rate (breaths/min): 18 Body Mass Index (BMI): 30.9 Blood Pressure (mmHg): 127/74 Reference Range: 80 - 120 mg / dl Electronic Signature(s) Signed: 04/04/2015 5:39:20 PM By: Curtis Sites Entered By: Curtis Sites on 04/04/2015 10:53:50

## 2015-04-05 NOTE — Progress Notes (Signed)
LENUS, TRAUGER ([id number]) Visit Report for 04/04/2015 Chief Complaint Document Details Patient Name: [name], [name]. Date of Service: 04/04/2015 10:45 AM Medical Record Number: [id number] Patient Account Number: [id number] Date of Birth/Sex: 02-08-1966 (49 y.o. Male) Treating RN: Curtis Sites Primary Care Physician: Other Clinician: Referring Physician: Merwyn Katos Treating Physician/Extender: Rudene Re in Treatment: 5 Information Obtained from: Patient Chief Complaint Patients presents for treatment of an open diabetic ulcer. He presents with ulcers on both feet which she's had for over a year and a half. He has been seen at the wound care center at [facility] and was a patient of Dr. Leanord Hawking for about 2 months Electronic Signature(s) Signed: 04/04/2015 11:34:12 AM By: Evlyn Kanner MD, FACS Entered By: Evlyn Kanner on 04/04/2015 11:34:12 [name] ([id number]) -------------------------------------------------------------------------------- Debridement Details Patient Name: [name]. Date of Service: 04/04/2015 10:45 AM Medical Record Number: [id number] Patient Account Number: [id number] Date of Birth/Sex: December 07, 1965 (49 y.o. Male) Treating RN: Curtis Sites Primary Care Physician: Other Clinician: Referring Physician: Merwyn Katos Treating Physician/Extender: Rudene Re in Treatment: 5 Debridement Performed for Wound #1 Right,Plantar Foot Assessment: Performed By: Physician Evlyn Kanner, MD Debridement: Debridement Pre-procedure Yes Verification/Time Out Taken: Start Time: 11:24 Pain Control: Lidocaine 4% Topical Solution Level: Skin/Subcutaneous Tissue Total Area Debrided (L x 1.2 (cm) x 1.2 (cm) = 1.44 (cm) W): Tissue and other Viable, Non-Viable, Callus, Eschar, Fibrin/Slough, Subcutaneous material debrided: Instrument: Curette Bleeding: Minimum Hemostasis Achieved: Silver Nitrate End Time: 11:27 Procedural Pain:  0 Post Procedural Pain: 0 Response to Treatment: Procedure was tolerated well Post Debridement Measurements of Total Wound Length: (cm) 1.2 Width: (cm) 1.2 Depth: (cm) 0.5 Volume: (cm) 0.565 Post Procedure Diagnosis Same as Pre-procedure Electronic Signature(s) Signed: 04/04/2015 11:33:56 AM By: Evlyn Kanner MD, FACS Signed: 04/04/2015 5:39:20 PM By: Curtis Sites Entered By: Evlyn Kanner on 04/04/2015 11:33:56 [name] ([id number]) -------------------------------------------------------------------------------- Debridement Details Patient Name: [name]. Date of Service: 04/04/2015 10:45 AM Medical Record Number: [id number] Patient Account Number: [id number] Date of Birth/Sex: 1965/09/08 (49 y.o. Male) Treating RN: Curtis Sites Primary Care Physician: Other Clinician: Referring Physician: Merwyn Katos Treating Physician/Extender: Rudene Re in Treatment: 5 Debridement Performed for Wound #3 Left,Plantar Metatarsal head first Assessment: Performed By: Physician Evlyn Kanner, MD Debridement: Debridement Pre-procedure Yes Verification/Time Out Taken: Start Time: 11:27 Pain Control: Lidocaine 4% Topical Solution Level: Skin/Subcutaneous Tissue Total Area Debrided (L x 1 (cm) x 0.6 (cm) = 0.6 (cm) W): Tissue and other Viable, Non-Viable, Callus, Eschar, Fibrin/Slough, Subcutaneous material debrided: Instrument: Curette Bleeding: Minimum Hemostasis Achieved: Pressure End Time: 11:31 Procedural Pain: 0 Post Procedural Pain: 0 Response to Treatment: Procedure was tolerated well Post Debridement Measurements of Total Wound Length: (cm) 1 Width: (cm) 0.6 Depth: (cm) 0.6 Volume: (cm) 0.283 Post Procedure Diagnosis Same as Pre-procedure Electronic Signature(s) Signed: 04/04/2015 11:34:05 AM By: Evlyn Kanner MD, FACS Signed: 04/04/2015 5:39:20 PM By: Curtis Sites Entered By: Evlyn Kanner on 04/04/2015 11:34:05 [name]  ([id number]) -------------------------------------------------------------------------------- HPI Details Patient Name: William Amy D. Date of Service: 04/04/2015 10:45 AM Medical Record Number: [id number] Patient Account Number: [id number] Date of Birth/Sex: 05-28-1965 (49 y.o. Male) Treating RN: Curtis Sites Primary Care Physician: Other Clinician: Referring Physician: Merwyn Katos Treating Physician/Extender: Rudene Re in Treatment: 5 History of Present Illness Location: ulcers on the plantar aspect of both feet for over a year and a half Quality: Patient reports No Pain. Severity: Patient states wound are getting worse. Duration: Patient has had the wound for >  18 months prior to seeking treatment at the wound center Context: The wound appeared gradually over time Modifying Factors: Other treatment(s) tried include: his podiatrist has been treating him with Regranex and he has been seen at the [facility] wound care center and treated with surgical debridement, antibiotics and total contact cast. Associated Signs and Symptoms: Patient reports having increase discharge. HPI Description: This is a 49 year old man who comes in for review of wounds on his bilateral plantar feet which are been present for one and a half year. he has been seen at the [facility] wound care center by Dr. Leanord Hawking and was treated there for a couple of months and I'm not entirely sure why he changed to this center. He says it was difficult for him to get an appointment due to shortage of staff. The last time around he had a total contact cast which he cut himself because he was unable to go back to [place]. He has severe diabetic neuropathy with lower extremity pain. He has had a wound over his left plantar first metatarsal head and a wound on the right foot at roughly the second and third plantar metatarsal heads. He has a Darco sandal on the left and a healing sandal on the right. For roughly the  last 8 months. He has been using Regranex and this was been followed by his podiatrist at [place] [facility] He has had multiple x-rays of his feet according to the patient that have not shown infection. He has not had an MRI. He was treated with doxycycline and Septra in the past for a staph aureus infection. He has never been a smoker. he goes to a pain clinic for pain medications and is regularly seen for his primary care needs by his PCP. 03/21/2015 -- the patient failed to keep his appointment for a cast change on Monday because of transportation issues. He said by Tuesday he managed to go to the local hospital and get the cast open. He has an abrasion on the dorsum of his right foot and this may have been caused by the cast rubbing on his foot. His blood sugars have been okay. 03/28/2015 -- he agrees to have a TCC applied to his right lower extremity and be again discussed getting a DH walking boot for his left low lower extremity. 04/04/2015 -- the patient says he cannot tolerate the total contact cast as his feet sweat a lot and he has a terrible odor and he just cannot live with his total contact cast. He refuses to have another one applied but this time around agrees that he will buy a DH walking boot Electronic Signature(s) Signed: 04/04/2015 11:34:51 AM By: Evlyn Kanner MD, FACS [name], [name] ([id number]) Entered By: Evlyn Kanner on 04/04/2015 11:34:51 [name] ([id number]) -------------------------------------------------------------------------------- Physical Exam Details Patient Name: [name]. Date of Service: 04/04/2015 10:45 AM Medical Record Number: [id number] Patient Account Number: [id number] Date of Birth/Sex: 12/28/1965 (49 y.o. Male) Treating RN: Curtis Sites Primary Care Physician: Other Clinician: Referring Physician: Merwyn Katos Treating Physician/Extender: Rudene Re in Treatment: 5 Constitutional .  Pulse regular. Respirations normal and unlabored. Afebrile. . Eyes Nonicteric. Reactive to light. Ears, Nose, Mouth, and Throat Lips, teeth, and gums WNL.Marland Kitchen Moist mucosa without lesions . Neck supple and nontender. No palpable supraclavicular or cervical adenopathy. Normal sized without goiter. Respiratory WNL. No retractions.. Cardiovascular Pedal Pulses WNL. No clubbing, cyanosis or edema. Lymphatic No adneopathy. No adenopathy. No adenopathy. Musculoskeletal Adexa without  tenderness or enlargement.. Digits and nails w/o clubbing, cyanosis, infection, petechiae, ischemia, or inflammatory conditions.. Integumentary (Hair, Skin) No suspicious lesions. No crepitus or fluctuance. No peri-wound warmth or erythema. No masses.Marland Kitchen Psychiatric Judgement and insight Intact.. No evidence of depression, anxiety, or agitation.. Notes both feet plantar ulcerations have significant amount of callus and slough and will need sharp debridement with a curette. Electronic Signature(s) Signed: 04/04/2015 11:35:19 AM By: Evlyn Kanner MD, FACS Entered By: Evlyn Kanner on 04/04/2015 11:35:19 [name] ([id number]) -------------------------------------------------------------------------------- Physician Orders Details Patient Name: [name]. Date of Service: 04/04/2015 10:45 AM Medical Record Number: [id number] Patient Account Number: [id number] Date of Birth/Sex: 01-23-1966 (49 y.o. Male) Treating RN: Curtis Sites Primary Care Physician: Other Clinician: Referring Physician: Merwyn Katos Treating Physician/Extender: Rudene Re in Treatment: 5 Verbal / Phone Orders: Yes Clinician: Curtis Sites Read Back and Verified: Yes Diagnosis Coding Wound Cleansing Wound #1 Right,Plantar Foot o Clean wound with Normal Saline. Wound #3 Left,Plantar Metatarsal head first o Clean wound with Normal Saline. Anesthetic Wound #1 Right,Plantar Foot o Topical Lidocaine 4% cream  applied to wound bed prior to debridement Wound #3 Left,Plantar Metatarsal head first o Topical Lidocaine 4% cream applied to wound bed prior to debridement Primary Wound Dressing Wound #1 Right,Plantar Foot o Aquacel Ag Wound #3 Left,Plantar Metatarsal head first o Aquacel Ag Secondary Dressing Wound #1 Right,Plantar Foot o Foam Wound #3 Left,Plantar Metatarsal head first o Gauze, ABD and Kerlix/Conform Dressing Change Frequency Wound #1 Right,Plantar Foot o Change dressing every week Wound #3 Left,Plantar Metatarsal head first o Change dressing every other day. Follow-up Appointments [name], [name] ([id number]) Wound #1 Right,Plantar Foot o Return Appointment in 1 week. Wound #3 Left,Plantar Metatarsal head first o Return Appointment in 1 week. Off-Loading Wound #1 Right,Plantar Foot o Total Contact Cast to Right Lower Extremity Wound #3 Left,Plantar Metatarsal head first o Open toe surgical shoe to: - front off-loader to left Additional Orders / Instructions Wound #1 Right,Plantar Foot o Increase protein intake. Wound #3 Left,Plantar Metatarsal head first o Increase protein intake. Radiology o X-ray, foot - bilateral oooo Electronic Signature(s) Signed: 04/04/2015 4:24:56 PM By: Evlyn Kanner MD, FACS Signed: 04/04/2015 5:39:20 PM By: Curtis Sites Entered By: Curtis Sites on 04/04/2015 11:44:58 [name] ([id number]) -------------------------------------------------------------------------------- Problem List Details Patient Name: [name], POTH. Date of Service: 04/04/2015 10:45 AM Medical Record Number: [id number] Patient Account Number: [id number] Date of Birth/Sex: 1965/11/26 (49 y.o. 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 Date of Service: 04/04/2015 10:45 AM Medical Record Number: [id number] Patient Account Number: [id number] Date of Birth/Sex: November 12, 1965 (49 y.o. Male) Treating RN: Curtis Sites Primary Care Physician: Other Clinician: Referring Physician: Merwyn Katos Treating Physician/Extender: Rudene Re in Treatment: 5 Subjective Chief Complaint Information obtained from Patient Patients presents for treatment of an open  diabetic ulcer. He presents with ulcers on both feet which she's had for over a year and a half. He has been seen at the wound care center at [facility] and was a patient of Dr. Leanord Hawking for about 2 months History of Present Illness (HPI) The following HPI elements were documented for the patient's wound: Location: ulcers on the plantar aspect of both feet for over a year and a half Quality: Patient reports No Pain. Severity: Patient states wound are getting worse. Duration: Patient has had the wound for > 18 months prior to seeking treatment at the wound center Context: The wound appeared gradually over time Modifying Factors: Other treatment(s) tried include: his podiatrist has been treating him with Regranex and he has been seen at the [facility]  wound care center and treated with surgical debridement, antibiotics and total contact cast. Associated Signs and Symptoms: Patient reports having increase discharge. This is a 49 year old man who comes in for review of wounds on his bilateral plantar feet which are been present for one and a half year. he has been seen at the [facility] wound care center by Dr. Leanord Hawking and was treated there for a couple of months and I'm not entirely sure why he changed to this center. He says it was difficult for him to get an appointment due to shortage of staff. The last time around he had a total contact cast which he cut himself because he was unable to go back to [place]. He has severe diabetic neuropathy with lower extremity pain. He has had a wound over his left plantar first metatarsal head and a wound on the right foot at roughly the second and third plantar metatarsal heads. He has a Darco sandal on the left and a healing sandal on the right. For roughly the last 8 months. He has been using Regranex and this was been followed by his podiatrist at [place] [facility]]. He has had multiple x-rays of his feet according to the patient that have not shown infection. He has not had an MRI. He was treated with doxycycline and Septra in the past for a staph aureus infection. He has never been a smoker. he goes to a pain clinic for pain medications and is regularly seen for his primary care needs by his PCP. 03/21/2015 -- the patient failed to keep his appointment for a cast change on Monday because of transportation issues. He said by Tuesday he managed to go to the local hospital and get the cast open. He has an abrasion on the dorsum of his right foot and this may have been caused by the cast rubbing on his [name], [name] ([id number]) foot. His blood sugars have been okay. 03/28/2015 -- he agrees to have a TCC applied to his right lower extremity and be again discussed getting  a DH walking boot for his left low lower extremity. 04/04/2015 -- the patient says he cannot tolerate the total contact cast as his feet sweat a lot and he has a terrible odor and he just cannot live with his total contact cast. He refuses to have another one applied but this time around agrees that he will buy a DH walking boot Objective Constitutional Pulse regular. Respirations normal and unlabored. Afebrile. Vitals Time Taken: 10:53 AM, Height: 72 in, Weight: 228 lbs, BMI: 30.9, Temperature: 98.1 F, Pulse: 63 bpm, Respiratory Rate: 18 breaths/min, Blood Pressure: 127/74 mmHg. Eyes Nonicteric. Reactive to light. Ears, Nose, Mouth, and Throat Lips, teeth, and  gums WNL.Marland Kitchen Moist mucosa without lesions . Neck supple and nontender. No palpable supraclavicular or cervical adenopathy. Normal sized without goiter. Respiratory WNL. No retractions.. Cardiovascular Pedal Pulses WNL. No clubbing, cyanosis or edema. Lymphatic No adneopathy. No adenopathy. No adenopathy. Musculoskeletal Adexa without tenderness or enlargement.. Digits and nails w/o clubbing, cyanosis, infection, petechiae, ischemia, or inflammatory conditions.Marland Kitchen Psychiatric Judgement and insight Intact.. No evidence of depression, anxiety, or agitation.. General Notes: both feet plantar ulcerations have significant amount of callus and slough and will need sharp debridement with a curette. [name], [name] ([id number]) Integumentary (Hair, Skin) No suspicious lesions. No crepitus or fluctuance. No peri-wound warmth or erythema. No masses.. Wound #1 status is Open. Original cause of wound was Pressure Injury. The wound is located on the Right,Plantar Foot. The wound measures 1.2cm length x 1.2cm width x 0.5cm depth; 1.131cm^2 area and 0.565cm^3 volume. The wound is limited to skin breakdown. There is no tunneling or undermining noted. There is a medium amount of serous drainage noted. The wound margin is flat and intact. There  is large (67-100%) red, pink granulation within the wound bed. There is a small (1-33%) amount of necrotic tissue within the wound bed including Adherent Slough. The periwound skin appearance did not exhibit: Callus, Crepitus, Excoriation, Fluctuance, Friable, Induration, Localized Edema, Rash, Scarring, Dry/Scaly, Maceration, Moist, Atrophie Blanche, Cyanosis, Ecchymosis, Hemosiderin Staining, Mottled, Pallor, Rubor, Erythema. Wound #3 status is Open. Original cause of wound was Pressure Injury. The wound is located on the Left,Plantar Metatarsal head first. The wound measures 1cm length x 0.6cm width x 0.6cm depth; 0.471cm^2 area and 0.283cm^3 volume. The wound is limited to skin breakdown. There is no tunneling or undermining noted. There is a large amount of serous drainage noted. The wound margin is flat and intact. There is large (67-100%) red, pink granulation within the wound bed. There is a small (1-33%) amount of necrotic tissue within the wound bed including Eschar and Adherent Slough. The periwound skin appearance did not exhibit: Callus, Crepitus, Excoriation, Fluctuance, Friable, Induration, Localized Edema, Rash, Scarring, Dry/Scaly, Maceration, Moist, Atrophie Blanche, Cyanosis, Ecchymosis, Hemosiderin Staining, Mottled, Pallor, Rubor, Erythema. Assessment Active Problems ICD-10 E11.621 - Type 2 diabetes mellitus with foot ulcer E11.42 - Type 2 diabetes mellitus with diabetic polyneuropathy L97.512 - Non-pressure chronic ulcer of other part of right foot with fat layer exposed L97.522 - Non-pressure chronic ulcer of other part of left foot with fat layer exposed L84 - Corns and callosities I have recommended that the use Aquacel Ag to be packed into both these wounds and apply some Drawtext as he has a lot of drainage and some felt off loading. He has been using a Darco front off loading shoe on the left. He will have dressings changed every other day on the left foot. On the  right side he we will have the same dressing application and he will use a DH walking boot which I have given him a prescription for today. He will be back to see as next week. [name], [name] ([id number]) Procedures Wound #1 Wound #1 is a Diabetic Wound/Ulcer of the Lower Extremity located on the Right,Plantar Foot . There was a Skin/Subcutaneous Tissue Debridement ([id number]) debridement with total area of 1.44 sq cm performed by Evlyn Kanner, MD. with the following instrument(s): Curette to remove Viable and Non-Viable tissue/material including Fibrin/Slough, Eschar, Callus, and Subcutaneous after achieving pain control using Lidocaine 4% Topical Solution. A time out was conducted prior to the start of the procedure. A Minimum amount  of bleeding was controlled with Silver Nitrate. The procedure was tolerated well with a pain level of 0 throughout and a pain level of 0 following the procedure. Post Debridement Measurements: 1.2cm length x 1.2cm width x 0.5cm depth; 0.565cm^3 volume. Post procedure Diagnosis Wound #1: Same as Pre-Procedure Wound #3 Wound #3 is a Diabetic Wound/Ulcer of the Lower Extremity located on the Left,Plantar Metatarsal head first . There was a Skin/Subcutaneous Tissue Debridement ([id number]) debridement with total area of 0.6 sq cm performed by Evlyn Kanner, MD. with the following instrument(s): Curette to remove Viable and Non-Viable tissue/material including Fibrin/Slough, Eschar, Callus, and Subcutaneous after achieving pain control using Lidocaine 4% Topical Solution. A time out was conducted prior to the start of the procedure. A Minimum amount of bleeding was controlled with Pressure. The procedure was tolerated well with a pain level of 0 throughout and a pain level of 0 following the procedure. Post Debridement Measurements: 1cm length x 0.6cm width x 0.6cm depth; 0.283cm^3 volume. Post procedure Diagnosis Wound #3: Same as Pre-Procedure Plan Wound  Cleansing: Wound #1 Right,Plantar Foot: Clean wound with Normal Saline. Wound #3 Left,Plantar Metatarsal head first: Clean wound with Normal Saline. Anesthetic: Wound #1 Right,Plantar Foot: Topical Lidocaine 4% cream applied to wound bed prior to debridement Wound #3 Left,Plantar Metatarsal head first: Topical Lidocaine 4% cream applied to wound bed prior to debridement Primary Wound Dressing: Wound #1 Right,Plantar Foot: Aquacel Ag Wound #3 Left,Plantar Metatarsal head first: Aquacel Ag Secondary Dressing: Wound #1 Right,Plantar Foot: [name], [name] ([id number]) Foam Wound #3 Left,Plantar Metatarsal head first: Gauze, ABD and Kerlix/Conform Dressing Change Frequency: Wound #1 Right,Plantar Foot: Change dressing every week Wound #3 Left,Plantar Metatarsal head first: Change dressing every other day. Follow-up Appointments: Wound #1 Right,Plantar Foot: Return Appointment in 1 week. Wound #3 Left,Plantar Metatarsal head first: Return Appointment in 1 week. Off-Loading: Wound #1 Right,Plantar Foot: Total Contact Cast to Right Lower Extremity Wound #3 Left,Plantar Metatarsal head first: Open toe surgical shoe to: - front off-loader to left Additional Orders / Instructions: Wound #1 Right,Plantar Foot: Increase protein intake. Wound #3 Left,Plantar Metatarsal head first: Increase protein intake. Radiology ordered were: X-ray, foot - bilateral I have recommended that the use Aquacel Ag to be packed into both these wounds and apply some Drawtext as he has a lot of drainage and some felt off loading. He has been using a Darco front off loading shoe on the left. He will have dressings changed every other day on the left foot. On the right side he we will have the same dressing application and he will use a DH walking boot which I have given him a prescription for today. He will be back to see as next week. Electronic Signature(s) Signed: 04/04/2015 4:27:01 PM By: Evlyn Kanner MD, FACS Previous Signature: 04/04/2015 11:36:10 AM Version By: Evlyn Kanner MD, FACS Entered By: Evlyn Kanner on 04/04/2015 16:27:01 [name] ([id number]) -------------------------------------------------------------------------------- SuperBill Details Patient Name: [name]. Date of Service: 04/04/2015 Medical Record Number: [id number] Patient Account Number: [id number] Date of Birth/Sex: November 19, 1965 (49 y.o. 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.522 Non-pressure chronic ulcer of other part of left foot with fat layer exposed L84 Corns  and callosities Facility Procedures CPT4 Code Description: [id number] [phone] - DEB SUBQ TISSUE 20 SQ CM/< ICD-10 Description Diagnosis E11.621 Type 2 diabetes mellitus with foot ulcer 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

## 2015-04-11 ENCOUNTER — Encounter: Payer: Medicaid Other | Admitting: Surgery

## 2015-04-11 DIAGNOSIS — E11621 Type 2 diabetes mellitus with foot ulcer: Secondary | ICD-10-CM | POA: Diagnosis not present

## 2015-04-11 NOTE — Progress Notes (Addendum)
[name], [name] ([id number]) Visit Report for 04/11/2015 Chief Complaint Document Details Patient Name: [name], [name]. Date of Service: 04/11/2015 9:15 AM Medical Record Number: [id number] Patient Account Number: [id number] Date of Birth/Sex: 1966-03-31 (49 y.o. Male) Treating RN: Curtis Sites Primary Care Physician: Other Clinician: Referring Physician: Merwyn Katos Treating Physician/Extender: Rudene Re in Treatment: 6 Information Obtained from: Patient Chief Complaint Patients presents for treatment of an open diabetic ulcer. He presents with ulcers on both feet which she's had for over a year and a half. He has been seen at the wound care center at [facility] and was a patient of Dr. Leanord Hawking for about 2 months Electronic Signature(s) Signed: 04/11/2015 9:53:22 AM By: Evlyn Kanner MD, FACS Entered By: Evlyn Kanner on 04/11/2015 09:53:22 [name] ([id number]) -------------------------------------------------------------------------------- HPI Details Patient Name: [name]. Date of Service: 04/11/2015 9:15 AM Medical Record Number: [id number] Patient Account Number: [id number] Date of Birth/Sex: 11/17/1965 (49 y.o. Male) Treating RN: Curtis Sites Primary Care Physician: Other Clinician: Referring Physician: Merwyn Katos Treating Physician/Extender: Rudene Re in Treatment: 6 History of Present Illness Location: ulcers on the plantar aspect of both feet for over a year and a half Quality: Patient reports No Pain. Severity: Patient states wound are getting worse. Duration: Patient has had the wound for > 18 months prior to seeking treatment at the wound center Context: The wound appeared gradually over time Modifying Factors: Other treatment(s) tried include: his podiatrist has been treating him with Regranex and he has been seen at the [facility] wound care center and treated with surgical debridement, antibiotics and total contact  cast. Associated Signs and Symptoms: Patient reports having increase discharge. HPI Description: This is a 49 year old man who comes in for review of wounds on his bilateral plantar feet which are been present for one and a half year. he has been seen at the [facility] wound care center by Dr. Leanord Hawking and was treated there for a couple of months and I'm not entirely sure why he changed to this center. He says it was difficult for him to get an appointment due to shortage of staff. The last time around he had a total contact cast which he cut himself because he was unable to go back to [place]. He has severe diabetic neuropathy with lower extremity pain. He has had a wound over his left plantar first metatarsal head and a wound on the right foot at roughly the second and third plantar metatarsal heads. He has a Darco sandal on the left and a healing sandal on the right. For roughly the last 8 months. He has been using Regranex and this was been followed by his podiatrist at [place] [facility] He has had multiple x-rays of his feet according to the patient that have not shown infection. He has not had an MRI. He was treated with doxycycline and Septra in the past for a staph aureus infection. He has never been a smoker. he goes to a pain clinic for pain medications and is regularly seen for his primary care needs by his PCP. 03/21/2015 -- the patient failed to keep his appointment for a cast change on Monday because of transportation issues. He said by Tuesday he managed to go to the local hospital and get the cast open. He has an abrasion on the dorsum of his right foot and this may have been caused by the cast rubbing on his foot. His blood sugars have been okay. 03/28/2015 --  he agrees to have a TCC applied to his right lower extremity and be again discussed getting a DH walking boot for his left low lower extremity. 04/04/2015 -- the patient says he cannot tolerate  the total contact cast as his feet sweat a lot and he has a terrible odor and he just cannot live with his total contact cast. He refuses to have another one applied but this time around agrees that he will buy a DH walking boot Electronic Signature(s) Signed: 04/11/2015 9:53:28 AM By: Evlyn Kanner MD, FACS [name], [name] ([id number]) Entered By: Evlyn Kanner on 04/11/2015 09:53:28 [name] ([id number]) -------------------------------------------------------------------------------- Physical Exam Details Patient Name: [name]. Date of Service: 04/11/2015 9:15 AM Medical Record Number: [id number] Patient Account Number: [id number] Date of Birth/Sex: 1965-09-25 (49 y.o. Male) Treating RN: Curtis Sites Primary Care Physician: Other Clinician: Referring Physician: Merwyn Katos Treating Physician/Extender: Rudene Re in Treatment: 6 Constitutional . Pulse regular. Respirations normal and unlabored. Afebrile. . Eyes Nonicteric. Reactive to light. Ears, Nose, Mouth, and Throat Lips, teeth, and gums WNL.Marland Kitchen Moist mucosa without lesions. Neck supple and nontender. No palpable supraclavicular or cervical adenopathy. Normal sized without goiter. Respiratory WNL. No retractions.. Cardiovascular Pedal Pulses WNL. No clubbing, cyanosis or edema. Lymphatic No adneopathy. No adenopathy. No adenopathy. Musculoskeletal Adexa without tenderness or enlargement.. Digits and nails w/o clubbing, cyanosis, infection, petechiae, ischemia, or inflammatory conditions.. Integumentary (Hair, Skin) No suspicious lesions. No crepitus or fluctuance. No peri-wound warmth or erythema. No masses.Marland Kitchen Psychiatric Judgement and insight Intact.. No evidence of depression, anxiety, or agitation.. Notes the plantar ulcerations on both feet have less callus today and the base of the ulcers are fairly clean and there is minimal undermining on the left foot. No sharp debridement was required  today. Electronic Signature(s) Signed: 04/11/2015 9:54:14 AM By: Evlyn Kanner MD, FACS Entered By: Evlyn Kanner on 04/11/2015 09:54:13 [name] ([id number]) -------------------------------------------------------------------------------- Physician Orders Details Patient Name: [name]. Date of Service: 04/11/2015 9:15 AM Medical Record Number: [id number] Patient Account Number: [id number] Date of Birth/Sex: 11/14/1965 (49 y.o. Male) Treating RN: Curtis Sites Primary Care Physician: Other Clinician: Referring Physician: Merwyn Katos Treating Physician/Extender: Rudene Re in Treatment: 6 Verbal / Phone Orders: Yes Clinician: Curtis Sites Read Back and Verified: Yes Diagnosis Coding Wound Cleansing Wound #1 Right,Plantar Foot o Clean wound with Normal Saline. Wound #3 Left,Plantar Metatarsal head first o Clean wound with Normal Saline. Anesthetic Wound #1 Right,Plantar Foot o Topical Lidocaine 4% cream applied to wound bed prior to debridement Wound #3 Left,Plantar Metatarsal head first o Topical Lidocaine 4% cream applied to wound bed prior to debridement Primary Wound Dressing Wound #1 Right,Plantar Foot o Aquacel Ag Wound #3 Left,Plantar Metatarsal head first o Aquacel Ag Secondary Dressing Wound #1 Right,Plantar Foot o Gauze, ABD and Kerlix/Conform Wound #3 Left,Plantar Metatarsal head first o Gauze, ABD and Kerlix/Conform o Gauze, ABD and Kerlix/Conform Dressing Change Frequency Wound #1 Right,Plantar Foot o Change dressing every other day. Wound #3 Left,Plantar Metatarsal head first o Change dressing every other day. [name], [name] ([id number]) Follow-up Appointments Wound #1 Right,Plantar Foot o Return Appointment in 1 week. Wound #3 Left,Plantar Metatarsal head first o Return Appointment in 1 week. Off-Loading Wound #3 Left,Plantar Metatarsal head first o Other: - Darco front offoader o Open toe  surgical shoe to: - Darco with peg assist Additional Orders / Instructions Wound #1 Right,Plantar Foot o Increase protein intake. Wound #3 Left,Plantar Metatarsal head first o Increase protein intake. Electronic Signature(s)  Signed: 04/11/2015 5:05:23 PM By: Evlyn Kanner MD, FACS Signed: 04/11/2015 5:45:44 PM By: Curtis Sites Entered By: Curtis Sites on 04/11/2015 09:54:22 [name], [name] ([id number]) -------------------------------------------------------------------------------- Problem List Details Patient Name: [name], [name]. Date of Service: 04/11/2015 9:15 AM Medical Record Number: [id number] Patient Account Number: [id number] Date of Birth/Sex: 08/06/1965 (49 y.o. Male) Treating RN: Curtis Sites Primary Care Physician: Other Clinician: Referring Physician: Merwyn Katos Treating Physician/Extender: Rudene Re in Treatment: 6 Active Problems ICD-10 Encounter Code Description Active Date Diagnosis E11.621 Type 2 diabetes mellitus with foot ulcer 02/28/2015 Yes E11.42 Type 2 diabetes mellitus with diabetic polyneuropathy 02/28/2015 Yes L97.512 Non-pressure chronic ulcer of other part of right foot with 02/28/2015 Yes fat layer exposed L97.522 Non-pressure chronic ulcer of other part of left foot with fat 02/28/2015 Yes layer exposed L84 Corns and callosities 02/28/2015 Yes Inactive Problems Resolved Problems Electronic Signature(s) Signed: 04/11/2015 9:53:16 AM By: Evlyn Kanner MD, FACS Entered By: Evlyn Kanner on 04/11/2015 09:53:16 [name] ([id number]) -------------------------------------------------------------------------------- Progress Note Details Patient Name: William Amy D. Date of Service: 04/11/2015 9:15 AM Medical Record Number: [id number] Patient Account Number: [id number] Date of Birth/Sex: [birth year] (49 y.o. Male) Treating RN: Curtis Sites Primary Care Physician: Other Clinician: Referring Physician: Merwyn Katos Treating Physician/Extender: Rudene Re in Treatment: 6 Subjective Chief Complaint Information obtained from Patient Patients presents for treatment of an open diabetic ulcer. He presents with ulcers on both feet which she's had for over a year and a half. He has been seen at the wound care center at [facility] and was a patient of Dr. Leanord Hawking for about 2 months History of Present Illness (HPI) The following HPI elements were documented for the patient's wound: Location: ulcers on the plantar aspect of both feet for over a year and a half Quality: Patient reports No Pain. Severity: Patient states wound are getting worse. Duration: Patient has had the wound for > 18 months prior to seeking treatment at the wound center Context: The wound appeared gradually over time Modifying Factors: Other treatment(s) tried include: his podiatrist has been treating him with Regranex and he has been seen at the [facility] wound care center and treated with surgical debridement, antibiotics and total contact cast. Associated Signs and Symptoms: Patient reports having increase discharge. This is a 49 year old man who comes in for review of wounds on his bilateral plantar feet which are been present for one and a half year. he has been seen at the [facility] wound care center by Dr. Leanord Hawking and was treated there for a couple of months and I'm not entirely sure why he changed to this center. He says it was difficult for him to get an appointment due to shortage of staff. The last time around he had a total contact cast which he cut himself because he was unable to go back to [place]. He has severe diabetic neuropathy with lower extremity pain. He has had a wound over his left plantar first metatarsal head and a wound on the right foot at roughly the second and third plantar metatarsal heads. He has a Darco sandal on the left and a healing sandal on the right. For roughly the last 8 months. He  has been using Regranex and this was been followed by his podiatrist at [place] [facility] He has had multiple x-rays of his feet according to the patient that have not shown infection. He has not had an MRI. He was treated with doxycycline and Septra in  the past for a staph aureus infection. He has never been a smoker. he goes to a pain clinic for pain medications and is regularly seen for his primary care needs by his PCP. 03/21/2015 -- the patient failed to keep his appointment for a cast change on Monday because of transportation issues. He said by Tuesday he managed to go to the local hospital and get the cast open. He has an abrasion on the dorsum of his right foot and this may have been caused by the cast rubbing on his [name], [name] ([id number]) foot. His blood sugars have been okay. 03/28/2015 -- he agrees to have a TCC applied to his right lower extremity and be again discussed getting a DH walking boot for his left low lower extremity. 04/04/2015 -- the patient says he cannot tolerate the total contact cast as his feet sweat a lot and he has a terrible odor and he just cannot live with his total contact cast. He refuses to have another one applied but this time around agrees that he will buy a DH walking boot Objective Constitutional Pulse regular. Respirations normal and unlabored. Afebrile. Vitals Time Taken: 9:34 AM, Height: 72 in, Weight: 228 lbs, BMI: 30.9, Temperature: 98.3 F, Pulse: 72 bpm, Respiratory Rate: 18 breaths/min, Blood Pressure: 130/69 mmHg. Eyes Nonicteric. Reactive to light. Ears, Nose, Mouth, and Throat Lips, teeth, and gums WNL.Marland Kitchen Moist mucosa without lesions. Neck supple and nontender. No palpable supraclavicular or cervical adenopathy. Normal sized without goiter. Respiratory WNL. No retractions.. Cardiovascular Pedal Pulses WNL. No clubbing, cyanosis or edema. Lymphatic No adneopathy. No adenopathy. No  adenopathy. Musculoskeletal Adexa without tenderness or enlargement.. Digits and nails w/o clubbing, cyanosis, infection, petechiae, ischemia, or inflammatory conditions.Marland Kitchen Psychiatric Judgement and insight Intact.. No evidence of depression, anxiety, or agitation.. General Notes: the plantar ulcerations on both feet have less callus today and the base of the ulcers are fairly clean and there is minimal undermining on the left foot. No sharp debridement was required today. [name], [name] ([id number]) Integumentary (Hair, Skin) No suspicious lesions. No crepitus or fluctuance. No peri-wound warmth or erythema. No masses.. Wound #1 status is Open. Original cause of wound was Pressure Injury. The wound is located on the Right,Plantar Foot. The wound measures 0.8cm length x 0.9cm width x 0.4cm depth; 0.565cm^2 area and 0.226cm^3 volume. The wound is limited to skin breakdown. There is no tunneling or undermining noted. There is a medium amount of serous drainage noted. The wound margin is flat and intact. There is large (67-100%) red, pink granulation within the wound bed. There is a small (1-33%) amount of necrotic tissue within the wound bed including Adherent Slough. The periwound skin appearance did not exhibit: Callus, Crepitus, Excoriation, Fluctuance, Friable, Induration, Localized Edema, Rash, Scarring, Dry/Scaly, Maceration, Moist, Atrophie Blanche, Cyanosis, Ecchymosis, Hemosiderin Staining, Mottled, Pallor, Rubor, Erythema. The periwound has tenderness on palpation. Wound #3 status is Open. Original cause of wound was Pressure Injury. The wound is located on the Left,Plantar Metatarsal head first. The wound measures 0.8cm length x 0.5cm width x 0.5cm depth; 0.314cm^2 area and 0.157cm^3 volume. The wound is limited to skin breakdown. There is no tunneling or undermining noted. There is a large amount of serous drainage noted. The wound margin is flat and intact. There is large  (67-100%) red, pink granulation within the wound bed. There is a small (1-33%) amount of necrotic tissue within the wound bed including Eschar and Adherent Slough. The periwound skin appearance did not exhibit: Callus, Crepitus,  Excoriation, Fluctuance, Friable, Induration, Localized Edema, Rash, Scarring, Dry/Scaly, Maceration, Moist, Atrophie Blanche, Cyanosis, Ecchymosis, Hemosiderin Staining, Mottled, Pallor, Rubor, Erythema. The periwound has tenderness on palpation. Assessment Active Problems ICD-10 E11.621 - Type 2 diabetes mellitus with foot ulcer E11.42 - Type 2 diabetes mellitus with diabetic polyneuropathy L97.512 - Non-pressure chronic ulcer of other part of right foot with fat layer exposed L97.522 - Non-pressure chronic ulcer of other part of left foot with fat layer exposed L84 - Corns and callosities I have recommended that the use Aquacel Ag to be packed into both these wounds and apply some Drawtext as he has a lot of drainage and some felt off loading. He has been using a Darco front off loading shoe on the left. He will have dressings changed every other day on the left foot. On the right side he we will have the same dressing application and he will use a DH walking boot which I have given him a prescription for last week but he still has not obtained the boot. He will be back to see as next week. Plan [name], [name] ([id number]) I have recommended that the use Aquacel Ag to be packed into both these wounds and apply some Drawtext as he has a lot of drainage and some felt off loading. He has been using a Darco front off loading shoe on the left. He will have dressings changed every other day on the left foot. On the right side he we will have the same dressing application and he will use a DH walking boot which I have given him a prescription for last week but he still has not obtained the boot. He will be back to see as next week. Electronic Signature(s) Signed:  04/11/2015 9:55:20 AM By: Evlyn Kanner MD, FACS Entered By: Evlyn Kanner on 04/11/2015 09:55:20 [name] ([id number]) -------------------------------------------------------------------------------- SuperBill Details Patient Name: [name]. Date of Service: 04/11/2015 Medical Record Number: [id number] Patient Account Number: [id number] Date of Birth/Sex: [birth year] (49 y.o. 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.522 Non-pressure chronic ulcer of other part of left foot with fat layer exposed L84 Corns and callosities Facility Procedures CPT4 Code: [id number] Description: 99213 - WOUND CARE VISIT-LEV 3 EST PT Modifier: Quantity: 1 Physician Procedures CPT4 Code Description: [id number] 99213 - WC PHYS LEVEL 3 - EST PT ICD-10 Description Diagnosis E11.621 Type 2 diabetes mellitus with foot ulcer 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

## 2015-04-12 NOTE — Progress Notes (Signed)
OLIN, GURSKI ([id number]) Visit Report for 04/11/2015 Arrival Information Details Patient Name: [name], [name]. Date of Service: 04/11/2015 9:15 AM Medical Record Number: [id number] Patient Account Number: [id number] Date of Birth/Sex: 08-Apr-1966 (49 y.o. Male) Treating RN: Curtis Sites Primary Care Physician: Other Clinician: Referring Physician: Merwyn Katos Treating Physician/Extender: Rudene Re in Treatment: 6 Visit Information History Since Last Visit Added or deleted any medications: No Patient Arrived: [place] Any new allergies or adverse reactions: No Arrival Time: 09:32 Had a fall or experienced change in No Accompanied By: self activities of daily living that may affect Transfer Assistance: None risk of falls: Patient Identification Verified: Yes Signs or symptoms of abuse/neglect since last No Secondary Verification Process Yes visito Completed: Hospitalized since last visit: No Patient Has Alerts: Yes Pain Present Now: No Patient Alerts: Type II Diabetic Electronic Signature(s) Signed: 04/11/2015 5:45:44 PM By: Curtis Sites Entered By: Curtis Sites on 04/11/2015 09:32:31 Akhtar, [name] ([id number]) -------------------------------------------------------------------------------- Clinic Level of Care Assessment Details Patient Name: [name]. Date of Service: 04/11/2015 9:15 AM Medical Record Number: [id number] Patient Account Number: [id number] Date of Birth/Sex: April 15, 1966 (49 y.o. Male) Treating RN: Curtis Sites Primary Care Physician: Other Clinician: Referring Physician: Merwyn Katos Treating Physician/Extender: Rudene Re in Treatment: 6 Clinic Level of Care Assessment Items TOOL 4 Quantity Score []  - Use when only an EandM is performed on FOLLOW-UP visit 0 ASSESSMENTS - Nursing Assessment / Reassessment X - Reassessment of Co-morbidities (includes updates in patient status) 1 10 X - Reassessment of Adherence to  Treatment Plan 1 5 ASSESSMENTS - Wound and Skin Assessment / Reassessment []  - Simple Wound Assessment / Reassessment - one wound 0 X - Complex Wound Assessment / Reassessment - multiple wounds 2 5 []  - Dermatologic / Skin Assessment (not related to wound area) 0 ASSESSMENTS - Focused Assessment []  - Circumferential Edema Measurements - multi extremities 0 []  - Nutritional Assessment / Counseling / Intervention 0 X - Lower Extremity Assessment (monofilament, tuning fork, pulses) 1 5 []  - Peripheral Arterial Disease Assessment (using hand held doppler) 0 ASSESSMENTS - Ostomy and/or Continence Assessment and Care []  - Incontinence Assessment and Management 0 []  - Ostomy Care Assessment and Management (repouching, etc.) 0 PROCESS - Coordination of Care X - Simple Patient / Family Education for ongoing care 1 15 []  - Complex (extensive) Patient / Family Education for ongoing care 0 []  - Staff obtains , Records, Test Results / Process Orders 0 []  - Staff telephones HHA, Nursing Homes / Clarify orders / etc 0 []  - Routine Transfer to another Facility (non-emergent condition) 0 ALDAN, CAMEY ( ) []  - Routine Hospital Admission (non-emergent condition) 0 []  - New Admissions / / Ordering NPWT, Apligraf, etc. 0 []  - Emergency Hospital Admission (emergent condition) 0 X - Simple Discharge Coordination 1 10 []  - Complex (extensive) Discharge Coordination 0 PROCESS - Special Needs []  - Pediatric / Minor Patient Management 0 []  - Isolation Patient Management 0 []  - Hearing / Language / Visual special needs 0 []  - Assessment of Community assistance (transportation, D/C planning, etc.) 0 []  - Additional assistance / Altered mentation 0 []  - Support Surface(s) Assessment (bed, cushion, seat, etc.) 0 INTERVENTIONS - Wound Cleansing / Measurement []  - Simple Wound Cleansing - one wound 0 X - Complex Wound Cleansing - multiple wounds 2 5 X - Wound Imaging  (photographs - any number of wounds) 1 5 []  - Wound Tracing (instead of photographs) 0 []  - Simple Wound Measurement -  one wound 0 X - Complex Wound Measurement - multiple wounds 2 5 INTERVENTIONS - Wound Dressings X - Small Wound Dressing one or multiple wounds 2 10 []  - Medium Wound Dressing one or multiple wounds 0 []  - Large Wound Dressing one or multiple wounds 0 []  - Application of Medications - topical 0 []  - Application of Medications - injection 0 INTERVENTIONS - Miscellaneous []  - External ear exam 0 BRAXSON, BRANDSMA ([id number]) []  - Specimen Collection (cultures, biopsies, blood, body fluids, etc.) 0 []  - Specimen(s) / Culture(s) sent or taken to Lab for analysis 0 []  - Patient Transfer (multiple staff / Michiel Sites Lift / Similar devices) 0 []  - Simple Staple / Suture removal (25 or less) 0 []  - Complex Staple / Suture removal (26 or more) 0 []  - Hypo / Hyperglycemic Management (close monitor of Blood Glucose) 0 []  - Ankle / Brachial Index (ABI) - do not check if billed separately 0 X - Vital Signs 1 5 Has the patient been seen at the hospital within the last three years: Yes Total Score: 105 Level Of Care: New/Established - Level 3 Electronic Signature(s) Signed: 04/11/2015 12:03:10 PM By: Curtis Sites Entered By: Curtis Sites on 04/11/2015 12:03:09 [name] ([id number]) -------------------------------------------------------------------------------- Encounter Discharge Information Details Patient Name: [name]. Date of Service: 04/11/2015 9:15 AM Medical Record Number: [id number] Patient Account Number: [id number] Date of Birth/Sex: 01-09-1966 (49 y.o. Male) Treating RN: Curtis Sites Primary Care Physician: Other Clinician: Referring Physician: Merwyn Katos Treating Physician/Extender: Rudene Re in Treatment: 6 Encounter Discharge Information Items Discharge Pain Level: 0 Discharge Condition: Stable Ambulatory Status: Cane Discharge  Destination: Home Transportation: Private Auto Accompanied By: self Schedule Follow-up Appointment: Yes Medication Reconciliation completed and provided to Patient/Care No Alycia Cooperwood: Provided on Clinical Summary of Care: 04/11/2015 Form Type Recipient Paper Patient [facility] Electronic Signature(s) Signed: 04/11/2015 12:04:37 PM By: Curtis Sites Previous Signature: 04/11/2015 10:08:37 AM Version By: Gwenlyn Perking Entered By: Curtis Sites on 04/11/2015 12:04:36 Lumbert, [name] ([id number]) -------------------------------------------------------------------------------- Lower Extremity Assessment Details Patient Name: [name]. Date of Service: 04/11/2015 9:15 AM Medical Record Number: [id number] Patient Account Number: [id number] Date of Birth/Sex: 10-19-1965 (49 y.o. Male) Treating RN: Curtis Sites Primary Care Physician: Other Clinician: Referring Physician: Merwyn Katos Treating Physician/Extender: Rudene Re in Treatment: 6 Vascular Assessment Pulses: Posterior Tibial Dorsalis Pedis Palpable: [Left:Yes] [Right:Yes] Extremity colors, hair growth, and conditions: Extremity Color: [Left:Normal] [Right:Normal] Hair Growth on Extremity: [Left:Yes] [Right:Yes] Temperature of Extremity: [Left:Warm] [Right:Warm] Capillary Refill: [Left:< 3 seconds] [Right:< 3 seconds] Toe Nail Assessment Left: Right: Thick: No No Discolored: No No Deformed: No No Improper Length and Hygiene: No No Electronic Signature(s) Signed: 04/11/2015 5:45:44 PM By: Curtis Sites Entered By: Curtis Sites on 04/11/2015 09:42:27 Vaneaton, [name] ([id number]) -------------------------------------------------------------------------------- Multi Wound Chart Details Patient Name: [name] Date of Service: 04/11/2015 9:15 AM Medical Record Number: [id number] Patient Account Number: [id number] Date of Birth/Sex: 05/25/1965 (49 y.o. 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] [3:the Lower Extremity] 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 Ulcer] [3:Pressure Ulcer] [N/A:N/A] Comorbid History: [  1:Chronic sinus problems/congestion, Anemia, Asthma, Hypertension, Myocardial Hypertension, Myocardial Infarction, Type II Diabetes, Gout, Rheumatoid Arthritis, Osteoarthritis, Neuropathy, Confinement Neuropathy, Confinement Anxiety]  [3:Chronic sinus problems/congestion, Anemia, Asthma, Infarction, Type II Diabetes, Gout, Rheumatoid Arthritis, Osteoarthritis, Anxiety] [N/A:N/A] Date Acquired: [1:02/18/2014] [3:02/18/2014] [N/A:N/A] Weeks of Treatment: [1:6] [3:6] [N/A:N/A] Wound Status: [1:Open] [3:Open] [N/A:N/A] Measurements L x W x D 0.8x0.9x0.4 [3:0.8x0.5x0.5] [N/A:N/A] (cm) Area (cm) : [1:0.565] [3:0.314] [N/A:N/A] Volume (cm) : [1:0.226] [3:0.157] [N/A:N/A] % Reduction in Area: [1:42.90%] [3:63.70%] [N/A:N/A] % Reduction in Volume: 67.40% [3:54.60%] [N/A:N/A] Classification: [1:Grade 2] [3:Grade 2] [N/A:N/A] Exudate Amount: [1:Medium] [3:Large] [N/A:N/A] Exudate Type: [1:Serous] [3:Serous] [N/A:N/A] Exudate Color: [1:amber] [3:amber] [N/A:N/A] Wound Margin: Flat and Intact Flat and Intact N/A Granulation Amount: Large (67-100%) Large (67-100%) N/A Granulation Quality: Red, Pink Red, Pink N/A Necrotic Amount: Small (1-33%) Small (1-33%) N/A Necrotic Tissue: Adherent Slough Eschar,  Adherent Slough N/A Exposed Structures: Fascia: No Fascia: No N/A Fat: No Fat: No Tendon: No Tendon: No Muscle: No Muscle: No Joint: No Joint: No Bone: No Bone: No Limited to Skin Limited to Skin Breakdown Breakdown Epithelialization: None None N/A Periwound Skin Texture: Edema: No Edema: No N/A Excoriation: No Excoriation: No Induration: No Induration: No Callus: No Callus: No Crepitus: No Crepitus: No Fluctuance: No Fluctuance: No Friable: No Friable: No Rash: No Rash: No Scarring: No Scarring: No Periwound Skin Maceration: No Maceration: No N/A Moisture: Moist: No Moist: No Dry/Scaly: No Dry/Scaly: No Periwound Skin Color: Atrophie Blanche: No Atrophie Blanche: No N/A Cyanosis: No Cyanosis: No Ecchymosis: No Ecchymosis: No Erythema: No Erythema: No Hemosiderin Staining: No Hemosiderin Staining: No Mottled: No Mottled: No Pallor: No Pallor: No Rubor: No Rubor: No Tenderness on Yes Yes N/A Palpation: Wound Preparation: Ulcer Cleansing: Ulcer Cleansing: N/A Rinsed/Irrigated with Rinsed/Irrigated with Saline Saline Topical Anesthetic Topical Anesthetic Applied: Other: lidocaine Applied: Other: lidocaine 4% 4% Treatment Notes Electronic Signature(s) Signed: 04/11/2015 5:45:44 PM By: Curtis Sites Entered By: Curtis Sites on 04/11/2015 09:42:40 INFANT, ZINK ([id number]) LESLEY, GALENTINE ([id number]) -------------------------------------------------------------------------------- Multi-Disciplinary Care Plan Details Patient Name: UMAR, PATMON. Date of Service: 04/11/2015 9:15 AM Medical Record Number: [id number] Patient Account Number: [id number] Date of Birth/Sex: 09/22/1965 (49 y.o. Male) Treating RN: Curtis Sites Primary Care Physician: Other Clinician: Referring Physician: Merwyn Katos Treating Physician/Extender: Rudene Re in Treatment: 6 Active Inactive Abuse / Safety / Falls / Self Care Management Nursing  Diagnoses: Potential for falls Goals: Patient will remain injury free Date Initiated: 02/28/2015 Goal Status: Active Interventions: Assess fall risk on admission and as needed Notes: Nutrition Nursing Diagnoses: Impaired glucose control: actual or potential Goals: Patient/caregiver agrees to and verbalizes understanding of need to obtain nutritional consultation Date Initiated: 02/28/2015 Goal Status: Active Interventions: Assess patient nutrition upon admission and as needed per policy Notes: Orientation to the Wound Care Program Nursing Diagnoses: Knowledge deficit related to the wound healing center program Goals: Patient/caregiver will verbalize understanding of the Wound Healing Center Program Date Initiated: 02/28/2015 KYM, FENTER ([id number]) Goal Status: Active Interventions: Provide education on orientation to the wound center Notes: Pressure Nursing Diagnoses: Potential for impaired tissue integrity related to pressure, friction, moisture, and shear Goals: Patient will remain free of pressure ulcers Date Initiated: 02/28/2015 Goal Status: Active Interventions: Assess: immobility, friction, shearing, incontinence upon admission and as needed Notes: Wound/Skin Impairment Nursing Diagnoses: Impaired tissue integrity Goals: Ulcer/skin breakdown will heal within 14 weeks Date Initiated: 02/28/2015 Goal Status: Active Interventions: Assess patient/caregiver ability to obtain necessary supplies Notes: Electronic Signature(s) Signed: 04/11/2015 5:45:44 PM By: Francesco Sor,  Mardene Celeste Entered By: Curtis Sites on 04/11/2015 09:42:33 [name] ([id number]) -------------------------------------------------------------------------------- Patient/Caregiver Education Details Patient Name: [name]. Date of Service: 04/11/2015 9:15 AM Medical Record Number: [id number] Patient Account Number: [id number] Date of Birth/Gender: 09-20-1965 (49 y.o.  Male) Treating RN: Curtis Sites Primary Care Physician: Other Clinician: Referring Physician: Merwyn Katos Treating Physician/Extender: Rudene Re in Treatment: 6 Education Assessment Education Provided To: Patient Education Topics Provided Wound/Skin Impairment: Handouts: Other: get dh walking boot Methods: Explain/Verbal Responses: State content correctly Electronic Signature(s) Signed: 04/11/2015 12:04:55 PM By: Curtis Sites Entered By: Curtis Sites on 04/11/2015 12:04:55 [name] ([id number]) -------------------------------------------------------------------------------- Wound Assessment Details Patient Name: [name]. Date of Service: 04/11/2015 9:15 AM Medical Record Number: [id number] Patient Account Number: [id number] Date of Birth/Sex: 06-16-1965 (49 y.o. Male) Treating RN: Curtis Sites Primary Care Physician: Other Clinician: Referring Physician: Merwyn Katos Treating Physician/Extender: Rudene Re in Treatment: 6 Wound Status Wound Number: 1 Primary Diabetic Wound/Ulcer of the Lower Etiology: Extremity Wound Location: Right Foot - Plantar Secondary Pressure Ulcer Wounding Event: Pressure Injury Etiology: Date Acquired: 02/18/2014 Wound Open Weeks Of Treatment: 6 Status: Clustered Wound: No Comorbid Chronic sinus problems/congestion, History: Anemia, Asthma, Hypertension, Myocardial Infarction, Type II Diabetes, Gout, Rheumatoid Arthritis, Osteoarthritis, Neuropathy, Confinement Anxiety Photos Photo Uploaded By: Curtis Sites on 04/11/2015 17:20:21 Wound Measurements Length: (cm) 0.8 Width: (cm) 0.9 Depth: (cm) 0.4 Area: (cm) 0.565 Volume: (cm) 0.226 % Reduction in Area: 42.9% % Reduction in Volume: 67.4% Epithelialization: None Tunneling: No Undermining: No Wound Description Classification: Grade 2 Foul Odor Afte Wound Margin: Flat and Intact Exudate Amount: Medium Exudate Type: Serous Exudate  Color: amber DARION, MILEWSKI ([id number]) r Cleansing: No Wound Bed Granulation Amount: Large (67-100%) Exposed Structure Granulation Quality: Red, Pink Fascia Exposed: No Necrotic Amount: Small (1-33%) Fat Layer Exposed: No Necrotic Quality: Adherent Slough Tendon Exposed: No Muscle Exposed: No Joint Exposed: No Bone Exposed: No Limited to Skin Breakdown Periwound Skin Texture Texture Color No Abnormalities Noted: No No Abnormalities Noted: No Callus: No Atrophie Blanche: No Crepitus: No Cyanosis: No Excoriation: No Ecchymosis: No Fluctuance: No Erythema: No Friable: No Hemosiderin Staining: No Induration: No Mottled: No Localized Edema: No Pallor: No Rash: No Rubor: No Scarring: No Temperature / Pain Moisture Tenderness on Palpation: Yes No Abnormalities Noted: No Dry / Scaly: No Maceration: No Moist: No Wound Preparation Ulcer Cleansing: Rinsed/Irrigated with Saline Topical Anesthetic Applied: Other: lidocaine 4%, Treatment Notes Wound #1 (Right, Plantar Foot) 1. Cleansed with: Clean wound with Normal Saline 2. Anesthetic Topical Lidocaine 4% cream to wound bed prior to debridement 4. Dressing Applied: Aquacel Ag 5. Secondary Dressing Applied Guaze, ABD and kerlix/Conform 7. Secured with Tape Notes HAYDON, DORRIS ([id number]) darco front offloader on left foot, darco with peg assist on right foot Electronic Signature(s) Signed: 04/11/2015 5:45:44 PM By: Curtis Sites Entered By: Curtis Sites on 04/11/2015 09:41:37 Ronan, [name] ([id number]) -------------------------------------------------------------------------------- Wound Assessment Details Patient Name: [name]. Date of Service: 04/11/2015 9:15 AM Medical Record Number: [id number] Patient Account Number: [id number] Date of Birth/Sex: Sep 30, 1965 (49 y.o. Male) Treating RN: Curtis Sites Primary Care Physician: Other Clinician: Referring Physician: Merwyn Katos Treating  Physician/Extender: Rudene Re in Treatment: 6 Wound Status Wound Number: 3 Primary Diabetic Wound/Ulcer of the Lower Etiology: Extremity Wound Location: Left Metatarsal head first - Plantar Secondary Pressure Ulcer Etiology: Wounding Event: Pressure Injury Wound Open Date Acquired: 02/18/2014 Status: Weeks Of Treatment: 6 Comorbid Chronic sinus problems/congestion, Clustered Wound: No History: Anemia, Asthma, Hypertension,  Myocardial Infarction, Type II Diabetes, Gout, Rheumatoid Arthritis, Osteoarthritis, Neuropathy, Confinement Anxiety Photos Photo Uploaded By: Curtis Sites on 04/11/2015 17:20:22 Wound Measurements Length: (cm) 0.8 Width: (cm) 0.5 Depth: (cm) 0.5 Area: (cm) 0.314 Volume: (cm) 0.157 % Reduction in Area: 63.7% % Reduction in Volume: 54.6% Epithelialization: None Tunneling: No Undermining: No Wound Description Classification: Grade 2 Foul Odor Aft Wound Margin: Flat and Intact Exudate Amount: Large Exudate Type: Serous Exudate Color: amber MARLAN, STEWARD ([id number]) er Cleansing: No Wound Bed Granulation Amount: Large (67-100%) Exposed Structure Granulation Quality: Red, Pink Fascia Exposed: No Necrotic Amount: Small (1-33%) Fat Layer Exposed: No Necrotic Quality: Eschar, Adherent Slough Tendon Exposed: No Muscle Exposed: No Joint Exposed: No Bone Exposed: No Limited to Skin Breakdown Periwound Skin Texture Texture Color No Abnormalities Noted: No No Abnormalities Noted: No Callus: No Atrophie Blanche: No Crepitus: No Cyanosis: No Excoriation: No Ecchymosis: No Fluctuance: No Erythema: No Friable: No Hemosiderin Staining: No Induration: No Mottled: No Localized Edema: No Pallor: No Rash: No Rubor: No Scarring: No Temperature / Pain Moisture Tenderness on Palpation: Yes No Abnormalities Noted: No Dry / Scaly: No Maceration: No Moist: No Wound Preparation Ulcer Cleansing: Rinsed/Irrigated with  Saline Topical Anesthetic Applied: Other: lidocaine 4%, Treatment Notes Wound #3 (Left, Plantar Metatarsal head first) 1. Cleansed with: Clean wound with Normal Saline 2. Anesthetic Topical Lidocaine 4% cream to wound bed prior to debridement 4. Dressing Applied: Aquacel Ag 5. Secondary Dressing Applied Guaze, ABD and kerlix/Conform 7. Secured with Tape Notes HELIOS, KOHLMANN ([id number]) darco front offloader on left foot, darco with peg assist on right foot Electronic Signature(s) Signed: 04/11/2015 5:45:44 PM By: Curtis Sites Entered By: Curtis Sites on 04/11/2015 09:41:57 Garringer, [name] ([id number]) -------------------------------------------------------------------------------- Vitals Details Patient Name: [name]. Date of Service: 04/11/2015 9:15 AM Medical Record Number: [id number] Patient Account Number: [id number] Date of Birth/Sex: March 02, 1966 (49 y.o. Male) Treating RN: Curtis Sites Primary Care Physician: Other Clinician: Referring Physician: Merwyn Katos Treating Physician/Extender: Rudene Re in Treatment: 6 Vital Signs Time Taken: 09:34 Temperature (F): 98.3 Height (in): 72 Pulse (bpm): 72 Weight (lbs): 228 Respiratory Rate (breaths/min): 18 Body Mass Index (BMI): 30.9 Blood Pressure (mmHg): 130/69 Reference Range: 80 - 120 mg / dl Electronic Signature(s) Signed: 04/11/2015 5:45:44 PM By: Curtis Sites Entered By: Curtis Sites on 04/11/2015 09:35:05

## 2015-04-18 ENCOUNTER — Ambulatory Visit: Payer: Medicaid Other | Admitting: General Surgery

## 2015-04-25 ENCOUNTER — Encounter: Payer: Medicaid Other | Attending: Surgery | Admitting: Surgery

## 2015-04-25 DIAGNOSIS — L97522 Non-pressure chronic ulcer of other part of left foot with fat layer exposed: Secondary | ICD-10-CM | POA: Diagnosis not present

## 2015-04-25 DIAGNOSIS — E11621 Type 2 diabetes mellitus with foot ulcer: Secondary | ICD-10-CM | POA: Insufficient documentation

## 2015-04-25 DIAGNOSIS — L97519 Non-pressure chronic ulcer of other part of right foot with unspecified severity: Secondary | ICD-10-CM | POA: Insufficient documentation

## 2015-04-25 DIAGNOSIS — L97512 Non-pressure chronic ulcer of other part of right foot with fat layer exposed: Secondary | ICD-10-CM | POA: Diagnosis not present

## 2015-04-25 DIAGNOSIS — E1142 Type 2 diabetes mellitus with diabetic polyneuropathy: Secondary | ICD-10-CM | POA: Insufficient documentation

## 2015-04-25 DIAGNOSIS — L84 Corns and callosities: Secondary | ICD-10-CM | POA: Diagnosis not present

## 2015-05-02 ENCOUNTER — Encounter: Payer: Medicaid Other | Admitting: Surgery

## 2015-05-02 DIAGNOSIS — L97519 Non-pressure chronic ulcer of other part of right foot with unspecified severity: Secondary | ICD-10-CM | POA: Diagnosis not present

## 2015-05-03 NOTE — Progress Notes (Addendum)
William, [name] ([id number]) Visit Report for 05/02/2015 Arrival Information Details Patient Name: [name], [name]. Date of Service: 05/02/2015 10:45 AM Medical Record Number: [id number] Patient Account Number: [id number] Date of Birth/Sex: Nov 25, 1965 (50 y.o. Male) Treating RN: Clover Mealy, RN, BSN, [place] Sink Primary Care Physician: Other Clinician: Referring Physician: Merwyn Katos Treating Physician/Extender: Rudene Re in Treatment: 9 Visit Information History Since Last Visit All ordered tests and consults were completed: No Patient Arrived: [name] Added or deleted any medications: No Arrival Time: 10:11 Any new allergies or adverse reactions: No Accompanied By: self Had a fall or experienced change in No Transfer Assistance: None activities of daily living that may affect Patient Identification Verified: Yes risk of falls: Secondary Verification Process Yes Signs or symptoms of abuse/neglect since last No Completed: visito Patient Has Alerts: Yes Hospitalized since last visit: No Patient Alerts: Type II Has Dressing in Place as Prescribed: Yes Diabetic Pain Present Now: No Electronic Signature(s) Signed: 05/02/2015 5:20:37 PM By: Elpidio Eric BSN, RN Entered By: Elpidio Eric on 05/02/2015 10:12:17 [name] ([id number]) -------------------------------------------------------------------------------- Clinic Level of Care Assessment Details Patient Name: [name]. Date of Service: 05/02/2015 10:45 AM Medical Record Number: [id number] Patient Account Number: [id number] Date of Birth/Sex: 1966-03-05 (50 y.o. Male) Treating RN: Clover Mealy, RN, BSN, Rita Primary Care Physician: Other Clinician: Referring Physician: Merwyn Katos Treating Physician/Extender: Rudene Re in Treatment: 9 Clinic Level of Care Assessment Items TOOL 4 Quantity Score []  - Use when only an EandM is performed on FOLLOW-UP visit 0 ASSESSMENTS - Nursing Assessment / Reassessment X -  Reassessment of Co-morbidities (includes updates in patient status) 1 10 X - Reassessment of Adherence to Treatment Plan 1 5 ASSESSMENTS - Wound and Skin Assessment / Reassessment []  - Simple Wound Assessment / Reassessment - one wound 0 X - Complex Wound Assessment / Reassessment - multiple wounds 2 5 []  - Dermatologic / Skin Assessment (not related to wound area) 0 ASSESSMENTS - Focused Assessment []  - Circumferential Edema Measurements - multi extremities 0 []  - Nutritional Assessment / Counseling / Intervention 0 X - Lower Extremity Assessment (monofilament, tuning fork, pulses) 1 5 []  - Peripheral Arterial Disease Assessment (using hand held doppler) 0 ASSESSMENTS - Ostomy and/or Continence Assessment and Care []  - Incontinence Assessment and Management 0 []  - Ostomy Care Assessment and Management (repouching, etc.) 0 PROCESS - Coordination of Care X - Simple Patient / Family Education for ongoing care 1 15 []  - Complex (extensive) Patient / Family Education for ongoing care 0 []  - Staff obtains , Records, Test Results / Process Orders 0 []  - Staff telephones HHA, Nursing Homes / Clarify orders / etc 0 []  - Routine Transfer to another Facility (non-emergent condition) 0 William, [name] ( ) []  - Routine Hospital Admission (non-emergent condition) 0 []  - New Admissions / / Ordering NPWT, Apligraf, etc. 0 []  - Emergency Hospital Admission (emergent condition) 0 []  - Simple Discharge Coordination 0 []  - Complex (extensive) Discharge Coordination 0 PROCESS - Special Needs []  - Pediatric / Minor Patient Management 0 []  - Isolation Patient Management 0 []  - Hearing / Language / Visual special needs 0 []  - Assessment of Community assistance (transportation, D/C planning, etc.) 0 []  - Additional assistance / Altered mentation 0 []  - Support Surface(s) Assessment (bed, cushion, seat, etc.) 0 INTERVENTIONS - Wound Cleansing / Measurement []  -  Simple Wound Cleansing - one wound 0 X - Complex Wound Cleansing - multiple wounds 2 5 []  - Wound Imaging (photographs -  any number of wounds) 0 []  - Wound Tracing (instead of photographs) 0 []  - Simple Wound Measurement - one wound 0 X - Complex Wound Measurement - multiple wounds 2 5 INTERVENTIONS - Wound Dressings X - Small Wound Dressing one or multiple wounds 2 10 []  - Medium Wound Dressing one or multiple wounds 0 []  - Large Wound Dressing one or multiple wounds 0 []  - Application of Medications - topical 0 []  - Application of Medications - injection 0 INTERVENTIONS - Miscellaneous []  - External ear exam 0 William, [name] ( ) []  - Specimen Collection (cultures, biopsies, blood, body fluids, etc.) 0 []  - Specimen(s) / Culture(s) sent or taken to Lab for analysis 0 []  - Patient Transfer (multiple staff / Lift / Similar devices) 0 []  - Simple Staple / Suture removal (25 or less) 0 []  - Complex Staple / Suture removal (26 or more) 0 []  - Hypo / Hyperglycemic Management (close monitor of Blood Glucose) 0 []  - Ankle / Brachial Index (ABI) - do not check if billed separately 0 X - Vital Signs 1 5 Has the patient been seen at the hospital within the last three years: Yes Total Score: 90 Level Of Care: New/Established - Level 3 Electronic Signature(s) Signed: 05/02/2015 5:20:37 PM By: BSN, RN Entered By: on 05/02/2015 10:41:10 [name] ([id number]) -------------------------------------------------------------------------------- Encounter Discharge Information Details Patient Name: . Date of Service: 05/02/2015 10:45 AM Medical Record Number: Patient Account Number: [name] Date of Birth/Sex: 1966/01/28 (50 y.o. Male) Treating RN: Primary Care Physician: Other Clinician: Referring Physician: Treating Physician/Extender: 06/30/2015 in Treatment: 9 Encounter Discharge  Information Items Discharge Pain Level: 0 Discharge Condition: Stable Ambulatory Status: Cane Discharge Destination: Home Transportation: Other Schedule Follow-up Appointment: No Medication Reconciliation completed and provided to Patient/Care Yes Timarie Labell: Provided on Clinical Summary of Care: 05/02/2015 Form Type Recipient Paper Patient [facility] Electronic Signature(s) Signed: 05/02/2015 4:39:39 PM By: William Bake RN, Sendra Previous Signature: 05/02/2015 10:44:41 AM Version By: [name] Entered By: 06/30/2015 RN, Sendra on 05/02/2015 10:45:57 [id number] (11/24/1965) -------------------------------------------------------------------------------- General Visit Notes Details Patient Name: (55. Date of Service: 05/02/2015 10:45 AM Medical Record Number: Merwyn Katos Patient Account Number: Rudene Re Date of Birth/Sex: [birth year] (50 y.o. Male) Treating RN: 06/30/2015 Primary Care Physician: Other Clinician: Referring Physician: Lucrezia Starch Treating Physician/Extender: 06/30/2015 in Treatment: 9 Notes Client stated to nurse during assessment that he allows his dogs to lick his open wounds to his feet because dogs salvia heals wounds. Discouraged client from using that technique and explained to him that dogs salvia carry many bacteria that could cause infection in his wounds. Client states he has done extensive research on the theory and that he was going to continue it. Also note that client states he did not purchase the recommended walking shoe because he doesn't need it and that he is using his regular tennis shoes. Dr. Gwenlyn Perking and case manager, Lucrezia Starch RN updated. Electronic Signature(s) Signed: 05/02/2015 4:39:39 PM By: William Bake RN, [id number] By: William Bake RN, Sendra on 05/02/2015 10:59:02 [id number] ([id number]) -------------------------------------------------------------------------------- Lower Extremity Assessment Details Patient  Name: AKSHAY, SPANG. Date of Service: 05/02/2015 10:45 AM Medical Record Number: Leonard Downing Patient Account Number: Merwyn Katos Date of Birth/Sex: 1965-09-02 (50 y.o. Male) Treating RN: [place] Sink, RN, BSN, 06/30/2015 Primary Care Physician: Other Clinician: Referring Physician: Lucrezia Starch Treating Physician/Extender: Lennice [name] in Treatment: 9 Edema Assessment Assessed: [Left: No] [Right: No]  Edema: [Left: No] [Right: No] Vascular Assessment Pulses: Posterior Tibial Dorsalis Pedis Palpable: [Left:Yes] [Right:Yes] Extremity colors, hair growth, and conditions: Extremity Color: [Left:Normal] [Right:Normal] Hair Growth on Extremity: [Left:Yes] [Right:Yes] Temperature of Extremity: [Left:Warm] [Right:Warm] Capillary Refill: [Left:< 3 seconds] [Right:< 3 seconds] Electronic Signature(s) Signed: 05/02/2015 5:20:37 PM By: Elpidio Eric BSN, RN Entered By: Elpidio Eric on 05/02/2015 10:15:44 [name] ([id number]) -------------------------------------------------------------------------------- Multi-Disciplinary Care Plan Details Patient Name: [name]. Date of Service: 05/02/2015 10:45 AM Medical Record Number: [id number] Patient Account Number: [id number] Date of Birth/Sex: 04-Aug-1965 (50 y.o. Male) Treating RN: Clover Mealy, RN, BSN, [place] Sink Primary Care Physician: Other Clinician: Referring Physician: Merwyn Katos Treating Physician/Extender: Rudene Re in Treatment: 9 Active Inactive Electronic Signature(s) Signed: 05/03/2015 9:02:06 AM By: Elpidio Eric BSN, RN Entered By: Elpidio Eric on 05/03/2015 09:02:06 [name] ([id number]) -------------------------------------------------------------------------------- Pain Assessment Details Patient Name: [name]. Date of Service: 05/02/2015 10:45 AM Medical Record Number: [id number] Patient Account Number: [id number] Date of Birth/Sex: 27-Dec-1965 (50 y.o. Male) Treating RN: Clover Mealy, RN, BSN, [place] Sink Primary Care  Physician: Other Clinician: Referring Physician: Merwyn Katos Treating Physician/Extender: Rudene Re in Treatment: 9 Active Problems Location of Pain Severity and Description of Pain Patient Has Paino No Site Locations Rate the pain. Current Pain Level: 0 Pain Management and Medication Current Pain Management: Electronic Signature(s) Signed: 05/02/2015 5:20:37 PM By: Elpidio Eric BSN, RN Entered By: Elpidio Eric on 05/02/2015 10:12:24 [name] ([id number]) -------------------------------------------------------------------------------- Patient/Caregiver Education Details Patient Name: [name]. Date of Service: 05/02/2015 10:45 AM Medical Record Number: [id number] Patient Account Number: [id number] Date of Birth/Gender: 1966/03/02 (50 y.o. Male) Treating RN: [place] Downing Primary Care Physician: Other Clinician: Referring Physician: Merwyn Katos Treating Physician/Extender: Rudene Re in Treatment: 9 Education Assessment Education Provided To: Patient Education Topics Provided Wound/Skin Impairment: Handouts: Caring for Your Ulcer, Skin Care Do's and Dont's Methods: Explain/Verbal Responses: State content correctly Electronic Signature(s) Signed: 05/02/2015 4:39:39 PM By: Lucrezia Starch, RN, Sendra Entered By: Lucrezia Starch RN, Sendra on 05/02/2015 10:46:14 [name] ([id number]) -------------------------------------------------------------------------------- Wound Assessment Details Patient Name: [name]. Date of Service: 05/02/2015 10:45 AM Medical Record Number: [id number] Patient Account Number: [id number] Date of Birth/Sex: 1965-09-11 (50 y.o. Male) Treating RN: Clover Mealy, RN, BSN, Rita Primary Care Physician: Other Clinician: Referring Physician: Merwyn Katos Treating Physician/Extender: Rudene Re in Treatment: 9 Wound Status Wound Number: 1 Primary Diabetic Wound/Ulcer of the Lower Etiology: Extremity Wound Location:  Right Foot - Plantar Secondary Pressure Ulcer Wounding Event: Pressure Injury Etiology: Date Acquired: 02/18/2014 Wound Open Weeks Of Treatment: 9 Status: Clustered Wound: No Comorbid Chronic sinus problems/congestion, History: Anemia, Asthma, Hypertension, Myocardial Infarction, Type II Diabetes, Gout, Rheumatoid Arthritis, Osteoarthritis, Neuropathy, Confinement Anxiety Photos Photo Uploaded By: Alejandro Mulling on 05/02/2015 16:25:45 Wound Measurements Length: (cm) 0.5 Width: (cm) 0.6 Depth: (cm) 0.3 Area: (cm) 0.236 Volume: (cm) 0.071 % Reduction in Area: 76.2% % Reduction in Volume: 89.8% Epithelialization: None Tunneling: No Undermining: No Wound Description Classification: Grade 2 Foul Odor Afte Wound Margin: Flat and Intact Exudate Amount: Medium Exudate Type: Serous Exudate Color: amber DJUAN, WIEDENHOEFT ([id number]) r Cleansing: No Wound Bed Granulation Amount: Large (67-100%) Exposed Structure Granulation Quality: Red, Pink Fascia Exposed: No Necrotic Amount: Small (1-33%) Fat Layer Exposed: No Necrotic Quality: Adherent Slough Tendon Exposed: No Muscle Exposed: No Joint Exposed: No Bone Exposed: No Limited to Skin Breakdown Periwound Skin Texture Texture Color No Abnormalities Noted: No No Abnormalities Noted: No Callus: Yes Atrophie Blanche: No Crepitus: No Cyanosis: No  Excoriation: No Ecchymosis: No Fluctuance: No Erythema: No Friable: No Hemosiderin Staining: No Induration: No Mottled: No Localized Edema: No Pallor: No Rash: No Rubor: No Scarring: No Temperature / Pain Moisture Tenderness on Palpation: Yes No Abnormalities Noted: No Dry / Scaly: No Maceration: No Moist: No Wound Preparation Ulcer Cleansing: Rinsed/Irrigated with Saline Topical Anesthetic Applied: Other: lidocaine 4%, Electronic Signature(s) Signed: 05/02/2015 5:20:37 PM By: Elpidio Eric BSN, RN Entered By: Elpidio Eric on 05/02/2015 10:17:21 [name] ([id number]) -------------------------------------------------------------------------------- Wound Assessment Details Patient Name: [name]. Date of Service: 05/02/2015 10:45 AM Medical Record Number: [id number] Patient Account Number: [id number] Date of Birth/Sex: [birth year] (50 y.o. Male) Treating RN: Clover Mealy, RN, BSN, Rita Primary Care Physician: Other Clinician: Referring Physician: Merwyn Katos Treating Physician/Extender: Rudene Re in Treatment: 9 Wound Status Wound Number: 3 Primary Diabetic Wound/Ulcer of the Lower Etiology: Extremity Wound Location: Left Metatarsal head first - Plantar Secondary Pressure Ulcer Etiology: Wounding Event: Pressure Injury Wound Open Date Acquired: 02/18/2014 Status: Weeks Of Treatment: 9 Comorbid Chronic sinus problems/congestion, Clustered Wound: No History: Anemia, Asthma, Hypertension, Myocardial Infarction, Type II Diabetes, Gout, Rheumatoid Arthritis, Osteoarthritis, Neuropathy, Confinement Anxiety Photos Photo Uploaded By: Alejandro Mulling on 05/02/2015 16:25:45 Wound Measurements Length: (cm) 1.2 Width: (cm) 1 Depth: (cm) 1 Area: (cm) 0.942 Volume: (cm) 0.942 % Reduction in Area: -9% % Reduction in Volume: -172.3% Epithelialization: None Tunneling: No Undermining: No Wound Description Classification: Grade 2 Wound Margin: Flat and Intact Exudate Amount: Large Exudate Type: Serosanguineous Exudate Color: red, brown VIRGLE, ARTH ([id number]) Foul Odor After Cleansing: No Wound Bed Granulation Amount: Large (67-100%) Exposed Structure Granulation Quality: Red, Pink Fascia Exposed: No Necrotic Amount: Small (1-33%) Fat Layer Exposed: No Necrotic Quality: Eschar, Adherent Slough Tendon Exposed: No Muscle Exposed: No Joint Exposed: No Bone Exposed: No Limited to Skin Breakdown Periwound Skin Texture Texture Color No Abnormalities Noted: No No Abnormalities Noted: No Callus: Yes Atrophie  Blanche: No Crepitus: No Cyanosis: No Excoriation: No Ecchymosis: No Fluctuance: No Erythema: No Friable: No Hemosiderin Staining: No Induration: No Mottled: No Localized Edema: No Pallor: No Rash: No Rubor: No Scarring: No Temperature / Pain Moisture Tenderness on Palpation: Yes No Abnormalities Noted: No Dry / Scaly: No Maceration: No Moist: No Wound Preparation Ulcer Cleansing: Rinsed/Irrigated with Saline Topical Anesthetic Applied: Other: lidocaine 4%, Electronic Signature(s) Signed: 05/02/2015 5:20:37 PM By: Elpidio Eric BSN, RN Entered By: Elpidio Eric on 05/02/2015 10:19:06 [name] ([id number]) -------------------------------------------------------------------------------- Vitals Details Patient Name: [name]. Date of Service: 05/02/2015 10:45 AM Medical Record Number: [id number] Patient Account Number: [id number] Date of Birth/Sex: [birth year] (50 y.o. Male) Treating RN: Afful, RN, BSN, Rita Primary Care Physician: Other Clinician: Referring Physician: Merwyn Katos Treating Physician/Extender: Rudene Re in Treatment: 9 Vital Signs Time Taken: 10:12 Temperature (F): 97.5 Height (in): 72 Pulse (bpm): 70 Weight (lbs): 228 Blood Pressure (mmHg): 135/77 Body Mass Index (BMI): 30.9 Reference Range: 80 - 120 mg / dl Electronic Signature(s) Signed: 05/02/2015 5:20:37 PM By: Elpidio Eric BSN, RN Entered By: Elpidio Eric on 05/02/2015 10:14:26

## 2015-05-03 NOTE — Progress Notes (Addendum)
JAZMIN, VENSEL ([id number]) Visit Report for 05/02/2015 Chief Complaint Document Details Patient Name: [name], [name]. Date of Service: 05/02/2015 10:45 AM Medical Record Number: [id number] Patient Account Number: [id number] Date of Birth/Sex: 06/21/1965 (50 y.o. Male) Treating RN: Leonard Downing Primary Care Physician: Other Clinician: Referring Physician: Merwyn Katos Treating Physician/Extender: Rudene Re in Treatment: 9 Information Obtained from: Patient Chief Complaint Patients presents for treatment of an open diabetic ulcer. He presents with ulcers on both feet which she's had for over a year and a half. He has been seen at the wound care center at [facility] and was a patient of Dr. Leanord Hawking for about 2 months Electronic Signature(s) Signed: 05/02/2015 11:33:09 AM By: Evlyn Kanner MD, FACS Entered By: Evlyn Kanner on 05/02/2015 11:33:09 [name] ([id number]) -------------------------------------------------------------------------------- HPI Details Patient Name: [name]. Date of Service: 05/02/2015 10:45 AM Medical Record Number: [id number] Patient Account Number: [id number] Date of Birth/Sex: 06/09/1965 (50 y.o. Male) Treating RN: Leonard Downing Primary Care Physician: Other Clinician: Referring Physician: Merwyn Katos Treating Physician/Extender: Rudene Re in Treatment: 9 History of Present Illness Location: ulcers on the plantar aspect of both feet for over a year and a half Quality: Patient reports No Pain. Severity: Patient states wound are getting worse. Duration: Patient has had the wound for > 18 months prior to seeking treatment at the wound center Context: The wound appeared gradually over time Modifying Factors: Other treatment(s) tried include: his podiatrist has been treating him with Regranex and he has been seen at the [facility] wound care center and treated with surgical debridement, antibiotics and total contact  cast. Associated Signs and Symptoms: Patient reports having increase discharge. HPI Description: This is a 50 year old man who comes in for review of wounds on his bilateral plantar feet which are been present for one and a half year. he has been seen at the [facility] wound care center by Dr. Leanord Hawking and was treated there for a couple of months and I'm not entirely sure why he changed to this center. He says it was difficult for him to get an appointment due to shortage of staff. The last time around he had a total contact cast which he cut himself because he was unable to go back to [place]. He has severe diabetic neuropathy with lower extremity pain. He has had a wound over his left plantar first metatarsal head and a wound on the right foot at roughly the second and third plantar metatarsal heads. He has a Darco sandal on the left and a healing sandal on the right. For roughly the last 8 months. He has been using Regranex and this was been followed by his podiatrist at [place] [facility] He has had multiple x-rays of his feet according to the patient that have not shown infection. He has not had an MRI. He was treated with doxycycline and Septra in the past for a staph aureus infection. He has never been a smoker. he goes to a pain clinic for pain medications and is regularly seen for his primary care needs by his PCP. 03/21/2015 -- the patient failed to keep his appointment for a cast change on Monday because of transportation issues. He said by Tuesday he managed to go to the local hospital and get the cast open. He has an abrasion on the dorsum of his right foot and this may have been caused by the cast rubbing on his foot. His blood sugars have been okay. 03/28/2015 --  he agrees to have a TCC applied to his right lower extremity and be again discussed getting a DH walking boot for his left low lower extremity. 04/04/2015 -- the patient says he cannot tolerate  the total contact cast as his feet sweat a lot and he has a terrible odor and he just cannot live with his total contact cast. He refuses to have another one applied but this time around agrees that he will buy a DH walking boot 04/25/2015 -- as noted above the patient continues to be noncompliant has not come for over 2 weeks and says he has problems with his transport. He also says that he has been unable to buy his DH walking boot because of monetary issues and he cannot tolerate the total contact cast. LADARIS, BESSE ([id number]) 05/02/2015 -- the patient is here today and clearly states that he will not use the total contact cast now 20 by the [facility] walking boot as he doesn't believe he needs to. He says he is going to walk around with his tennis shoes and in the course of conversation that my nurses and me know that he has been asking his dog's to lick his wounds as they have healing properties. He is quite adamant about continuing to do this and every good [facility] on my part to try him to stop doing this he refuses to. He then went on to state that he really does not need to come to the wound center and is able to look after his wound care himself. Electronic Signature(s) Signed: 05/02/2015 11:34:55 AM By: Evlyn Kanner MD, FACS Entered By: Evlyn Kanner on 05/02/2015 11:34:55 [name] ([id number]) -------------------------------------------------------------------------------- Physical Exam Details Patient Name: [name]. Date of Service: 05/02/2015 10:45 AM Medical Record Number: [id number] Patient Account Number: [id number] Date of Birth/Sex: 20-Feb-1966 (50 y.o. Male) Treating RN: Leonard Downing Primary Care Physician: Other Clinician: Referring Physician: Merwyn Katos Treating Physician/Extender: Rudene Re in Treatment: 9 Notes in view of the fact that the patient will not agree to our instructions we have done our local dressing for him as before and he  wants to be discharged AGAINST MEDICAL ADVICE. No examination or debridement was done today Electronic Signature(s) Signed: 05/02/2015 11:35:54 AM By: Evlyn Kanner MD, FACS Entered By: Evlyn Kanner on 05/02/2015 11:35:54 [name] ([id number]) -------------------------------------------------------------------------------- Physician Orders Details Patient Name: [name]. Date of Service: 05/02/2015 10:45 AM Medical Record Number: [id number] Patient Account Number: [id number] Date of Birth/Sex: 1965-09-23 (50 y.o. Male) Treating RN: Clover Mealy, RN, BSN,  Sink Primary Care Physician: Other Clinician: Referring Physician: Merwyn Katos Treating Physician/Extender: Rudene Re in Treatment: 9 Verbal / Phone Orders: Yes Clinician: Afful, RN, BSN, Rita Read Back and Verified: Yes Diagnosis Coding Discharge From [facility] Services Wound #1 Right,Plantar Foot o Discharge from Wound Care Center - wound not healed. But patient is being discharged against medical advise, due to noncompliance, patient states " the saliva from my dog has healing powers when it licks the wound" Wound #3 Left,Plantar Metatarsal head first o Discharge from Wound Care Center - wound not healed. But patient is being discharged against medical advise, due to noncompliance, patient states " the saliva from my dog has healing powers when it licks the wound" Electronic Signature(s) Signed: 05/03/2015 9:08:34 AM By: Elpidio Eric BSN, RN Signed: 05/03/2015 3:13:05 PM By: Evlyn Kanner MD, FACS Previous Signature: 05/02/2015 4:38:48 PM Version By: Evlyn Kanner MD, FACS Previous Signature: 05/02/2015 5:20:37 PM Version By: Elpidio Eric  BSN, RN Entered By: Elpidio Eric on 05/03/2015 09:08:34 [name] ([id number]) -------------------------------------------------------------------------------- Problem List Details Patient Name: [name]. Date of Service: 05/02/2015 10:45 AM Medical Record Number:  [id number] Patient Account Number: [id number] Date of Birth/Sex: [birth year] (49 y.o. 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 Date of Service: 05/02/2015 10:45 AM Medical Record Number: [id number] Patient Account Number: [id number] Date of Birth/Sex: May 27, 1965 (50 y.o. Male) Treating RN: Leonard Downing Primary Care Physician: Other Clinician: Referring Physician: Merwyn Katos Treating Physician/Extender: Rudene Re in Treatment: 9 Subjective Chief Complaint Information obtained from Patient Patients presents for treatment of an open diabetic ulcer. He presents with ulcers on both feet which she's had for over a year and a half. He has been seen at the wound care center at [facility] and was a patient of Dr. Leanord Hawking for about 2 months History of Present Illness (HPI) The following HPI elements were documented for the patient's wound: Location: ulcers on the plantar aspect of both feet for over a year and a half Quality: Patient reports No Pain. Severity: Patient  states wound are getting worse. Duration: Patient has had the wound for > 18 months prior to seeking treatment at the wound center Context: The wound appeared gradually over time Modifying Factors: Other treatment(s) tried include: his podiatrist has been treating him with Regranex and he has been seen at the [facility] wound care center and treated with surgical debridement, antibiotics and total contact cast. Associated Signs and Symptoms: Patient reports having increase discharge. This is a 50 year old man who comes in for review of wounds on his bilateral plantar feet which are been present for one and a half year. he has been seen at the [facility] wound care center by Dr. Leanord Hawking and was treated there for a couple of months and I'm not entirely sure why he changed to this center. He says it was difficult for him to get an appointment due to shortage of staff. The last time around he had a total contact cast which he cut himself because he was unable to go back to [place]. He has severe diabetic neuropathy with lower extremity pain. He has had a wound over his left plantar first metatarsal head and a wound on the right foot at roughly the second and third plantar metatarsal heads. He has a Darco sandal on the left and a healing sandal on the right. For roughly the last 8 months. He has been using Regranex and this was been followed by his podiatrist at [place] [facility] He has had multiple x-rays of his feet according to the patient that have not shown infection. He has not had an MRI. He was treated with doxycycline and Septra in the past for a staph aureus infection. He has never been a smoker. he  goes to a pain clinic for pain medications and is regularly seen for his primary care needs by his PCP. 03/21/2015 -- the patient failed to keep his appointment for a cast change on Monday because of transportation issues. He said by Tuesday he managed to go to the  local hospital and get the cast open. He has an abrasion on the dorsum of his right foot and this may have been caused by the cast rubbing on his [name], [name] ([id number]) foot. His blood sugars have been okay. 03/28/2015 -- he agrees to have a TCC applied to his right lower extremity and be again discussed getting a DH walking boot for his left low lower extremity. 04/04/2015 -- the patient says he cannot tolerate the total contact cast as his feet sweat a lot and he has a terrible odor and he just cannot live with his total contact cast. He refuses to have another one applied but this time around agrees that he will buy a DH walking boot 04/25/2015 -- as noted above the patient continues to be noncompliant has not come for over 2 weeks and says he has problems with his transport. He also says that he has been unable to buy his DH walking boot because of monetary issues and he cannot tolerate the total contact cast. 05/02/2015 -- the patient is here today and clearly states that he will not use the total contact cast now 20 by the [facility] walking boot as he doesn't believe he needs to. He says he is going to walk around with his tennis shoes and in the course of conversation that my nurses and me know that he has been asking his dog's to lick his wounds as they have healing properties. He is quite adamant about continuing to do this and every good [facility] on my part to try him to stop doing this he refuses to. He then went on to state that he really does not need to come to the wound center and is able to look after his wound care himself. Objective Constitutional Vitals Time Taken: 10:12 AM, Height: 72 in, Weight: 228 lbs, BMI: 30.9, Temperature: 97.5 F, Pulse: 70 bpm, Blood Pressure: 135/77 mmHg. Integumentary (Hair, Skin) Wound #1 status is Open. Original cause of wound was Pressure Injury. The wound is located on the Right,Plantar Foot. The wound measures 0.5cm length x 0.6cm width x  0.3cm depth; 0.236cm^2 area and 0.071cm^3 volume. The wound is limited to skin breakdown. There is no tunneling or undermining noted. There is a medium amount of serous drainage noted. The wound margin is flat and intact. There is large (67-100%) red, pink granulation within the wound bed. There is a small (1-33%) amount of necrotic tissue within the wound bed including Adherent Slough. The periwound skin appearance exhibited: Callus. The periwound skin appearance did not exhibit: Crepitus, Excoriation, Fluctuance, Friable, Induration, Localized Edema, Rash, Scarring, Dry/Scaly, Maceration, Moist, Atrophie Blanche, Cyanosis, Ecchymosis, Hemosiderin Staining, Mottled, Pallor, Rubor, Erythema. The periwound has tenderness on palpation. Wound #3 status is Open. Original cause of wound was Pressure Injury. The wound is located on the Left,Plantar Metatarsal head first. The wound measures 1.2cm length x 1cm width x 1cm depth; 0.942cm^2 area and 0.942cm^3 volume. The wound is limited to skin breakdown. There is no tunneling or undermining noted. There is a large amount of serosanguineous drainage noted. The wound margin is flat and intact. There is large (67-100%) red, pink granulation within the wound bed. There is a small (1-33%)  amount of necrotic tissue within the wound bed including Eschar and Adherent Slough. The periwound skin appearance exhibited: Callus. The periwound skin appearance did not exhibit: Crepitus, Excoriation, Fluctuance, Friable, Induration, Localized Edema, Rash, Scarring, Dry/Scaly, Maceration, Moist, Atrophie Blanche, Cyanosis, Ecchymosis, Hemosiderin Staining, Mottled, Pallor, Rubor, Erythema. The periwound [name], [name]. ([id number]) has tenderness on palpation. Assessment Active Problems ICD-10 E11.621 - Type 2 diabetes mellitus with foot ulcer E11.42 - Type 2 diabetes mellitus with diabetic polyneuropathy L97.512 - Non-pressure chronic ulcer of other part of right  foot with fat layer exposed L97.522 - Non-pressure chronic ulcer of other part of left foot with fat layer exposed L84 - Corns and callosities This patient has been seen for 8 visits over a two-month period and most of them are here the same excuses and the patient is noncompliant with what I have been advising him. He is always very polite but manipulative and I don't believe he is going to follow appropriate advice. I have again offered him a total contact cast or a DH walking boot but he declines. Today he clearly states that he will not follow our advice for offloading, dressing and wants to let his dog lick his wounds as he believes that is doing more benefit for him and helping him to heal his wounds. I have politely try to reason with him for a while but at the end of it the patient is very firm and being noncompliant. At this stage I have declined to see him in the wound center any longer and I have asked him to be discharged AGAINST MEDICAL ADVICE and he politely agrees. Appropriate paperwork has been done. Plan Discharge From [facility] Services: Wound #1 Right,Plantar Foot: Discharge from Wound Care Center - wound not healed. But patient is being discharged against medical advise, due to noncompliance, patient states " the saliva from my dog has healing powers when it licks the wound" Wound #3 Left,Plantar Metatarsal head first: Discharge from Wound Care Center - wound not healed. But patient is being discharged against medical advise, due to noncompliance, patient states " the saliva from my dog has healing powers when it licks the wound" [name], [name] ([id number]) This patient has been seen for 8 visits over a two-month period and most of them are here the same excuses and the patient is noncompliant with what I have been advising him. He is always very polite but manipulative and I don't believe he is going to follow appropriate advice. I have again offered him a total contact  cast or a DH walking boot but he declines. Today he clearly states that he will not follow our advice for offloading, dressing and wants to let his dog lick his wounds as he believes that is doing more benefit for him and helping him to heal his wounds. I have politely try to reason with him for a while but at the end of it the patient is very firm and being noncompliant. At this stage I have declined to see him in the wound center any longer and I have asked him to be discharged AGAINST MEDICAL ADVICE and he politely agrees. Appropriate paperwork has been done. Electronic Signature(s) Signed: 05/03/2015 3:15:44 PM By: Evlyn Kanner MD, FACS Previous Signature: 05/02/2015 11:38:46 AM Version By: Evlyn Kanner MD, FACS Entered By: Evlyn Kanner on 05/03/2015 15:15:44 [name] ([id number]) -------------------------------------------------------------------------------- SuperBill Details Patient Name: [name]. Date of Service: 05/02/2015 Medical Record Number: [id number] Patient Account Number: [id number] Date of Birth/Sex:  04-Jun-1965 (50 y.o. 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 L97.522 Non-pressure chronic ulcer of other part of left foot with fat layer exposed L84 Corns and callosities Facility Procedures CPT4 Code: [id number] Description: 99213 - WOUND CARE VISIT-LEV 3 EST PT Modifier: Quantity: 1 Physician Procedures CPT4 Code Description: [id number] - WC PHYS LEVEL 2 - EST PT ICD-10 Description Diagnosis E11.621 Type 2 diabetes mellitus with foot ulcer E11.42 Type 2 diabetes mellitus with  diabetic polyneuropath L97.512 Non-pressure chronic ulcer of other part of  right fo L97.522 Non-pressure chronic ulcer of other part of left foo Modifier: y ot with fat lay t with fat laye Quantity: 1 er exposed r exposed Electronic Signature(s) Signed: 05/02/2015 11:39:02 AM By: Evlyn Kanner MD, FACS Entered By: Evlyn Kanner on 05/02/2015 11:39:02

## 2017-06-25 ENCOUNTER — Encounter (HOSPITAL_COMMUNITY): Payer: Self-pay

## 2017-06-25 ENCOUNTER — Emergency Department (HOSPITAL_COMMUNITY)
Admission: EM | Admit: 2017-06-25 | Discharge: 2017-06-25 | Disposition: A | Discharge status: Home / Self Care | Payer: Medicaid Other | Attending: Emergency Medicine | Admitting: Emergency Medicine

## 2017-06-25 ENCOUNTER — Emergency Department (HOSPITAL_COMMUNITY): Payer: Medicaid Other

## 2017-06-25 DIAGNOSIS — Z7984 Long term (current) use of oral hypoglycemic drugs: Secondary | ICD-10-CM | POA: Insufficient documentation

## 2017-06-25 DIAGNOSIS — D649 Anemia, unspecified: Secondary | ICD-10-CM | POA: Diagnosis not present

## 2017-06-25 DIAGNOSIS — E119 Type 2 diabetes mellitus without complications: Secondary | ICD-10-CM | POA: Diagnosis not present

## 2017-06-25 DIAGNOSIS — I10 Essential (primary) hypertension: Secondary | ICD-10-CM | POA: Diagnosis not present

## 2017-06-25 DIAGNOSIS — Z79899 Other long term (current) drug therapy: Secondary | ICD-10-CM | POA: Diagnosis not present

## 2017-06-25 DIAGNOSIS — E871 Hypo-osmolality and hyponatremia: Secondary | ICD-10-CM | POA: Insufficient documentation

## 2017-06-25 DIAGNOSIS — M86172 Other acute osteomyelitis, left ankle and foot: Secondary | ICD-10-CM | POA: Diagnosis not present

## 2017-06-25 DIAGNOSIS — N2889 Other specified disorders of kidney and ureter: Secondary | ICD-10-CM | POA: Diagnosis not present

## 2017-06-25 DIAGNOSIS — N289 Disorder of kidney and ureter, unspecified: Secondary | ICD-10-CM

## 2017-06-25 DIAGNOSIS — R509 Fever, unspecified: Secondary | ICD-10-CM | POA: Diagnosis present

## 2017-06-25 HISTORY — DX: Non-pressure chronic ulcer of back limited to breakdown of skin: L98.421

## 2017-06-25 HISTORY — DX: Type 2 diabetes mellitus with other skin ulcer: E11.622

## 2017-06-25 LAB — CBC WITH DIFFERENTIAL/PLATELET
Basophils Absolute: 0 10*3/uL (ref 0.0–0.1)
Basophils Relative: 0 %
Eosinophils Absolute: 0.3 10*3/uL (ref 0.0–0.7)
Eosinophils Relative: 5 %
HCT: 25.3 % — ABNORMAL LOW (ref 39.0–52.0)
Hemoglobin: 8.5 g/dL — ABNORMAL LOW (ref 13.0–17.0)
Lymphocytes Relative: 11 %
Lymphs Abs: 0.6 10*3/uL — ABNORMAL LOW (ref 0.7–4.0)
MCH: 29.7 pg (ref 26.0–34.0)
MCHC: 33.6 g/dL (ref 30.0–36.0)
MCV: 88.5 fL (ref 78.0–100.0)
Monocytes Absolute: 0.4 10*3/uL (ref 0.1–1.0)
Monocytes Relative: 7 %
Neutro Abs: 3.9 10*3/uL (ref 1.7–7.7)
Neutrophils Relative %: 77 %
Platelets: 174 10*3/uL (ref 150–400)
RBC: 2.86 MIL/uL — ABNORMAL LOW (ref 4.22–5.81)
RDW: 13.8 % (ref 11.5–15.5)
WBC: 5.1 10*3/uL (ref 4.0–10.5)

## 2017-06-25 LAB — LACTIC ACID, PLASMA
Lactic Acid, Venous: 2.2 mmol/L (ref 0.5–1.9)
Lactic Acid, Venous: 1.3 mmol/L (ref 0.5–1.9)

## 2017-06-25 LAB — COMPREHENSIVE METABOLIC PANEL
ALT: 26 U/L (ref 17–63)
AST: 49 U/L — ABNORMAL HIGH (ref 15–41)
Albumin: 3.1 g/dL — ABNORMAL LOW (ref 3.5–5.0)
Alkaline Phosphatase: 74 U/L (ref 38–126)
Anion gap: 10 (ref 5–15)
BUN: 8 mg/dL (ref 6–20)
CO2: 26 mmol/L (ref 22–32)
Calcium: 8.4 mg/dL — ABNORMAL LOW (ref 8.9–10.3)
Chloride: 94 mmol/L — ABNORMAL LOW (ref 101–111)
Creatinine, Ser: 1.28 mg/dL — ABNORMAL HIGH (ref 0.61–1.24)
GFR calc Af Amer: >60 mL/min (ref >60)
GFR calc non Af Amer: >60 mL/min (ref >60)
Glucose, Bld: 196 mg/dL — ABNORMAL HIGH (ref 65–99)
Potassium: 3.5 mmol/L (ref 3.5–5.1)
Sodium: 130 mmol/L — ABNORMAL LOW (ref 135–145)
Total Bilirubin: 0.8 mg/dL (ref 0.3–1.2)
Total Protein: 7.3 g/dL (ref 6.5–8.1)

## 2017-06-25 LAB — URINALYSIS, ROUTINE W REFLEX MICROSCOPIC
Bilirubin Urine: NEGATIVE
Glucose, UA: NEGATIVE mg/dL
Ketones, ur: NEGATIVE mg/dL
Leukocytes, UA: NEGATIVE
Nitrite: NEGATIVE
Protein, ur: NEGATIVE mg/dL
Specific Gravity, Urine: 1.002 — ABNORMAL LOW (ref 1.005–1.030)
Squamous Epithelial / LPF: NONE SEEN
pH: 6 (ref 5.0–8.0)

## 2017-06-25 LAB — PROTIME-INR
INR: 1.95
Prothrombin Time: 22.1 seconds — ABNORMAL HIGH (ref 11.4–15.2)

## 2017-06-25 MED ORDER — CIPROFLOXACIN HCL 250 MG PO TABS
500.0000 mg | ORAL_TABLET | Freq: Once | ORAL | Status: AC
  Administered 2017-06-25: 500 mg via ORAL
  Filled 2017-06-25: qty 2

## 2017-06-25 MED ORDER — CLINDAMYCIN HCL 150 MG PO CAPS: 300.0000 mg | ORAL_CAPSULE | Freq: Three times a day (TID) | ORAL | 0 refills | Status: DC

## 2017-06-25 MED ORDER — CLINDAMYCIN PHOSPHATE 600 MG/50ML IV SOLN
600.0000 mg | Freq: Once | INTRAVENOUS | Status: AC
  Administered 2017-06-25: 600 mg via INTRAVENOUS
  Filled 2017-06-25: qty 50

## 2017-06-25 MED ORDER — CIPROFLOXACIN HCL 500 MG PO TABS: 500.0000 mg | ORAL_TABLET | Freq: Two times a day (BID) | ORAL | 0 refills | Status: DC

## 2017-06-25 NOTE — ED Triage Notes (Addendum)
Pt reports that he had diabetic ulcer removed on left foot end of feb.Pt currently has a PICC line and receiving abt for MRSA infection . Pt has been having a fever everyday at least 5 days up to 103. Pt went to infectious disease yesterday, but reports he missed his appt with surgeon. Sx was performed in [place]

## 2017-06-25 NOTE — Discharge Instructions (Signed)
Your x-ray shows that you have an infection in your bone, your blood work shows that you have anemia and low sodium levels I have recommended that you be admitted to the hospital for IV antibiotics but you have refused and are leaving against my medical advice You may return at any time should you change your mind or should your symptoms worsen including increasing pain swelling fevers redness or drainage from the wound. The consequences of not getting appropriate treatment for your infection is a potentially worsening infection which could lead to amputation and even death.   Ciprofloxacin 500 mg by mouth twice a day for 10 days, clindamycin 300 mg by mouth 3 times daily for 10 days   Please call your podiatrist to let them know your condition and to arrange close follow-up.

## 2017-06-25 NOTE — ED Notes (Signed)
Patient upset and ready to go home. Patient states that he has been here for three hours. Dr Hyacinth Meeker is room.

## 2017-06-25 NOTE — ED Provider Notes (Signed)
[facility] EMERGENCY DEPARTMENT Provider Note   CSN: [id number] Arrival date & time: 06/25/17  1718     History   Chief Complaint Chief Complaint  Patient presents with  . Fever    HPI [name] is a 52 y.o. male.  HPI  The patient is a 52 year old diabetic male, also treated for high blood pressure, former smoker many years ago.  He was treated in [facility] after having a left foot surgery because of a diabetic ulcer that was removed from his left foot.  He reports that this surgery was complicated by a methicillin-resistant staphylococcal infection which ultimately required a 2-week stay in the hospital, a peripherally inserted central catheter and ongoing vancomycin therapy since discharge.  He reports that overall he has been doing well since his discharge on 28 February however he is changing the dressings twice a day and noted today that there was increased redness, increased swelling, ongoing pain.  He states that he was feeling fine but his family noticed that he felt flushed, they obtained a temperature which was 103.0 by oral route.  They bring him to the hospital with this complaint.  He denies other skin problems including other rashes, denies dysuria, diarrhea, abdominal or back pain, neck pain, headache, sore throat, cough, shortness of breath.  He has been getting his IV vancomycin twice a day which she is giving himself at home through the PICC line.  He denies any pain at the PICC line site  Past Medical History:  Diagnosis Date  . Asthma   . Diabetes mellitus   . Diabetic ulcer of back associated with type 2 diabetes mellitus, limited to breakdown of skin ([place])   . Hypertension   . Pancreatitis     There are no active problems to display for this patient.   Past Surgical History:  Procedure Laterality Date  . APPENDECTOMY    . LAPAROSCOPIC APPENDECTOMY  07/28/2011   Procedure: APPENDECTOMY LAPAROSCOPIC;  Surgeon: Donato Heinz, MD;  Location: [facility];  Service: General;  Laterality: N/A;  . LEG SURGERY         Home Medications    Prior to Admission medications   Medication Sig Start Date End Date Taking? Authorizing Provider  albuterol (PROVENTIL HFA;VENTOLIN HFA) 108 (90 BASE) MCG/ACT inhaler Inhale 2 puffs into the lungs every 6 (six) hours as needed. Asthma   Yes [provider]  amitriptyline (ELAVIL) 25 MG tablet Take 100 mg by mouth at bedtime.    Yes [provider]  Ganciclovir (ZIRGAN) 0.15 % GEL Place 1 drop into the right eye 5 (five) times daily. 06/13/17  Yes [provider]  losartan (COZAAR) 50 MG tablet Take 50 mg by mouth daily.  06/14/17  Yes [provider]  metFORMIN (GLUCOPHAGE-XR) 500 MG 24 hr tablet Take 500 mg by mouth 2 (two) times daily.    Yes [provider]  omeprazole (PRILOSEC) 20 MG capsule Take 20 mg by mouth daily.    Yes [provider]  oxyCODONE (OXYCONTIN) 10 mg 12 hr tablet Take 10 mg by mouth every 8 (eight) hours as needed.    Yes [provider]  Rivaroxaban 15 & 20 MG TBPK Take 15 mg by mouth 2 (two) times daily.  06/13/17 07/14/17 Yes [provider]  valACYclovir (VALTREX) 1000 MG tablet Take by mouth. 06/13/17  Yes [provider]  Vancomycin HCl and NaCl 1-0.9 GM-% KIT Inject 2 g into the vein every 12 (  twelve) hours.    Yes [provider]  ARIPiprazole (ABILIFY) 15 MG tablet Take 15 mg by mouth at bedtime.     [provider]  ciprofloxacin (CIPRO) 500 MG tablet Take 1 tablet (500 mg total) by mouth every 12 (twelve) hours. 06/25/17   Noemi Chapel, MD  clindamycin (CLEOCIN) 150 MG capsule Take 2 capsules (300 mg total) by mouth 3 (three) times daily. May dispense as 175m capsules 06/25/17   MNoemi Chapel MD  Fluticasone-Salmeterol (ADVAIR) 250-50 MCG/DOSE AEPB Inhale 1 puff into the lungs 2 (two) times daily as needed. Asthma    [provider]  glipiZIDE (GLUCOTROL XL) 10 MG 24 hr  tablet Take 10 mg by mouth daily with breakfast.     [provider]  PARoxetine (PAXIL-CR) 25 MG 24 hr tablet Take 25 mg by mouth every morning.      [provider]  pravastatin (PRAVACHOL) 20 MG tablet Take 20 mg by mouth daily.     [provider]  rOPINIRole (REQUIP) 0.5 MG tablet Take 0.5 mg by mouth at bedtime.     [provider]    Family History No family history on file.  Social History Social History   Tobacco Use  . Smoking status: Never Smoker  Substance Use Topics  . Alcohol use: Yes    Comment: occasionally  . Drug use: No     Allergies   Aspirin and Penicillins   Review of Systems Review of Systems  All other systems reviewed and are negative.    Physical Exam Updated Vital Signs BP 127/73 (BP Location: Left Arm)   Pulse (!) 108   Temp 98.6 F (37 C) (Oral)   Resp 18   Wt 111.1 kg (245 lb)   SpO2 100%   BMI 33.23 kg/m   Physical Exam  Constitutional: He appears well-developed and well-nourished. No distress.  HENT:  Head: Normocephalic and atraumatic.  Mouth/Throat: Oropharynx is clear and moist. No oropharyngeal exudate.  Eyes: Conjunctivae and EOM are normal. Pupils are equal, round, and reactive to light. Right eye exhibits no discharge. Left eye exhibits no discharge. No scleral icterus.  Neck: Normal range of motion. Neck supple. No JVD present. No thyromegaly present.  Cardiovascular: Regular rhythm, normal heart sounds and intact distal pulses. Exam reveals no gallop and no friction rub.  No murmur heard. Tachycardia present with  Normal radial artery pulses and pedal pulses bilaterally  Pulmonary/Chest: Effort normal and breath sounds normal. No respiratory distress. He has no wheezes. He has no rales.  Abdominal: Soft. Bowel sounds are normal. He exhibits no distension and no mass. There is no tenderness.  Musculoskeletal: Normal range of motion. He exhibits tenderness and deformity. He exhibits no  edema.  L foot deformity with swelling over the medial distal foot  Lymphadenopathy:    He has no cervical adenopathy.  Neurological: He is alert. Coordination normal.  The patient has the ability to ambulate, he ambulates with a postop shoe on his left foot, he has normal gait appropriate for his injury, follows commands without difficulty, has no disconjugate gaze, facial symmetry is normal, strength in the upper extremities is normal, speech and memory are normal.  Skin: Skin is warm and dry. No rash noted. No erythema.  The skin of the left foot is erythematous over the medial surface from the midfoot distal through the great toe.  The wounds are intact, there is no purulence expressed, there is no subcutaneous emphysema palpated, there is  significant tenderness along these wounds  Psychiatric: He has a normal mood and affect. His behavior is normal.  Nursing note and vitals reviewed.    ED Treatments / Results  Labs (all labs ordered are listed, but only abnormal results are displayed) Labs Reviewed  COMPREHENSIVE METABOLIC PANEL - Abnormal; Notable for the following components:      Result Value   Sodium 130 (*)    Chloride 94 (*)    Glucose, Bld 196 (*)    Creatinine, Ser 1.28 (*)    Calcium 8.4 (*)    Albumin 3.1 (*)    AST 49 (*)    All other components within normal limits  CBC WITH DIFFERENTIAL/PLATELET - Abnormal; Notable for the following components:   RBC 2.86 (*)    Hemoglobin 8.5 (*)    HCT 25.3 (*)    Lymphs Abs 0.6 (*)    All other components within normal limits  PROTIME-INR - Abnormal; Notable for the following components:   Prothrombin Time 22.1 (*)    All other components within normal limits  URINALYSIS, ROUTINE W REFLEX MICROSCOPIC - Abnormal; Notable for the following components:   Color, Urine STRAW (*)    Specific Gravity, Urine 1.002 (*)    Hgb urine dipstick SMALL (*)    Bacteria, UA RARE (*)    All other components within normal limits  LACTIC  ACID, PLASMA - Abnormal; Notable for the following components:   Lactic Acid, Venous 2.2 (*)    All other components within normal limits  CULTURE, BLOOD (ROUTINE X 2)  CULTURE, BLOOD (ROUTINE X 2)  LACTIC ACID, PLASMA    EKG  EKG Interpretation None       Radiology Dg Foot Complete Left  Result Date: 06/25/2017 CLINICAL DATA:  Postop wound infection. EXAM: LEFT FOOT - COMPLETE 3+ VIEW COMPARISON:  Most recent available is 10/31/2007 FINDINGS: Interval resections of the second through third metatarsal heads with hypertrophic healing. Hallux valgus with bunionectomy. Primarily chronic appearing irregularity of the great toe proximal phalanx base with spurring and bone loss. On the lateral view however the plantar aspect appears indistinctly eroded. Skin staples overlap the first MTP joint. There is a cluster of punctate densities over the plantar forefoot at the level of the metatarsal heads, age-indeterminate. No soft tissue emphysema. No acute fracture. IMPRESSION: 1. Limited specificity without recent priors. 2. Irregularity of the great toe proximal phalanx at the MTP joint, possible osteomyelitis. 3. Cluster of punctate densities on the plantar forefoot at the level of the metatarsal heads, heterotopic ossification versus age-indeterminate foreign bodies. 4. No soft tissue emphysema. Electronically Signed   By: Monte Fantasia M.D.   On: 06/25/2017 19:28    Procedures Procedures (including critical care time)  Medications Ordered in ED Medications  ciprofloxacin (CIPRO) tablet 500 mg (not administered)  clindamycin (CLEOCIN) IVPB 600 mg (not administered)     Initial Impression / Assessment and Plan / ED Course  I have reviewed the triage vital signs and the nursing notes.  Pertinent labs & imaging results that were available during my care of the patient were reviewed by me and considered in my medical decision making (see chart for details).    The patient has what appears  to be at least some cellulitis of the foot, he is not febrile here, he did not take any antipyretics prior to arrival raising the suspicion that he may not of had a fever prehospital.  The family states that he was 103.0 prehospital.  He is tachycardic, he does have some signs of infection, his PICC line in the right upper extremity appears clean with no tenderness or surrounding redness or drainage or discharge.  Labs pending  I discussed with the patient at length his findings including hyponatremia, anemia, bone infection, osteomyelitis of the foot.  I believe that the patient does have worsening infection, he is persistently tachycardic and now his heart rate is up around 110.  We talked at length regarding the need for ongoing treatment but he has absolutely refused to be admitted to the hospital.  He is willing to get some IV antibiotics here and will be discharged on Cipro and Wink[place].  He is able to tell me the potential outcomes including worsening infection, amputation or even death should this infection become worse.  He is willing to accept those risks and will sign AGAINST MEDICAL ADVICE.  Final Clinical Impressions(s) / ED Diagnoses   Final diagnoses:  Other acute osteomyelitis of left foot (HCC)  Anemia, unspecified type  Hyponatremia  Renal insufficiency    ED Discharge Orders        Ordered    clindamycin (CLEOCIN) 150 MG capsule  3 times daily     06/25/17 2133    ciprofloxacin (CIPRO) 500 MG tablet  Every 12 hours     06/25/17 2133       Noemi Chapel, MD 06/25/17 2136

## 2017-06-25 NOTE — ED Notes (Signed)
Date and time results received: 06/25/17 1942 (use smartphrase ".now" to insert current time)  Test: Lactic Acid Critical Value: 2.2  Name of Provider Notified: Hyacinth Meeker  Orders Received? Or Actions Taken?:

## 2017-07-01 LAB — CULTURE, BLOOD (ROUTINE X 2)
Culture: NO GROWTH
Culture: NO GROWTH
Special Requests: ADEQUATE

## 2017-07-09 ENCOUNTER — Encounter (HOSPITAL_COMMUNITY): Payer: Self-pay | Admitting: Emergency Medicine

## 2017-07-09 ENCOUNTER — Inpatient Hospital Stay (HOSPITAL_COMMUNITY)
Admission: EM | Admit: 2017-07-09 | Discharge: 2017-07-11 | DRG: 812 | Discharge status: Against Medical Advice | Payer: Medicaid Other | Attending: Internal Medicine | Admitting: Internal Medicine

## 2017-07-09 ENCOUNTER — Emergency Department (HOSPITAL_COMMUNITY): Payer: Medicaid Other

## 2017-07-09 ENCOUNTER — Other Ambulatory Visit: Payer: Self-pay

## 2017-07-09 DIAGNOSIS — L98421 Non-pressure chronic ulcer of back limited to breakdown of skin: Secondary | ICD-10-CM | POA: Diagnosis present

## 2017-07-09 DIAGNOSIS — L97524 Non-pressure chronic ulcer of other part of left foot with necrosis of bone: Secondary | ICD-10-CM

## 2017-07-09 DIAGNOSIS — Z8614 Personal history of Methicillin resistant Staphylococcus aureus infection: Secondary | ICD-10-CM

## 2017-07-09 DIAGNOSIS — D649 Anemia, unspecified: Secondary | ICD-10-CM | POA: Diagnosis present

## 2017-07-09 DIAGNOSIS — E11621 Type 2 diabetes mellitus with foot ulcer: Secondary | ICD-10-CM

## 2017-07-09 DIAGNOSIS — L97509 Non-pressure chronic ulcer of other part of unspecified foot with unspecified severity: Secondary | ICD-10-CM | POA: Diagnosis present

## 2017-07-09 DIAGNOSIS — E119 Type 2 diabetes mellitus without complications: Secondary | ICD-10-CM

## 2017-07-09 DIAGNOSIS — Z7951 Long term (current) use of inhaled steroids: Secondary | ICD-10-CM | POA: Diagnosis not present

## 2017-07-09 DIAGNOSIS — I8290 Acute embolism and thrombosis of unspecified vein: Secondary | ICD-10-CM | POA: Diagnosis not present

## 2017-07-09 DIAGNOSIS — T368X5A Adverse effect of other systemic antibiotics, initial encounter: Secondary | ICD-10-CM | POA: Diagnosis present

## 2017-07-09 DIAGNOSIS — G8929 Other chronic pain: Secondary | ICD-10-CM | POA: Diagnosis present

## 2017-07-09 DIAGNOSIS — Z880 Allergy status to penicillin: Secondary | ICD-10-CM

## 2017-07-09 DIAGNOSIS — E871 Hypo-osmolality and hyponatremia: Secondary | ICD-10-CM | POA: Diagnosis present

## 2017-07-09 DIAGNOSIS — M869 Osteomyelitis, unspecified: Secondary | ICD-10-CM | POA: Diagnosis present

## 2017-07-09 DIAGNOSIS — F418 Other specified anxiety disorders: Secondary | ICD-10-CM | POA: Diagnosis present

## 2017-07-09 DIAGNOSIS — N179 Acute kidney failure, unspecified: Secondary | ICD-10-CM | POA: Diagnosis present

## 2017-07-09 DIAGNOSIS — D509 Iron deficiency anemia, unspecified: Principal | ICD-10-CM | POA: Diagnosis present

## 2017-07-09 DIAGNOSIS — Z886 Allergy status to analgesic agent status: Secondary | ICD-10-CM | POA: Diagnosis not present

## 2017-07-09 DIAGNOSIS — Z7984 Long term (current) use of oral hypoglycemic drugs: Secondary | ICD-10-CM | POA: Diagnosis not present

## 2017-07-09 DIAGNOSIS — M86272 Subacute osteomyelitis, left ankle and foot: Secondary | ICD-10-CM | POA: Diagnosis present

## 2017-07-09 DIAGNOSIS — I10 Essential (primary) hypertension: Secondary | ICD-10-CM | POA: Diagnosis present

## 2017-07-09 DIAGNOSIS — E876 Hypokalemia: Secondary | ICD-10-CM | POA: Diagnosis present

## 2017-07-09 DIAGNOSIS — E1169 Type 2 diabetes mellitus with other specified complication: Secondary | ICD-10-CM | POA: Diagnosis present

## 2017-07-09 DIAGNOSIS — E11622 Type 2 diabetes mellitus with other skin ulcer: Secondary | ICD-10-CM | POA: Diagnosis present

## 2017-07-09 DIAGNOSIS — Z79899 Other long term (current) drug therapy: Secondary | ICD-10-CM

## 2017-07-09 HISTORY — DX: Type 2 diabetes mellitus with foot ulcer: L97.509

## 2017-07-09 HISTORY — DX: Type 2 diabetes mellitus with foot ulcer: E11.621

## 2017-07-09 LAB — COMPREHENSIVE METABOLIC PANEL
ALT: 11 U/L — ABNORMAL LOW (ref 17–63)
AST: 23 U/L (ref 15–41)
Albumin: 3 g/dL — ABNORMAL LOW (ref 3.5–5.0)
Alkaline Phosphatase: 66 U/L (ref 38–126)
Anion gap: 12 (ref 5–15)
BUN: 24 mg/dL — ABNORMAL HIGH (ref 6–20)
CO2: 25 mmol/L (ref 22–32)
Calcium: 8.2 mg/dL — ABNORMAL LOW (ref 8.9–10.3)
Chloride: 93 mmol/L — ABNORMAL LOW (ref 101–111)
Creatinine, Ser: 2.89 mg/dL — ABNORMAL HIGH (ref 0.61–1.24)
GFR calc Af Amer: 28 mL/min — ABNORMAL LOW (ref >60)
GFR calc non Af Amer: 24 mL/min — ABNORMAL LOW (ref >60)
Glucose, Bld: 189 mg/dL — ABNORMAL HIGH (ref 65–99)
Potassium: 3.2 mmol/L — ABNORMAL LOW (ref 3.5–5.1)
Sodium: 130 mmol/L — ABNORMAL LOW (ref 135–145)
Total Bilirubin: 0.9 mg/dL (ref 0.3–1.2)
Total Protein: 6.9 g/dL (ref 6.5–8.1)

## 2017-07-09 LAB — RETICULOCYTES
RBC.: 2.53 MIL/uL — ABNORMAL LOW (ref 4.22–5.81)
Retic Count, Absolute: 32.9 10*3/uL (ref 19.0–186.0)
Retic Ct Pct: 1.3 % (ref 0.4–3.1)

## 2017-07-09 LAB — CBC WITH DIFFERENTIAL/PLATELET
Basophils Absolute: 0 10*3/uL (ref 0.0–0.1)
Basophils Relative: 0 %
Eosinophils Absolute: 0.3 10*3/uL (ref 0.0–0.7)
Eosinophils Relative: 2 %
HCT: 21.6 % — ABNORMAL LOW (ref 39.0–52.0)
Hemoglobin: 6.9 g/dL — CL (ref 13.0–17.0)
Lymphocytes Relative: 8 %
Lymphs Abs: 0.9 10*3/uL (ref 0.7–4.0)
MCH: 28.2 pg (ref 26.0–34.0)
MCHC: 31.9 g/dL (ref 30.0–36.0)
MCV: 88.2 fL (ref 78.0–100.0)
Monocytes Absolute: 1 10*3/uL (ref 0.1–1.0)
Monocytes Relative: 9 %
Neutro Abs: 9.3 10*3/uL — ABNORMAL HIGH (ref 1.7–7.7)
Neutrophils Relative %: 81 %
Platelets: 236 10*3/uL (ref 150–400)
RBC: 2.45 MIL/uL — ABNORMAL LOW (ref 4.22–5.81)
RDW: 13.4 % (ref 11.5–15.5)
WBC: 11.5 10*3/uL — ABNORMAL HIGH (ref 4.0–10.5)

## 2017-07-09 LAB — ABO/RH: ABO/RH(D): O NEG

## 2017-07-09 LAB — PREPARE RBC (CROSSMATCH)

## 2017-07-09 LAB — LACTIC ACID, PLASMA: Lactic Acid, Venous: 1.6 mmol/L (ref 0.5–1.9)

## 2017-07-09 LAB — CBG MONITORING, ED: Glucose-Capillary: 217 mg/dL — ABNORMAL HIGH (ref 65–99)

## 2017-07-09 LAB — LIPASE, BLOOD: Lipase: 17 U/L (ref 11–51)

## 2017-07-09 LAB — GLUCOSE, CAPILLARY: Glucose-Capillary: 125 mg/dL — ABNORMAL HIGH (ref 65–99)

## 2017-07-09 MED ORDER — ONDANSETRON HCL 4 MG PO TABS: 4.0000 mg | ORAL_TABLET | Freq: Four times a day (QID) | ORAL | Status: DC | PRN

## 2017-07-09 MED ORDER — INSULIN ASPART 100 UNIT/ML ~~LOC~~ SOLN: 0.0000 [IU] | Freq: Every day | SUBCUTANEOUS | Status: DC

## 2017-07-09 MED ORDER — INSULIN ASPART 100 UNIT/ML ~~LOC~~ SOLN
0.0000 [IU] | Freq: Three times a day (TID) | SUBCUTANEOUS | Status: DC
  Administered 2017-07-10: 1 [IU] via SUBCUTANEOUS
  Administered 2017-07-10: 2 [IU] via SUBCUTANEOUS

## 2017-07-09 MED ORDER — PANTOPRAZOLE SODIUM 40 MG PO TBEC
40.0000 mg | DELAYED_RELEASE_TABLET | Freq: Every day | ORAL | Status: DC
  Administered 2017-07-10 – 2017-07-11 (×2): 40 mg via ORAL
  Filled 2017-07-09 (×2): qty 1

## 2017-07-09 MED ORDER — OXYCODONE HCL ER 10 MG PO T12A
10.0000 mg | EXTENDED_RELEASE_TABLET | Freq: Once | ORAL | Status: AC
  Administered 2017-07-09: 10 mg via ORAL
  Filled 2017-07-09: qty 1

## 2017-07-09 MED ORDER — SODIUM CHLORIDE 0.9 % IV SOLN
INTRAVENOUS | Status: DC
  Administered 2017-07-09: 20:00:00 via INTRAVENOUS

## 2017-07-09 MED ORDER — AMITRIPTYLINE HCL 25 MG PO TABS
100.0000 mg | ORAL_TABLET | Freq: Every day | ORAL | Status: DC
  Administered 2017-07-09 – 2017-07-10 (×2): 100 mg via ORAL
  Filled 2017-07-09 (×2): qty 4

## 2017-07-09 MED ORDER — LINEZOLID 600 MG/300ML IV SOLN
600.0000 mg | Freq: Two times a day (BID) | INTRAVENOUS | Status: DC
  Administered 2017-07-10 (×2): 600 mg via INTRAVENOUS
  Filled 2017-07-09 (×7): qty 300

## 2017-07-09 MED ORDER — SODIUM CHLORIDE 0.9 % IV SOLN: Freq: Once | INTRAVENOUS | Status: DC

## 2017-07-09 MED ORDER — GANCICLOVIR 0.15 % OP GEL
1.0000 [drp] | Freq: Every day | OPHTHALMIC | Status: DC
Start: 2017-07-09 — End: 2017-07-11
  Filled 2017-07-09: qty 5

## 2017-07-09 MED ORDER — OXYCODONE HCL ER 10 MG PO T12A
10.0000 mg | EXTENDED_RELEASE_TABLET | Freq: Three times a day (TID) | ORAL | Status: DC | PRN
  Administered 2017-07-10 – 2017-07-11 (×4): 10 mg via ORAL
  Filled 2017-07-09 (×4): qty 1

## 2017-07-09 MED ORDER — ONDANSETRON HCL 4 MG/2ML IJ SOLN: 4.0000 mg | Freq: Four times a day (QID) | INTRAMUSCULAR | Status: DC | PRN

## 2017-07-09 MED ORDER — ROPINIROLE HCL 1 MG PO TABS
0.5000 mg | ORAL_TABLET | Freq: Every day | ORAL | Status: DC
  Administered 2017-07-10 (×2): 0.5 mg via ORAL
  Filled 2017-07-09 (×4): qty 1

## 2017-07-09 MED ORDER — SODIUM CHLORIDE 0.9 % IV SOLN: 10.0000 mL/h | Freq: Once | INTRAVENOUS | Status: DC

## 2017-07-09 MED ORDER — HYDRALAZINE HCL 20 MG/ML IJ SOLN: 10.0000 mg | INTRAMUSCULAR | Status: DC | PRN

## 2017-07-09 MED ORDER — PAROXETINE HCL ER 12.5 MG PO TB24
25.0000 mg | ORAL_TABLET | Freq: Every day | ORAL | Status: DC
  Administered 2017-07-10 – 2017-07-11 (×2): 25 mg via ORAL
  Filled 2017-07-09 (×3): qty 1

## 2017-07-09 MED ORDER — ACETAMINOPHEN 650 MG RE SUPP: 650.0000 mg | Freq: Four times a day (QID) | RECTAL | Status: DC | PRN

## 2017-07-09 MED ORDER — MOMETASONE FURO-FORMOTEROL FUM 200-5 MCG/ACT IN AERO
2.0000 | INHALATION_SPRAY | Freq: Two times a day (BID) | RESPIRATORY_TRACT | Status: DC
  Administered 2017-07-09 – 2017-07-11 (×4): 2 via RESPIRATORY_TRACT
  Filled 2017-07-09 (×2): qty 8.8

## 2017-07-09 MED ORDER — PRAVASTATIN SODIUM 20 MG PO TABS
20.0000 mg | ORAL_TABLET | Freq: Every day | ORAL | Status: DC
  Administered 2017-07-09 – 2017-07-10 (×2): 20 mg via ORAL
  Filled 2017-07-09: qty 1
  Filled 2017-07-09: qty 2
  Filled 2017-07-09: qty 1
  Filled 2017-07-09: qty 2
  Filled 2017-07-09: qty 1

## 2017-07-09 MED ORDER — ALBUTEROL SULFATE (2.5 MG/3ML) 0.083% IN NEBU: 3.0000 mL | INHALATION_SOLUTION | Freq: Four times a day (QID) | RESPIRATORY_TRACT | Status: DC | PRN

## 2017-07-09 MED ORDER — POTASSIUM CHLORIDE IN NACL 20-0.9 MEQ/L-% IV SOLN
INTRAVENOUS | Status: AC
  Administered 2017-07-09: 22:00:00 via INTRAVENOUS

## 2017-07-09 MED ORDER — ACETAMINOPHEN 325 MG PO TABS
650.0000 mg | ORAL_TABLET | Freq: Four times a day (QID) | ORAL | Status: DC | PRN
  Administered 2017-07-10: 650 mg via ORAL
  Filled 2017-07-09: qty 2

## 2017-07-09 NOTE — H&P (Signed)
History and Physical    [name] VQQ:[id number] DOB: October 26, 1965 DOA: 07/09/2017  PCP: Abran Richard, MD   Patient coming from: Home  Chief Complaint: Lightheaded on standing, abnormal labs   HPI: [name] is a 52 y.o. male with medical history significant for type 2 diabetes mellitus, hypertension, chronic pain, depression with anxiety, recent right periorbital cellulitis complicated by ophthalmic vein thrombosis, and osteomyelitis of the left foot followed by ID and managed with vancomycin at home, now presenting to the emergency department for evaluation of abnormal blood work and lightheadedness on standing.  Patient underwent debridement of the left foot 1 month ago and has been on vancomycin since that time via PICC at home.  Blood cultures had grown MRSA last month.  He reports dark stools for approximately 1 month intermittently, but denies abdominal pain, nausea, or vomiting.  He also reports lightheadedness on standing for the past few days.  ED Course: Upon arrival to the ED, patient is found to be afebrile, saturating well on room air, tachycardic in the 110s, and with stable blood pressure.  EKG features a sinus tachycardia with rate 107.  Chemistry panel is notable for sodium 130, potassium 3.2, and creatinine 2.89, up from 1.28 earlier this month.  CBC is notable for leukocytosis to 11,500 and a normocytic anemia with hemoglobin of 6.9, down from 8.5 earlier this month and 10.5-12 last month.  Blood cultures were collected in the emergency department and 2 units of packed red blood cells were ordered for immediate transfusion.  He was started on IV fluids.  Tachycardia has improved, blood pressure remained stable, and the patient will be admitted for ongoing evaluation and management of symptomatic anemia and acute renal failure.  Review of Systems:  All other systems reviewed and apart from HPI, are negative.  Past Medical History:  Diagnosis Date  . Asthma   .  Diabetes mellitus   . 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 Hypertension   . Pancreatitis     Past Surgical History:  Procedure Laterality Date  . APPENDECTOMY    . FOOT SURGERY Left   . LAPAROSCOPIC APPENDECTOMY  07/28/2011   Procedure: APPENDECTOMY LAPAROSCOPIC;  Surgeon: Donato Heinz, MD;  Location: [facility]S;  Service: General;  Laterality: N/A;  . LEG SURGERY       reports that he has never smoked. He does not have any smokeless tobacco history on file. He reports that he drinks alcohol. He reports that he does not use drugs.  Allergies  Allergen Reactions  . Aspirin Other (See Comments)    Patient says he's a free bleeder.   . Penicillins Hives    History reviewed. No pertinent family history.   Prior to Admission medications   Medication Sig Start Date End Date Taking? Authorizing Provider  albuterol (PROVENTIL HFA;VENTOLIN HFA) 108 (90 BASE) MCG/ACT inhaler Inhale 2 puffs into the lungs every 6 (six) hours as needed. Asthma    [provider]  amitriptyline (ELAVIL) 25 MG tablet Take 100 mg by mouth at bedtime.     [provider]  ARIPiprazole (ABILIFY) 15 MG tablet Take 15 mg by mouth at bedtime.     [provider]  ciprofloxacin (CIPRO) 500 MG tablet Take 1 tablet (500 mg total) by mouth every 12 (twelve) hours. 06/25/17   Noemi Chapel, MD  clindamycin (CLEOCIN) 150 MG capsule Take 2 capsules (300 mg total) by  mouth 3 (three) times daily. May dispense as 153m capsules 06/25/17   MNoemi Chapel MD  Fluticasone-Salmeterol (ADVAIR) 250-50 MCG/DOSE AEPB Inhale 1 puff into the lungs 2 (two) times daily as needed. Asthma    [provider]  Ganciclovir (ZIRGAN) 0.15 % GEL Place 1 drop into the right eye 5 (five) times daily. 06/13/17   [provider]  glipiZIDE (GLUCOTROL XL) 10 MG 24 hr tablet Take 10 mg by mouth daily with breakfast.     [provider]    losartan (COZAAR) 50 MG tablet Take 50 mg by mouth daily.  06/14/17   [provider]  metFORMIN (GLUCOPHAGE-XR) 500 MG 24 hr tablet Take 500 mg by mouth 2 (two) times daily.     [provider]  omeprazole (PRILOSEC) 20 MG capsule Take 20 mg by mouth daily.     [provider]  oxyCODONE (OXYCONTIN) 10 mg 12 hr tablet Take 10 mg by mouth every 8 (eight) hours as needed.     [provider]  PARoxetine (PAXIL-CR) 25 MG 24 hr tablet Take 25 mg by mouth every morning.      [provider]  pravastatin (PRAVACHOL) 20 MG tablet Take 20 mg by mouth daily.     [provider]  Rivaroxaban 15 & 20 MG TBPK Take 15 mg by mouth 2 (two) times daily.  06/13/17 07/14/17  [provider]  rOPINIRole (REQUIP) 0.5 MG tablet Take 0.5 mg by mouth at bedtime.     [provider]  valACYclovir (VALTREX) 1000 MG tablet Take by mouth. 06/13/17   [provider]  Vancomycin HCl and NaCl 1-0.9 GM-% KIT Inject 2 g into the vein every 12 (twelve) hours.     [provider]    Physical Exam: Vitals:   07/09/17 1550 07/09/17 1719 07/09/17 1919 07/09/17 2000  BP:   (!) 142/81 (!) 142/75  Pulse:  (!) 114  100  Resp:  _0 Temp:    98.6 F (37 C)  TempSrc:    Oral  SpO2:  98% 97% 99%  Weight: 111.1 kg (245 lb)     Height: 6' (1.829 m)         Constitutional: NAD, calm  Eyes: PERTLA, lids and conjunctivae normal ENMT: Mucous membranes are moist. Posterior pharynx clear of any exudate or lesions.   Neck: normal, supple, no masses, no thyromegaly Respiratory: clear to auscultation bilaterally, no wheezing, no crackles. Normal respiratory effort.    Cardiovascular: Rate ~110 and regular. Trace pretibial edema. No significant JVD. Abdomen: No distension, no tenderness, no masses palpated. Bowel sounds normal.  Musculoskeletal: no clubbing / cyanosis. No joint deformity upper and lower extremities.   Skin: left foot ulcer.  Warm, dry, well-perfused. Neurologic: CN 2-12 grossly intact. Sensation intact. Strength 5/5 in all 4 limbs.  Psychiatric:  Alert and oriented x 3. Calm, cooperative.     Labs on Admission: I have personally reviewed following labs and imaging studies  CBC: Recent Labs  Lab 07/09/17 1659  WBC 11.5*  NEUTROABS 9.3*  HGB 6.9*  HCT 21.6*  MCV 88.2  PLT 2409  Basic Metabolic Panel: Recent Labs  Lab 07/09/17 1659  NA 130*  K 3.2*  CL 93*  CO2 25  GLUCOSE 189*  BUN 24*  CREATININE 2.89*  CALCIUM 8.2*   GFR: Estimated Creatinine Clearance: 39.8 mL/min (A) (by C-G formula based on SCr of 2.89 mg/dL (H)). Liver Function Tests: Recent Labs  Lab 07/09/17 1659  AST 23  ALT 11*  ALKPHOS 66  BILITOT 0.9  PROT 6.9  ALBUMIN 3.0*   Recent Labs  Lab 07/09/17 1659  LIPASE 17   No results for input(s): AMMONIA in the last 168 hours. Coagulation Profile: No results for input(s): INR, PROTIME in the last 168 hours. Cardiac Enzymes: No results for input(s): CKTOTAL, CKMB, CKMBINDEX, TROPONINI in the last 168 hours. BNP (last 3 results) No results for input(s): PROBNP in the last 8760 hours. HbA1C: No results for input(s): HGBA1C in the last 72 hours. CBG: Recent Labs  Lab 07/09/17 1557  GLUCAP 217*   Lipid Profile: No results for input(s): CHOL, HDL, LDLCALC, TRIG, CHOLHDL, LDLDIRECT in the last 72 hours. Thyroid Function Tests: No results for input(s): TSH, T4TOTAL, FREET4, T3FREE, THYROIDAB in the last 72 hours. Anemia Panel: Recent Labs    07/09/17 1928  RETICCTPCT 1.3   Urine analysis:    Component Value Date/Time   COLORURINE STRAW (A) 06/25/2017 1940   APPEARANCEUR CLEAR 06/25/2017 1940   LABSPEC 1.002 (L) 06/25/2017 1940   PHURINE 6.0 06/25/2017 1940   GLUCOSEU NEGATIVE 06/25/2017 1940   HGBUR SMALL (A) 06/25/2017 1940   BILIRUBINUR NEGATIVE 06/25/2017 1940   KETONESUR NEGATIVE 06/25/2017 1940   PROTEINUR NEGATIVE 06/25/2017 1940   UROBILINOGEN  1.0 07/28/2011 1147   NITRITE NEGATIVE 06/25/2017 1940   LEUKOCYTESUR NEGATIVE 06/25/2017 1940   Sepsis Labs: _0 (procalcitonin:4,lacticidven:4) ) Recent Results (from the past 240 hour(s))  Culture, blood (Routine X 2) w Reflex to ID Panel     Status: None (Preliminary result)   Collection Time: 07/09/17  4:59 PM  Result Value Ref Range Status   Specimen Description RIGHT ANTECUBITAL  Final   Special Requests   Final    BOTTLES DRAWN AEROBIC AND ANAEROBIC Blood Culture adequate volume Performed at [facility], [address], [place], [place] [address]    Culture PENDING  Incomplete   Report Status PENDING  Incomplete  Culture, blood (Routine X 2) w Reflex to ID Panel     Status: None (Preliminary result)   Collection Time: 07/09/17  4:59 PM  Result Value Ref Range Status   Specimen Description LEFT ANTECUBITAL  Final   Special Requests   Final    BOTTLES DRAWN AEROBIC AND ANAEROBIC Blood Culture adequate volume Performed at [facility], [address] [place], [place] [address]    Culture PENDING  Incomplete   Report Status PENDING  Incomplete     Radiological Exams on Admission: Dg Foot Complete Left  Result Date: 07/09/2017 CLINICAL DATA:  Wounds on LEFT foot, history of osteomyelitis and prior debridement, MRSA diabetic ulcer 06/09/2017 EXAM: LEFT FOOT - COMPLETE 3+ VIEW COMPARISON:  06/25/2017 FINDINGS: Osseous demineralization. Progressive irregularity and cortical loss at the medial margin of the first metatarsal head highly suspicious for osteomyelitis. Again identified destructive changes at the base of the proximal phalanx of the great toe. Deformity question resections involving the heads of the second through fourth metatarsals with additional deformity/destruction of the fifth metatarsal head. No acute fracture or dislocation. Small plantar calcaneal spur. Prior hallux valgus repair and bunionectomy. Tiny radiopacities question calcifications at the plantar soft  tissues at the level of the first metatarsal head unchanged. IMPRESSION: Chronic changes of the second through fifth metatarsal heads. Chronic destruction at the base of the proximal phalanx great toe. Increased destruction and cortical loss at the medial margin of the distal first metatarsal highly suspicious for osteomyelitis. Above changes could be  better assessed by MR imaging. Electronically Signed   By: Lavonia Dana M.D.   On: 07/09/2017 19:57    EKG: Independently reviewed. Sinus tachycardia (rate 107), RSR' in V1.   Assessment/Plan   1. Symptomatic anemia  - Presents for eval of abnormal outpatient labs and reports lightheadedness on standing for a few days  - Hgb is 6.9 on admission, down from 8.5 two weeks ago and 10.5-12 in February 2019  - Reports mild intermittent constipation and dark stool for about a month  - 2 units RBC's ordered from ED  - Add anemia panel to pre-transfusion labs, check FOBT  - Hold Xarelto, check post-transfusion CBC    2. Acute kidney injury  - SCr is 2.89 on admission, up from 1.28 earlier this month and 0.7 last month  - Possible etiologies include prerenal azotemia related to anemia, medications including vanc and losartan, or combination  - Check renal US, urine chemistries  - Continue IVF hydration, renally-dose medications, avoid nephrotoxins, repeat chem panel in am    3. Osteomyelitis left foot  - Follows with ID and managed with 6-wk course of vancomycin at home via PICC  - Status-post debridement 06/12/17 - Blood cultures from 1 month ago with MRSA  - Blood cultures repeated in ED - Continue treatment with linezolid for now given AKI    4. Right ophthalmic vein thrombosis  - Noted in February 2019 and suspected to be complication of periorbital cellulitis that has since resolved  - Xarelto held on admission while evaluating symptomatic anemia   5. Hypertension  - BP at goal  - Hold losartan in light of AKI    6. Type II DM  - A1c was  6.9% in February 2019  - Managed at home with glipizide and metformin, held on admission  - Check CBG's and use SSI with Novolog while in hospital    7. Depression, anxiety  - Stable, continue Paxil and Elavil    8. Chronic pain  - Stable, continue home-regimen with oxycodone    9. Hypokalemia  - Serum potassium is 3.2 on admission  - KCl added to IVF, repeat chem panel in am    10. Hyponatremia  - Serum sodium is 130 on admission in setting of hypovolemia  - Continue IVF hydration and repeat chem panel in am     DVT prophylaxis: SCD's  Code Status: Full  Family Communication: Discussed with patient Consults called: None Admission status: Inpatient    Vianne Bulls, MD [facility] Pager [phone]  If 7PM-7AM, please contact night-coverage [email or web address] Password [email or web address]  07/09/2017, 8:16 PM

## 2017-07-09 NOTE — ED Notes (Signed)
Given meal tray.

## 2017-07-09 NOTE — ED Triage Notes (Addendum)
Patient sent to ER by home health nurse and infectious control doctor for possible kidney failure. Per patient blood work drawn by home health care nurse yesterday, told abnormal values today-unsure of actual results. Note given to family from home health nurse to give to ER staff with concerns. On note is list of concerns-low potassium, muscle spasms, spacticity, sunburn like rash all over. BLE rash, confusion, dropping things , forgetful, sleepwalking, temp 101.2, falls, PICC sluggish. Patient currently taking Vancomycin through PICC. Patient had debridement due to MRSA/diabetic ulcer on 06/09/17.

## 2017-07-09 NOTE — ED Provider Notes (Addendum)
[facility] EMERGENCY DEPARTMENT Provider Note   CSN: [id number] Arrival date & time: 07/09/17  1527     History   Chief Complaint Chief Complaint  Patient presents with  . Abnormal Lab    HPI [name] is a 52 y.o. male.  Patient followed in [place] by infectious disease for osteomyelitis of the left foot.  Has a PICC line and receives IV vancomycin.  Patient was seen here March 8 for the first time had labs done with some concerns admission was recommended patient refused and left AMA.  Based on this however patient was started on Cipro and clindamycin.  Today home nurse got labs back that were significantly abnormal we do not have those to look at but patient was referred in based on these labs.  Patient without any specific complaints however.  Patient states that infection to his left foot is status quo.  Patient did have x-rays of his left foot when he was here on March 8 which did confirm the osteomyelitis.     Past Medical History:  Diagnosis Date  . Asthma   . Diabetes mellitus   . 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 Hypertension   . Pancreatitis     There are no active problems to display for this patient.   Past Surgical History:  Procedure Laterality Date  . APPENDECTOMY    . FOOT SURGERY Left   . LAPAROSCOPIC APPENDECTOMY  07/28/2011   Procedure: APPENDECTOMY LAPAROSCOPIC;  Surgeon: Donato Heinz, MD;  Location: [facility];  Service: General;  Laterality: N/A;  . LEG SURGERY          Home Medications    Prior to Admission medications   Medication Sig Start Date End Date Taking? Authorizing Provider  albuterol (PROVENTIL HFA;VENTOLIN HFA) 108 (90 BASE) MCG/ACT inhaler Inhale 2 puffs into the lungs every 6 (six) hours as needed. Asthma    [provider]  amitriptyline (ELAVIL) 25 MG tablet Take 100 mg by mouth at bedtime.     [provider]  ARIPiprazole  (ABILIFY) 15 MG tablet Take 15 mg by mouth at bedtime.     [provider]  ciprofloxacin (CIPRO) 500 MG tablet Take 1 tablet (500 mg total) by mouth every 12 (twelve) hours. 06/25/17   Noemi Chapel, MD  clindamycin (CLEOCIN) 150 MG capsule Take 2 capsules (300 mg total) by mouth 3 (three) times daily. May dispense as '150mg'$  capsules 06/25/17   Noemi Chapel, MD  Fluticasone-Salmeterol (ADVAIR) 250-50 MCG/DOSE AEPB Inhale 1 puff into the lungs 2 (two) times daily as needed. Asthma    [provider]  Ganciclovir (ZIRGAN) 0.15 % GEL Place 1 drop into the right eye 5 (five) times daily. 06/13/17   [provider]  glipiZIDE (GLUCOTROL XL) 10 MG 24 hr tablet Take 10 mg by mouth daily with breakfast.     [provider]  losartan (COZAAR) 50 MG tablet Take 50 mg by mouth daily.  06/14/17   [provider]  metFORMIN (GLUCOPHAGE-XR) 500 MG 24 hr tablet Take 500 mg by mouth 2 (two) times daily.     [provider]  omeprazole (PRILOSEC) 20 MG capsule Take 20 mg by mouth daily.     [provider]  oxyCODONE (OXYCONTIN) 10 mg 12 hr tablet Take 10 mg by mouth every 8 (eight) hours as needed.     [provider]  PARoxetine (  PAXIL-CR) 25 MG 24 hr tablet Take 25 mg by mouth every morning.      [provider]  pravastatin (PRAVACHOL) 20 MG tablet Take 20 mg by mouth daily.     [provider]  Rivaroxaban 15 & 20 MG TBPK Take 15 mg by mouth 2 (two) times daily.  06/13/17 07/14/17  [provider]  rOPINIRole (REQUIP) 0.5 MG tablet Take 0.5 mg by mouth at bedtime.     [provider]  valACYclovir (VALTREX) 1000 MG tablet Take by mouth. 06/13/17   [provider]  Vancomycin HCl and NaCl 1-0.9 GM-% KIT Inject 2 g into the vein every 12 (twelve) hours.     [provider]    Family History No family history on file.  Social History Social History   Tobacco Use  . Smoking status: Never  Smoker  Substance Use Topics  . Alcohol use: Yes    Comment: occasionally  . Drug use: No     Allergies   Aspirin and Penicillins   Review of Systems Review of Systems  Constitutional: Negative for fever.  HENT: Negative for congestion.   Eyes: Negative for redness.  Respiratory: Negative for shortness of breath.   Cardiovascular: Negative for chest pain.  Gastrointestinal: Negative for abdominal pain.  Genitourinary: Negative for dysuria.  Musculoskeletal: Negative for myalgias.  Skin: Positive for wound.  Neurological: Negative for headaches.  Hematological: Does not bruise/bleed easily.  Psychiatric/Behavioral: Negative for confusion.     Physical Exam Updated Vital Signs BP (!) 142/81   Pulse (!) 114   Temp 98.8 F (37.1 C) (Oral)   Resp 17   Ht 1.829 m (6')   Wt 111.1 kg (245 lb)   SpO2 97%   BMI 33.23 kg/m   Physical Exam  Constitutional: He is oriented to person, place, and time. He appears well-developed and well-nourished. No distress.  HENT:  Head: Normocephalic and atraumatic.  Mouth/Throat: Oropharynx is clear and moist.  Eyes: Pupils are equal, round, and reactive to light. EOM are normal.  Neck: Normal range of motion. Neck supple.  Cardiovascular: Normal rate and regular rhythm.  Pulmonary/Chest: Effort normal and breath sounds normal.  Abdominal: Soft. Bowel sounds are normal. There is no tenderness.  Musculoskeletal: Normal range of motion.  PICC line right upper arm.  Some redness to both lower extremities.  Left foot where he has the osteo-is bandaged.  Neurological: He is alert and oriented to person, place, and time. No cranial nerve deficit or sensory deficit. He exhibits normal muscle tone. Coordination normal.  Nursing note and vitals reviewed.    ED Treatments / Results  Labs (all labs ordered are listed, but only abnormal results are displayed) Labs Reviewed  COMPREHENSIVE METABOLIC PANEL - Abnormal; Notable for the following  components:      Result Value   Sodium 130 (*)    Potassium 3.2 (*)    Chloride 93 (*)    Glucose, Bld 189 (*)    BUN 24 (*)    Creatinine, Ser 2.89 (*)    Calcium 8.2 (*)    Albumin 3.0 (*)    ALT 11 (*)    GFR calc non Af Amer 24 (*)    GFR calc Af Amer 28 (*)    All other components within normal limits  CBC WITH DIFFERENTIAL/PLATELET - Abnormal; Notable for the following components:   WBC 11.5 (*)    RBC 2.45 (*)    Hemoglobin 6.9 (*)  HCT 21.6 (*)    Neutro Abs 9.3 (*)    All other components within normal limits  CBG MONITORING, ED - Abnormal; Notable for the following components:   Glucose-Capillary 217 (*)    All other components within normal limits  CULTURE, BLOOD (ROUTINE X 2)  CULTURE, BLOOD (ROUTINE X 2)  LIPASE, BLOOD  LACTIC ACID, PLASMA  TYPE AND SCREEN  PREPARE RBC (CROSSMATCH)  ABO/RH    EKG None   ED ECG REPORT   Date: 07/13/2017  Rate: 107  Rhythm: sinus tachycardia  QRS Axis: normal  Intervals: normal  ST/T Wave abnormalities: normal  Conduction Disutrbances:none  Narrative Interpretation:   Old EKG Reviewed: none available  I have personally reviewed the EKG tracing and agree with the computerized printout as noted.   Radiology No results found.  Procedures Procedures (including critical care time)  CRITICAL CARE Performed by: Keeanna Villafranca Total critical care time: 30 minutes Critical care time was exclusive of separately billable procedures and treating other patients. Critical care was necessary to treat or prevent imminent or life-threatening deterioration. Critical care was time spent personally by me on the following activities: development of treatment plan with patient and/or surrogate as well as nursing, discussions with consultants, evaluation of patient's response to treatment, examination of patient, obtaining history from patient or surrogate, ordering and performing treatments and interventions, ordering and review  of laboratory studies, ordering and review of radiographic studies, pulse oximetry and re-evaluation of patient's condition.    Medications Ordered in ED Medications  0.9 %  sodium chloride infusion (has no administration in time range)  0.9 %  sodium chloride infusion (has no administration in time range)  oxyCODONE (OXYCONTIN) 12 hr tablet 10 mg (has no administration in time range)     Initial Impression / Assessment and Plan / ED Course  I have reviewed the triage vital signs and the nursing notes.  Pertinent labs & imaging results that were available during my care of the patient were reviewed by me and considered in my medical decision making (see chart for details).    Patient sent in by home nurse for abnormal labs.  Here today there is evidence of acute kidney injury with significant change in BUN and creatinine.  Also has significant anemia requiring blood transfusion.  2 units of packed red blood cells ordered.  Labs otherwise without significant changes.  X-ray ordered to help the admitting team of the left foot where he is known to have osteomyelitis.  Patient had a x-ray of his left foot done on March 8.  And this will be beneficial as comparison.   Patient was seen on March 8 was recommended for admission at that time but he refused and signed out AMA.  Patient will be admitted by the hospitalist service.   Final Clinical Impressions(s) / ED Diagnoses   Final diagnoses:  Symptomatic anemia  AKI (acute kidney injury) ([place])  Osteomyelitis of left foot, unspecified type [facility])    ED Discharge Orders    None       Fredia Sorrow, MD 07/09/17 1929    Fredia Sorrow, MD 07/13/17 [phone]

## 2017-07-09 NOTE — ED Notes (Signed)
Date and time results received: 07/09/17 6:11 PM  (use smartphrase ".now" to insert current time)  Test: Hgb Critical Value: 6.9  Name of Provider Notified: Ruthy Dick  Orders Received? Or Actions Taken?: Orders Received - See Orders for details

## 2017-07-10 ENCOUNTER — Inpatient Hospital Stay (HOSPITAL_COMMUNITY): Payer: Medicaid Other

## 2017-07-10 LAB — TYPE AND SCREEN
ABO/RH(D): O NEG
Antibody Screen: NEGATIVE
Unit division: 0
Unit division: 0

## 2017-07-10 LAB — BASIC METABOLIC PANEL
Anion gap: 11 (ref 5–15)
BUN: 23 mg/dL — ABNORMAL HIGH (ref 6–20)
CO2: 25 mmol/L (ref 22–32)
Calcium: 8.1 mg/dL — ABNORMAL LOW (ref 8.9–10.3)
Chloride: 99 mmol/L — ABNORMAL LOW (ref 101–111)
Creatinine, Ser: 2.56 mg/dL — ABNORMAL HIGH (ref 0.61–1.24)
GFR calc Af Amer: 32 mL/min — ABNORMAL LOW (ref >60)
GFR calc non Af Amer: 28 mL/min — ABNORMAL LOW (ref >60)
Glucose, Bld: 180 mg/dL — ABNORMAL HIGH (ref 65–99)
Potassium: 3 mmol/L — ABNORMAL LOW (ref 3.5–5.1)
Sodium: 135 mmol/L (ref 135–145)

## 2017-07-10 LAB — GLUCOSE, CAPILLARY
Glucose-Capillary: 140 mg/dL — ABNORMAL HIGH (ref 65–99)
Glucose-Capillary: 193 mg/dL — ABNORMAL HIGH (ref 65–99)
Glucose-Capillary: 85 mg/dL (ref 65–99)
Glucose-Capillary: 140 mg/dL — ABNORMAL HIGH (ref 65–99)

## 2017-07-10 LAB — CBC WITH DIFFERENTIAL/PLATELET
Basophils Absolute: 0 10*3/uL (ref 0.0–0.1)
Basophils Relative: 0 %
Eosinophils Absolute: 0.5 10*3/uL (ref 0.0–0.7)
Eosinophils Relative: 6 %
HCT: 23.8 % — ABNORMAL LOW (ref 39.0–52.0)
Hemoglobin: 8.1 g/dL — ABNORMAL LOW (ref 13.0–17.0)
Lymphocytes Relative: 10 %
Lymphs Abs: 0.9 10*3/uL (ref 0.7–4.0)
MCH: 29.2 pg (ref 26.0–34.0)
MCHC: 34 g/dL (ref 30.0–36.0)
MCV: 85.9 fL (ref 78.0–100.0)
Monocytes Absolute: 0.9 10*3/uL (ref 0.1–1.0)
Monocytes Relative: 11 %
Neutro Abs: 6.1 10*3/uL (ref 1.7–7.7)
Neutrophils Relative %: 73 %
Platelets: 205 10*3/uL (ref 150–400)
RBC: 2.77 MIL/uL — ABNORMAL LOW (ref 4.22–5.81)
RDW: 14 % (ref 11.5–15.5)
WBC: 8.4 10*3/uL (ref 4.0–10.5)

## 2017-07-10 LAB — BPAM RBC
Blood Product Expiration Date: 201904062359
Blood Product Expiration Date: 201904122359
ISSUE DATE / TIME: 201903221955
ISSUE DATE / TIME: 201903222341
Unit Type and Rh: 9500
Unit Type and Rh: 9500

## 2017-07-10 LAB — CK: Total CK: 97 U/L (ref 49–397)

## 2017-07-10 LAB — IRON AND TIBC
Iron: 12 ug/dL — ABNORMAL LOW (ref 45–182)
Saturation Ratios: 5 % — ABNORMAL LOW (ref 17.9–39.5)
TIBC: 218 ug/dL — ABNORMAL LOW (ref 250–450)
UIBC: 206 ug/dL

## 2017-07-10 LAB — CREATININE, URINE, RANDOM: Creatinine, Urine: 30.44 mg/dL

## 2017-07-10 LAB — FOLATE: Folate: 28.8 ng/mL (ref >5.9)

## 2017-07-10 LAB — SODIUM, URINE, RANDOM: Sodium, Ur: 34 mmol/L

## 2017-07-10 LAB — FERRITIN: Ferritin: 521 ng/mL — ABNORMAL HIGH (ref 24–336)

## 2017-07-10 LAB — VITAMIN B12: Vitamin B-12: 563 pg/mL (ref 180–914)

## 2017-07-10 MED ORDER — POTASSIUM CHLORIDE CRYS ER 20 MEQ PO TBCR
40.0000 meq | EXTENDED_RELEASE_TABLET | ORAL | Status: AC
  Administered 2017-07-10 (×2): 40 meq via ORAL
  Filled 2017-07-10 (×2): qty 2

## 2017-07-10 MED ORDER — SODIUM CHLORIDE 0.9 % IV SOLN
8.0000 mg/kg | INTRAVENOUS | Status: DC
  Administered 2017-07-10: 897.5 mg via INTRAVENOUS
  Filled 2017-07-10: qty 17.95

## 2017-07-10 MED ORDER — DAPTOMYCIN 500 MG IV SOLR
INTRAVENOUS | Status: AC
  Filled 2017-07-10: qty 20

## 2017-07-10 MED ORDER — POTASSIUM CHLORIDE IN NACL 20-0.9 MEQ/L-% IV SOLN
INTRAVENOUS | Status: DC
  Administered 2017-07-10 – 2017-07-11 (×2): via INTRAVENOUS

## 2017-07-10 NOTE — Progress Notes (Signed)
PROGRESS NOTE    [name]  NWG:[id number] DOB: [birth year] DOA: 07/09/2017 PCP: Alvina Filbert, MD    Brief Narrative:  52 year old male with a history of type 2 diabetes, hypertension, recent admission at [facility] for right periorbital cellulitis complicated by abdominal pain thrombosis, osteomyelitis of the left foot and MRSA bacteremia.  He was discharged home with a 6 weeks course of IV vancomycin followed by infectious disease clinic at [facility].  He presents to the hospital with dark stools, lightheadedness and generalized weakness.  Found to be significantly anemic with a hemoglobin of 6.9.  He was transfused 2 units of PRBC.  He does not have any gross evidence of bleeding.  Stool for occult blood has been ordered.  Anticoagulation currently on hold due to concerns of bleeding.  He was also noted to have acute kidney injury with a creatinine of 2.8.  This is likely related to vancomycin as well as ARB.  Both of these medications have been discontinued he is been started on daptomycin for antibiotic coverage.   Assessment & Plan:   Principal Problem:   Symptomatic anemia Active Problems:   Diabetes mellitus type II, non insulin dependent (HCC)   Diabetic foot ulcer (HCC)   Osteomyelitis of left foot (HCC)   Hypokalemia   Hyponatremia   AKI (acute kidney injury) (HCC)   Essential hypertension   Depression with anxiety   Venous thrombosis   1. Symptomatic anemia.  Anemia panel indicates iron deficiency.  He has not noticed any gross bleeding.  Stool for FOBT has been ordered, but is not had a bowel movement as of yet.  He does report dark stool for the past several days.  He received 2 units of PRBC with improvement of hemoglobin.  Will recheck in a.m.  Anticoagulation currently on hold due to risk of bleeding. 2. Acute kidney injury.  Creatinine elevated on admission, likely related to vancomycin.  Renal ultrasound does not show any evidence of  hydronephrosis.  Patient was also taking losartan which is currently on hold.  Continue on IV fluids.  Vancomycin has been discontinued.  Monitor urine output. 3. Osteomyelitis of left foot and recent MRSA bacteremia.  Followed by infectious disease clinic at [facility].  He is on a 6-week course of intravenous antibiotics, said to be completed on 4/3.  Since vancomycin has been discontinued, case was discussed with Dr. Ilsa Iha on-call for infectious disease who recommended that patient be started on daptomycin for the remainder of his antibiotic course.  He will need to follow-up with his primary infectious disease specialist.  Follow-up with his primary podiatrist.  He does not appear septic or toxic at this time. 4. Right abdominal vein thrombosis.  This occurred in the setting of right periorbital cellulitis.  Patient was started on Xarelto, but this is on hold due to concern for underlying bleeding.  If bleeding can be effectively ruled out, would be reasonable to restart anticoagulation. 5. Hypertension.  Losartan currently on hold.  Blood pressure stable. 6. Diabetes.  Glipizide and metformin currently on hold.  Monitor on sliding scale insulin while in the hospital.  Recent A1c of 6.9.   DVT prophylaxis: SCDs Code Status: Full code Family Communication: No family present.  I asked patient if he wanted me to contact his wife, patient declined Disposition Plan: Discharge home once improved   Consultants:   Dr. Drue Second, infectious disease, telephone call  Procedures:     Antimicrobials:   Vancomycin, discontinued  due to renal failure  Daptomycin 3/23 >   Subjective: Feeling better after transfusion of PRBC.  Denies any hematuria.  He has not noticed any gross blood in stool.  Objective: Vitals:   07/10/17 0330 07/10/17 0534 07/10/17 1021 07/10/17 1500  BP: 136/73 132/83  (!) 154/89  Pulse: (!) 106 (!) 103  100  Resp: 18   20  Temp: 98.7 F (37.1 C) 98.4 F (36.9  C)  98.3 F (36.8 C)  TempSrc: Oral Oral  Oral  SpO2: 98% 96% 97% 98%  Weight:  112.2 kg (247 lb 5.7 oz)    Height:        Intake/Output Summary (Last 24 hours) at 07/10/2017 1920 Last data filed at 07/10/2017 1400 Gross per 24 hour  Intake 1868.67 ml  Output 1400 ml  Net 468.67 ml   Filed Weights   07/09/17 1550 07/09/17 2139 07/10/17 0534  Weight: 111.1 kg (245 lb) 111.1 kg (245 lb) 112.2 kg (247 lb 5.7 oz)    Examination:  General exam: Appears calm and comfortable  Respiratory system: Clear to auscultation. Respiratory effort normal. Cardiovascular system: S1 & S2 heard, RRR. No JVD, murmurs, rubs, gallops or clicks. No pedal edema. Gastrointestinal system: Abdomen is nondistended, soft and nontender. No organomegaly or masses felt. Normal bowel sounds heard. Central nervous system: Alert and oriented. No focal neurological deficits. Extremities: Symmetric 5 x 5 power. Skin: Incisions at base of left great toe without surrounding erythema, appear to be healing.  No discharge noted. Psychiatry: Judgement and insight appear normal. Mood & affect appropriate.     Data Reviewed: I have personally reviewed following labs and imaging studies  CBC: Recent Labs  Lab 07/09/17 1659 07/10/17 0559  WBC 11.5* 8.4  NEUTROABS 9.3* 6.1  HGB 6.9* 8.1*  HCT 21.6* 23.8*  MCV 88.2 85.9  PLT 236 205   Basic Metabolic Panel: Recent Labs  Lab 07/09/17 1659 07/10/17 0559  NA 130* 135  K 3.2* 3.0*  CL 93* 99*  CO2 25 25  GLUCOSE 189* 180*  BUN 24* 23*  CREATININE 2.89* 2.56*  CALCIUM 8.2* 8.1*   GFR: Estimated Creatinine Clearance: 45.1 mL/min (A) (by C-G formula based on SCr of 2.56 mg/dL (H)). Liver Function Tests: Recent Labs  Lab 07/09/17 1659  AST 23  ALT 11*  ALKPHOS 66  BILITOT 0.9  PROT 6.9  ALBUMIN 3.0*   Recent Labs  Lab 07/09/17 1659  LIPASE 17   No results for input(s): AMMONIA in the last 168 hours. Coagulation Profile: No results for input(s):  INR, PROTIME in the last 168 hours. Cardiac Enzymes: Recent Labs  Lab 07/10/17 0559  CKTOTAL 97   BNP (last 3 results) No results for input(s): PROBNP in the last 8760 hours. HbA1C: No results for input(s): HGBA1C in the last 72 hours. CBG: Recent Labs  Lab 07/09/17 1557 07/09/17 2131 07/10/17 0801 07/10/17 1128 07/10/17 1717  GLUCAP 217* 125* 140* 193* 85   Lipid Profile: No results for input(s): CHOL, HDL, LDLCALC, TRIG, CHOLHDL, LDLDIRECT in the last 72 hours. Thyroid Function Tests: No results for input(s): TSH, T4TOTAL, FREET4, T3FREE, THYROIDAB in the last 72 hours. Anemia Panel: Recent Labs    07/09/17 1927 07/09/17 1928 07/09/17 1936  VITAMINB12 563  --   --   FOLATE  --   --  28.8  FERRITIN 521*  --   --   TIBC 218*  --   --   IRON 12*  --   --  RETICCTPCT  --  1.3  --    Sepsis Labs: Recent Labs  Lab 07/09/17 1659  LATICACIDVEN 1.6    Recent Results (from the past 240 hour(s))  Culture, blood (Routine X 2) w Reflex to ID Panel     Status: None (Preliminary result)   Collection Time: 07/09/17  4:59 PM  Result Value Ref Range Status   Specimen Description RIGHT ANTECUBITAL  Final   Special Requests   Final    BOTTLES DRAWN AEROBIC AND ANAEROBIC Blood Culture adequate volume   Culture   Final    NO GROWTH < 24 HOURS Performed at [facility], [address], [place], [place] [address]    Report Status PENDING  Incomplete  Culture, blood (Routine X 2) w Reflex to ID Panel     Status: None (Preliminary result)   Collection Time: 07/09/17  4:59 PM  Result Value Ref Range Status   Specimen Description LEFT ANTECUBITAL  Final   Special Requests   Final    BOTTLES DRAWN AEROBIC AND ANAEROBIC Blood Culture adequate volume   Culture   Final    NO GROWTH < 24 HOURS Performed at [facility], [address] [place], [place] [address]    Report Status PENDING  Incomplete         Radiology Studies: US Renal  Result Date: 07/10/2017 CLINICAL  DATA:  Acute renal failure. EXAM: RENAL / URINARY TRACT ULTRASOUND COMPLETE COMPARISON:  Abdomen and pelvis CT dated 07/28/2011. FINDINGS: Right Kidney: Length: 12.8 cm. Echogenicity within normal limits. No mass or hydronephrosis visualized. Left Kidney: Length: 14.0 cm. Echogenicity within normal limits. No mass or hydronephrosis visualized. Bladder: Appears normal for degree of bladder distention. IMPRESSION: Normal examination. Electronically Signed   By: Beckie Salts M.D.   On: 07/10/2017 10:24   Dg Foot Complete Left  Result Date: 07/09/2017 CLINICAL DATA:  Wounds on LEFT foot, history of osteomyelitis and prior debridement, MRSA diabetic ulcer 06/09/2017 EXAM: LEFT FOOT - COMPLETE 3+ VIEW COMPARISON:  06/25/2017 FINDINGS: Osseous demineralization. Progressive irregularity and cortical loss at the medial margin of the first metatarsal head highly suspicious for osteomyelitis. Again identified destructive changes at the base of the proximal phalanx of the great toe. Deformity question resections involving the heads of the second through fourth metatarsals with additional deformity/destruction of the fifth metatarsal head. No acute fracture or dislocation. Small plantar calcaneal spur. Prior hallux valgus repair and bunionectomy. Tiny radiopacities question calcifications at the plantar soft tissues at the level of the first metatarsal head unchanged. IMPRESSION: Chronic changes of the second through fifth metatarsal heads. Chronic destruction at the base of the proximal phalanx great toe. Increased destruction and cortical loss at the medial margin of the distal first metatarsal highly suspicious for osteomyelitis. Above changes could be better assessed by MR imaging. Electronically Signed   By: Ulyses Southward M.D.   On: 07/09/2017 19:57        Scheduled Meds: . amitriptyline  100 mg Oral QHS  . Ganciclovir  1 drop Right Eye 5 X Daily  . insulin aspart  0-5 Units Subcutaneous QHS  . insulin aspart   0-9 Units Subcutaneous TID WC  . mometasone-formoterol  2 puff Inhalation BID  . pantoprazole  40 mg Oral Daily  . PARoxetine  25 mg Oral Daily  . potassium chloride  40 mEq Oral Q3H  . pravastatin  20 mg Oral q1800  . rOPINIRole  0.5 mg Oral QHS   Continuous Infusions: . sodium chloride    .  0.9 % NaCl with KCl 20 mEq / L 110 mL/hr at 07/10/17 1701  . DAPTOmycin (CUBICIN)  IV       LOS: 1 day    Time spent: 35 minutes    Erick Blinks, MD [facility] Pager [phone]  If 7PM-7AM, please contact night-coverage [email or web address] Password [facility] 07/10/2017, 7:20 PM

## 2017-07-10 NOTE — Progress Notes (Signed)
Late entry:  Patient educated that Zirgan eye gel is not available.  Patient asked to bring supply from home.  Patient verbalized understanding.

## 2017-07-11 LAB — CBC
HCT: 24.6 % — ABNORMAL LOW (ref 39.0–52.0)
Hemoglobin: 8.1 g/dL — ABNORMAL LOW (ref 13.0–17.0)
MCH: 28.6 pg (ref 26.0–34.0)
MCHC: 32.9 g/dL (ref 30.0–36.0)
MCV: 86.9 fL (ref 78.0–100.0)
Platelets: 214 10*3/uL (ref 150–400)
RBC: 2.83 MIL/uL — ABNORMAL LOW (ref 4.22–5.81)
RDW: 14.1 % (ref 11.5–15.5)
WBC: 7 10*3/uL (ref 4.0–10.5)

## 2017-07-11 LAB — BASIC METABOLIC PANEL
Anion gap: 9 (ref 5–15)
BUN: 16 mg/dL (ref 6–20)
CO2: 26 mmol/L (ref 22–32)
Calcium: 8.1 mg/dL — ABNORMAL LOW (ref 8.9–10.3)
Chloride: 105 mmol/L (ref 101–111)
Creatinine, Ser: 1.99 mg/dL — ABNORMAL HIGH (ref 0.61–1.24)
GFR calc Af Amer: 44 mL/min — ABNORMAL LOW (ref >60)
GFR calc non Af Amer: 38 mL/min — ABNORMAL LOW (ref >60)
Glucose, Bld: 171 mg/dL — ABNORMAL HIGH (ref 65–99)
Potassium: 3.5 mmol/L (ref 3.5–5.1)
Sodium: 140 mmol/L (ref 135–145)

## 2017-07-11 LAB — GLUCOSE, CAPILLARY
Glucose-Capillary: 153 mg/dL — ABNORMAL HIGH (ref 65–99)
Glucose-Capillary: 133 mg/dL — ABNORMAL HIGH (ref 65–99)

## 2017-07-11 LAB — MAGNESIUM: Magnesium: 1.4 mg/dL — ABNORMAL LOW (ref 1.7–2.4)

## 2017-07-11 LAB — HIV ANTIBODY (ROUTINE TESTING W REFLEX): HIV Screen 4th Generation wRfx: NONREACTIVE

## 2017-07-11 MED ORDER — MAGNESIUM SULFATE 4 GM/100ML IV SOLN
4.0000 g | Freq: Once | INTRAVENOUS | Status: AC
  Administered 2017-07-11: 4 g via INTRAVENOUS
  Filled 2017-07-11: qty 100

## 2017-07-11 NOTE — Discharge Summary (Signed)
Physician Discharge Summary  [name] BJY:[id number] DOB: Dec 13, 1965 DOA: 07/09/2017  PCP: Alvina Filbert, MD  Admit date: 07/09/2017 Discharge date: 07/11/2017   PATIENT LEFT THE HOSPITAL AGAINST MEDICAL ADVICE  Brief/Interim Summary: 52 year old male with a complex past medical history and multiple medical problems, history of type 2 diabetes, hypertension, recent admission [facility] for right periorbital cellulitis complicated by ophthalmic vein thrombosis, osteomyelitis of left foot and MRSA bacteremia.  4 osteomyelitis and bacteremia, he was discharged home with a 6-week course of IV vancomycin which is followed by infectious disease clinic at [facility]  For ophthalmic vein thrombosis, he was started on anticoagulation with Xarelto.  He presents to the hospital with dark stools, lightheadedness and generalized weakness.  He was found to be significantly anemic with a hemoglobin of 6.9.  Iron studies indicated iron deficiency.  He was transfused 2 units of PRBC with improvement of hemoglobin to 8.1.  He denied any evidence of gross bleeding in his stool.  He refused to undergo any rectal exam to check for melena.  He did not produce a stool sample that can be checked for occult blood.  Hemoglobin remained stable through his hospital course.  I recommended further evaluation of anemia to ensure that it is safe to go back on his anticoagulation, the patient did not wish to stay in the hospital.  Patient also presented with acute renal failure, likely related to vancomycin as well as ARB.  These were both held on admission.  Case was discussed with infectious disease, Dr. Ilsa Iha who recommended that patient be on daptomycin.  He was started on this in the hospital..  His antibiotics will need to run till 4/3.  Unfortunately, since the patient left AGAINST MEDICAL ADVICE, arrangements for further daptomycin could not be made on discharge.  He is followed by [facility] health  [facility].  He was advised to follow-up with his infectious disease physician as soon as possible to discuss further antibiotics.  Patient was unwilling to stay in the hospital any longer to pursue further workup of his medical problems.  I offered him transfer to [facility] since the majority of his specialists are there, patient refused and was very insistent on being discharged home today.  The risks of leaving the hospital AGAINST MEDICAL ADVICE were explained and patient verbalized understanding.  Discharge Diagnoses:  Principal Problem:   Symptomatic anemia Active Problems:   Diabetes mellitus type II, non insulin dependent (HCC)   Diabetic foot ulcer (HCC)   Osteomyelitis of left foot (HCC)   Hypokalemia   Hyponatremia   AKI (acute kidney injury) (HCC)   Essential hypertension   Depression with anxiety   Venous thrombosis    Discharge Instructions    Allergies  Allergen Reactions  . Aspirin Other (See Comments)    Patient says he's a free bleeder.   . Penicillins Hives    Has patient had a PCN reaction causing immediate rash, facial/tongue/throat swelling, SOB or lightheadedness with hypotension: YES Has patient had a PCN reaction causing severe rash involving mucus membranes or skin necrosis: NO Has patient had a PCN reaction that required hospitalization: NO Has patient had a PCN reaction occurring within the last 10 years: NO If all of the above answers are "NO", then may proceed with Cephalosporin use.    Consultations:     Procedures/Studies: US Renal  Result Date: 07/10/2017 CLINICAL DATA:  Acute renal failure. EXAM: RENAL / URINARY TRACT ULTRASOUND  COMPLETE COMPARISON:  Abdomen and pelvis CT dated 07/28/2011. FINDINGS: Right Kidney: Length: 12.8 cm. Echogenicity within normal limits. No mass or hydronephrosis visualized. Left Kidney: Length: 14.0 cm. Echogenicity within normal limits. No mass or hydronephrosis visualized.  Bladder: Appears normal for degree of bladder distention. IMPRESSION: Normal examination. Electronically Signed   By: Beckie Salts M.D.   On: 07/10/2017 10:24   Dg Foot Complete Left  Result Date: 07/09/2017 CLINICAL DATA:  Wounds on LEFT foot, history of osteomyelitis and prior debridement, MRSA diabetic ulcer 06/09/2017 EXAM: LEFT FOOT - COMPLETE 3+ VIEW COMPARISON:  06/25/2017 FINDINGS: Osseous demineralization. Progressive irregularity and cortical loss at the medial margin of the first metatarsal head highly suspicious for osteomyelitis. Again identified destructive changes at the base of the proximal phalanx of the great toe. Deformity question resections involving the heads of the second through fourth metatarsals with additional deformity/destruction of the fifth metatarsal head. No acute fracture or dislocation. Small plantar calcaneal spur. Prior hallux valgus repair and bunionectomy. Tiny radiopacities question calcifications at the plantar soft tissues at the level of the first metatarsal head unchanged. IMPRESSION: Chronic changes of the second through fifth metatarsal heads. Chronic destruction at the base of the proximal phalanx great toe. Increased destruction and cortical loss at the medial margin of the distal first metatarsal highly suspicious for osteomyelitis. Above changes could be better assessed by MR imaging. Electronically Signed   By: Ulyses Southward M.D.   On: 07/09/2017 19:57   Dg Foot Complete Left  Result Date: 06/25/2017 CLINICAL DATA:  Postop wound infection. EXAM: LEFT FOOT - COMPLETE 3+ VIEW COMPARISON:  Most recent available is 10/31/2007 FINDINGS: Interval resections of the second through third metatarsal heads with hypertrophic healing. Hallux valgus with bunionectomy. Primarily chronic appearing irregularity of the great toe proximal phalanx base with spurring and bone loss. On the lateral view however the plantar aspect appears indistinctly eroded. Skin staples overlap the  first MTP joint. There is a cluster of punctate densities over the plantar forefoot at the level of the metatarsal heads, age-indeterminate. No soft tissue emphysema. No acute fracture. IMPRESSION: 1. Limited specificity without recent priors. 2. Irregularity of the great toe proximal phalanx at the MTP joint, possible osteomyelitis. 3. Cluster of punctate densities on the plantar forefoot at the level of the metatarsal heads, heterotopic ossification versus age-indeterminate foreign bodies. 4. No soft tissue emphysema. Electronically Signed   By: Marnee Spring M.D.   On: 06/25/2017 19:28        The results of significant diagnostics from this hospitalization (including imaging, microbiology, ancillary and laboratory) are listed below for reference.     Microbiology: Recent Results (from the past 240 hour(s))  Culture, blood (Routine X 2) w Reflex to ID Panel     Status: None (Preliminary result)   Collection Time: 07/09/17  4:59 PM  Result Value Ref Range Status   Specimen Description RIGHT ANTECUBITAL  Final   Special Requests   Final    BOTTLES DRAWN AEROBIC AND ANAEROBIC Blood Culture adequate volume   Culture   Final    NO GROWTH 2 DAYS Performed at [facility], [address] [place], [place] [address]    Report Status PENDING  Incomplete  Culture, blood (Routine X 2) w Reflex to ID Panel     Status: None (Preliminary result)   Collection Time: 07/09/17  4:59 PM  Result Value Ref Range Status   Specimen Description LEFT ANTECUBITAL  Final   Special Requests   Final  BOTTLES DRAWN AEROBIC AND ANAEROBIC Blood Culture adequate volume   Culture   Final    NO GROWTH 2 DAYS Performed at [facility], [address], [place], [place] [address]    Report Status PENDING  Incomplete     Labs: BNP (last 3 results) No results for input(s): BNP in the last 8760 hours. Basic Metabolic Panel: Recent Labs  Lab 07/09/17 1659 07/10/17 0559 07/11/17 0558  NA 130* 135 140  K 3.2*  3.0* 3.5  CL 93* 99* 105  CO2 25 25 26   GLUCOSE 189* 180* 171*  BUN 24* 23* 16  CREATININE 2.89* 2.56* 1.99*  CALCIUM 8.2* 8.1* 8.1*  MG  --   --  1.4*   Liver Function Tests: Recent Labs  Lab 07/09/17 1659  AST 23  ALT 11*  ALKPHOS 66  BILITOT 0.9  PROT 6.9  ALBUMIN 3.0*   Recent Labs  Lab 07/09/17 1659  LIPASE 17   No results for input(s): AMMONIA in the last 168 hours. CBC: Recent Labs  Lab 07/09/17 1659 07/10/17 0559 07/11/17 0558  WBC 11.5* 8.4 7.0  NEUTROABS 9.3* 6.1  --   HGB 6.9* 8.1* 8.1*  HCT 21.6* 23.8* 24.6*  MCV 88.2 85.9 86.9  PLT 236 205 214   Cardiac Enzymes: Recent Labs  Lab 07/10/17 0559  CKTOTAL 97   BNP: Invalid input(s): POCBNP CBG: Recent Labs  Lab 07/10/17 1128 07/10/17 1717 07/10/17 2211 07/11/17 0809 07/11/17 1126  GLUCAP 193* 85 140* 153* 133*   D-Dimer No results for input(s): DDIMER in the last 72 hours. Hgb A1c No results for input(s): HGBA1C in the last 72 hours. Lipid Profile No results for input(s): CHOL, HDL, LDLCALC, TRIG, CHOLHDL, LDLDIRECT in the last 72 hours. Thyroid function studies No results for input(s): TSH, T4TOTAL, T3FREE, THYROIDAB in the last 72 hours.  Invalid input(s): FREET3 Anemia work up Recent Labs    07/09/17 1927 07/09/17 1928 07/09/17 1936  VITAMINB12 563  --   --   FOLATE  --   --  28.8  FERRITIN 521*  --   --   TIBC 218*  --   --   IRON 12*  --   --   RETICCTPCT  --  1.3  --    Urinalysis    Component Value Date/Time   COLORURINE STRAW (A) 06/25/2017 1940   APPEARANCEUR CLEAR 06/25/2017 1940   LABSPEC 1.002 (L) 06/25/2017 1940   PHURINE 6.0 06/25/2017 1940   GLUCOSEU NEGATIVE 06/25/2017 1940   HGBUR SMALL (A) 06/25/2017 1940   BILIRUBINUR NEGATIVE 06/25/2017 1940   KETONESUR NEGATIVE 06/25/2017 1940   PROTEINUR NEGATIVE 06/25/2017 1940   UROBILINOGEN 1.0 07/28/2011 1147   NITRITE NEGATIVE 06/25/2017 1940   LEUKOCYTESUR NEGATIVE 06/25/2017 1940   Sepsis  Labs Invalid input(s): PROCALCITONIN,  WBC,  LACTICIDVEN Microbiology Recent Results (from the past 240 hour(s))  Culture, blood (Routine X 2) w Reflex to ID Panel     Status: None (Preliminary result)   Collection Time: 07/09/17  4:59 PM  Result Value Ref Range Status   Specimen Description RIGHT ANTECUBITAL  Final   Special Requests   Final    BOTTLES DRAWN AEROBIC AND ANAEROBIC Blood Culture adequate volume   Culture   Final    NO GROWTH 2 DAYS Performed at [facility], [address], [place], [place]    Report Status PENDING  Incomplete  Culture, blood (Routine X 2) w Reflex to ID Panel  Status: None (Preliminary result)   Collection Time: 07/09/17  4:59 PM  Result Value Ref Range Status   Specimen Description LEFT ANTECUBITAL  Final   Special Requests   Final    BOTTLES DRAWN AEROBIC AND ANAEROBIC Blood Culture adequate volume   Culture   Final    NO GROWTH 2 DAYS Performed at [facility], [address] [place], [place] [address]    Report Status PENDING  Incomplete     Time coordinating discharge: Over 30 minutes  SIGNED:   Erick Blinks, MD  [facility] 07/11/2017, 8:13 PM Pager   If 7PM-7AM, please contact night-coverage [email or web address] Password [email or web address]

## 2017-07-11 NOTE — Progress Notes (Addendum)
Patient requested AMA papers.  Signed AMA papers and verbalized understanding.  Patient's IV removed.  Site WNL. Right upper extremity PICC line in place and WNL.  Patient calling his wife for a ride home.  Dr. Kerry Hough notified via text page.

## 2017-07-11 NOTE — Progress Notes (Signed)
Patient states that he is leaving.  Demands IV removed from left AC.  MD notified.

## 2017-07-12 LAB — UREA NITROGEN, URINE: Urea Nitrogen, Ur: 217 mg/dL

## 2017-07-14 LAB — CULTURE, BLOOD (ROUTINE X 2)
Culture: NO GROWTH
Culture: NO GROWTH
Special Requests: ADEQUATE
Special Requests: ADEQUATE

## 2018-05-18 ENCOUNTER — Ambulatory Visit: Payer: Medicaid Other | Admitting: Nutrition

## 2018-06-02 ENCOUNTER — Ambulatory Visit: Payer: Self-pay | Admitting: "Endocrinology

## 2018-10-20 ENCOUNTER — Other Ambulatory Visit (HOSPITAL_COMMUNITY)
Admission: RE | Admit: 2018-10-20 | Discharge: 2018-10-20 | Disposition: A | Discharge status: Home / Self Care | Payer: Medicaid Other | Source: Ambulatory Visit | Attending: Orthopedic Surgery | Admitting: Orthopedic Surgery

## 2018-10-20 ENCOUNTER — Encounter (HOSPITAL_COMMUNITY): Payer: Self-pay | Admitting: Certified Registered Nurse Anesthetist

## 2018-10-20 ENCOUNTER — Ambulatory Visit (INDEPENDENT_AMBULATORY_CARE_PROVIDER_SITE_OTHER): Payer: Medicaid Other

## 2018-10-20 ENCOUNTER — Other Ambulatory Visit: Payer: Self-pay

## 2018-10-20 ENCOUNTER — Encounter: Payer: Self-pay | Admitting: Orthopedic Surgery

## 2018-10-20 ENCOUNTER — Telehealth: Payer: Self-pay | Admitting: Orthopedic Surgery

## 2018-10-20 ENCOUNTER — Ambulatory Visit: Payer: Self-pay

## 2018-10-20 ENCOUNTER — Ambulatory Visit (INDEPENDENT_AMBULATORY_CARE_PROVIDER_SITE_OTHER): Payer: Medicaid Other | Admitting: Orthopedic Surgery

## 2018-10-20 ENCOUNTER — Other Ambulatory Visit: Payer: Self-pay | Admitting: Orthopedic Surgery

## 2018-10-20 VITALS — Ht 72.0 in | Wt 240.7 lb

## 2018-10-20 DIAGNOSIS — M79672 Pain in left foot: Secondary | ICD-10-CM

## 2018-10-20 DIAGNOSIS — E1142 Type 2 diabetes mellitus with diabetic polyneuropathy: Secondary | ICD-10-CM

## 2018-10-20 DIAGNOSIS — Z1159 Encounter for screening for other viral diseases: Secondary | ICD-10-CM | POA: Insufficient documentation

## 2018-10-20 DIAGNOSIS — M86272 Subacute osteomyelitis, left ankle and foot: Secondary | ICD-10-CM

## 2018-10-20 DIAGNOSIS — M79671 Pain in right foot: Secondary | ICD-10-CM

## 2018-10-20 LAB — SARS CORONAVIRUS 2 (TAT 6-24 HRS): SARS Coronavirus 2: NEGATIVE

## 2018-10-20 MED ORDER — DOXYCYCLINE HYCLATE 100 MG PO TABS: 100.0000 mg | ORAL_TABLET | Freq: Two times a day (BID) | ORAL | 0 refills | Status: DC

## 2018-10-20 NOTE — Telephone Encounter (Signed)
Hassan Rowan called to let us know that [name] wanted to hold off on the scheduled transmetatarsal amputation.  He would like to get a 2nd opinion before surgery.  She asks that we cancel the surgery, but call in an antibiotic to their pharmacy, [facility] in [place]. Advised her to please call if they needed anything or if he had any worsening symptoms.

## 2018-10-20 NOTE — Telephone Encounter (Signed)
Prescription sent to his pharmacy for doxycycline 100 mg twice a day.  If patient starts developing  redness that goes up his foot recommend that he go to [facility] emergency room to be admitted for IV antibiotics.

## 2018-10-20 NOTE — Progress Notes (Signed)
Office Visit Note   Patient: [name]           Date of Birth: 08-09-1965           MRN: [id number] Visit Date: 10/20/2018              Requested by: Abran Richard, MD [address] [place],  [place] [address] PCP: Abran Richard, MD  Chief Complaint  Patient presents with  . Left Foot - Pain      HPI: Patient is a 53 year old gentleman with a long history of type 2 diabetes.  Patient complains of painful ulcers worse on the left than the right foot.  Patient walks in today without any shoes walking on the side of his feet.  Patient states he is status post bunion surgery on the right foot and status post multiple surgeries on the left foot when part of the first metatarsal was resected.  Patient complains of pain swelling redness and ulceration over the left forefoot.  Assessment & Plan: Visit Diagnoses:  1. Pain in left foot   2. Pain in right foot   3. Subacute osteomyelitis, left ankle and foot ([place])   4. Diabetic polyneuropathy associated with type 2 diabetes mellitus ([place])     Plan: Patient has destructive osteomyelitis of the second and third metatarsal previous partial resection of the first metatarsal.  Discussed that his best option would be to proceed with a transmetatarsal amputation.  Risk and benefits were discussed including persistent infection nonhealing of the wound need for additional surgery.  Patient states he understands wished to proceed at this time plan for surgery tomorrow and overnight observation.  Follow-Up Instructions: Return in about 2 weeks (around 11/03/2018).   Ortho Exam  Patient is alert, oriented, no adenopathy, well-dressed, normal affect, normal respiratory effort.  Patient ambulates barefoot on the outside of both feet.  He uses a cane for ambulation. Examination patient has palpable pulses bilaterally.  He has a cavus foot with very prominent metatarsal heads on the right.  He has callus beneath the second third and fourth  metatarsal heads on the right but no cellulitis no swelling no signs of infection he has heel cord contracture bilaterally with dorsiflexion about 10 degrees short of neutral.  Examination the left foot he has a Wegner grade 3 ulcer beneath the second and third metatarsal heads.  This ulcer probes down to bone the radiographs shows destructive bony changes.  He has sausage digit swelling of the second and third toes with cellulitis across the forefoot this is tender to palpation.  He is not on antibiotics.  He has a good dorsalis pedis pulse bilaterally.  There are no hemoglobin A1c is on file he states that his hemoglobin A1c generally runs above 7. Imaging: Xr Foot 2 Views Left  Result Date: 10/20/2018 2 view radiographs of the left foot shows previous midshaft first metatarsal resection of the great toe with destructive bony changes of the MTP joint of the second and third toes.  Xr Foot 2 Views Right  Result Date: 10/20/2018 2 view radiographs of the right foot shows previous osteotomy of the first metatarsal as well as a long second and third metatarsal head.  No images are attached to the encounter.  Labs: Lab Results  Component Value Date   REPTSTATUS 07/14/2017 FINAL 07/09/2017   REPTSTATUS 07/14/2017 FINAL 07/09/2017   CULT  07/09/2017    NO GROWTH 5 DAYS Performed at [facility], [address],  [place], [place] [address]    CULT  07/09/2017    NO GROWTH 5 DAYS Performed at [facility], [address] [place], [place] [address]      Lab Results  Component Value Date   ALBUMIN 3.0 (L) 07/09/2017   ALBUMIN 3.1 (L) 06/25/2017   ALBUMIN 4.0 04/18/2011    Lab Results  Component Value Date   MG 1.4 (L) 07/11/2017   No results found for: VD25OH  No results found for: PREALBUMIN CBC EXTENDED Latest Ref Rng & Units 07/11/2017 07/10/2017 07/09/2017  WBC 4.0 - 10.5 K/uL 7.0 8.4 11.5(H)  RBC 4.22 - 5.81 MIL/uL 2.83(L) 2.77(L) 2.53(L)  HGB 13.0 - 17.0 g/dL 8.1(L) 8.1(L)  6.9(LL)  HCT 39.0 - 52.0 % 24.6(L) 23.8(L) 21.6(L)  PLT 150 - 400 K/uL 214 205 236  NEUTROABS 1.7 - 7.7 K/uL - 6.1 9.3(H)  LYMPHSABS 0.7 - 4.0 K/uL - 0.9 0.9     Body mass index is 32.65 kg/m.  Orders:  Orders Placed This Encounter  Procedures  . XR Foot 2 Views Right  . XR Foot 2 Views Left   No orders of the defined types were placed in this encounter.    Procedures: No procedures performed  Clinical Data: No additional findings.  ROS:  All other systems negative, except as noted in the HPI. Review of Systems  Objective: Vital Signs: Ht 6' (1.829 m)   Wt 240 lb 11.8 oz (109.2 kg)   BMI 32.65 kg/m   Specialty Comments:  No specialty comments available.  PMFS History: Patient Active Problem List   Diagnosis Date Noted  . Diabetes mellitus type II, non insulin dependent (HCC) 07/09/2017  . Diabetic foot ulcer (HCC) 07/09/2017  . Osteomyelitis of left foot (HCC) 07/09/2017  . Hypokalemia 07/09/2017  . Hyponatremia 07/09/2017  . AKI (acute kidney injury) (HCC) 07/09/2017  . Symptomatic anemia 07/09/2017  . Essential hypertension 07/09/2017  . Depression with anxiety 07/09/2017  . Venous thrombosis 07/09/2017   Past Medical History:  Diagnosis Date  . Asthma   . Diabetes mellitus   . 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 Hypertension   . Pancreatitis     History reviewed. No pertinent family history.  Past Surgical History:  Procedure Laterality Date  . APPENDECTOMY    . FOOT SURGERY Left   . LAPAROSCOPIC APPENDECTOMY  07/28/2011   Procedure: APPENDECTOMY LAPAROSCOPIC;  Surgeon: Fabio Bering, MD;  Location: [facility];  Service: General;  Laterality: N/A;  . LEG SURGERY     Social History   Occupational History  . Not on file  Tobacco Use  . Smoking status: Never Smoker  . Smokeless tobacco: Never Used  Substance and Sexual Activity  . Alcohol use: Yes    Comment: occasionally   . Drug use: No  . Sexual activity: Not on file

## 2018-10-20 NOTE — Progress Notes (Addendum)
I spoke with [name] who informed me that he is not having surgery tomorrow. "I got to think about this and maybe get a second opiniion."  I asked patient if he had notified office, he said he has called but not gotten anyone, I instructed patient to keep trying to get the office.  I called and spoke with [name]  and informed her of patient's decision.

## 2018-10-20 NOTE — Telephone Encounter (Signed)
See below and advise if you want to call in ABX. Thanks

## 2018-10-21 ENCOUNTER — Inpatient Hospital Stay (HOSPITAL_COMMUNITY): Admission: RE | Admit: 2018-10-21 | Payer: Medicaid Other | Source: Home / Self Care | Admitting: Orthopedic Surgery

## 2018-10-21 SURGERY — AMPUTATION, FOOT, PARTIAL
Anesthesia: Choice | Laterality: Left

## 2018-10-28 ENCOUNTER — Telehealth: Payer: Self-pay | Admitting: Orthopedic Surgery

## 2018-10-28 NOTE — Telephone Encounter (Signed)
10/20/2018 ov note faxed to [facility] [phone],received vm from her stated patient was there to see Dr Juliann Pulse.

## 2018-11-07 ENCOUNTER — Other Ambulatory Visit: Payer: Self-pay

## 2018-11-07 ENCOUNTER — Other Ambulatory Visit (HOSPITAL_COMMUNITY)
Admission: RE | Admit: 2018-11-07 | Discharge: 2018-11-07 | Disposition: A | Discharge status: Home / Self Care | Payer: Medicaid Other | Source: Ambulatory Visit | Attending: Orthopedic Surgery | Admitting: Orthopedic Surgery

## 2018-11-08 ENCOUNTER — Encounter (HOSPITAL_COMMUNITY): Payer: Self-pay | Admitting: *Deleted

## 2018-11-08 ENCOUNTER — Other Ambulatory Visit: Payer: Self-pay

## 2018-11-08 NOTE — Progress Notes (Signed)
Spoke with pt for pre-op call after several attempts to contact him. Pt states he had a heart attack "years ago" ?2008. States he did not have a cath done or ever saw a cardiologist. Denies any recent chest pain or sob. Pt is a type 2 diabetic. Last A1C was about 6 months and he says it was 7 something. Pt states his fasting blood sugar is usually around 140-150. Instructed pt to take 1/2 of his dose of Humulin N tonight. Instructed him to check his blood sugar when he gets up and every 2 hours until he leaves for the hospital. If blood sugar is >220 take 1/2 of usual correction dose of Novolog insulin. If blood sugar is 70 or below, treat with 1/2 cup of clear juice (apple or cranberry) and recheck blood sugar 15 minutes after drinking juice. If blood sugar continues to be 70 or below, call the Short Stay department and ask to speak to a nurse. Pt voiced understanding.  Pt had Covid test done yesterday and it is negative. Pt states he is in quarantine.    Coronavirus Screening  Have you experienced the following symptoms:  Cough NO Fever (>100.73F)  NO Runny nose NO Sore throat NO Difficulty breathing/shortness of breath  NO  Have you or a family member traveled in the last 14 days and where? NO   Patient reminded that hospital visitation restrictions are in effect and the importance of the restrictions.

## 2018-11-09 ENCOUNTER — Other Ambulatory Visit: Payer: Self-pay

## 2018-11-09 ENCOUNTER — Ambulatory Visit (HOSPITAL_COMMUNITY)
Admission: RE | Admit: 2018-11-09 | Discharge: 2018-11-09 | Disposition: A | Discharge status: Home / Self Care | Payer: Medicaid Other | Attending: Orthopedic Surgery | Admitting: Orthopedic Surgery

## 2018-11-09 ENCOUNTER — Inpatient Hospital Stay (HOSPITAL_COMMUNITY): Payer: Medicaid Other | Admitting: Anesthesiology

## 2018-11-09 ENCOUNTER — Encounter (HOSPITAL_COMMUNITY): Payer: Self-pay

## 2018-11-09 ENCOUNTER — Encounter (HOSPITAL_COMMUNITY)
Admission: RE | Disposition: A | Discharge status: Home / Self Care | Payer: Self-pay | Source: Home / Self Care | Attending: Orthopedic Surgery

## 2018-11-09 DIAGNOSIS — E11621 Type 2 diabetes mellitus with foot ulcer: Secondary | ICD-10-CM | POA: Diagnosis not present

## 2018-11-09 DIAGNOSIS — M1990 Unspecified osteoarthritis, unspecified site: Secondary | ICD-10-CM | POA: Diagnosis not present

## 2018-11-09 DIAGNOSIS — E1142 Type 2 diabetes mellitus with diabetic polyneuropathy: Secondary | ICD-10-CM | POA: Diagnosis not present

## 2018-11-09 DIAGNOSIS — Z886 Allergy status to analgesic agent status: Secondary | ICD-10-CM | POA: Diagnosis not present

## 2018-11-09 DIAGNOSIS — I252 Old myocardial infarction: Secondary | ICD-10-CM | POA: Diagnosis not present

## 2018-11-09 DIAGNOSIS — J45909 Unspecified asthma, uncomplicated: Secondary | ICD-10-CM | POA: Diagnosis not present

## 2018-11-09 DIAGNOSIS — M869 Osteomyelitis, unspecified: Secondary | ICD-10-CM | POA: Diagnosis present

## 2018-11-09 DIAGNOSIS — Z1159 Encounter for screening for other viral diseases: Secondary | ICD-10-CM | POA: Diagnosis not present

## 2018-11-09 DIAGNOSIS — Z880 Allergy status to penicillin: Secondary | ICD-10-CM | POA: Insufficient documentation

## 2018-11-09 DIAGNOSIS — I10 Essential (primary) hypertension: Secondary | ICD-10-CM | POA: Diagnosis not present

## 2018-11-09 DIAGNOSIS — L97529 Non-pressure chronic ulcer of other part of left foot with unspecified severity: Secondary | ICD-10-CM | POA: Diagnosis not present

## 2018-11-09 DIAGNOSIS — E1169 Type 2 diabetes mellitus with other specified complication: Secondary | ICD-10-CM | POA: Diagnosis not present

## 2018-11-09 DIAGNOSIS — M86272 Subacute osteomyelitis, left ankle and foot: Secondary | ICD-10-CM | POA: Insufficient documentation

## 2018-11-09 DIAGNOSIS — L97519 Non-pressure chronic ulcer of other part of right foot with unspecified severity: Secondary | ICD-10-CM | POA: Diagnosis not present

## 2018-11-09 HISTORY — PX: AMPUTATION: SHX166

## 2018-11-09 HISTORY — DX: Acute myocardial infarction, unspecified: I21.9

## 2018-11-09 HISTORY — DX: Unspecified osteoarthritis, unspecified site: M19.90

## 2018-11-09 HISTORY — DX: Attention-deficit hyperactivity disorder, unspecified type: F90.9

## 2018-11-09 LAB — BASIC METABOLIC PANEL
Anion gap: 11 (ref 5–15)
BUN: 11 mg/dL (ref 6–20)
CO2: 24 mmol/L (ref 22–32)
Calcium: 9.2 mg/dL (ref 8.9–10.3)
Chloride: 97 mmol/L — ABNORMAL LOW (ref 98–111)
Creatinine, Ser: 0.68 mg/dL (ref 0.61–1.24)
GFR calc Af Amer: >60 mL/min (ref >60)
GFR calc non Af Amer: >60 mL/min (ref >60)
Glucose, Bld: 217 mg/dL — ABNORMAL HIGH (ref 70–99)
Potassium: 4.3 mmol/L (ref 3.5–5.1)
Sodium: 132 mmol/L — ABNORMAL LOW (ref 135–145)

## 2018-11-09 LAB — CBC
HCT: 38 % — ABNORMAL LOW (ref 39.0–52.0)
Hemoglobin: 12.6 g/dL — ABNORMAL LOW (ref 13.0–17.0)
MCH: 29.4 pg (ref 26.0–34.0)
MCHC: 33.2 g/dL (ref 30.0–36.0)
MCV: 88.6 fL (ref 80.0–100.0)
Platelets: 219 10*3/uL (ref 150–400)
RBC: 4.29 MIL/uL (ref 4.22–5.81)
RDW: 13.4 % (ref 11.5–15.5)
WBC: 8.3 10*3/uL (ref 4.0–10.5)
nRBC: 0 % (ref 0.0–0.2)

## 2018-11-09 LAB — GLUCOSE, CAPILLARY
Glucose-Capillary: 221 mg/dL — ABNORMAL HIGH (ref 70–99)
Glucose-Capillary: 174 mg/dL — ABNORMAL HIGH (ref 70–99)

## 2018-11-09 LAB — SURGICAL PCR SCREEN
MRSA, PCR: POSITIVE — AB
Staphylococcus aureus: POSITIVE — AB

## 2018-11-09 LAB — SARS CORONAVIRUS 2 BY RT PCR (HOSPITAL ORDER, PERFORMED IN ~~LOC~~ HOSPITAL LAB): SARS Coronavirus 2: NEGATIVE

## 2018-11-09 LAB — HEMOGLOBIN A1C
Hgb A1c MFr Bld: 9.7 % — ABNORMAL HIGH (ref 4.8–5.6)
Mean Plasma Glucose: 231.69 mg/dL

## 2018-11-09 SURGERY — AMPUTATION, FOOT, PARTIAL
Anesthesia: Monitor Anesthesia Care | Site: Foot | Laterality: Left

## 2018-11-09 MED ORDER — OXYCODONE HCL 5 MG/5ML PO SOLN: 5.0000 mg | Freq: Once | ORAL | Status: DC | PRN

## 2018-11-09 MED ORDER — CLINDAMYCIN PHOSPHATE 900 MG/50ML IV SOLN
900.0000 mg | INTRAVENOUS | Status: AC
  Administered 2018-11-09: 900 mg via INTRAVENOUS
  Filled 2018-11-09: qty 50

## 2018-11-09 MED ORDER — MIDAZOLAM HCL 2 MG/2ML IJ SOLN
1.0000 mg | Freq: Once | INTRAMUSCULAR | Status: AC
  Administered 2018-11-09: 1 mg via INTRAVENOUS

## 2018-11-09 MED ORDER — OXYCODONE-ACETAMINOPHEN 5-325 MG PO TABS
1.0000 | ORAL_TABLET | Freq: Once | ORAL | Status: AC
  Administered 2018-11-09: 1 via ORAL

## 2018-11-09 MED ORDER — FENTANYL CITRATE (PF) 100 MCG/2ML IJ SOLN
INTRAMUSCULAR | Status: AC
  Administered 2018-11-09: 100 ug via INTRAVENOUS
  Filled 2018-11-09: qty 2

## 2018-11-09 MED ORDER — LACTATED RINGERS IV SOLN
INTRAVENOUS | Status: DC | PRN
  Administered 2018-11-09: 13:00:00 via INTRAVENOUS

## 2018-11-09 MED ORDER — PROPOFOL 10 MG/ML IV BOLUS
INTRAVENOUS | Status: DC | PRN
  Administered 2018-11-09: 70 mg via INTRAVENOUS
  Administered 2018-11-09: 10 mg via INTRAVENOUS

## 2018-11-09 MED ORDER — OXYCODONE-ACETAMINOPHEN 5-325 MG PO TABS: 1.0000 | ORAL_TABLET | ORAL | 0 refills | Status: DC | PRN

## 2018-11-09 MED ORDER — ENSURE PRE-SURGERY PO LIQD
296.0000 mL | Freq: Once | ORAL | Status: DC
  Filled 2018-11-09: qty 296

## 2018-11-09 MED ORDER — MUPIROCIN 2 % EX OINT
1.0000 "application " | TOPICAL_OINTMENT | Freq: Once | CUTANEOUS | Status: AC
  Administered 2018-11-09: 1 via TOPICAL

## 2018-11-09 MED ORDER — INSULIN ASPART 100 UNIT/ML ~~LOC~~ SOLN
5.0000 [IU] | Freq: Once | SUBCUTANEOUS | Status: AC
  Administered 2018-11-09: 5 [IU] via SUBCUTANEOUS

## 2018-11-09 MED ORDER — PROPOFOL 500 MG/50ML IV EMUL
INTRAVENOUS | Status: DC | PRN
  Administered 2018-11-09: 80 ug/kg/min via INTRAVENOUS

## 2018-11-09 MED ORDER — SODIUM CHLORIDE 0.9 % IV SOLN: INTRAVENOUS | Status: DC

## 2018-11-09 MED ORDER — FENTANYL CITRATE (PF) 100 MCG/2ML IJ SOLN
INTRAMUSCULAR | Status: AC
  Filled 2018-11-09: qty 2

## 2018-11-09 MED ORDER — EPHEDRINE SULFATE 50 MG/ML IJ SOLN
INTRAMUSCULAR | Status: DC | PRN
  Administered 2018-11-09 (×2): 5 mg via INTRAVENOUS

## 2018-11-09 MED ORDER — 0.9 % SODIUM CHLORIDE (POUR BTL) OPTIME
TOPICAL | Status: DC | PRN
  Administered 2018-11-09: 1000 mL

## 2018-11-09 MED ORDER — OXYCODONE-ACETAMINOPHEN 5-325 MG PO TABS
ORAL_TABLET | ORAL | Status: AC
  Filled 2018-11-09: qty 1

## 2018-11-09 MED ORDER — FENTANYL CITRATE (PF) 100 MCG/2ML IJ SOLN
25.0000 ug | INTRAMUSCULAR | Status: DC | PRN
  Administered 2018-11-09 (×2): 50 ug via INTRAVENOUS

## 2018-11-09 MED ORDER — OXYCODONE HCL 5 MG PO TABS: 5.0000 mg | ORAL_TABLET | Freq: Once | ORAL | Status: DC | PRN

## 2018-11-09 MED ORDER — MIDAZOLAM HCL 2 MG/2ML IJ SOLN
INTRAMUSCULAR | Status: AC
  Administered 2018-11-09: 1 mg via INTRAVENOUS
  Filled 2018-11-09: qty 2

## 2018-11-09 MED ORDER — ONDANSETRON HCL 4 MG/2ML IJ SOLN: 4.0000 mg | Freq: Once | INTRAMUSCULAR | Status: DC | PRN

## 2018-11-09 MED ORDER — POVIDONE-IODINE 10 % EX SWAB: 2.0000 "application " | Freq: Once | CUTANEOUS | Status: DC

## 2018-11-09 MED ORDER — CHLORHEXIDINE GLUCONATE 4 % EX LIQD: 60.0000 mL | Freq: Once | CUTANEOUS | Status: DC

## 2018-11-09 MED ORDER — MUPIROCIN 2 % EX OINT
TOPICAL_OINTMENT | CUTANEOUS | Status: AC
  Administered 2018-11-09: 1 via TOPICAL
  Filled 2018-11-09: qty 22

## 2018-11-09 MED ORDER — LIDOCAINE HCL (CARDIAC) PF 100 MG/5ML IV SOSY
PREFILLED_SYRINGE | INTRAVENOUS | Status: DC | PRN
  Administered 2018-11-09: 40 mg via INTRAVENOUS

## 2018-11-09 MED ORDER — FENTANYL CITRATE (PF) 100 MCG/2ML IJ SOLN
100.0000 ug | Freq: Once | INTRAMUSCULAR | Status: AC
  Administered 2018-11-09: 12:00:00 100 ug via INTRAVENOUS

## 2018-11-09 SURGICAL SUPPLY — 37 items
BLADE SAW SGTL HD 18.5X60.5X1. (BLADE) ×2 IMPLANT
BLADE SURG 21 STRL SS (BLADE) ×2 IMPLANT
BNDG COHESIVE 4X5 TAN STRL (GAUZE/BANDAGES/DRESSINGS) ×1 IMPLANT
BNDG GAUZE ELAST 4 BULKY (GAUZE/BANDAGES/DRESSINGS) IMPLANT
COVER SURGICAL LIGHT HANDLE (MISCELLANEOUS) ×2 IMPLANT
DRAPE INCISE IOBAN 66X45 STRL (DRAPES) ×2 IMPLANT
DRAPE U-SHAPE 47X51 STRL (DRAPES) ×2 IMPLANT
DRSG ADAPTIC 3X8 NADH LF (GAUZE/BANDAGES/DRESSINGS) IMPLANT
DRSG PAD ABDOMINAL 8X10 ST (GAUZE/BANDAGES/DRESSINGS) IMPLANT
DURAPREP 26ML APPLICATOR (WOUND CARE) ×2 IMPLANT
ELECT REM PT RETURN 9FT ADLT (ELECTROSURGICAL) ×2
ELECTRODE REM PT RTRN 9FT ADLT (ELECTROSURGICAL) ×1 IMPLANT
GAUZE SPONGE 4X4 12PLY STRL (GAUZE/BANDAGES/DRESSINGS) IMPLANT
GLOVE BIOGEL PI IND STRL 6.5 (GLOVE) IMPLANT
GLOVE BIOGEL PI IND STRL 9 (GLOVE) ×1 IMPLANT
GLOVE BIOGEL PI INDICATOR 6.5 (GLOVE) ×1
GLOVE BIOGEL PI INDICATOR 9 (GLOVE) ×1
GLOVE SURG ORTHO 9.0 STRL STRW (GLOVE) ×2 IMPLANT
GLOVE SURG SS PI 6.5 STRL IVOR (GLOVE) ×1 IMPLANT
GOWN STRL REUS W/ TWL LRG LVL3 (GOWN DISPOSABLE) IMPLANT
GOWN STRL REUS W/ TWL XL LVL3 (GOWN DISPOSABLE) ×3 IMPLANT
GOWN STRL REUS W/TWL LRG LVL3 (GOWN DISPOSABLE) ×1
GOWN STRL REUS W/TWL XL LVL3 (GOWN DISPOSABLE) ×1
KIT BASIN OR (CUSTOM PROCEDURE TRAY) ×2 IMPLANT
KIT DRSG PREVENA PLUS 7DAY 125 (MISCELLANEOUS) ×1 IMPLANT
KIT PREVENA INCISION MGT 13 (CANNISTER) ×1 IMPLANT
KIT TURNOVER KIT B (KITS) ×2 IMPLANT
NS IRRIG 1000ML POUR BTL (IV SOLUTION) ×2 IMPLANT
PACK ORTHO EXTREMITY (CUSTOM PROCEDURE TRAY) ×2 IMPLANT
PAD ARMBOARD 7.5X6 YLW CONV (MISCELLANEOUS) ×3 IMPLANT
SPONGE LAP 18X18 RF (DISPOSABLE) IMPLANT
SUT ETHILON 2 0 PSLX (SUTURE) ×4 IMPLANT
TOWEL GREEN STERILE (TOWEL DISPOSABLE) ×2 IMPLANT
TOWEL GREEN STERILE FF (TOWEL DISPOSABLE) ×2 IMPLANT
TUBE CONNECTING 12X1/4 (SUCTIONS) ×2 IMPLANT
WATER STERILE IRR 1000ML POUR (IV SOLUTION) ×1 IMPLANT
YANKAUER SUCT BULB TIP NO VENT (SUCTIONS) ×2 IMPLANT

## 2018-11-09 NOTE — Transfer of Care (Signed)
Immediate Anesthesia Transfer of Care Note  Patient: [name]  Procedure(s) Performed: LEFT TRANSMETATARSAL AMPUTATION (Left Foot)  Patient Location: PACU  Anesthesia Type:General  Level of Consciousness: awake, alert , oriented and patient cooperative  Airway & Oxygen Therapy: Patient Spontanous Breathing  Post-op Assessment: Report given to RN and Post -op Vital signs reviewed and stable  Post vital signs: Reviewed and stable  Last Vitals:  Vitals Value Taken Time  BP 132/90 11/09/18 1402  Temp 36.1 C 11/09/18 1402  Pulse 95 11/09/18 1411  Resp 14 11/09/18 1411  SpO2 97 % 11/09/18 1411  Vitals shown include unvalidated device data.  Last Pain:  Vitals:   11/09/18 1402  TempSrc:   PainSc: 8       Patients Stated Pain Goal: 2 (11/65/79 0383)  Complications: No apparent anesthesia complications

## 2018-11-09 NOTE — Anesthesia Postprocedure Evaluation (Signed)
Anesthesia Post Note  Patient: [name]  Procedure(s) Performed: LEFT TRANSMETATARSAL AMPUTATION (Left Foot)     Patient location during evaluation: PACU Anesthesia Type: MAC Level of consciousness: awake and alert Pain management: pain level controlled Vital Signs Assessment: post-procedure vital signs reviewed and stable Respiratory status: spontaneous breathing, nonlabored ventilation, respiratory function stable and patient connected to nasal cannula oxygen Cardiovascular status: stable and blood pressure returned to baseline Postop Assessment: no apparent nausea or vomiting Anesthetic complications: no    Last Vitals:  Vitals:   11/09/18 1415 11/09/18 1430  BP: 113/81 (!) 128/95  Pulse: 90 84  Resp: 14 11  Temp:  (!) 36.3 C  SpO2: 97% 97%    Last Pain:  Vitals:   11/09/18 1430  TempSrc:   PainSc: 0-No pain                 Sabeen Piechocki COKER

## 2018-11-09 NOTE — Anesthesia Preprocedure Evaluation (Signed)
Anesthesia Evaluation  Patient identified by MRN, date of birth, ID band Patient awake    Reviewed: Allergy & Precautions, NPO status , Patient's Chart, lab work & pertinent test results  Airway Mallampati: III  TM Distance: >3 FB Neck ROM: Full    Dental  (+) Poor Dentition, Dental Advisory Given,    Pulmonary    breath sounds clear to auscultation       Cardiovascular hypertension,  Rhythm:Regular Rate:Normal     Neuro/Psych    GI/Hepatic   Endo/Other  diabetes  Renal/GU      Musculoskeletal   Abdominal   Peds  Hematology   Anesthesia Other Findings   Reproductive/Obstetrics                             Anesthesia Physical Anesthesia Plan  ASA: III  Anesthesia Plan: MAC   Post-op Pain Management:    Induction: Intravenous  PONV Risk Score and Plan: Ondansetron  Airway Management Planned: Natural Airway and Simple Face Mask  Additional Equipment:   Intra-op Plan:   Post-operative Plan:   Informed Consent: I have reviewed the patients History and Physical, chart, labs and discussed the procedure including the risks, benefits and alternatives for the proposed anesthesia with the patient or authorized representative who has indicated his/her understanding and acceptance.     Dental advisory given  Plan Discussed with: CRNA and Anesthesiologist  Anesthesia Plan Comments: (olan ankle block with MAC  Roberts Gaudy)        Anesthesia Quick Evaluation

## 2018-11-09 NOTE — Op Note (Signed)
11/09/2018  2:10 PM  PATIENT:  [name]    PRE-OPERATIVE DIAGNOSIS:  Osteomyelitis and Ulcer Left Foot  POST-OPERATIVE DIAGNOSIS:  Same  PROCEDURE:  LEFT TRANSMETATARSAL AMPUTATION Application Praveena wound VAC.  SURGEON:  Newt Minion, MD  PHYSICIAN ASSISTANT:None ANESTHESIA:   General  PREOPERATIVE INDICATIONS:  [name] is a  53 y.o. male with a diagnosis of Osteomyelitis and Ulcer Left Foot who failed conservative measures and elected for surgical management.    The risks benefits and alternatives were discussed with the patient preoperatively including but not limited to the risks of infection, bleeding, nerve injury, cardiopulmonary complications, the need for revision surgery, among others, and the patient was willing to proceed.  OPERATIVE IMPLANTS: Praveena wound VAC.  @ENCIMAGES @  OPERATIVE FINDINGS: No abscess or ulceration good petechial bleeding calcified vessels  OPERATIVE PROCEDURE: Patient was brought the operating room after undergoing an ankle block.  After adequate levels anesthesia were obtained patient's left lower extremity was prepped using DuraPrep draped into a sterile field a timeout was called.  A fishmouth incision was made at the level of the MTP joint just proximal to the ulcerative tissue plantarly.  A transmetatarsal amputation was performed with an oscillating saw beveled plantarly.  Electrocautery was used for hemostasis the wound was irrigated with normal saline.  The incision was closed using 2-0 nylon.  A Praveena wound VAC was applied this had a good suction fit patient was taken to the PACU in stable condition.   DISCHARGE PLANNING:  Antibiotic duration: Preoperative antibiotics clindamycin  Weightbearing: Touchdown weightbearing on the left  Pain medication: Prescription called in for Percocet  Dressing care/ Wound VAC: Wound VAC for 1 week  Ambulatory devices: Crutches  Discharge to: Home.  Follow-up: In the office 1  week post operative.

## 2018-11-09 NOTE — Anesthesia Procedure Notes (Signed)
Anesthesia Regional Block: Ankle block   Pre-Anesthetic Checklist: ,, timeout performed, Correct Patient, Correct Site, Correct Laterality, Correct Procedure, Correct Position, site marked, Risks and benefits discussed, Surgical consent,  Pre-op evaluation,  Post-op pain management  Laterality: Left  Prep: chloraprep       Needles:  Injection technique: Single-shot      Needle Gauge: 25     Additional Needles:   Narrative:  Start time: 11/09/2018 12:10 PM End time: 11/09/2018 12:15 PM Injection made incrementally with aspirations every 5 mL.  Performed by: Personally  Anesthesiologist: Roberts Gaudy, MD  Additional Notes: 35 cc 2% lidocaine plain injected easily

## 2018-11-09 NOTE — H&P (Signed)
[name] is an 53 y.o. male.   Chief Complaint: Ulceration and pain left forefoot HPI: Patient is a 53 year old gentleman with a long history of type 2 diabetes.  Patient complains of painful ulcers worse on the left than the right foot.  Patient walks in today without any shoes walking on the side of his feet.  Patient states he is status post bunion surgery on the right foot and status post multiple surgeries on the left foot when part of the first metatarsal was resected.  Patient complains of pain swelling redness and ulceration over the left forefoot.  Past Medical History:  Diagnosis Date  . ADHD   . Arthritis   . Asthma   . Diabetes mellitus   . 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 Hypertension   . Myocardial infarction [facility])    ? 2008,  states he did not have a cath done and never saw a cardiologist  . Pancreatitis     Past Surgical History:  Procedure Laterality Date  . APPENDECTOMY    . FOOT SURGERY Left   . LAPAROSCOPIC APPENDECTOMY  07/28/2011   Procedure: APPENDECTOMY LAPAROSCOPIC;  Surgeon: William Bering, MD;  Location: [facility];  Service: General;  Laterality: N/A;  . LEG SURGERY      No family history on file. Social History:  reports that he has never smoked. He has never used smokeless tobacco. He reports current alcohol use. He reports that he does not use drugs.  Allergies:  Allergies  Allergen Reactions  . Aspirin Other (See Comments)    Patient says he's a free bleeder.   . Penicillins Hives    Has patient had a PCN reaction causing immediate rash, facial/tongue/throat swelling, SOB or lightheadedness with hypotension: YES Has patient had a PCN reaction causing severe rash involving mucus membranes or skin necrosis: NO Has patient had a PCN reaction that required hospitalization: NO Has patient had a PCN reaction occurring within the last 10 years: NO If all of the above answers are  "NO", then may proceed with Cephalosporin use.    No medications prior to admission.    No results found for this or any previous visit (from the past 48 hour(s)). No results found.  Review of Systems  All other systems reviewed and are negative.   There were no vitals taken for this visit. Physical Exam  Patient is alert, oriented, no adenopathy, well-dressed, normal affect, normal respiratory effort.  Patient ambulates barefoot on the outside of both feet.  He uses a cane for ambulation. Examination patient has palpable pulses bilaterally.  He has a cavus foot with very prominent metatarsal heads on the right.  He has callus beneath the second third and fourth metatarsal heads on the right but no cellulitis no swelling no signs of infection he has heel cord contracture bilaterally with dorsiflexion about 10 degrees short of neutral.  Examination the left foot he has a Wegner grade 3 ulcer beneath the second and third metatarsal heads.  This ulcer probes down to bone the radiographs shows destructive bony changes.  He has sausage digit swelling of the second and third toes with cellulitis across the forefoot this is tender to palpation.  He is not on antibiotics.  He has a good dorsalis pedis pulse bilaterally.  There are no hemoglobin A1c is on file he states that his hemoglobin A1c generally runs above 7. Assessment/Plan 1. Pain  in left foot   2. Pain in right foot   3. Subacute osteomyelitis, left ankle and foot ([place])   4. Diabetic polyneuropathy associated with type 2 diabetes mellitus ([place])     Plan: Patient has destructive osteomyelitis of the second and third metatarsal previous partial resection of the first metatarsal.  Discussed that his best option would be to proceed with a transmetatarsal amputation.  Risk and benefits were discussed including persistent infection nonhealing of the wound need for additional surgery.  Patient states he understands wished to proceed at this  time plan for surgery tomorrow and overnight observation.   William Minion, MD 11/09/2018, 7:00 AM

## 2018-11-10 ENCOUNTER — Encounter (HOSPITAL_COMMUNITY): Payer: Self-pay | Admitting: Orthopedic Surgery

## 2018-11-11 ENCOUNTER — Telehealth: Payer: Self-pay | Admitting: Orthopedic Surgery

## 2018-11-11 NOTE — Telephone Encounter (Signed)
Patient called and stated medication is not strong enough. Stated feel 3x as worst.  [phone]

## 2018-11-11 NOTE — Telephone Encounter (Signed)
I called and sw pt. He is s/p a transmet amputation on 11/09/18. Pt states that he was only given " Perc 5's" he said "I had major surgery and those Perc 5's aren't cutting it"  The pt said this multiple times and was very upset that he was not getting any more medication. The pt had an rx for Oxycodone 3/25 # 30 on 11/09/18 and Oxycodone 10mg  #45 on 11/07/18 advised that we could not give rx for anything stronger but that we should see him in the office and check his dressing and make sure that everything is ok. I asked if he had been elevating his foot and complying with non weightbearing. He stated that he was doing all he could do and that he was not doing anything he shouldn't then the pt said never mind don't even worry about it when I offered appt today and disconnected the call.

## 2018-11-14 ENCOUNTER — Telehealth: Payer: Self-pay

## 2018-11-14 ENCOUNTER — Telehealth: Payer: Self-pay | Admitting: Orthopedic Surgery

## 2018-11-14 NOTE — Telephone Encounter (Signed)
Talked with patient concerning wound vac beeping.  Offered patient an appointment to come into the office on Tuesday, 11/15/2018, but patient stated that he could only come on Wednesday, 11/16/2018.  Per Dr. Sharol Given, patient could discontinue wound vac and throw it away or patient could remove the entire dressing and apply dry gauze and an ace wrap to his left foot. Advised patient of Dr. Jess Barters message and he stated that he would remove the entire dressing.  Message sent to autumn concerning Rx refill.

## 2018-11-14 NOTE — Telephone Encounter (Signed)
See previous message this has been done.  

## 2018-11-14 NOTE — Telephone Encounter (Signed)
Patient called needing Rx refilled Percocet 5. Patient said the wound vac is beeping and not working correctly (Sending call to triage)  The number to contact patient is [phone]

## 2018-11-14 NOTE — Telephone Encounter (Signed)
Pt had rx oxycodone 5/325 #30 on 11/09/18 and oxycodone 10 mg #45 11/07/18 advised that we would eval at office visit on Wednesday voiced understanding and will call with any questions.

## 2018-11-14 NOTE — Telephone Encounter (Signed)
Patient left a voice mail message stating that he would like to talk to someone about his pain.  He wanted to know if Dr. Sharol Given could write him out an RX for pain.  CB#[phone].  Thank you.

## 2018-11-16 ENCOUNTER — Ambulatory Visit (INDEPENDENT_AMBULATORY_CARE_PROVIDER_SITE_OTHER): Payer: Medicaid Other | Admitting: Family

## 2018-11-16 ENCOUNTER — Encounter: Payer: Self-pay | Admitting: Family

## 2018-11-16 ENCOUNTER — Other Ambulatory Visit: Payer: Self-pay

## 2018-11-16 VITALS — Ht 72.0 in | Wt 240.0 lb

## 2018-11-16 DIAGNOSIS — Z89432 Acquired absence of left foot: Secondary | ICD-10-CM

## 2018-11-16 MED ORDER — OXYCODONE-ACETAMINOPHEN 5-325 MG PO TABS: 1.0000 | ORAL_TABLET | ORAL | 0 refills | Status: DC | PRN

## 2018-11-16 NOTE — Progress Notes (Signed)
   Post-Op Visit Note   Patient: [name]           Date of Birth: [birth year]           MRN: [id number] Visit Date: 11/16/2018 PCP: Abran Richard, MD  Chief Complaint:  Chief Complaint  Patient presents with  . Left Foot - Routine Post Op    11/09/18 left foot transmet amputation     HPI:  HPI The patient is a 53 year old gentleman seen today 1 week status post left foot transmetatarsal amputation he did remove his wound VAC over the weekend and states it was alarming.  Today complaining of throbbing shooting pain states he has been elevating and only walking to the bathroom.  He also has been working with home health agency for some assistance in the home.  He is nonweightbearing with a walker. Ortho Exam Incision well approximated sutures however there is surrounding maceration and serosanguineous drainage there is significant swelling as well.  There is no dehiscence.  There is no ascending cellulitis.  Visit Diagnoses: No diagnosis found.  Plan: He will begin daily Dial soap cleansing dry dressing changes.  Elevate around-the-clock nonweightbearing the importance of these were emphasized.  We will send in orders for some home health assistant and incisional checks.  He will follow-up in the office in 2 weeks.  Follow-Up Instructions: No follow-ups on file.   Imaging: No results found.  Orders:  No orders of the defined types were placed in this encounter.  No orders of the defined types were placed in this encounter.    PMFS History: Patient Active Problem List   Diagnosis Date Noted  . Diabetes mellitus type II, non insulin dependent ([place]) 07/09/2017  . Diabetic foot ulcer ([place]) 07/09/2017  . Osteomyelitis of left foot ([place]) 07/09/2017  . Hypokalemia 07/09/2017  . Hyponatremia 07/09/2017  . AKI (acute kidney injury) ([place]) 07/09/2017  . Symptomatic anemia 07/09/2017  . Essential hypertension 07/09/2017  . Depression with anxiety 07/09/2017  . Venous  thrombosis 07/09/2017   Past Medical History:  Diagnosis Date  . ADHD   . Arthritis   . Asthma   . Diabetes mellitus   . Diabetic ulcer of back associated with type 2 diabetes mellitus, limited to breakdown of skin ([place])   . Hypertension   . Myocardial infarction [facility])    ? 2008,  states he did not have a cath done and never saw a cardiologist  . Pancreatitis     No family history on file.  Past Surgical History:  Procedure Laterality Date  . AMPUTATION Left 11/09/2018   Procedure: LEFT TRANSMETATARSAL AMPUTATION;  Surgeon: Newt Minion, MD;  Location: [place];  Service: Orthopedics;  Laterality: Left;  . APPENDECTOMY    . FOOT SURGERY Left   . LAPAROSCOPIC APPENDECTOMY  07/28/2011   Procedure: APPENDECTOMY LAPAROSCOPIC;  Surgeon: Donato Heinz, MD;  Location: [facility];  Service: General;  Laterality: N/A;  . LEG SURGERY     Social History   Occupational History  . Not on file  Tobacco Use  . Smoking status: Never Smoker  . Smokeless tobacco: Never Used  Substance and Sexual Activity  . Alcohol use: Yes    Comment: occasionally  . Drug use: No  . Sexual activity: Not on file

## 2018-11-23 ENCOUNTER — Telehealth: Payer: Self-pay | Admitting: Orthopedic Surgery

## 2018-11-23 MED ORDER — OXYCODONE-ACETAMINOPHEN 5-325 MG PO TABS: 1.0000 | ORAL_TABLET | Freq: Four times a day (QID) | ORAL | 0 refills | Status: DC | PRN

## 2018-11-23 NOTE — Telephone Encounter (Signed)
Patient called requesting an RX refill on her Oxycodone.  Patient uses [facility].  CB#[phone].  Thank you.

## 2018-11-23 NOTE — Telephone Encounter (Signed)
Erin please advise, thank you.  

## 2018-11-28 ENCOUNTER — Telehealth: Payer: Self-pay | Admitting: Family

## 2018-11-28 NOTE — Telephone Encounter (Signed)
Patient called asked when can he get the Rx for mersa? The number to contact patient is [phone]

## 2018-11-29 ENCOUNTER — Telehealth: Payer: Self-pay | Admitting: Family

## 2018-11-29 MED ORDER — OXYCODONE-ACETAMINOPHEN 5-325 MG PO TABS: 1.0000 | ORAL_TABLET | Freq: Three times a day (TID) | ORAL | 0 refills | Status: DC | PRN

## 2018-11-29 NOTE — Telephone Encounter (Signed)
No abx, sent pain med, should be coming in this week

## 2018-11-29 NOTE — Telephone Encounter (Signed)
Patient called left voicemail concerning Rx Oxycodone. The number to contact patient is [phone]

## 2018-11-29 NOTE — Telephone Encounter (Signed)
Patient was called and informed of Rx and he understood that he does has appt tomorrow to  F/u with Erin.

## 2018-11-29 NOTE — Telephone Encounter (Signed)
Erin please advise, thank you.  

## 2018-11-30 ENCOUNTER — Encounter: Payer: Self-pay | Admitting: Family

## 2018-11-30 ENCOUNTER — Ambulatory Visit (INDEPENDENT_AMBULATORY_CARE_PROVIDER_SITE_OTHER): Payer: Medicaid Other | Admitting: Family

## 2018-11-30 VITALS — Ht 72.0 in | Wt 240.0 lb

## 2018-11-30 DIAGNOSIS — Z89432 Acquired absence of left foot: Secondary | ICD-10-CM

## 2018-11-30 MED ORDER — DOXYCYCLINE HYCLATE 100 MG PO TABS: 100.0000 mg | ORAL_TABLET | Freq: Two times a day (BID) | ORAL | 0 refills | Status: DC

## 2018-11-30 NOTE — Progress Notes (Signed)
Post-Op Visit Note   Patient: [name]           Date of Birth: 03/19/1966           MRN: [id number] Visit Date: 11/30/2018 PCP: Abran Richard, MD  Chief Complaint:  Chief Complaint  Patient presents with  . Left Foot - Routine Post Op    11/09/18 left TMA    HPI:  HPI The patient is a 53 year old gentleman seen today status post left transmetatarsal amputation July 22.  He states he is been nonweightbearing with a walker.  Complaining of some increased clear drainage as well as a green area to his incision.  He does have swelling and complaints with odor.  Reports he has been changing his dressing 3 times a week Ortho Exam On examination of the left foot.  The sutures remain in place for his transmetatarsal amputation.  This is healed laterally however there is significant swelling to his foot and mild erythema.  There is an open area centrally this is 1 cm in diameter and 4 mm deep.  This does not probe to bone.  There is necrotic tissue in the wound bed and a foul odor.  There is serous drainage  Visit Diagnoses: No diagnosis found.  Plan: Will restart his doxycycline.  If necessary he could go back to taking his oxycodone 4 times a day.  Encouraged him to elevate and change the dressing daily as needed soaked.  He will follow-up in office in 1 week.  Concerned will need revision surgery  Follow-Up Instructions: Return in about 8 days (around 12/08/2018).   Imaging: No results found.  Orders:  No orders of the defined types were placed in this encounter.  Meds ordered this encounter  Medications  . doxycycline (VIBRA-TABS) 100 MG tablet    Sig: Take 1 tablet (100 mg total) by mouth 2 (two) times daily.    Dispense:  60 tablet    Refill:  0     PMFS History: Patient Active Problem List   Diagnosis Date Noted  . Diabetes mellitus type II, non insulin dependent ([place]) 07/09/2017  . Diabetic foot ulcer ([place]) 07/09/2017  . Osteomyelitis of left foot ([place])  07/09/2017  . Hypokalemia 07/09/2017  . Hyponatremia 07/09/2017  . AKI (acute kidney injury) ([place]) 07/09/2017  . Symptomatic anemia 07/09/2017  . Essential hypertension 07/09/2017  . Depression with anxiety 07/09/2017  . Venous thrombosis 07/09/2017   Past Medical History:  Diagnosis Date  . ADHD   . Arthritis   . Asthma   . Diabetes mellitus   . Diabetic ulcer of back associated with type 2 diabetes mellitus, limited to breakdown of skin ([place])   . Hypertension   . Myocardial infarction [facility])    ? 2008,  states he did not have a cath done and never saw a cardiologist  . Pancreatitis     No family history on file.  Past Surgical History:  Procedure Laterality Date  . AMPUTATION Left 11/09/2018   Procedure: LEFT TRANSMETATARSAL AMPUTATION;  Surgeon: Newt Minion, MD;  Location: [place];  Service: Orthopedics;  Laterality: Left;  . APPENDECTOMY    . FOOT SURGERY Left   . LAPAROSCOPIC APPENDECTOMY  07/28/2011   Procedure: APPENDECTOMY LAPAROSCOPIC;  Surgeon: Donato Heinz, MD;  Location: [facility];  Service: General;  Laterality: N/A;  . LEG SURGERY     Social History   Occupational History  . Not on file  Tobacco Use  . Smoking  status: Never Smoker  . Smokeless tobacco: Never Used  Substance and Sexual Activity  . Alcohol use: Yes    Comment: occasionally  . Drug use: No  . Sexual activity: Not on file

## 2018-12-02 ENCOUNTER — Telehealth: Payer: Self-pay | Admitting: Orthopedic Surgery

## 2018-12-02 NOTE — Telephone Encounter (Signed)
Too soon to refill.

## 2018-12-02 NOTE — Telephone Encounter (Signed)
Patient called concerning getting Rx refilled (Percocet) I read note from Dr Sharol Given to patient.   Patient asked when can he get Rx refilled? The number to contact patient is [phone]

## 2018-12-02 NOTE — Telephone Encounter (Signed)
Pt is s/p a left transmet amputation 11/09/18 requesting refill on pain medication. He has had 154 tabs since surgery. Last refill was Oxycodone 5/325 #21 on 11/29/18 please advise.

## 2018-12-02 NOTE — Telephone Encounter (Signed)
Please advise. Thanks.  

## 2018-12-02 NOTE — Telephone Encounter (Signed)
See other note forwarded to Dr Sharol Given to let us know when patient can get refill per patients request.

## 2018-12-02 NOTE — Telephone Encounter (Signed)
IC advised patient.  

## 2018-12-02 NOTE — Telephone Encounter (Signed)
He can get a refill in a week

## 2018-12-02 NOTE — Telephone Encounter (Signed)
Patient called requesting an RX refill on his Oxycodone.  Patient uses [facility] in [place].  CB#[phone].  Thank you.

## 2018-12-05 ENCOUNTER — Other Ambulatory Visit: Payer: Self-pay | Admitting: Orthopedic Surgery

## 2018-12-05 ENCOUNTER — Telehealth: Payer: Self-pay | Admitting: Orthopedic Surgery

## 2018-12-05 MED ORDER — OXYCODONE-ACETAMINOPHEN 5-325 MG PO TABS: 1.0000 | ORAL_TABLET | Freq: Three times a day (TID) | ORAL | 0 refills | Status: DC | PRN

## 2018-12-05 NOTE — Telephone Encounter (Signed)
Pt was advised that he needed to wait until this week for percocet refill. Calling for this.

## 2018-12-05 NOTE — Telephone Encounter (Signed)
rx written

## 2018-12-05 NOTE — Telephone Encounter (Signed)
Patient left a voicemail requesting a refill on his Percocet.  Patient uses [facility] in [place].  CB#[phone].  Thank you.

## 2018-12-07 ENCOUNTER — Encounter: Payer: Self-pay | Admitting: Family

## 2018-12-07 ENCOUNTER — Ambulatory Visit (INDEPENDENT_AMBULATORY_CARE_PROVIDER_SITE_OTHER): Payer: Medicaid Other | Admitting: Family

## 2018-12-07 VITALS — Ht 72.0 in | Wt 240.0 lb

## 2018-12-07 DIAGNOSIS — Z89432 Acquired absence of left foot: Secondary | ICD-10-CM

## 2018-12-07 DIAGNOSIS — E11621 Type 2 diabetes mellitus with foot ulcer: Secondary | ICD-10-CM

## 2018-12-07 DIAGNOSIS — L97524 Non-pressure chronic ulcer of other part of left foot with necrosis of bone: Secondary | ICD-10-CM

## 2018-12-07 MED ORDER — MUPIROCIN 2 % EX OINT: 1.0000 "application " | TOPICAL_OINTMENT | Freq: Every day | CUTANEOUS | 6 refills | Status: DC

## 2018-12-07 MED ORDER — NITROGLYCERIN 0.2 MG/HR TD PT24: 0.2000 mg | MEDICATED_PATCH | Freq: Every day | TRANSDERMAL | 1 refills | Status: DC

## 2018-12-07 NOTE — Progress Notes (Signed)
Post-Op Visit Note   Patient: [name]           Date of Birth: 04/15/1966           MRN: [id number] Visit Date: 12/07/2018 PCP: Alvina Filbert, MD  Chief Complaint:  Chief Complaint  Patient presents with  . Left Foot - Routine Post Op    11/09/18 left transmet amputation     HPI:  HPI The patient is a 53 year old gentleman who presents today about a month out from a left transmetatarsal amputation.  He does also have a Wagener grade 1 ulcer to the third metatarsal head on the right has been full weightbearing in postop shoes.    Ortho Exam On examination of the left transmetatarsal amputation there is healing laterally and medially however the center has not yet healed this is 5 mm in diameter 5 mm deep.  There is necrotic tissue in the wound bed.  There is no active drainage no odor he does have some mild erythema to the dorsum of his foot no warmth.  Has a faintly palpable dorsalis pedis pulse.  On examination of the right foot there is a heel cord contracture with dorsiflexion to neutral he has a Designer, fashion/clothing grade 1 ulcer beneath the third metatarsal head this is 4 mm in diameter this was parted with a 10 blade knife back to viable tissue he also has callus buildup over the fifth metatarsal head.  There is no erythema no odor no drainage no sign of infection  Visit Diagnoses: No diagnosis found.  Plan: Continue daily Dial soap cleansing and dry dressing changes.  Encouraged him to change the dressing daily.  We will call in a prescription for Bactroban for dressing changes.  We will also call in a prescription for nitroglycerin patches to use over the dorsum of his foot.  Will refer to vascular for ABIs as well as a visit.  Have provided a prescription to [facility] for custom orthotics with a carbon fiber plate for shoe wear he will follow-up in the office in 2 weeks.  In the meantime encouraged him to minimize his weightbearing of both feet.  Follow-Up Instructions: No follow-ups on  file.   Imaging: No results found.  Orders:  No orders of the defined types were placed in this encounter.  No orders of the defined types were placed in this encounter.    PMFS History: Patient Active Problem List   Diagnosis Date Noted  . Diabetes mellitus type II, non insulin dependent (HCC) 07/09/2017  . Diabetic foot ulcer (HCC) 07/09/2017  . Osteomyelitis of left foot (HCC) 07/09/2017  . Hypokalemia 07/09/2017  . Hyponatremia 07/09/2017  . AKI (acute kidney injury) (HCC) 07/09/2017  . Symptomatic anemia 07/09/2017  . Essential hypertension 07/09/2017  . Depression with anxiety 07/09/2017  . Venous thrombosis 07/09/2017   Past Medical History:  Diagnosis Date  . ADHD   . Arthritis   . Asthma   . Diabetes mellitus   . 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 Hypertension   . Myocardial infarction [facility])    ? 2008,  states he did not have a cath done and never saw a cardiologist  . Pancreatitis     History reviewed. No pertinent family history.  Past Surgical History:  Procedure Laterality Date  . AMPUTATION Left 11/09/2018   Procedure: LEFT TRANSMETATARSAL AMPUTATION;  Surgeon: Nadara Mustard, MD;  [facility];  Service: Orthopedics;  Laterality: Left;  . APPENDECTOMY    . FOOT SURGERY Left   . LAPAROSCOPIC APPENDECTOMY  07/28/2011   Procedure: APPENDECTOMY LAPAROSCOPIC;  Surgeon: Donato Heinz, MD;  Location: [facility];  Service: General;  Laterality: N/A;  . LEG SURGERY     Social History   Occupational History  . Not on file  Tobacco Use  . Smoking status: Never Smoker  . Smokeless tobacco: Never Used  Substance and Sexual Activity  . Alcohol use: Yes    Comment: occasionally  . Drug use: No  . Sexual activity: Not on file

## 2018-12-09 ENCOUNTER — Telehealth: Payer: Self-pay | Admitting: *Deleted

## 2018-12-09 NOTE — Telephone Encounter (Signed)
-----   Message from Pamella Pert, [place] sent at 12/07/2018  3:57 PM EDT ----- Regarding: help please Junie Panning entered a referral to [facility] they called and lm on vm to advise that the pt was a medicaid pt and that he will nee prior auth before they can set him up for a visit. Is this something that you would do?

## 2018-12-09 NOTE — Telephone Encounter (Signed)
Authorization for [facility] [address] for [place] # [id number] valid 12/09/18-06/07/19 approved at [address]; ordering provider Dondra Prader

## 2018-12-13 ENCOUNTER — Other Ambulatory Visit: Payer: Self-pay | Admitting: Family

## 2018-12-13 MED ORDER — DOXYCYCLINE HYCLATE 100 MG PO TABS: 100.0000 mg | ORAL_TABLET | Freq: Two times a day (BID) | ORAL | 0 refills | Status: DC

## 2018-12-21 ENCOUNTER — Encounter: Payer: Self-pay | Admitting: Family

## 2018-12-21 ENCOUNTER — Ambulatory Visit (INDEPENDENT_AMBULATORY_CARE_PROVIDER_SITE_OTHER): Payer: Medicaid Other | Admitting: Family

## 2018-12-21 VITALS — Ht 72.0 in | Wt 240.0 lb

## 2018-12-21 DIAGNOSIS — Z89432 Acquired absence of left foot: Secondary | ICD-10-CM

## 2018-12-21 NOTE — Progress Notes (Signed)
Post-Op Visit Note   Patient: [name]           Date of Birth: [birth year]           MRN: [id number] Visit Date: 12/21/2018 PCP: Abran Richard, MD  Chief Complaint:  Chief Complaint  Patient presents with  . Left Foot - Routine Post Op    11/09/18 left foot transmet amputation     HPI:  HPI The patient is a 53 year old gentleman seen today 6 weeks status post transmetatarsal amputation on the left.  He has a central area of his incision that has not yet healed complains of some dark drainage.  He has been full weightbearing with a postop shoe and a cane.  He continues taking doxycycline and doing daily Bactroban dressing changes.  Ortho Exam On examination of his left foot there is mild edema of the whole foot the incision is well-healed medially and laterally centrally there is an open area that is 1 cm in length 5 mm deep.  This has granulation.  This does not probe to bone or tendon there is no active drainage there is mild surrounding erythema no odor no warmth has a palpable dorsalis pedis pulse.  Visit Diagnoses:  1. Status post transmetatarsal amputation of foot, left ([place])     Plan: Placed a collagen dressing in the wound today.  He will continue with daily Dial soap cleansing mupirocin dressing changes.  Discussed elevation and minimizing weightbearing he will follow-up in the office in 4 weeks.  Discussed return precautions.  Follow-Up Instructions: Return in about 2 weeks (around 01/04/2019).   Imaging: No results found.  Orders:  No orders of the defined types were placed in this encounter.  No orders of the defined types were placed in this encounter.    PMFS History: Patient Active Problem List   Diagnosis Date Noted  . Diabetes mellitus type II, non insulin dependent ([place]) 07/09/2017  . Osteomyelitis of left foot ([place]) 07/09/2017  . Hypokalemia 07/09/2017  . Hyponatremia 07/09/2017  . AKI (acute kidney injury) ([place]) 07/09/2017  . Symptomatic  anemia 07/09/2017  . Essential hypertension 07/09/2017  . Depression with anxiety 07/09/2017  . Venous thrombosis 07/09/2017   Past Medical History:  Diagnosis Date  . ADHD   . Arthritis   . Asthma   . Diabetes mellitus   . Diabetic foot ulcer ([place]) 07/09/2017  . Diabetic ulcer of back associated with type 2 diabetes mellitus, limited to breakdown of skin ([place])   . Hypertension   . Myocardial infarction [facility])    ? 2008,  states he did not have a cath done and never saw a cardiologist  . Pancreatitis     History reviewed. No pertinent family history.  Past Surgical History:  Procedure Laterality Date  . AMPUTATION Left 11/09/2018   Procedure: LEFT TRANSMETATARSAL AMPUTATION;  Surgeon: Newt Minion, MD;  Location: [place];  Service: Orthopedics;  Laterality: Left;  . APPENDECTOMY    . FOOT SURGERY Left   . LAPAROSCOPIC APPENDECTOMY  07/28/2011   Procedure: APPENDECTOMY LAPAROSCOPIC;  Surgeon: Donato Heinz, MD;  Location: [facility];  Service: General;  Laterality: N/A;  . LEG SURGERY     Social History   Occupational History  . Not on file  Tobacco Use  . Smoking status: Never Smoker  . Smokeless tobacco: Never Used  Substance and Sexual Activity  . Alcohol use: Yes    Comment: occasionally  . Drug use: No  . Sexual  activity: Not on file

## 2018-12-27 ENCOUNTER — Other Ambulatory Visit: Payer: Self-pay

## 2018-12-27 ENCOUNTER — Ambulatory Visit (HOSPITAL_COMMUNITY)
Admission: RE | Admit: 2018-12-27 | Discharge: 2018-12-27 | Disposition: A | Discharge status: Home / Self Care | Payer: Medicaid Other | Source: Ambulatory Visit | Attending: Family | Admitting: Family

## 2018-12-27 DIAGNOSIS — Z89432 Acquired absence of left foot: Secondary | ICD-10-CM | POA: Diagnosis not present

## 2018-12-27 DIAGNOSIS — E11621 Type 2 diabetes mellitus with foot ulcer: Secondary | ICD-10-CM | POA: Diagnosis not present

## 2018-12-27 DIAGNOSIS — L97524 Non-pressure chronic ulcer of other part of left foot with necrosis of bone: Secondary | ICD-10-CM | POA: Diagnosis not present

## 2018-12-28 ENCOUNTER — Telehealth: Payer: Self-pay | Admitting: Orthopedic Surgery

## 2018-12-28 NOTE — Telephone Encounter (Signed)
I called pt and he advised that he had his vascular studies yesterday and that he is wanting to know what the next steps are. I advised that Dr. Sharol Given and erin are out of the office until Tuesday and that I will hold this message and see what it is he is to do next. Pt voiced understanding and will call with any questions.

## 2018-12-28 NOTE — Telephone Encounter (Signed)
Patient called. Would like his results from his test. His call back number is  [phone]. Thanks

## 2019-01-03 NOTE — Telephone Encounter (Signed)
This pt had vascular studies and was calling for results and for advisement on what next steps are. You ordered these tests do you want to call?

## 2019-01-10 ENCOUNTER — Ambulatory Visit (INDEPENDENT_AMBULATORY_CARE_PROVIDER_SITE_OTHER): Payer: Medicaid Other | Admitting: Family

## 2019-01-10 ENCOUNTER — Encounter: Payer: Self-pay | Admitting: Family

## 2019-01-10 VITALS — Ht 72.0 in | Wt 240.0 lb

## 2019-01-10 DIAGNOSIS — E11621 Type 2 diabetes mellitus with foot ulcer: Secondary | ICD-10-CM

## 2019-01-10 DIAGNOSIS — Z89432 Acquired absence of left foot: Secondary | ICD-10-CM | POA: Diagnosis not present

## 2019-01-10 DIAGNOSIS — L97524 Non-pressure chronic ulcer of other part of left foot with necrosis of bone: Secondary | ICD-10-CM

## 2019-01-10 NOTE — Progress Notes (Signed)
Post-Op Visit Note   Patient: [name]           Date of Birth: [birth year]           MRN: [id number] Visit Date: 01/10/2019 PCP: Abran Richard, MD  Chief Complaint:  Chief Complaint  Patient presents with  . Left Foot - Routine Post Op    7/22/2020Left TMA    HPI:  HPI The patient is a 53 year old gentleman seen today status post transmetatarsal amputation on the left.  He has a central area of his incision that has not yet healed with surrounding callus.  He has been full weightbearing with a postop shoe and a cane. He has been doing daily Bactroban dressing changes.  Has wagner grade one ulcer to right foot as well.  He is currently in bilateral post op shoes which have broken down.  Ortho Exam On examination of his left foot there is mild edema of the whole foot the incision is well-healed medially and laterally. centrally there is an open area that is 1 cm in length, 2 mm wide. No depth.  This has bleeding granulation.  No surrounding erythema no odor no warmth has a palpable dorsalis pedis pulse. Right foot with plantar ulcer to 3rd mt head. Is 3 cm in diameter with surrounding callus and maceration. After informed consent both feet were trimmed back to viable tissue. Silver nitrate was used for hemostasis. Right foot with no drainage. No surrounding erythema or sign of infection. Voiced relief of pain after trimming right.   Visit Diagnoses:  1. Status post transmetatarsal amputation of foot, left ([place])   2. Diabetic ulcer of left foot associated with type 2 diabetes mellitus, with necrosis of bone, unspecified part of foot ([place])     Plan: He will continue with daily Dial soap cleansing mupirocin dressing changes.  Discussed elevation and minimizing weightbearing he will follow-up in the office in 4 weeks.  Discussed return precautions. Given new post op shoes. Given an order for custom orthotics with carbon fiber plate, and spacer to [employer].  Follow-Up Instructions:  Return in about 4 weeks (around 02/07/2019).   Imaging: No results found.  Orders:  No orders of the defined types were placed in this encounter.  No orders of the defined types were placed in this encounter.    PMFS History: Patient Active Problem List   Diagnosis Date Noted  . Diabetes mellitus type II, non insulin dependent ([place]) 07/09/2017  . Osteomyelitis of left foot ([place]) 07/09/2017  . Hypokalemia 07/09/2017  . Hyponatremia 07/09/2017  . AKI (acute kidney injury) ([place]) 07/09/2017  . Symptomatic anemia 07/09/2017  . Essential hypertension 07/09/2017  . Depression with anxiety 07/09/2017  . Venous thrombosis 07/09/2017   Past Medical History:  Diagnosis Date  . ADHD   . Arthritis   . Asthma   . Diabetes mellitus   . Diabetic foot ulcer ([place]) 07/09/2017  . Diabetic ulcer of back associated with type 2 diabetes mellitus, limited to breakdown of skin ([place])   . Hypertension   . Myocardial infarction [facility])    ? 2008,  states he did not have a cath done and never saw a cardiologist  . Pancreatitis     History reviewed. No pertinent family history.  Past Surgical History:  Procedure Laterality Date  . AMPUTATION Left 11/09/2018   Procedure: LEFT TRANSMETATARSAL AMPUTATION;  Surgeon: Newt Minion, MD;  Location: [place];  Service: Orthopedics;  Laterality: Left;  . APPENDECTOMY    .  FOOT SURGERY Left   . LAPAROSCOPIC APPENDECTOMY  07/28/2011   Procedure: APPENDECTOMY LAPAROSCOPIC;  Surgeon: Fabio Bering, MD;  Location: [facility];  Service: General;  Laterality: N/A;  . LEG SURGERY     Social History   Occupational History  . Not on file  Tobacco Use  . Smoking status: Never Smoker  . Smokeless tobacco: Never Used  Substance and Sexual Activity  . Alcohol use: Yes    Comment: occasionally  . Drug use: No  . Sexual activity: Not on file

## 2019-01-11 ENCOUNTER — Ambulatory Visit: Payer: Medicaid Other | Admitting: Family

## 2019-01-26 ENCOUNTER — Encounter: Payer: Medicaid Other | Admitting: Vascular Surgery

## 2019-02-07 ENCOUNTER — Ambulatory Visit (INDEPENDENT_AMBULATORY_CARE_PROVIDER_SITE_OTHER): Payer: Medicaid Other

## 2019-02-07 ENCOUNTER — Encounter: Payer: Self-pay | Admitting: Family

## 2019-02-07 ENCOUNTER — Ambulatory Visit (INDEPENDENT_AMBULATORY_CARE_PROVIDER_SITE_OTHER): Payer: Medicaid Other | Admitting: Family

## 2019-02-07 ENCOUNTER — Other Ambulatory Visit: Payer: Self-pay

## 2019-02-07 VITALS — Ht 72.0 in | Wt 240.0 lb

## 2019-02-07 DIAGNOSIS — M5442 Lumbago with sciatica, left side: Secondary | ICD-10-CM

## 2019-02-07 DIAGNOSIS — Z89432 Acquired absence of left foot: Secondary | ICD-10-CM

## 2019-02-07 DIAGNOSIS — M86272 Subacute osteomyelitis, left ankle and foot: Secondary | ICD-10-CM | POA: Diagnosis not present

## 2019-02-07 DIAGNOSIS — G8929 Other chronic pain: Secondary | ICD-10-CM

## 2019-02-07 DIAGNOSIS — M5441 Lumbago with sciatica, right side: Secondary | ICD-10-CM

## 2019-02-07 MED ORDER — PREDNISONE 50 MG PO TABS: ORAL_TABLET | ORAL | 0 refills | Status: DC

## 2019-02-07 NOTE — Progress Notes (Signed)
Post-Op Visit Note   Patient: [name]           Date of Birth: 05-22-1965           MRN: [id number] Visit Date: 02/07/2019 PCP: Abran Richard, MD  Chief Complaint:  Chief Complaint  Patient presents with  . Left Foot - Routine Post Op    11/09/2018 left TMA    HPI:  HPI The patient is a 53 year old gentleman seen today status post transmetatarsal amputation on the left.  Surgery was on July 22 of this year.  He has been doing mupirocin dressing changes and trying to limit his weightbearing.  He has followed with [employer] for fabrication of custom orthotics.  He has not yet received these.  Full weightbearing today in bilateral postop shoes there is no drainage from his wound he denies any swelling or redness.    He continues to have chronic low back pain this is new to me.  Today is complaining of bilateral anterior leg pain and a burning sensation and some tingling.  He states there is no position that provides him with any relief.    Ortho Exam On examination of his left foot there is mild edema of the whole foot continues to have some callus to the incision line with a central ulceration this is now 5 mm in diameter.  No depth.  Surrounding callus debrided with a 10 blade knife.  This has bleeding granulation.  No surrounding erythema no odor no warmth has a palpable dorsalis pedis pulse.    Visit Diagnoses:  1. Chronic bilateral low back pain with bilateral sciatica     Plan: He will continue with daily Dial soap cleansing mupirocin dressing changes.  Discussed elevation and minimizing weightbearing he will follow-up in the office in 4 weeks.   For his back will trial a prednisone burst. Will refer to dr. Ernestina Patches. Consider mri of lumbar spine if cannot get relief with pred or esi. Follow up for back prn.  Follow-Up Instructions: No follow-ups on file.   Imaging: No results found.  Orders:  Orders Placed This Encounter  Procedures  . XR Lumbar Spine 2-3 Views    Meds ordered this encounter  Medications  . predniSONE (DELTASONE) 50 MG tablet    Sig: Take once daily by mouth for 5 days.    Dispense:  5 tablet    Refill:  0     PMFS History: Patient Active Problem List   Diagnosis Date Noted  . Diabetes mellitus type II, non insulin dependent ([place]) 07/09/2017  . Osteomyelitis of left foot ([place]) 07/09/2017  . Hypokalemia 07/09/2017  . Hyponatremia 07/09/2017  . AKI (acute kidney injury) ([place]) 07/09/2017  . Symptomatic anemia 07/09/2017  . Essential hypertension 07/09/2017  . Depression with anxiety 07/09/2017  . Venous thrombosis 07/09/2017   Past Medical History:  Diagnosis Date  . ADHD   . Arthritis   . Asthma   . Diabetes mellitus   . Diabetic foot ulcer ([place]) 07/09/2017  . Diabetic ulcer of back associated with type 2 diabetes mellitus, limited to breakdown of skin ([place])   . Hypertension   . Myocardial infarction [facility])    ? 2008,  states he did not have a cath done and never saw a cardiologist  . Pancreatitis     No family history on file.  Past Surgical History:  Procedure Laterality Date  . AMPUTATION Left 11/09/2018   Procedure: LEFT TRANSMETATARSAL AMPUTATION;  Surgeon: Newt Minion,  MD;  [facility];  Service: Orthopedics;  Laterality: Left;  . APPENDECTOMY    . FOOT SURGERY Left   . LAPAROSCOPIC APPENDECTOMY  07/28/2011   Procedure: APPENDECTOMY LAPAROSCOPIC;  Surgeon: Fabio Bering, MD;  Location: [facility];  Service: General;  Laterality: N/A;  . LEG SURGERY     Social History   Occupational History  . Not on file  Tobacco Use  . Smoking status: Never Smoker  . Smokeless tobacco: Never Used  Substance and Sexual Activity  . Alcohol use: Yes    Comment: occasionally  . Drug use: No  . Sexual activity: Not on file

## 2019-02-08 ENCOUNTER — Ambulatory Visit: Payer: Medicaid Other | Admitting: Family

## 2019-03-10 ENCOUNTER — Ambulatory Visit: Payer: Medicaid Other | Admitting: Family

## 2019-03-14 ENCOUNTER — Ambulatory Visit: Payer: Medicaid Other | Admitting: Family

## 2019-03-23 ENCOUNTER — Emergency Department (HOSPITAL_COMMUNITY): Payer: Medicaid Other

## 2019-03-23 ENCOUNTER — Encounter (HOSPITAL_COMMUNITY): Payer: Self-pay

## 2019-03-23 ENCOUNTER — Other Ambulatory Visit: Payer: Self-pay

## 2019-03-23 ENCOUNTER — Emergency Department (HOSPITAL_COMMUNITY)
Admission: EM | Admit: 2019-03-23 | Discharge: 2019-03-23 | Disposition: A | Discharge status: Home / Self Care | Payer: Medicaid Other | Attending: Emergency Medicine | Admitting: Emergency Medicine

## 2019-03-23 DIAGNOSIS — Z79899 Other long term (current) drug therapy: Secondary | ICD-10-CM | POA: Diagnosis not present

## 2019-03-23 DIAGNOSIS — Z7984 Long term (current) use of oral hypoglycemic drugs: Secondary | ICD-10-CM | POA: Diagnosis not present

## 2019-03-23 DIAGNOSIS — E1165 Type 2 diabetes mellitus with hyperglycemia: Secondary | ICD-10-CM | POA: Diagnosis not present

## 2019-03-23 DIAGNOSIS — F10129 Alcohol abuse with intoxication, unspecified: Secondary | ICD-10-CM | POA: Diagnosis not present

## 2019-03-23 DIAGNOSIS — Y902 Blood alcohol level of 40-59 mg/100 ml: Secondary | ICD-10-CM | POA: Insufficient documentation

## 2019-03-23 DIAGNOSIS — I10 Essential (primary) hypertension: Secondary | ICD-10-CM | POA: Insufficient documentation

## 2019-03-23 DIAGNOSIS — R000 Tachycardia, unspecified: Secondary | ICD-10-CM

## 2019-03-23 DIAGNOSIS — F1010 Alcohol abuse, uncomplicated: Secondary | ICD-10-CM

## 2019-03-23 DIAGNOSIS — F1510 Other stimulant abuse, uncomplicated: Secondary | ICD-10-CM | POA: Insufficient documentation

## 2019-03-23 DIAGNOSIS — Z7982 Long term (current) use of aspirin: Secondary | ICD-10-CM | POA: Diagnosis not present

## 2019-03-23 DIAGNOSIS — J449 Chronic obstructive pulmonary disease, unspecified: Secondary | ICD-10-CM | POA: Insufficient documentation

## 2019-03-23 DIAGNOSIS — R739 Hyperglycemia, unspecified: Secondary | ICD-10-CM

## 2019-03-23 HISTORY — DX: Polyneuropathy, unspecified: G62.9

## 2019-03-23 HISTORY — DX: Cerebral infarction, unspecified: I63.9

## 2019-03-23 HISTORY — DX: Chronic obstructive pulmonary disease, unspecified: J44.9

## 2019-03-23 HISTORY — DX: Gout, unspecified: M10.9

## 2019-03-23 LAB — CBC WITH DIFFERENTIAL/PLATELET
Abs Immature Granulocytes: 0.03 10*3/uL (ref 0.00–0.07)
Basophils Absolute: 0 10*3/uL (ref 0.0–0.1)
Basophils Relative: 0 %
Eosinophils Absolute: 0 10*3/uL (ref 0.0–0.5)
Eosinophils Relative: 0 %
HCT: 37 % — ABNORMAL LOW (ref 39.0–52.0)
Hemoglobin: 12.7 g/dL — ABNORMAL LOW (ref 13.0–17.0)
Immature Granulocytes: 0 %
Lymphocytes Relative: 10 %
Lymphs Abs: 0.7 10*3/uL (ref 0.7–4.0)
MCH: 31.9 pg (ref 26.0–34.0)
MCHC: 34.3 g/dL (ref 30.0–36.0)
MCV: 93 fL (ref 80.0–100.0)
Monocytes Absolute: 0.4 10*3/uL (ref 0.1–1.0)
Monocytes Relative: 5 %
Neutro Abs: 6 10*3/uL (ref 1.7–7.7)
Neutrophils Relative %: 85 %
Platelets: 208 10*3/uL (ref 150–400)
RBC: 3.98 MIL/uL — ABNORMAL LOW (ref 4.22–5.81)
RDW: 12.8 % (ref 11.5–15.5)
WBC: 7.2 10*3/uL (ref 4.0–10.5)
nRBC: 0 % (ref 0.0–0.2)

## 2019-03-23 LAB — BASIC METABOLIC PANEL
Anion gap: 13 (ref 5–15)
BUN: 9 mg/dL (ref 6–20)
CO2: 23 mmol/L (ref 22–32)
Calcium: 8.8 mg/dL — ABNORMAL LOW (ref 8.9–10.3)
Chloride: 93 mmol/L — ABNORMAL LOW (ref 98–111)
Creatinine, Ser: 0.6 mg/dL — ABNORMAL LOW (ref 0.61–1.24)
GFR calc Af Amer: >60 mL/min (ref >60)
GFR calc non Af Amer: >60 mL/min (ref >60)
Glucose, Bld: 275 mg/dL — ABNORMAL HIGH (ref 70–99)
Potassium: 3.9 mmol/L (ref 3.5–5.1)
Sodium: 129 mmol/L — ABNORMAL LOW (ref 135–145)

## 2019-03-23 LAB — D-DIMER, QUANTITATIVE: D-Dimer, Quant: 0.6 ug/mL-FEU — ABNORMAL HIGH (ref 0.00–0.50)

## 2019-03-23 LAB — RAPID URINE DRUG SCREEN, HOSP PERFORMED
Amphetamines: POSITIVE — AB
Barbiturates: NOT DETECTED
Benzodiazepines: NOT DETECTED
Cocaine: NOT DETECTED
Opiates: NOT DETECTED
Tetrahydrocannabinol: NOT DETECTED

## 2019-03-23 LAB — ETHANOL: Alcohol, Ethyl (B): 46 mg/dL — ABNORMAL HIGH (ref <10)

## 2019-03-23 LAB — URINALYSIS, ROUTINE W REFLEX MICROSCOPIC
Bacteria, UA: NONE SEEN
Bilirubin Urine: NEGATIVE
Glucose, UA: >=500 mg/dL — AB
Hgb urine dipstick: NEGATIVE
Ketones, ur: NEGATIVE mg/dL
Leukocytes,Ua: NEGATIVE
Nitrite: NEGATIVE
Protein, ur: NEGATIVE mg/dL
Specific Gravity, Urine: 1.012 (ref 1.005–1.030)
pH: 5 (ref 5.0–8.0)

## 2019-03-23 LAB — CBG MONITORING, ED: Glucose-Capillary: 294 mg/dL — ABNORMAL HIGH (ref 70–99)

## 2019-03-23 LAB — TROPONIN I (HIGH SENSITIVITY)
Troponin I (High Sensitivity): 3 ng/L (ref <18)
Troponin I (High Sensitivity): 4 ng/L (ref <18)

## 2019-03-23 MED ORDER — LORAZEPAM 2 MG/ML IJ SOLN
0.5000 mg | Freq: Once | INTRAMUSCULAR | Status: AC
  Administered 2019-03-23: 0.5 mg via INTRAVENOUS
  Filled 2019-03-23: qty 1

## 2019-03-23 MED ORDER — LORAZEPAM 1 MG PO TABS
1.0000 mg | ORAL_TABLET | Freq: Once | ORAL | Status: AC
  Administered 2019-03-23: 1 mg via ORAL
  Filled 2019-03-23: qty 1

## 2019-03-23 MED ORDER — IOHEXOL 350 MG/ML SOLN
100.0000 mL | Freq: Once | INTRAVENOUS | Status: AC | PRN
  Administered 2019-03-23: 100 mL via INTRAVENOUS

## 2019-03-23 NOTE — ED Provider Notes (Signed)
[facility] EMERGENCY DEPARTMENT Provider Note   CSN: [id number] Arrival date & time: 03/23/19  9735     History   Chief Complaint Chief Complaint  Patient presents with   Hyperglycemia   Hypertension    HPI [name] is a 53 y.o. male with a history significant for COPD, diabetes, hypertension, MI and history of stroke, also history of pancreatitis and chronic alcohol use disorder, reports drinking 16-18 beers most days, also smoked marijuana yesterday evening, presenting with increased pulse rate, tremor and lightheadedness this morning.  He reports barely slept last night, but states this is not uncommon as he has episodes of insomnia.  He arrived by EMS, was very anxious and hyperventilating upon EMS first arrival which continues upon arrival here.  He denies chest pain, shortness of breath, abdominal pain, nausea, vomiting, focal weakness or headache.  However, he states "I just do not feel right".     HPI  Past Medical History:  Diagnosis Date   ADHD    Arthritis    Asthma    COPD (chronic obstructive pulmonary disease) ([place])    Diabetes mellitus    Diabetic foot ulcer ([place]) 07/09/2017   Diabetic ulcer of back associated with type 2 diabetes mellitus, limited to breakdown of skin ([place])    Gout    Hypertension    Myocardial infarction ([place])    ? 2008,  states he did not have a cath done and never saw a cardiologist   Neuropathy    Pancreatitis    Stroke [facility])    TIA    Patient Active Problem List   Diagnosis Date Noted   Diabetes mellitus type II, non insulin dependent ([place]) 07/09/2017   Osteomyelitis of left foot ([place]) 07/09/2017   Hypokalemia 07/09/2017   Hyponatremia 07/09/2017   AKI (acute kidney injury) ([place]) 07/09/2017   Symptomatic anemia 07/09/2017   Essential hypertension 07/09/2017   Depression with anxiety 07/09/2017   Venous thrombosis 07/09/2017    Past Surgical History:  Procedure Laterality Date   AMPUTATION  Left 11/09/2018   Procedure: LEFT TRANSMETATARSAL AMPUTATION;  Surgeon: Newt Minion, MD;  Location: [place];  Service: Orthopedics;  Laterality: Left;   APPENDECTOMY     FOOT SURGERY Left    LAPAROSCOPIC APPENDECTOMY  07/28/2011   Procedure: APPENDECTOMY LAPAROSCOPIC;  Surgeon: Donato Heinz, MD;  Location: [facility];  Service: General;  Laterality: N/A;   LEG SURGERY          Home Medications    Prior to Admission medications   Medication Sig Start Date End Date Taking? Authorizing Provider  albuterol (PROVENTIL HFA;VENTOLIN HFA) 108 (90 BASE) MCG/ACT inhaler Inhale 2 puffs into the lungs every 6 (six) hours as needed. Asthma   Yes [provider]  amitriptyline (ELAVIL) 25 MG tablet Take 100 mg by mouth at bedtime.    Yes [provider]  amLODipine (NORVASC) 5 MG tablet Take 5 mg by mouth daily.   Yes [provider]  aspirin EC 81 MG tablet Take 81 mg by mouth daily.   Yes [provider]  doxycycline (VIBRA-TABS) 100 MG tablet Take 1 tablet (100 mg total) by mouth 2 (two) times daily. 12/13/18  Yes Dondra Prader R, NP  gabapentin (NEURONTIN) 600 MG tablet Take 600 mg by mouth 3 (three) times daily.   Yes [provider]  HUMULIN N 100 UNIT/ML injection Inject 10 Units into the skin every evening.  09/19/18  Yes [provider]  losartan (  COZAAR) 50 MG tablet Take 50 mg by mouth daily. 03/10/19  Yes [provider]  metFORMIN (GLUCOPHAGE) 1000 MG tablet Take 1,000 mg by mouth 2 (two) times daily with a meal.   Yes [provider]  mupirocin ointment (BACTROBAN) 2 % Apply 1 application topically daily. 12/07/18  Yes Adonis Huguenin, NP  nitroGLYCERIN (NITRODUR - DOSED IN MG/24 HR) 0.2 mg/hr patch Place 1 patch (0.2 mg total) onto the skin daily. 12/07/18  Yes Adonis Huguenin, NP  omeprazole (PRILOSEC) 20 MG capsule Take 20 mg by mouth daily.    Yes [provider]  Oxycodone HCl 10 MG TABS 10 mg every 8 (eight)  hours.  03/08/19  Yes [provider]  pravastatin (PRAVACHOL) 20 MG tablet Take 20 mg by mouth daily.  03/10/19  Yes [provider]  predniSONE (DELTASONE) 50 MG tablet Take once daily by mouth for 5 days. 02/07/19  Yes Barnie Del R, NP  spironolactone (ALDACTONE) 25 MG tablet Take 25 mg by mouth 2 (two) times a day. 10/18/18  Yes [provider]  Vitamin D, Ergocalciferol, (DRISDOL) 1.25 MG (50000 UT) CAPS capsule Take 50,000 Units by mouth every 7 (seven) days. Sunday   Yes [provider]  oxyCODONE-acetaminophen (PERCOCET/ROXICET) 5-325 MG tablet Take 1 tablet by mouth every 8 (eight) hours as needed. Patient not taking: Reported on 03/23/2019 12/05/18   Nadara Mustard, MD    Family History No family history on file.  Social History Social History   Tobacco Use   Smoking status: Never Smoker   Smokeless tobacco: Never Used  Substance Use Topics   Alcohol use: Yes    Comment: approx 15-16 beers   Drug use: Yes    Types: Marijuana     Allergies   Aspirin and Penicillins   Review of Systems Review of Systems  Constitutional: Negative for fever.  HENT: Negative for congestion and sore throat.   Eyes: Negative.   Respiratory: Negative for chest tightness and shortness of breath.   Cardiovascular: Positive for palpitations. Negative for chest pain.  Gastrointestinal: Negative for abdominal pain, nausea and vomiting.  Genitourinary: Negative.   Musculoskeletal: Negative for arthralgias, joint swelling and neck pain.  Skin: Negative.  Negative for rash and wound.  Neurological: Positive for tremors and light-headedness. Negative for dizziness, weakness, numbness and headaches.  Psychiatric/Behavioral: The patient is nervous/anxious.   All other systems reviewed and are negative.    Physical Exam Updated Vital Signs BP (!) 148/89    Pulse (!) 109    Temp 98 F (36.7 C) (Oral)    Resp 15    SpO2 100%   Physical Exam Vitals signs  and nursing note reviewed.  Constitutional:      General: He is in acute distress.     Appearance: He is well-developed. He is not toxic-appearing.     Comments: Anxious.  HENT:     Head: Normocephalic and atraumatic.     Mouth/Throat:     Mouth: Mucous membranes are dry.  Eyes:     Conjunctiva/sclera: Conjunctivae normal.  Neck:     Musculoskeletal: Normal range of motion.  Cardiovascular:     Rate and Rhythm: Regular rhythm. Tachycardia present.     Heart sounds: Normal heart sounds.  Pulmonary:     Effort: Pulmonary effort is normal. No respiratory distress.     Breath sounds: Normal breath sounds. No wheezing.  Abdominal:     General: Bowel sounds are normal. There is  no distension.     Palpations: Abdomen is soft.     Tenderness: There is no abdominal tenderness. There is no guarding.  Musculoskeletal: Normal range of motion.        General: No swelling or tenderness.     Right lower leg: No edema.     Left lower leg: No edema.  Skin:    General: Skin is warm and dry.  Neurological:     Mental Status: He is alert.      ED Treatments / Results  Labs (all labs ordered are listed, but only abnormal results are displayed) Labs Reviewed  CBC WITH DIFFERENTIAL/PLATELET - Abnormal; Notable for the following components:      Result Value   RBC 3.98 (*)    Hemoglobin 12.7 (*)    HCT 37.0 (*)    All other components within normal limits  BASIC METABOLIC PANEL - Abnormal; Notable for the following components:   Sodium 129 (*)    Chloride 93 (*)    Glucose, Bld 275 (*)    Creatinine, Ser 0.60 (*)    Calcium 8.8 (*)    All other components within normal limits  RAPID URINE DRUG SCREEN, HOSP PERFORMED - Abnormal; Notable for the following components:   Amphetamines POSITIVE (*)    All other components within normal limits  URINALYSIS, ROUTINE W REFLEX MICROSCOPIC - Abnormal; Notable for the following components:   Color, Urine STRAW (*)    Glucose, UA >=500 (*)    All  other components within normal limits  ETHANOL - Abnormal; Notable for the following components:   Alcohol, Ethyl (B) 46 (*)    All other components within normal limits  D-DIMER, QUANTITATIVE (NOT AT [facility]) - Abnormal; Notable for the following components:   D-Dimer, Quant 0.60 (*)    All other components within normal limits  CBG MONITORING, ED - Abnormal; Notable for the following components:   Glucose-Capillary 294 (*)    All other components within normal limits  CBG MONITORING, ED  TROPONIN I (HIGH SENSITIVITY)  TROPONIN I (HIGH SENSITIVITY)    EKG None  Radiology Ct Angio Chest Pe W And/or Wo Contrast  Result Date: 03/23/2019 CLINICAL DATA:  Shortness of breath and chest pain EXAM: CT ANGIOGRAPHY CHEST WITH CONTRAST TECHNIQUE: Multidetector CT imaging of the chest was performed using the standard protocol during bolus administration of intravenous contrast. Multiplanar CT image reconstructions and MIPs were obtained to evaluate the vascular anatomy. CONTRAST:  100mL OMNIPAQUE IOHEXOL 350 MG/ML SOLN COMPARISON:  Chest radiograph January 10, 2009 FINDINGS: Cardiovascular: There is no demonstrable pulmonary embolus. There is no thoracic aortic aneurysm or dissection. Visualized great vessels appear unremarkable. There is no pericardial effusion or pericardial thickening. Mediastinum/Nodes: Visualized thyroid appears unremarkable. There are subcentimeter mediastinal and axillary lymph nodes present. There is no adenopathy in the chest region by size criteria. There is a small hiatal hernia. Lungs/Pleura: There is atelectatic change in the posterior lung bases and in the inferior lingula. There is no edema or consolidation. On axial slice 72 series 6, there is a 4 mm nodular opacity in the superior segment of the left lower lobe. No evident pleural effusions. Upper Abdomen: There is hepatic steatosis. Liver is incompletely visualized but appears prominent. There is fatty infiltration  throughout much of the pancreas. Spleen appears upper normal in size. Musculoskeletal: There is degenerative change in the thoracic spine. There are no blastic or lytic bone lesions. No chest wall lesions are evident. Review of the  MIP images confirms the above findings. IMPRESSION: 1. No demonstrable pulmonary embolus. No thoracic aortic aneurysm or dissection. 2. Areas of atelectatic change. 4 mm nodular opacity in the left lower lobe. No follow-up needed if patient is low-risk. Non-contrast chest CT can be considered in 12 months if patient is high-risk. This recommendation follows the consensus statement: Guidelines for Management of Incidental Pulmonary Nodules Detected on CT Images: From the Fleischner Society 2017; Radiology 2017; 284:228-243. 3.  No evident adenopathy. 4.  Prominent appearing liver with hepatic steatosis. 5.  Fatty infiltration throughout much of the pancreas. Electronically Signed   By: Bretta Bang III M.D.   On: 03/23/2019 15:33    Procedures Procedures (including critical care time)  Medications Ordered in ED Medications  LORazepam (ATIVAN) injection 0.5 mg (0.5 mg Intravenous Given 03/23/19 1008)  iohexol (OMNIPAQUE) 350 MG/ML injection 100 mL (100 mLs Intravenous Contrast Given 03/23/19 1507)     Initial Impression / Assessment and Plan / ED Course  I have reviewed the triage vital signs and the nursing notes.  Pertinent labs & imaging results that were available during my care of the patient were reviewed by me and considered in my medical decision making (see chart for details).        Labs reviewed.  Recheck of pt, still tachycardic but feels improved, tremor resolved, no longer feels panicky.  Discussed lab results including UDS.  Pt reports snorting some "Ice" (meth) this am, has never tried this before, given him by a friend.  Sx started shortly afterward.    CT imaging resulted, no acute PE or other abnormal findings to suggest source of todays sx.   Most likely sx secondary to exposure to methamphetamine.  Pt was given information regarding substance abuse and resources in event he is desirous of assistance with this problem.  Also discussed pulmonary nodule and need for repeat imaging in 12 months (smokes marijuana).  Sx improved at time of dc.    Abnormal labs also include hyperglycemia but no anion gap.  Na corrected for cbg  132.   Final Clinical Impressions(s) / ED Diagnoses   Final diagnoses:  Methamphetamine use (HCC)  Tachycardia  Hyperglycemia  ETOH abuse    ED Discharge Orders    None       Victoriano Lain 03/23/19 1551    Donnetta Hutching, MD 03/24/19 2633    Donnetta Hutching, MD 03/24/19 [phone]

## 2019-03-23 NOTE — ED Notes (Addendum)
Pt states he did some meth and heroin last night. Normally drinks 6-7 40 ounce beers per day. Last drink was 0200 this morning edp notified.

## 2019-03-23 NOTE — ED Notes (Signed)
Patient yelling and screaming out of the room.  Advised patient we had several critical patients that had arrived at the same time and to just be patient.  Patient stated he wanted water.  Explained to patient that he had just arrived in E.D. and that he could not have anything to eat or drink until he was seen by doctor.    Patient stated his heartbeat was beating fast and I advised him this was due to the yelling and screaming - to please be patient and wait on the doctor.

## 2019-03-23 NOTE — ED Triage Notes (Addendum)
[facility] brought in due to elevated glucose at 341 and high blood pressure at 200/100. Pt very anxious on scene and was hyperventilating. Family reports drank approx 15-16 beers last night and smoked marijuana. Reports not taking any meds yesterday. Received 500 ml fluids last night

## 2019-03-23 NOTE — Discharge Instructions (Addendum)
Your work up today is negative for any acute medical problems.  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  This is a dangerous drug that I advise you stop using.  See the resource guide below for assistance resources if you choose to get help for this ( your drinking as well).   Your chest CT scan shows a small nodule in your left lung which is recommended you have a repeat CT scan in 1 year to make sure this is a stable (not a growing) finding. Discuss this with your primary MD so this will be ordered in a year.

## 2019-03-23 NOTE — ED Notes (Signed)
EDP at bedside  

## 2019-03-24 ENCOUNTER — Ambulatory Visit: Payer: Medicaid Other | Admitting: Family

## 2019-04-25 ENCOUNTER — Encounter: Payer: Self-pay | Admitting: Family

## 2019-04-25 ENCOUNTER — Ambulatory Visit (INDEPENDENT_AMBULATORY_CARE_PROVIDER_SITE_OTHER): Payer: Medicaid Other | Admitting: Family

## 2019-04-25 ENCOUNTER — Other Ambulatory Visit: Payer: Self-pay

## 2019-04-25 ENCOUNTER — Ambulatory Visit (INDEPENDENT_AMBULATORY_CARE_PROVIDER_SITE_OTHER): Payer: Medicaid Other

## 2019-04-25 VITALS — Ht 72.0 in | Wt 240.0 lb

## 2019-04-25 DIAGNOSIS — M79671 Pain in right foot: Secondary | ICD-10-CM

## 2019-04-25 MED ORDER — SULFAMETHOXAZOLE-TRIMETHOPRIM 800-160 MG PO TABS: 1.0000 | ORAL_TABLET | Freq: Two times a day (BID) | ORAL | 0 refills | Status: DC

## 2019-04-25 NOTE — Progress Notes (Signed)
Post-Op Visit Note   Patient: [name]           Date of Birth: [birth year]           MRN: [id number] Visit Date: 04/25/2019 PCP: Abran Richard, MD  Chief Complaint:  Chief Complaint  Patient presents with  . Left Foot - Follow-up    // transmet amputation   . Right Foot - Open Wound    HPI:  HPI The patient is a 54 year old gentleman seen today for concern of exquisite pain to his right foot this is over the fifth metatarsal head.  He is noticed for swelling no drainage no new wound.  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  He is also status post transmetatarsal amputation on the left this was in July 2020.  He has not yet fully healed.  Has tenderness over the medial border of his transmet and some over the dorsum of his foot this is been constant since surgery   Ortho Exam On examination of the right foot there is some very mild callus to the plantar aspect of the fifth metatarsal head there is no open area however he does have some fluctuance as well as tenderness.  Following palpation there is some serous drainage. No odor.  The Wagner grade 1 ulcers are both 5 mm in diameter 5 mm deep there is flat pink tissue nonviable surrounding callus debrided with a 10 blade knife back to viable tissue.  Palpable dorsalis pedis pulse.  On examination of his left foot there is mild edema of the whole foot continues to have some callus to the incision line with a central ulceration.  Nonviable tissue debrided with a 10 blade knife back to viable tissue there is very small central ulceration about half a centimeter in diameter this is filled with granulation there is no active drainage no surrounding erythema no warmth has a strong dorsalis pedis pulse.   Visit Diagnoses:  1. Pain in right foot     Plan: He will continue with daily Dial soap cleansing mupirocin dressing changes.  We will plan for irrigation debridement of abscess right foot.   Malachy Mood will call to plan surgery.  We will see him postop  Follow-Up Instructions: Return for Postop.   Imaging: XR Foot 2 Views Right  Result Date: 04/25/2019 Radiographs of the right foot without any acute finding   Orders:  Orders Placed This Encounter  Procedures  . XR Foot 2 Views Right   No orders of the defined types were placed in this encounter.    PMFS History: Patient Active Problem List   Diagnosis Date Noted  . Diabetes mellitus type II, non insulin dependent ([place]) 07/09/2017  . Osteomyelitis of left foot ([place]) 07/09/2017  . Hypokalemia 07/09/2017  . Hyponatremia 07/09/2017  . AKI (acute kidney injury) ([place]) 07/09/2017  . Symptomatic anemia 07/09/2017  . Essential hypertension 07/09/2017  . Depression with anxiety 07/09/2017  . Venous thrombosis 07/09/2017   Past Medical History:  Diagnosis Date  . ADHD   . Arthritis   . Asthma   . COPD (chronic obstructive pulmonary disease) ([place])   . Diabetes mellitus   . Diabetic foot ulcer ([place]) 07/09/2017  . Diabetic ulcer of back associated with type 2 diabetes mellitus, limited to breakdown of skin ([place])   . Gout   . Hypertension   . Myocardial infarction [facility])    ? 2008,  states he did  not have a cath done and never saw a cardiologist  . Neuropathy   . Pancreatitis   . Stroke [facility])    TIA    History reviewed. No pertinent family history.  Past Surgical History:  Procedure Laterality Date  . AMPUTATION Left 11/09/2018   Procedure: LEFT TRANSMETATARSAL AMPUTATION;  Surgeon: Nadara Mustard, MD;  Location: [facility] OR;  Service: Orthopedics;  Laterality: Left;  . APPENDECTOMY    . FOOT SURGERY Left   . LAPAROSCOPIC APPENDECTOMY  07/28/2011   Procedure: APPENDECTOMY LAPAROSCOPIC;  Surgeon: Fabio Bering, MD;  Location: [facility] ORS;  Service: General;  Laterality: N/A;  . LEG SURGERY     Social History   Occupational History  . Not on file  Tobacco Use  . Smoking status: Never Smoker  . Smokeless tobacco: Never Used   Substance and Sexual Activity  . Alcohol use: Yes    Comment: approx 15-16 beers  . Drug use: Yes    Types: Marijuana  . Sexual activity: Not on file

## 2019-04-27 ENCOUNTER — Encounter (HOSPITAL_COMMUNITY): Payer: Self-pay | Admitting: Physician Assistant

## 2019-04-27 ENCOUNTER — Encounter (HOSPITAL_COMMUNITY): Payer: Self-pay | Admitting: Orthopedic Surgery

## 2019-04-27 ENCOUNTER — Other Ambulatory Visit: Payer: Self-pay

## 2019-04-27 ENCOUNTER — Other Ambulatory Visit: Payer: Self-pay | Admitting: Physician Assistant

## 2019-04-27 NOTE — Progress Notes (Signed)
Nurse requested that PA. Anesthesiology, review pt history of ETOH abuse and drug use.

## 2019-04-27 NOTE — Anesthesia Preprocedure Evaluation (Deleted)
Anesthesia Evaluation    Airway        Dental   Pulmonary           Cardiovascular hypertension,      Neuro/Psych    GI/Hepatic   Endo/Other  diabetes  Renal/GU      Musculoskeletal   Abdominal   Peds  Hematology   Anesthesia Other Findings   Reproductive/Obstetrics                             Anesthesia Physical Anesthesia Plan  ASA:   Anesthesia Plan:    Post-op Pain Management:    Induction:   PONV Risk Score and Plan:   Airway Management Planned:   Additional Equipment:   Intra-op Plan:   Post-operative Plan:   Informed Consent:   Plan Discussed with:   Anesthesia Plan Comments: (History of polysubstance abuse. Seen at ED 03/23/19 for tachycardia, lightheadedness, and tremor secondary to methamphetamine use. Per ED note he also drinks 16-18 beers most days. Workup essentially benign, symptoms improved and he was discharged.   IDDMII, last A1c 9.7 on 11/09/18.  Recently underwent left transmetatarsal amputation 11/09/18 without complication.  EKG from ED visit 03/23/19 shows sinus tach rate 115 with early R wave transition.  TEE 06/08/17 (care everywhere): Interpretation Summary A complete two-dimensional transthoracic echocardiogram with color flow Doppler and spectral Doppler was performed. Saline contrast injection was performed. The left ventricle is normal in size, wall thickness and wall motion with ejection fraction of 55-60%. The aortic valve is trileaflet. Unable to adequately determine diastolic dysfunction. Injection of contrast documented no interatrial shunt .   Will need DOS labs and eval. )   Anesthesia Quick Evaluation

## 2019-04-27 NOTE — Progress Notes (Signed)
   04/27/19 1457  OBSTRUCTIVE SLEEP APNEA  Have you ever been diagnosed with sleep apnea through a sleep study? No  Do you snore loudly (loud enough to be heard through closed doors)?  1  Do you often feel tired, fatigued, or sleepy during the daytime (such as falling asleep during driving or talking to someone)? 0  Has anyone observed you stop breathing during your sleep? 1  Do you have, or are you being treated for high blood pressure? 1  BMI more than 35 kg/m2? 0  Age > 50 (1-yes) 1  Neck circumference greater than:Male 16 inches or larger, Male 17inches or larger?  (assess dos)  Male Gender (Yes=1) 1  Obstructive Sleep Apnea Score 5

## 2019-04-27 NOTE — Progress Notes (Signed)
Pt denies SOB, chest pain, and being under the care of a cardiologist. Pt stated that he is under the care of Dr. Durene Cal, PCP. Pt denies having a cardiac cath and stress test. Pt made aware to stop taking vitamins, fish oil and herbal medications. Do not take any NSAIDs ie: Ibuprofen, Advil, Naproxen (Aleve), Motrin, BC and Goody Powder. Pt made aware to take only 5 units of Humulin N insulin at HS and no Metformin on DOS. Pt made aware to check CBG every 2 hours prior to arrival to hospital on DOS. Pt made aware to treat a CBG < 70 with 4 ounces of apple juice, wait 15 minutes after intervention to recheck BG, if BG remains < 70, call Short Stay unit to speak with a nurse. Pt verbalized understanding of all pre-op instructions.

## 2019-04-27 NOTE — Progress Notes (Addendum)
Anesthesia Chart Review: Same day workup  History of polysubstance abuse. Seen at ED 03/23/19 for tachycardia, lightheadedness, and tremor secondary to methamphetamine use. Per ED note he also drinks 16-18 beers most days. Workup essentially benign, symptoms improved and he was discharged.   IDDMII, last A1c 9.7 on 11/09/18.  Recently underwent left transmetatarsal amputation 11/09/18 without complication.  EKG from ED visit 03/23/19 shows sinus tach rate 115 with early R wave transition.  Will need DOS labs and eval.   TEE 06/08/17 (care everywhere): Interpretation Summary A complete two-dimensional transthoracic echocardiogram with color flow Doppler and spectral Doppler was performed. Saline contrast injection was performed. The left ventricle is normal in size, wall thickness and wall motion with ejection fraction of 55-60%. The aortic valve is trileaflet. Unable to adequately determine diastolic dysfunction. Injection of contrast documented no interatrial shunt   Zannie Cove [facility] Short Stay Center/Anesthesiology Phone [phone] 04/27/2019 3:25 PM

## 2019-04-28 ENCOUNTER — Other Ambulatory Visit (HOSPITAL_COMMUNITY): Payer: Self-pay

## 2019-04-28 ENCOUNTER — Other Ambulatory Visit (HOSPITAL_COMMUNITY): Payer: Medicaid Other

## 2019-04-28 NOTE — Progress Notes (Signed)
LVM for pt to come on tomorrow 04/29/19 for covid testing, as he is one day too early for testing.

## 2019-04-29 ENCOUNTER — Other Ambulatory Visit (HOSPITAL_COMMUNITY): Payer: Medicaid Other | Attending: Orthopedic Surgery

## 2019-05-02 ENCOUNTER — Telehealth: Payer: Self-pay | Admitting: Orthopedic Surgery

## 2019-05-02 ENCOUNTER — Other Ambulatory Visit (HOSPITAL_COMMUNITY)
Admission: RE | Admit: 2019-05-02 | Discharge: 2019-05-02 | Disposition: A | Discharge status: Home / Self Care | Payer: Medicaid Other | Source: Ambulatory Visit | Attending: Orthopedic Surgery | Admitting: Orthopedic Surgery

## 2019-05-02 ENCOUNTER — Other Ambulatory Visit: Payer: Self-pay

## 2019-05-02 DIAGNOSIS — Z20822 Contact with and (suspected) exposure to covid-19: Secondary | ICD-10-CM | POA: Insufficient documentation

## 2019-05-02 DIAGNOSIS — Z01812 Encounter for preprocedural laboratory examination: Secondary | ICD-10-CM | POA: Diagnosis present

## 2019-05-02 LAB — SARS CORONAVIRUS 2 (TAT 6-24 HRS): SARS Coronavirus 2: NEGATIVE

## 2019-05-02 NOTE — Telephone Encounter (Signed)
Patient's wife Steward Drone called asked if patient will have to stay over night? Steward Drone asked how long will the surgery be? The number to contact Steward Drone is [phone]

## 2019-05-02 NOTE — Telephone Encounter (Signed)
Patient's wife was called and informed he will not have overnight stay and that patient's surgery should not last no more than 1 hour. Patient understood.

## 2019-05-03 ENCOUNTER — Encounter (HOSPITAL_COMMUNITY): Payer: Self-pay | Admitting: Orthopedic Surgery

## 2019-05-03 ENCOUNTER — Encounter (HOSPITAL_COMMUNITY)
Admission: RE | Disposition: A | Discharge status: Home / Self Care | Payer: Self-pay | Source: Home / Self Care | Attending: Orthopedic Surgery

## 2019-05-03 ENCOUNTER — Ambulatory Visit (HOSPITAL_COMMUNITY)
Admission: RE | Admit: 2019-05-03 | Discharge: 2019-05-03 | Disposition: A | Discharge status: Home / Self Care | Payer: Medicaid Other | Attending: Orthopedic Surgery | Admitting: Orthopedic Surgery

## 2019-05-03 ENCOUNTER — Other Ambulatory Visit: Payer: Self-pay

## 2019-05-03 DIAGNOSIS — M1990 Unspecified osteoarthritis, unspecified site: Secondary | ICD-10-CM | POA: Insufficient documentation

## 2019-05-03 DIAGNOSIS — M109 Gout, unspecified: Secondary | ICD-10-CM | POA: Insufficient documentation

## 2019-05-03 DIAGNOSIS — J45909 Unspecified asthma, uncomplicated: Secondary | ICD-10-CM | POA: Diagnosis not present

## 2019-05-03 DIAGNOSIS — I10 Essential (primary) hypertension: Secondary | ICD-10-CM | POA: Diagnosis not present

## 2019-05-03 DIAGNOSIS — J449 Chronic obstructive pulmonary disease, unspecified: Secondary | ICD-10-CM | POA: Insufficient documentation

## 2019-05-03 DIAGNOSIS — F419 Anxiety disorder, unspecified: Secondary | ICD-10-CM | POA: Diagnosis not present

## 2019-05-03 DIAGNOSIS — Z20822 Contact with and (suspected) exposure to covid-19: Secondary | ICD-10-CM | POA: Insufficient documentation

## 2019-05-03 DIAGNOSIS — Z8673 Personal history of transient ischemic attack (TIA), and cerebral infarction without residual deficits: Secondary | ICD-10-CM | POA: Diagnosis not present

## 2019-05-03 DIAGNOSIS — I252 Old myocardial infarction: Secondary | ICD-10-CM | POA: Insufficient documentation

## 2019-05-03 DIAGNOSIS — F909 Attention-deficit hyperactivity disorder, unspecified type: Secondary | ICD-10-CM | POA: Insufficient documentation

## 2019-05-03 DIAGNOSIS — E119 Type 2 diabetes mellitus without complications: Secondary | ICD-10-CM | POA: Diagnosis not present

## 2019-05-03 DIAGNOSIS — L02611 Cutaneous abscess of right foot: Secondary | ICD-10-CM | POA: Diagnosis not present

## 2019-05-03 HISTORY — DX: Presence of spectacles and contact lenses: Z97.3

## 2019-05-03 HISTORY — DX: Anxiety disorder, unspecified: F41.9

## 2019-05-03 HISTORY — DX: Cutaneous abscess of right foot: L02.611

## 2019-05-03 LAB — COMPREHENSIVE METABOLIC PANEL
ALT: 24 U/L (ref 0–44)
AST: 41 U/L (ref 15–41)
Albumin: 3.8 g/dL (ref 3.5–5.0)
Alkaline Phosphatase: 70 U/L (ref 38–126)
Anion gap: 12 (ref 5–15)
BUN: 6 mg/dL (ref 6–20)
CO2: 22 mmol/L (ref 22–32)
Calcium: 9 mg/dL (ref 8.9–10.3)
Chloride: 95 mmol/L — ABNORMAL LOW (ref 98–111)
Creatinine, Ser: 0.77 mg/dL (ref 0.61–1.24)
GFR calc Af Amer: >60 mL/min (ref >60)
GFR calc non Af Amer: >60 mL/min (ref >60)
Glucose, Bld: 238 mg/dL — ABNORMAL HIGH (ref 70–99)
Potassium: 4.1 mmol/L (ref 3.5–5.1)
Sodium: 129 mmol/L — ABNORMAL LOW (ref 135–145)
Total Bilirubin: 0.7 mg/dL (ref 0.3–1.2)
Total Protein: 7.6 g/dL (ref 6.5–8.1)

## 2019-05-03 LAB — CBC
HCT: 38.1 % — ABNORMAL LOW (ref 39.0–52.0)
Hemoglobin: 12.9 g/dL — ABNORMAL LOW (ref 13.0–17.0)
MCH: 31.5 pg (ref 26.0–34.0)
MCHC: 33.9 g/dL (ref 30.0–36.0)
MCV: 93.2 fL (ref 80.0–100.0)
Platelets: 175 10*3/uL (ref 150–400)
RBC: 4.09 MIL/uL — ABNORMAL LOW (ref 4.22–5.81)
RDW: 12.4 % (ref 11.5–15.5)
WBC: 10.1 10*3/uL (ref 4.0–10.5)
nRBC: 0 % (ref 0.0–0.2)

## 2019-05-03 LAB — GLUCOSE, CAPILLARY: Glucose-Capillary: 267 mg/dL — ABNORMAL HIGH (ref 70–99)

## 2019-05-03 SURGERY — IRRIGATION AND DEBRIDEMENT EXTREMITY
Anesthesia: Choice | Laterality: Right

## 2019-05-03 MED ORDER — CLINDAMYCIN PHOSPHATE 900 MG/50ML IV SOLN: 900.0000 mg | INTRAVENOUS | Status: DC

## 2019-05-03 MED ORDER — CHLORHEXIDINE GLUCONATE 4 % EX LIQD: 60.0000 mL | Freq: Once | CUTANEOUS | Status: DC

## 2019-05-03 MED ORDER — LACTATED RINGERS IV SOLN: INTRAVENOUS | Status: DC

## 2019-05-03 MED ORDER — CLINDAMYCIN PHOSPHATE 900 MG/50ML IV SOLN
INTRAVENOUS | Status: AC
  Filled 2019-05-03: qty 50

## 2019-05-03 NOTE — Progress Notes (Signed)
Dr. Hyacinth Meeker made aware of patient's blood pressure. No new orders received.

## 2019-05-03 NOTE — H&P (Signed)
[name] is an 54 y.o. male.   Chief Complaint: Abscess Right Foot HPI:  The patient is a 54 year old gentleman seen today for concern of exquisite pain to his right foot this is over the fifth metatarsal head.  He is noticed for swelling no drainage no new wound.  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  He is also status post transmetatarsal amputation on the left this was in July 2020.  He has not yet fully healed.  Has tenderness over the medial border of his transmet and some over the dorsum of his foot this is been constant since surgery Past Medical History:  Diagnosis Date  . Abscess of right foot   . ADHD   . Anxiety   . Arthritis   . Asthma   . COPD (chronic obstructive pulmonary disease) (HCC)   . Diabetes mellitus   . Diabetic foot ulcer (HCC) 07/09/2017  . 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 Gout   . Hypertension   . Myocardial infarction [facility])    ? 2008,  states he did not have a cath done and never saw a cardiologist  . Neuropathy   . Pancreatitis   . Stroke [facility])    TIA  . Wears glasses     Past Surgical History:  Procedure Laterality Date  . AMPUTATION Left 11/09/2018   Procedure: LEFT TRANSMETATARSAL AMPUTATION;  Surgeon: Nadara Mustard, MD;  Location: [facility] OR;  Service: Orthopedics;  Laterality: Left;  . APPENDECTOMY    . FOOT SURGERY Left   . LAPAROSCOPIC APPENDECTOMY  07/28/2011   Procedure: APPENDECTOMY LAPAROSCOPIC;  Surgeon: Fabio Bering, MD;  Location: [facility] ORS;  Service: General;  Laterality: N/A;  . LEG SURGERY      History reviewed. No pertinent family history. Social History:  reports that he has never smoked. He has never used smokeless tobacco. He reports current alcohol use. He reports current drug use. Drug: Marijuana.  Allergies:  Allergies  Allergen Reactions  . Aspirin Other (See Comments)    Patient says he's a free bleeder.    . Penicillins Hives and Other (See Comments)    Has patient had a PCN reaction causing immediate rash, facial/tongue/throat swelling, SOB or lightheadedness with hypotension: YES Has patient had a PCN reaction causing severe rash involving mucus membranes or skin necrosis: NO Has patient had a PCN reaction that required hospitalization: NO Has patient had a PCN reaction occurring within the last 10 years: NO If all of the above answers are "NO", then may proceed with Cephalosporin use.    No medications prior to admission.    Results for orders placed or performed during the hospital encounter of 05/02/19 (from the past 48 hour(s))  SARS CORONAVIRUS 2 (TAT 6-24 HRS) Nasopharyngeal Nasopharyngeal Swab     Status: None   Collection Time: 05/02/19  7:20 AM   Specimen: Nasopharyngeal Swab  Result Value Ref Range   SARS Coronavirus 2 NEGATIVE NEGATIVE    Comment: (NOTE) SARS-CoV-2 target nucleic acids are NOT DETECTED. The SARS-CoV-2 RNA is generally detectable in upper and lower respiratory specimens during the acute phase of infection. Negative results do not preclude SARS-CoV-2 infection, do not rule out co-infections with other pathogens, and should not be used as the sole basis for treatment or other patient management decisions. Negative results must be combined with clinical observations, patient history,  and epidemiological information. The expected result is Negative. Fact Sheet for Patients: SugarRoll.be Fact Sheet for Healthcare Providers: https://www.woods-mathews.com/ This test is not yet approved or cleared by the [place] FDA and  has been authorized for detection and/or diagnosis of SARS-CoV-2 by FDA under an Emergency Use Authorization (EUA). This EUA will remain  in effect (meaning this test can be used) for the duration of the COVID-19 declaration under Section 56 4(b)(1) of the Act, 21 U.S.C. section 360bbb-3(b)(1),  unless the authorization is terminated or revoked sooner. Performed at [facility], [place] [address] [place], [place] [address]    No results found.  Review of Systems  All other systems reviewed and are negative.   There were no vitals taken for this visit. Physical Exam  callus to the plantar aspect of the fifth metatarsal head there is no open area however he does have some fluctuance as well as tenderness.  Following palpation there is some serous drainage. No odor.  The Wagner grade 1 ulcers are both 5 mm in diameter 5 mm deep there is flat pink tissue nonviable surrounding callus debrided with a 10 blade knife back to viable tissue.  Palpable dorsalis pedis pulse.  On examination of his left foot there is mild edema of the whole foot continues to have some callus to the incision line with a central ulceration.  Nonviable tissue debrided with a 10 blade knife back to viable tissue there is very small central ulceration about half a centimeter in diameter this is filled with granulation there is no active drainage no surrounding erythema no warmth has a strong dorsalis pedis pulse.  Assessment/Plan  Plan: He will continue with daily Dial soap cleansing mupirocin dressing changes.  We will plan for irrigation debridement of abscess right foot.  [name] will call to plan surgery.  We will see him postop  William Palmer Ona Rathert, PA 05/03/2019, 6:55 AM

## 2019-05-03 NOTE — Progress Notes (Signed)
Patient found in hallway in clothes from home and stated "I'm leaving.  I need water."  IV was no longer in patient's left hand, tape was found over IV site.  Nurse escorted that patient back to room.  Patient stated "I understand Dr. Lajoyce Corners has other patients.  I'm not mad.  I just can't wait until 2:00.  I need water."  Patient appeared to be anxious, shaking, jittery, and would not sit.  Patient tried to leave the room again and stated "I'm going to find water."    Dr. Lajoyce Corners notified that patient was not wanting to wait for his surgery.  Dr. Hyacinth Meeker also made aware.  IV site addressed - gauze placed and secured with tape.  Site was clean, dry, and intact.  Patient given water.   Patient stated he would call Dr. Audrie Lia office and reschedule and he walked out the door.

## 2019-05-10 ENCOUNTER — Ambulatory Visit: Payer: Medicaid Other | Admitting: Family

## 2019-05-12 ENCOUNTER — Telehealth: Payer: Self-pay | Admitting: Family

## 2019-05-12 ENCOUNTER — Telehealth: Payer: Self-pay

## 2019-05-12 ENCOUNTER — Encounter: Payer: Self-pay | Admitting: Family

## 2019-05-12 ENCOUNTER — Other Ambulatory Visit: Payer: Self-pay

## 2019-05-12 ENCOUNTER — Telehealth: Payer: Self-pay | Admitting: Orthopedic Surgery

## 2019-05-12 ENCOUNTER — Ambulatory Visit (INDEPENDENT_AMBULATORY_CARE_PROVIDER_SITE_OTHER): Payer: Medicaid Other | Admitting: Family

## 2019-05-12 VITALS — Ht 72.0 in | Wt 240.0 lb

## 2019-05-12 DIAGNOSIS — E1142 Type 2 diabetes mellitus with diabetic polyneuropathy: Secondary | ICD-10-CM

## 2019-05-12 DIAGNOSIS — L97511 Non-pressure chronic ulcer of other part of right foot limited to breakdown of skin: Secondary | ICD-10-CM

## 2019-05-12 DIAGNOSIS — Z89432 Acquired absence of left foot: Secondary | ICD-10-CM | POA: Diagnosis not present

## 2019-05-12 NOTE — Telephone Encounter (Signed)
Patient called requesting pain medication. Patient states to be in severe pain. Patient asking for Autumn to give a call back. Patient phone number is  [phone].

## 2019-05-12 NOTE — Telephone Encounter (Signed)
Duplicate message. This has been sent to PA for review will sign off and call back once answered.

## 2019-05-12 NOTE — Progress Notes (Signed)
Office Visit Note   Patient: [name]           Date of Birth: 05/31/1965           MRN: [id number] Visit Date: 05/12/2019              Requested by: Abran Richard, MD [address] [place],  [place] [address] PCP: Abran Richard, MD  Chief Complaint  Patient presents with  . Left Foot - Follow-up  . Right Foot - Follow-up      HPI: The patient is a 53 year old gentleman who presents today for follow-up for bilateral foot pain with ulcerations.  On the left he is status post transmetatarsal amputation and has continued with chronic ulceration to the center of his amputation this has been ongoing for quite some time there is scant drainage some mild tenderness he is in bedroom slippers.  On the right foot he has had chronic ulceration beneath the second and third metatarsal heads however he also has had some ulceration to the fifth metatarsal head on last viewing on January 5 he had an abscess and surgery was planned however the patient was unable to go through with the surgery.  This has opened up and drained and has become a larger ulcer he continues to complain of quite exquisite tenderness over the lateral column and to the ulcerative area  Assessment & Plan: Visit Diagnoses:  1. Right foot ulcer, limited to breakdown of skin ([place])   2. Status post transmetatarsal amputation of foot, left ([place])   3. Diabetic polyneuropathy associated with type 2 diabetes mellitus ([place])     Plan: As the patient is in pain management will declined to refill his pain medication at this time.  Plan for MRI to evaluate right foot for osteomyelitis he will continue with his daily dose of cleansing and antibacterial ointment dressing changes.  Continue to offload the feet.  Given new postop shoes today as he has been ambulating and broken down bedroom slippers.  Follow-Up Instructions: Return in about 2 weeks (around 05/26/2019).   Ortho Exam  Patient is alert, oriented, no adenopathy,  well-dressed, normal affect, normal respiratory effort. On examination of the left transmetatarsal amputation the foot has some very mild edema there is no erythema no warmth he has buildup of callus this was debrided with a 10 blade knife back to viable tissue central ulceration just 5 mm in diameter there is 50% fibrinous tissue some flat pink tissue.  There is no drainage no odor no warmth.  To the right foot he has moderate edema there is pitting.  Does have a palpable dorsalis pedis pulse.  Beneath the second and third metatarsal heads he has callused ulceration there is central ulceration these are both 5 mm in diameter 5 mm deep there is granulation in the wound bed there is no active drainage surrounding nonviable tissue debrided with a 10 blade knife.  To the lateral column of the foot primarily over the fifth metatarsal head he has open ulceration this is 2 cm in diameter there is no active drainage there is granulation.  This is exquisitely tender there is no depth no probing  Imaging: No results found. No images are attached to the encounter.  Labs: Lab Results  Component Value Date   HGBA1C 9.7 (H) 11/09/2018   REPTSTATUS 07/14/2017 FINAL 07/09/2017   REPTSTATUS 07/14/2017 FINAL 07/09/2017   CULT  07/09/2017    NO GROWTH 5 DAYS Performed at [facility]  [facility], [address] [place], [place] [address]    CULT  07/09/2017    NO GROWTH 5 DAYS Performed at [facility], [address], [place], [place] [address]      Lab Results  Component Value Date   ALBUMIN 3.8 05/03/2019   ALBUMIN 3.0 (L) 07/09/2017   ALBUMIN 3.1 (L) 06/25/2017    Lab Results  Component Value Date   MG 1.4 (L) 07/11/2017   No results found for: VD25OH  No results found for: PREALBUMIN CBC EXTENDED Latest Ref Rng & Units 05/03/2019 03/23/2019 11/09/2018  WBC 4.0 - 10.5 K/uL 10.1 7.2 8.3  RBC 4.22 - 5.81 MIL/uL 4.09(L) 3.98(L) 4.29  HGB 13.0 - 17.0 g/dL 12.9(L) 12.7(L) 12.6(L)  HCT 39.0 - 52.0 % 38.1(L)  37.0(L) 38.0(L)  PLT 150 - 400 K/uL 175 208 219  NEUTROABS 1.7 - 7.7 K/uL - 6.0 -  LYMPHSABS 0.7 - 4.0 K/uL - 0.7 -     Body mass index is 32.55 kg/m.  Orders:  Orders Placed This Encounter  Procedures  . MR Foot Right w/o contrast   No orders of the defined types were placed in this encounter.    Procedures: No procedures performed  Clinical Data: No additional findings.  ROS:  All other systems negative, except as noted in the HPI. Review of Systems  Constitutional: Negative for chills and fever.  Skin: Positive for wound. Negative for color change.    Objective: Vital Signs: Ht 6' (1.829 m)   Wt 240 lb (108.9 kg)   BMI 32.55 kg/m   Specialty Comments:  No specialty comments available.  PMFS History: Patient Active Problem List   Diagnosis Date Noted  . Diabetes mellitus type II, non insulin dependent (HCC) 07/09/2017  . Osteomyelitis of left foot (HCC) 07/09/2017  . Hypokalemia 07/09/2017  . Hyponatremia 07/09/2017  . AKI (acute kidney injury) (HCC) 07/09/2017  . Symptomatic anemia 07/09/2017  . Essential hypertension 07/09/2017  . Depression with anxiety 07/09/2017  . Venous thrombosis 07/09/2017   Past Medical History:  Diagnosis Date  . Abscess of right foot   . ADHD   . Anxiety   . Arthritis   . Asthma   . COPD (chronic obstructive pulmonary disease) (HCC)   . Diabetes mellitus   . Diabetic foot ulcer (HCC) 07/09/2017  . 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 Gout   . Hypertension   . Myocardial infarction [facility])    ? 2008,  states he did not have a cath done and never saw a cardiologist  . Neuropathy   . Pancreatitis   . Stroke [facility])    TIA  . Wears glasses     History reviewed. No pertinent family history.  Past Surgical History:  Procedure Laterality Date  . AMPUTATION Left 11/09/2018   Procedure: LEFT TRANSMETATARSAL AMPUTATION;  Surgeon: Nadara Mustard, MD;   Location: [facility] OR;  Service: Orthopedics;  Laterality: Left;  . APPENDECTOMY    . FOOT SURGERY Left   . LAPAROSCOPIC APPENDECTOMY  07/28/2011   Procedure: APPENDECTOMY LAPAROSCOPIC;  Surgeon: Fabio Bering, MD;  Location: [facility] ORS;  Service: General;  Laterality: N/A;  . LEG SURGERY     Social History   Occupational History  . Not on file  Tobacco Use  . Smoking status: Never Smoker  . Smokeless tobacco: Never Used  Substance and Sexual Activity  . Alcohol use: Yes    Comment: daily  beer " I am gonna try to quit "  . Drug use: Yes    Types: Marijuana    Comment: last use 04/26/19  . Sexual activity: Not on file

## 2019-05-12 NOTE — Telephone Encounter (Signed)
Patient called.   He was seen earlier and was told his prescription would be sent to his pharmacy. He has not received it yet.   Call back number: [phone]

## 2019-05-12 NOTE — Telephone Encounter (Signed)
Pt was here today for eval right foot abscess. He is being sch for an MRi was supposed to have surgery last week but cancelled. He is asking for pain medication and this was supposed to be sent in and I do not see where [name] entered anything. Can you address this for the pt please?

## 2019-05-12 NOTE — Telephone Encounter (Signed)
Patient called concerning Rx for pain. Stated that Rx was to be sent to his pharmacy.  CB# is [phone].  Please advise.  Thank you.

## 2019-05-12 NOTE — Telephone Encounter (Signed)
Duplicate message. This has been sent to PA to address will sign off on this message and await advisement.

## 2019-05-12 NOTE — Telephone Encounter (Signed)
I called the patient today.  I also reviewed the last note when he saw Denny Peon this week.  He did request pain medication from her and she did discuss with him that because he is in pain management she would have to decline writing pain medications for him.  I did explain this to him in encouraged him to contact his pain management physician

## 2019-05-16 ENCOUNTER — Telehealth: Payer: Self-pay | Admitting: Orthopedic Surgery

## 2019-05-16 NOTE — Telephone Encounter (Signed)
Patient called asked for a call back needing time and date of surgery. The number to contact patient is [phone]

## 2019-05-16 NOTE — Telephone Encounter (Signed)
Denny Peon will you refill this medication or Do I need for the patient to call their PCP for this? This is not a antibiotic medication.

## 2019-05-16 NOTE — Telephone Encounter (Signed)
Yes, do you mind letting him know to have pcp refill this

## 2019-05-16 NOTE — Telephone Encounter (Signed)
Patient called and requesting antibiotics Aldactone.  Please call patient to advise.  [phone]

## 2019-05-17 NOTE — Telephone Encounter (Signed)
I spoke to [place].  It seems like [name] is waiting on an MRI, not surgery.

## 2019-05-17 NOTE — Telephone Encounter (Signed)
I tried to call patient and inform him of this and could not lvm.

## 2019-05-18 NOTE — Telephone Encounter (Signed)
I called pt and left vm stating it was MRI that is suppose to be scheduled not surgery and that I have gotten it approved and it has now been sent to [facility] for scheduling, they will contact pt to schedule appt

## 2019-06-01 ENCOUNTER — Ambulatory Visit (HOSPITAL_COMMUNITY)
Admission: RE | Admit: 2019-06-01 | Discharge: 2019-06-01 | Disposition: A | Discharge status: Home / Self Care | Payer: Medicaid Other | Source: Ambulatory Visit | Attending: Family | Admitting: Family

## 2019-06-01 ENCOUNTER — Other Ambulatory Visit: Payer: Self-pay

## 2019-06-01 DIAGNOSIS — L97511 Non-pressure chronic ulcer of other part of right foot limited to breakdown of skin: Secondary | ICD-10-CM | POA: Insufficient documentation

## 2019-06-01 DIAGNOSIS — E1142 Type 2 diabetes mellitus with diabetic polyneuropathy: Secondary | ICD-10-CM | POA: Diagnosis present

## 2019-06-07 ENCOUNTER — Ambulatory Visit (INDEPENDENT_AMBULATORY_CARE_PROVIDER_SITE_OTHER): Payer: Medicaid Other | Admitting: Family

## 2019-06-07 ENCOUNTER — Encounter: Payer: Self-pay | Admitting: Family

## 2019-06-07 ENCOUNTER — Other Ambulatory Visit: Payer: Self-pay

## 2019-06-07 VITALS — Ht 72.0 in | Wt 240.0 lb

## 2019-06-07 DIAGNOSIS — E1142 Type 2 diabetes mellitus with diabetic polyneuropathy: Secondary | ICD-10-CM | POA: Diagnosis not present

## 2019-06-07 DIAGNOSIS — L97511 Non-pressure chronic ulcer of other part of right foot limited to breakdown of skin: Secondary | ICD-10-CM

## 2019-06-07 DIAGNOSIS — Z89432 Acquired absence of left foot: Secondary | ICD-10-CM

## 2019-06-07 NOTE — Progress Notes (Signed)
Office Visit Note   Patient: [name]           Date of Birth: [birth year]           MRN: [id number] Visit Date: 06/07/2019              Requested by: Abran Richard, MD [address] [place],  [place] [address] PCP: Abran Richard, MD  Chief Complaint  Patient presents with  . Right Foot - Follow-up    MRI review right foot       HPI: The patient is a 54 year old gentleman who presents today for follow-up for bilateral foot pain with ulcerations.  On the left he is status post transmetatarsal amputation and has continued with chronic ulceration to the center of his amputation this has been ongoing for quite some time. On the right foot he has had chronic ulceration beneath the second and third metatarsal heads however he also has had some ulceration to the fifth metatarsal head.  The patient is status post MRI of the right foot on February 11.  Presents today for MRI review as well is routine wound check.  He has no new concerns.  Has been doing antibacterial ointment dressing changes bilaterally is ambulating full weightbearing in bilateral postop shoes.   Of note he is in pain management with [facility].  Assessment & Plan: Visit Diagnoses:  1. Right foot ulcer, limited to breakdown of skin ([place])   2. Status post transmetatarsal amputation of foot, left ([place])   3. Diabetic polyneuropathy associated with type 2 diabetes mellitus ([place])     Plan: He will continue with his current wound care cleansing daily antibacterial ointment dressing changes.  He will follow-up in the office in 4 weeks.  Discussed the importance of heel cord stretching and nonweightbearing. Follow-Up Instructions: No follow-ups on file.   Ortho Exam  Patient is alert, oriented, no adenopathy, well-dressed, normal affect, normal respiratory effort. On examination of the left transmetatarsal amputation the foot has some very mild edema there is no erythema no warmth he has buildup of  callus and nonviable tissue this was debrided with a 10 blade knife back to viable tissue central ulceration is now about 15 mm x 5 mm.  There is nearly full fibrinous exudative tissue this was debrided.  There is flat pink tissue.  There is no drainage no odor no warmth.  To the right foot he has minimal edema. Does have a palpable dorsalis pedis pulse.  Beneath the second and third metatarsal heads he has callused ulceration this is now 3 cm across by about 15 mm.  There are 2 separate areas of depth that are about 5 mm deep over the second and third metatarsal heads.  The surrounding nonviable tissue debrided with a 10 blade knife.  There is no active drainage no purulence no odor to the lateral column of the foot primarily over the fifth metatarsal head he has open ulceration this is 1 cm in diameter there is no active drainage there is granulation.  This has improved.  There continues to be some mild tenderness.  This is superficial there is no depth.  Imaging: No results found. No images are attached to the encounter.  Labs: Lab Results  Component Value Date   HGBA1C 9.7 (H) 11/09/2018   REPTSTATUS 07/14/2017 FINAL 07/09/2017   REPTSTATUS 07/14/2017 FINAL 07/09/2017   CULT  07/09/2017    NO GROWTH 5 DAYS Performed at [facility]  [address], [place], [place] [address]    CULT  07/09/2017    NO GROWTH 5 DAYS Performed at [facility], [address], [place], [place] [address]      Lab Results  Component Value Date   ALBUMIN 3.8 05/03/2019   ALBUMIN 3.0 (L) 07/09/2017   ALBUMIN 3.1 (L) 06/25/2017    Lab Results  Component Value Date   MG 1.4 (L) 07/11/2017   No results found for: VD25OH  No results found for: PREALBUMIN CBC EXTENDED Latest Ref Rng & Units 05/03/2019 03/23/2019 11/09/2018  WBC 4.0 - 10.5 K/uL 10.1 7.2 8.3  RBC 4.22 - 5.81 MIL/uL 4.09(L) 3.98(L) 4.29  HGB 13.0 - 17.0 g/dL 12.9(L) 12.7(L) 12.6(L)  HCT 39.0 - 52.0 % 38.1(L) 37.0(L) 38.0(L)  PLT 150 - 400  K/uL 175 208 219  NEUTROABS 1.7 - 7.7 K/uL - 6.0 -  LYMPHSABS 0.7 - 4.0 K/uL - 0.7 -     Body mass index is 32.55 kg/m.  Orders:  No orders of the defined types were placed in this encounter.  No orders of the defined types were placed in this encounter.    Procedures: No procedures performed  Clinical Data: No additional findings.  ROS:  All other systems negative, except as noted in the HPI. Review of Systems  Constitutional: Negative for chills and fever.  Skin: Positive for wound. Negative for color change.    Objective: Vital Signs: Ht 6' (1.829 m)   Wt 240 lb (108.9 kg)   BMI 32.55 kg/m   Specialty Comments:  No specialty comments available.  PMFS History: Patient Active Problem List   Diagnosis Date Noted  . Diabetes mellitus type II, non insulin dependent (HCC) 07/09/2017  . Osteomyelitis of left foot (HCC) 07/09/2017  . Hypokalemia 07/09/2017  . Hyponatremia 07/09/2017  . AKI (acute kidney injury) (HCC) 07/09/2017  . Symptomatic anemia 07/09/2017  . Essential hypertension 07/09/2017  . Depression with anxiety 07/09/2017  . Venous thrombosis 07/09/2017   Past Medical History:  Diagnosis Date  . Abscess of right foot   . ADHD   . Anxiety   . Arthritis   . Asthma   . COPD (chronic obstructive pulmonary disease) (HCC)   . Diabetes mellitus   . Diabetic foot ulcer (HCC) 07/09/2017  . 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 Gout   . Hypertension   . Myocardial infarction [facility])    ? 2008,  states he did not have a cath done and never saw a cardiologist  . Neuropathy   . Pancreatitis   . Stroke [facility])    TIA  . Wears glasses     History reviewed. No pertinent family history.  Past Surgical History:  Procedure Laterality Date  . AMPUTATION Left 11/09/2018   Procedure: LEFT TRANSMETATARSAL AMPUTATION;  Surgeon: Nadara Mustard, MD;  [facility];  Service: Orthopedics;   Laterality: Left;  . APPENDECTOMY    . FOOT SURGERY Left   . LAPAROSCOPIC APPENDECTOMY  07/28/2011   Procedure: APPENDECTOMY LAPAROSCOPIC;  Surgeon: Fabio Bering, MD;  Location: [facility];  Service: General;  Laterality: N/A;  . LEG SURGERY     Social History   Occupational History  . Not on file  Tobacco Use  . Smoking status: Never Smoker  . Smokeless tobacco: Never Used  Substance and Sexual Activity  . Alcohol use: Yes    Comment: daily beer " I am gonna  try to quit "  . Drug use: Yes    Types: Marijuana    Comment: last use 04/26/19  . Sexual activity: Not on file

## 2019-07-05 ENCOUNTER — Ambulatory Visit: Payer: Medicaid Other | Admitting: Family

## 2019-07-10 ENCOUNTER — Telehealth: Payer: Self-pay | Admitting: Orthopedic Surgery

## 2019-07-10 NOTE — Telephone Encounter (Signed)
Patient's wife Steward Drone) called advised patient need Rx for (Prednisone) [facility] in [place]  The number to contact Steward Drone is [phone]

## 2019-07-11 ENCOUNTER — Telehealth: Payer: Self-pay | Admitting: Orthopedic Surgery

## 2019-07-11 NOTE — Telephone Encounter (Signed)
Called patient's wife Steward Drone) left message to return call and schedule an appointment for patient to see provider in the office concerning his back. Issue have not been addressed since 01/2019

## 2019-07-11 NOTE — Telephone Encounter (Signed)
Can you please call pt and make an appt. Have not addressed his back since 01/2019. Thank you!

## 2019-07-14 ENCOUNTER — Encounter: Payer: Self-pay | Admitting: Family

## 2019-07-14 ENCOUNTER — Other Ambulatory Visit: Payer: Self-pay

## 2019-07-14 ENCOUNTER — Ambulatory Visit (INDEPENDENT_AMBULATORY_CARE_PROVIDER_SITE_OTHER): Payer: Medicaid Other | Admitting: Family

## 2019-07-14 VITALS — Ht 72.0 in | Wt 240.0 lb

## 2019-07-14 DIAGNOSIS — L97521 Non-pressure chronic ulcer of other part of left foot limited to breakdown of skin: Secondary | ICD-10-CM | POA: Diagnosis not present

## 2019-07-14 DIAGNOSIS — L97511 Non-pressure chronic ulcer of other part of right foot limited to breakdown of skin: Secondary | ICD-10-CM | POA: Diagnosis not present

## 2019-07-14 DIAGNOSIS — E1142 Type 2 diabetes mellitus with diabetic polyneuropathy: Secondary | ICD-10-CM | POA: Diagnosis not present

## 2019-07-14 DIAGNOSIS — Z89432 Acquired absence of left foot: Secondary | ICD-10-CM

## 2019-07-14 MED ORDER — DOXYCYCLINE HYCLATE 100 MG PO TABS: 100.0000 mg | ORAL_TABLET | Freq: Two times a day (BID) | ORAL | 0 refills | Status: DC

## 2019-07-14 MED ORDER — MUPIROCIN 2 % EX OINT: 1.0000 "application " | TOPICAL_OINTMENT | Freq: Every day | CUTANEOUS | 6 refills | Status: DC

## 2019-07-14 NOTE — Progress Notes (Signed)
Office Visit Note   Patient: [name]           Date of Birth: 02-14-1966           MRN: [id number] Visit Date: 07/14/2019              Requested by: Alvina Filbert, MD [address] [place],  [place] [address] PCP: Alvina Filbert, MD  Chief Complaint  Patient presents with  . Right Foot - Follow-up  . Left Foot - Follow-up      HPI: The patient is a 54 year old gentleman who presents today in follow-up for bilateral foot ulcerations..  He does have a transmetatarsal amputation on the left.  He has had a longstanding ulcer for about 8 months to years transmetatarsal amputation on the left today this is much larger and worse.  He is having serosanguineous drainage as well as some swelling he is concerned for infection.  We do have a recent MRI of his left foot that was not concerning for osteomyelitis.  On the right he has an ulcer beneath the third metatarsal head as well as the fifth.  These have been ongoing for quite some time as well.  These continue to worsen.   States that he is hardly able to ambulate due to pain.  He is had ongoing issues with exquisite tenderness especially over the fifth metatarsal head ulcer on the right foot.   He has been doing antibacterial ointment dressing changes full weightbearing in postop shoes bilaterally.   Of note he is in pain management with [facility].  Assessment & Plan: Visit Diagnoses:  1. Right foot ulcer, limited to breakdown of skin (HCC)   2. Status post transmetatarsal amputation of foot, left (HCC)   3. Diabetic polyneuropathy associated with type 2 diabetes mellitus (HCC)   4. Ulcer of toe of left foot, limited to breakdown of skin (HCC)     Plan: Have called in prescription for doxycycline.  Silver cell dressings applied today.  Discussed minimizing his weightbearing.    Concerns for ongoing ulceration and worsening.  Discussed with the patient concern for limb salvage surgery in the future.  Patient  will think about this.    Follow-Up Instructions: Return in about 2 weeks (around 07/28/2019).   Ortho Exam  Patient is alert, oriented, no adenopathy, well-dressed, normal affect, normal respiratory effort. On examination of the left transmetatarsal amputation the foot has some very mild edema there is no erythema no warmth he has buildup nonviable tissue surrounding the ulcer.  Ulcer is now 2 cm in diameter there is proud granulation tissue hyper granulation tissue this is proud about 5 mm.  There is some surrounding nonviable tissue extending towards his heel on the plantar aspect of his foot.  There is no purulence or abscess.  To the right foot he has no appreciable edema or erythema however he does have an ulcer beneath the second and third metatarsal heads surrounding callus and nonviable tissue ulcer is 2 cm x 15 mm with 3 mm of depth.  This was debrided with a 10 blade knife back to viable tissue central ulceration is now about 15 mm x 5 mm.  This is completely filled in with granulation tissue.  There is minimal serosanguineous drainage.  Beneath the fifth metatarsal head he has ulceration that is 15 mm in diameter with surrounding callus and nonviable tissue there is no surrounding erythema he does have surrounding tenderness there is no palpable abscess  serosanguineous drainage.  This is also fully filled in with granulation tissue.   Imaging: No results found. No images are attached to the encounter.  Labs: Lab Results  Component Value Date   HGBA1C 9.7 (H) 11/09/2018   REPTSTATUS 07/14/2017 FINAL 07/09/2017   REPTSTATUS 07/14/2017 FINAL 07/09/2017   CULT  07/09/2017    NO GROWTH 5 DAYS Performed at [facility] [address] [place], [place] [address]    CULT  07/09/2017    NO GROWTH 5 DAYS Performed at [facility], [address], [place],  [address]      Lab Results  Component Value Date   ALBUMIN 3.8 05/03/2019   ALBUMIN 3.0 (L) 07/09/2017   ALBUMIN 3.1 (L)  06/25/2017    Lab Results  Component Value Date   MG 1.4 (L) 07/11/2017   No results found for: VD25OH  No results found for: PREALBUMIN CBC EXTENDED Latest Ref Rng & Units 05/03/2019 03/23/2019 11/09/2018  WBC 4.0 - 10.5 K/uL 10.1 7.2 8.3  RBC 4.22 - 5.81 MIL/uL 4.09(L) 3.98(L) 4.29  HGB 13.0 - 17.0 g/dL 12.9(L) 12.7(L) 12.6(L)  HCT 39.0 - 52.0 % 38.1(L) 37.0(L) 38.0(L)  PLT 150 - 400 K/uL 175 208 219  NEUTROABS 1.7 - 7.7 K/uL - 6.0 -  LYMPHSABS 0.7 - 4.0 K/uL - 0.7 -     Body mass index is 32.55 kg/m.  Orders:  No orders of the defined types were placed in this encounter.  No orders of the defined types were placed in this encounter.    Procedures: No procedures performed  Clinical Data: No additional findings.  ROS:  All other systems negative, except as noted in the HPI. Review of Systems  Constitutional: Negative for chills and fever.  Skin: Positive for wound. Negative for color change.    Objective: Vital Signs: Ht 6' (1.829 m)   Wt 240 lb (108.9 kg)   BMI 32.55 kg/m   Specialty Comments:  No specialty comments available.  PMFS History: Patient Active Problem List   Diagnosis Date Noted  . Diabetes mellitus type II, non insulin dependent ([place]) 07/09/2017  . Osteomyelitis of left foot ([place]) 07/09/2017  . Hypokalemia 07/09/2017  . Hyponatremia 07/09/2017  . AKI (acute kidney injury) ([place]) 07/09/2017  . Symptomatic anemia 07/09/2017  . Essential hypertension 07/09/2017  . Depression with anxiety 07/09/2017  . Venous thrombosis 07/09/2017   Past Medical History:  Diagnosis Date  . Abscess of right foot   . ADHD   . Anxiety   . Arthritis   . Asthma   . COPD (chronic obstructive pulmonary disease) ([place])   . Diabetes mellitus   . Diabetic foot ulcer ([place]) 07/09/2017  . Diabetic ulcer of back associated with type 2 diabetes mellitus, limited to breakdown of skin ([place])   . Gout   . Hypertension   . Myocardial infarction [facility])    ? 2008,   states he did not have a cath done and never saw a cardiologist  . Neuropathy   . Pancreatitis   . Stroke [facility])    TIA  . Wears glasses     History reviewed. No pertinent family history.  Past Surgical History:  Procedure Laterality Date  . AMPUTATION Left 11/09/2018   Procedure: LEFT TRANSMETATARSAL AMPUTATION;  Surgeon: Newt Minion, MD;  Location: [facility];  Service: Orthopedics;  Laterality: Left;  . APPENDECTOMY    . FOOT SURGERY Left   . LAPAROSCOPIC APPENDECTOMY  07/28/2011   Procedure: APPENDECTOMY LAPAROSCOPIC;  Surgeon: Fabio Bering, MD;  Location: [facility];  Service: General;  Laterality: N/A;  . LEG SURGERY     Social History   Occupational History  . Not on file  Tobacco Use  . Smoking status: Never Smoker  . Smokeless tobacco: Never Used  Substance and Sexual Activity  . Alcohol use: Yes    Comment: daily beer " I am gonna try to quit "  . Drug use: Yes    Types: Marijuana    Comment: last use 04/26/19  . Sexual activity: Not on file

## 2019-07-27 ENCOUNTER — Ambulatory Visit: Payer: Medicaid Other | Admitting: Orthopedic Surgery

## 2019-07-31 ENCOUNTER — Other Ambulatory Visit: Payer: Self-pay

## 2019-07-31 ENCOUNTER — Encounter: Payer: Self-pay | Admitting: Orthopedic Surgery

## 2019-07-31 ENCOUNTER — Ambulatory Visit (INDEPENDENT_AMBULATORY_CARE_PROVIDER_SITE_OTHER): Payer: Medicaid Other | Admitting: Orthopedic Surgery

## 2019-07-31 DIAGNOSIS — T8781 Dehiscence of amputation stump: Secondary | ICD-10-CM | POA: Diagnosis not present

## 2019-07-31 DIAGNOSIS — I96 Gangrene, not elsewhere classified: Secondary | ICD-10-CM | POA: Diagnosis not present

## 2019-08-01 ENCOUNTER — Other Ambulatory Visit: Payer: Self-pay | Admitting: Physician Assistant

## 2019-08-02 ENCOUNTER — Encounter (HOSPITAL_COMMUNITY): Payer: Self-pay | Admitting: Orthopedic Surgery

## 2019-08-03 ENCOUNTER — Telehealth: Payer: Self-pay | Admitting: Orthopedic Surgery

## 2019-08-03 ENCOUNTER — Other Ambulatory Visit (HOSPITAL_COMMUNITY)
Admission: RE | Admit: 2019-08-03 | Discharge: 2019-08-03 | Disposition: A | Discharge status: Home / Self Care | Payer: Medicaid Other | Source: Ambulatory Visit | Attending: Orthopedic Surgery | Admitting: Orthopedic Surgery

## 2019-08-03 ENCOUNTER — Other Ambulatory Visit: Payer: Self-pay

## 2019-08-03 NOTE — Telephone Encounter (Signed)
[name] is scheduled for a left below knee amputation tomorrow 08/04/19.  He called in and asked me to cancel surgery.  He states that he will be seeking a 2nd opinion.  I cancelled the surgery with [facility].

## 2019-08-04 ENCOUNTER — Encounter (HOSPITAL_COMMUNITY): Admission: RE | Payer: Self-pay | Source: Home / Self Care

## 2019-08-04 ENCOUNTER — Encounter: Payer: Self-pay | Admitting: Orthopedic Surgery

## 2019-08-04 ENCOUNTER — Inpatient Hospital Stay (HOSPITAL_COMMUNITY): Admission: RE | Admit: 2019-08-04 | Payer: Medicaid Other | Source: Home / Self Care | Admitting: Orthopedic Surgery

## 2019-08-04 HISTORY — DX: Osteomyelitis, unspecified: M86.9

## 2019-08-04 SURGERY — AMPUTATION BELOW KNEE
Anesthesia: Choice | Site: Knee | Laterality: Left

## 2019-08-04 NOTE — Progress Notes (Signed)
Office Visit Note   Patient: [name]           Date of Birth: 11-08-1965           MRN: [id number] Visit Date: 07/31/2019              Requested by: Alvina Filbert, MD [address] [place],  [place] [address] PCP: Alvina Filbert, MD  Chief Complaint  Patient presents with  . Left Foot - Follow-up  . Right Foot - Follow-up      HPI: Patient is a 54 year old gentleman who was seen in follow-up for ulceration right foot beneath the first and fifth metatarsal heads as well as a large necrotic ulcer over the lateral aspect of the left midfoot.  Patient has been on doxycycline receives oxycodone from a pain clinic most recent hemoglobin A1c is 10.  Assessment & Plan: Visit Diagnoses:  1. Dehiscence of amputation stump (HCC)     Plan: Discussed with the patient his best option for ambulation is to proceed with a transtibial amputation on the left.  We will plan for surgery on Friday.  Anticipate wound care would be appropriate for the [facility] grade 1 ulcers on the right foot.  Follow-Up Instructions: Return in about 2 weeks (around 08/14/2019).   Ortho Exam  Patient is alert, oriented, no adenopathy, well-dressed, normal affect, normal respiratory effort. Examination of both feet patient does have a palpable dorsalis pedis pulse.  Patient has a Wagner grade 1 ulcer beneath the right first and fifth metatarsal heads there is no exposed bone or tendon no cellulitis no drainage no signs of infection.  Examination of the left foot patient has supination of approximately 45 degrees with weightbearing on the lateral aspect of his foot there is a large necrotic ulcer which is exposed bone swelling hyper granulation tissue.  Imaging: No results found. No images are attached to the encounter.  Labs: Lab Results  Component Value Date   HGBA1C 9.7 (H) 11/09/2018   REPTSTATUS 07/14/2017 FINAL 07/09/2017   REPTSTATUS 07/14/2017 FINAL 07/09/2017   CULT  07/09/2017    NO GROWTH 5  DAYS Performed at [facility], [address] [place], [place] [address]    CULT  07/09/2017    NO GROWTH 5 DAYS Performed at [facility], [address] [place], [place] [address]      Lab Results  Component Value Date   ALBUMIN 3.8 05/03/2019   ALBUMIN 3.0 (L) 07/09/2017   ALBUMIN 3.1 (L) 06/25/2017    Lab Results  Component Value Date   MG 1.4 (L) 07/11/2017   No results found for: VD25OH  No results found for: PREALBUMIN CBC EXTENDED Latest Ref Rng & Units 05/03/2019 03/23/2019 11/09/2018  WBC 4.0 - 10.5 K/uL 10.1 7.2 8.3  RBC 4.22 - 5.81 MIL/uL 4.09(L) 3.98(L) 4.29  HGB 13.0 - 17.0 g/dL 12.9(L) 12.7(L) 12.6(L)  HCT 39.0 - 52.0 % 38.1(L) 37.0(L) 38.0(L)  PLT 150 - 400 K/uL 175 208 219  NEUTROABS 1.7 - 7.7 K/uL - 6.0 -  LYMPHSABS 0.7 - 4.0 K/uL - 0.7 -     There is no height or weight on file to calculate BMI.  Orders:  No orders of the defined types were placed in this encounter.  No orders of the defined types were placed in this encounter.    Procedures: No procedures performed  Clinical Data: No additional findings.  ROS:  All other systems negative, except as noted in the HPI. Review  of Systems  Objective: Vital Signs: There were no vitals taken for this visit.  Specialty Comments:  No specialty comments available.  PMFS History: Patient Active Problem List   Diagnosis Date Noted  . Diabetes mellitus type II, non insulin dependent ([place]) 07/09/2017  . Osteomyelitis of left foot ([place]) 07/09/2017  . Hypokalemia 07/09/2017  . Hyponatremia 07/09/2017  . AKI (acute kidney injury) ([place]) 07/09/2017  . Symptomatic anemia 07/09/2017  . Essential hypertension 07/09/2017  . Depression with anxiety 07/09/2017  . Venous thrombosis 07/09/2017   Past Medical History:  Diagnosis Date  . Abscess of right foot   . ADHD   . Anxiety   . Arthritis   . Asthma   . COPD (chronic obstructive pulmonary disease) ([place])   . Diabetes mellitus   . Diabetic  foot ulcer ([place]) 07/09/2017  . Diabetic ulcer of back associated with type 2 diabetes mellitus, limited to breakdown of skin ([place])   . Foot osteomyelitis, left ([place])   . Gout   . Hypertension   . Myocardial infarction [facility])    ? 2008,  states he did not have a cath done and never saw a cardiologist  . Neuropathy   . Pancreatitis   . Stroke [facility])    TIA  . Wears glasses     History reviewed. No pertinent family history.  Past Surgical History:  Procedure Laterality Date  . AMPUTATION Left 11/09/2018   Procedure: LEFT TRANSMETATARSAL AMPUTATION;  Surgeon: Newt Minion, MD;  Location: [facility];  Service: Orthopedics;  Laterality: Left;  . APPENDECTOMY    . FOOT SURGERY Left   . LAPAROSCOPIC APPENDECTOMY  07/28/2011   Procedure: APPENDECTOMY LAPAROSCOPIC;  Surgeon: Donato Heinz, MD;  Location: [facility];  Service: General;  Laterality: N/A;  . LEG SURGERY     Social History   Occupational History  . Not on file  Tobacco Use  . Smoking status: Never Smoker  . Smokeless tobacco: Never Used  Substance and Sexual Activity  . Alcohol use: Yes    Comment: daily beer " I am gonna try to quit "  . Drug use: Yes    Types: Marijuana    Comment: last use 04/26/19  . Sexual activity: Not on file

## 2019-08-17 ENCOUNTER — Telehealth: Payer: Self-pay | Admitting: Orthopedic Surgery

## 2019-08-17 ENCOUNTER — Encounter: Payer: Self-pay | Admitting: Orthopedic Surgery

## 2019-08-17 ENCOUNTER — Other Ambulatory Visit: Payer: Self-pay

## 2019-08-17 ENCOUNTER — Ambulatory Visit (INDEPENDENT_AMBULATORY_CARE_PROVIDER_SITE_OTHER): Payer: Medicaid Other | Admitting: Orthopedic Surgery

## 2019-08-17 VITALS — Ht 72.0 in | Wt 240.0 lb

## 2019-08-17 DIAGNOSIS — L97514 Non-pressure chronic ulcer of other part of right foot with necrosis of bone: Secondary | ICD-10-CM

## 2019-08-17 DIAGNOSIS — I96 Gangrene, not elsewhere classified: Secondary | ICD-10-CM

## 2019-08-17 DIAGNOSIS — T8781 Dehiscence of amputation stump: Secondary | ICD-10-CM

## 2019-08-17 DIAGNOSIS — Z89432 Acquired absence of left foot: Secondary | ICD-10-CM

## 2019-08-17 DIAGNOSIS — E1142 Type 2 diabetes mellitus with diabetic polyneuropathy: Secondary | ICD-10-CM

## 2019-08-17 NOTE — Progress Notes (Signed)
Office Visit Note   Patient: [name]           Date of Birth: 08-Feb-1966           MRN: [id number] Visit Date: 08/17/2019              Requested by: Alvina Filbert, MD [address] [place],  [place] [address] PCP: Alvina Filbert, MD  Chief Complaint  Patient presents with  . Left Leg - Follow-up    cx BKA 08/04/19 to seek second opinion       HPI: Patient is a 54 year old gentleman is seen in follow-up for osteomyelitis abscess ulceration of the left foot transmetatarsal amputation with worsening ulceration on the right foot primarily over the fifth metatarsal head and second metatarsal head.  Patient was initially seen on 07/31/2019 and with the advanced infection of the left foot patient was scheduled for surgery on 08/04/2019.  Patient canceled his surgery to get a second opinion.  Patient follows up today he has not obtained a second opinion states that he would still like to get a second opinion.  He is currently full weightbearing on both lower extremities with large painful ulcers on both feet.  Patient's MRI scan of the right foot in February 2021 did show infection around the fifth metatarsal head.  Patient's hemoglobin A1c was 9.71-year ago.  Patient is concerned why his care has been delayed.  Assessment & Plan: Visit Diagnoses: No diagnosis found.  Plan: I discussed with patient that he could follow-up with Dr. Romeo Apple in [place] for second opinion discussed that we should not be delaying his surgery any longer and that he is at risk of life or limb if this is not treated in a timely fashion.  Have recommended proceeding with a transtibial amputation on the left and he will need 1/5 ray amputation on the right at a minimum.  Discussed that we would proceed with a left foot at this time.  Patient questions were encouraged and answered he brought a handwritten sheet of questions handwritten answers were written on his paperwork to address all the issues and  concerns.  I offered calling his wife on the phone during the appointment to answer further questions and patient states he has no way of contacting his wife at this time.  Patient states that this time he would need time to set up transportation for surgery and request surgery next Friday.  He states he cannot proceed with surgery any sooner.  Again discussed the importance of not delaying surgery any further if there is any changes in the signs or symptoms with the potential for sepsis recommended he go to [facility] emergency room urgently and will proceed with surgery urgently.  Follow-Up Instructions: No follow-ups on file.   Ortho Exam  Patient is alert, oriented, no adenopathy, well-dressed, normal affect, normal respiratory effort. Examination patient has a palpable dorsalis pedis pulse bilaterally.  There is a large necrotic ulcer over the residual limb of the left transmetatarsal amputation with significant swelling he does have good hair growth down to the foot.  The ulcer probes all the way down to the transmetatarsal amputation.  Discussed that there are not any foot salvage interventions available at this time and a transtibial amputation is his only option.  Semination the right foot he has an ulcer over the fifth metatarsal head which probes down to bone at this time with worsening infection of the right foot he also has a ulcer on  the plantar aspect of the second metatarsal head which is superficial and does not probe to bone.  Imaging: No results found. No images are attached to the encounter.  Labs: Lab Results  Component Value Date   HGBA1C 9.7 (H) 11/09/2018   REPTSTATUS 07/14/2017 FINAL 07/09/2017   REPTSTATUS 07/14/2017 FINAL 07/09/2017   CULT  07/09/2017    NO GROWTH 5 DAYS Performed at [facility], [address] [place], [place] [address]    CULT  07/09/2017    NO GROWTH 5 DAYS Performed at [facility], [address] [place], [place] [address]      Lab Results    Component Value Date   ALBUMIN 3.8 05/03/2019   ALBUMIN 3.0 (L) 07/09/2017   ALBUMIN 3.1 (L) 06/25/2017    Lab Results  Component Value Date   MG 1.4 (L) 07/11/2017   No results found for: VD25OH  No results found for: PREALBUMIN CBC EXTENDED Latest Ref Rng & Units 05/03/2019 03/23/2019 11/09/2018  WBC 4.0 - 10.5 K/uL 10.1 7.2 8.3  RBC 4.22 - 5.81 MIL/uL 4.09(L) 3.98(L) 4.29  HGB 13.0 - 17.0 g/dL 12.9(L) 12.7(L) 12.6(L)  HCT 39.0 - 52.0 % 38.1(L) 37.0(L) 38.0(L)  PLT 150 - 400 K/uL 175 208 219  NEUTROABS 1.7 - 7.7 K/uL - 6.0 -  LYMPHSABS 0.7 - 4.0 K/uL - 0.7 -     Body mass index is 32.55 kg/m.  Orders:  No orders of the defined types were placed in this encounter.  No orders of the defined types were placed in this encounter.    Procedures: No procedures performed  Clinical Data: No additional findings.  ROS:  All other systems negative, except as noted in the HPI. Review of Systems  Objective: Vital Signs: Ht 6' (1.829 m)   Wt 240 lb (108.9 kg)   BMI 32.55 kg/m   Specialty Comments:  No specialty comments available.  PMFS History: Patient Active Problem List   Diagnosis Date Noted  . Diabetes mellitus type II, non insulin dependent ([place]) 07/09/2017  . Osteomyelitis of left foot ([place]) 07/09/2017  . Hypokalemia 07/09/2017  . Hyponatremia 07/09/2017  . AKI (acute kidney injury) ([place]) 07/09/2017  . Symptomatic anemia 07/09/2017  . Essential hypertension 07/09/2017  . Depression with anxiety 07/09/2017  . Venous thrombosis 07/09/2017   Past Medical History:  Diagnosis Date  . Abscess of right foot   . ADHD   . Anxiety   . Arthritis   . Asthma   . COPD (chronic obstructive pulmonary disease) ([place])   . Diabetes mellitus   . Diabetic foot ulcer ([place]) 07/09/2017  . Diabetic ulcer of back associated with type 2 diabetes mellitus, limited to breakdown of skin ([place])   . Foot osteomyelitis, left ([place])   . Gout   . Hypertension   . Myocardial  infarction [facility])    ? 2008,  states he did not have a cath done and never saw a cardiologist  . Neuropathy   . Pancreatitis   . Stroke [facility])    TIA  . Wears glasses     No family history on file.  Past Surgical History:  Procedure Laterality Date  . AMPUTATION Left 11/09/2018   Procedure: LEFT TRANSMETATARSAL AMPUTATION;  Surgeon: Newt Minion, MD;  Location: [place];  Service: Orthopedics;  Laterality: Left;  . APPENDECTOMY    . FOOT SURGERY Left   . LAPAROSCOPIC APPENDECTOMY  07/28/2011   Procedure: APPENDECTOMY LAPAROSCOPIC;  Surgeon: Donato Heinz, MD;  [facility];  Service: General;  Laterality: N/A;  . LEG SURGERY     Social History   Occupational History  . Not on file  Tobacco Use  . Smoking status: Never Smoker  . Smokeless tobacco: Never Used  Substance and Sexual Activity  . Alcohol use: Yes    Comment: daily beer " I am gonna try to quit "  . Drug use: Yes    Types: Marijuana    Comment: last use 04/26/19  . Sexual activity: Not on file

## 2019-08-17 NOTE — Telephone Encounter (Signed)
Pts wife Steward Drone called stating the pt told her Dr. Lajoyce Corners had some things to discuss with her and so she would like a call back to discuss the matter.   [phone]

## 2019-08-17 NOTE — Telephone Encounter (Signed)
Pt had office visit today needs BKA wanted second opinion please call wife see message below.

## 2019-08-24 ENCOUNTER — Telehealth: Payer: Self-pay | Admitting: Orthopedic Surgery

## 2019-08-24 NOTE — Telephone Encounter (Signed)
Dr. Lajoyce Corners saw [name] on 08/17/19 and advised him that he needed to have a left below knee amputation on 08/25/19.  I called Mr. Tourigny and left a message for him or his wife to please return my call.

## 2019-08-25 ENCOUNTER — Telehealth: Payer: Self-pay | Admitting: Orthopedic Surgery

## 2019-08-25 NOTE — Telephone Encounter (Signed)
[name] returned my call regarding [name]'s surgery.  She advised me that she did not think he wanted to have it done, but she knows he needs to.  We have discussed surgery and scheduled multiple times and each time he cancels, states he will get a 2nd opinion and does not obtain the 2nd opinion.  [name] states that he has been drinking heavily, feeling sick, ulcers on the bottom of his foot are worse and his little toe on the opposite foot is "in bad shape".  She states he was running a fever last night.  I advised her that she needed to take him to the emergency room right away to have his feet looked at.  She informed me that she has been trying to get him to go the [facility] ED for several days and he "will not listen".  I asked to speak with [name].  He got on the phone with me and sounded confused, stated that he had been drinking heavily every day and not feeling well.  I expressed the importance of him seeking care urgently at the hospital.  He is aware that his infection could be spreading and that this could be a life threatening event.  His speech was very slurred and he seemed to be possibly falling asleep or dropping the phone.  He asked that I continue speaking with [name].  [name] asked if "Dr. Lajoyce Corners could just call in some antibiotics and pain medicine for him" , or "order the hyperbarics", "he does not want to have the surgery".  She is worried that he will die in the house because he will not "listen".  I again stated that he needed to go to the hospital, even if she needed to call 911.  She expressed understanding and will try to transport him to the emergency room. She thinks she can get to [facility].  I advised that was fine, they could potentially transfer him to [facility] where Dr. Lajoyce Corners could be consulted and perform emergency surgery if needed.  I called Dr. Lajoyce Corners and made him aware of the situation.

## 2019-08-28 ENCOUNTER — Other Ambulatory Visit: Payer: Self-pay | Admitting: Physician Assistant

## 2019-08-29 ENCOUNTER — Telehealth: Payer: Self-pay | Admitting: Orthopedic Surgery

## 2019-08-29 ENCOUNTER — Encounter (HOSPITAL_COMMUNITY): Payer: Self-pay | Admitting: Orthopedic Surgery

## 2019-08-29 ENCOUNTER — Other Ambulatory Visit: Payer: Self-pay

## 2019-08-29 NOTE — Telephone Encounter (Signed)
Patient and wife has questions and concerns about surgery tomorrow. Asking for Dr. Lajoyce Corners to call as soon as possible.Patient phone number is [phone]

## 2019-08-29 NOTE — Progress Notes (Signed)
   08/29/19 1513  OBSTRUCTIVE SLEEP APNEA  Have you ever been diagnosed with sleep apnea through a sleep study? No  Do you snore loudly (loud enough to be heard through closed doors)?  1  Do you often feel tired, fatigued, or sleepy during the daytime (such as falling asleep during driving or talking to someone)? 0  Has anyone observed you stop breathing during your sleep? 1  Do you have, or are you being treated for high blood pressure? 1  BMI more than 35 kg/m2? 0  Age > 50 (1-yes) 1  Male Gender (Yes=1) 1  Obstructive Sleep Apnea Score 5

## 2019-08-29 NOTE — Progress Notes (Signed)
After several attempts to call pt, pt's wife, [name] returned my call. She (with the patient in the background yelling the answers to the questions) did the pre-op call. No documented heart disease, but pt has said in the past he had an heart attack in 2008, but never saw a cardiologist and did not have a heart cath. Pt is a type 2 diabetic. Last A1C was 10.1 on 01/12/19.  [name] states pt's blood sugar (fasting) had been running in the 300's, she states the battery died on the meter and he hasn't checked in a week. Instructed [name] to take 1/2 of his regular dose of Humulin N insulin this evening. If [name] is able to get a battery for the meter, pt is to check his blood sugar in the AM when he wakes up and every 2 hours until he leaves for the hospital. If blood sugar is 70 or below, treat with 1/2 cup of clear juice (apple or cranberry) and recheck blood sugar 15 minutes after drinking juice. She voiced understanding.  Pt was unable to come for a Covid test. He will arrive early to get his test done prior to surgery.  ERAS protocol orderd - Instructed Brenda to have pt not eat food after midnight tonight. He may have clear liquids until 8:50 AM. I reviewed the list of clear liquids with [name]. I asked her to measure out 10 ounces of water in the AM and bring it with him to drink 10 minutes before he gets to the hospital (that will be 3 hours prior to surgery and that will be the last thing he drinks). She voiced understanding.

## 2019-08-29 NOTE — Telephone Encounter (Signed)
I called and sw the pt and he wanted to know where his incision would be. I advised that he is sch for a BKA and that this will be mid shin between the ankle and the knee. Pt voiced understanding

## 2019-08-30 ENCOUNTER — Encounter (HOSPITAL_COMMUNITY): Payer: Self-pay | Admitting: Anesthesiology

## 2019-08-30 ENCOUNTER — Inpatient Hospital Stay (HOSPITAL_COMMUNITY): Admission: RE | Admit: 2019-08-30 | Payer: Medicaid Other | Source: Home / Self Care | Admitting: Orthopedic Surgery

## 2019-08-30 HISTORY — DX: Anemia, unspecified: D64.9

## 2019-08-30 SURGERY — AMPUTATION BELOW KNEE
Anesthesia: Choice | Site: Knee | Laterality: Left

## 2019-08-30 NOTE — H&P (Signed)
[name] is an 54 y.o. male.   Chief Complaint: Left Foot osteomyelitis HPI: Patient is a 54 year old gentleman is seen in follow-up for osteomyelitis abscess ulceration of the left foot transmetatarsal amputation with worsening ulceration on the right foot primarily over the fifth metatarsal head and second metatarsal head.  Patient was initially seen on 07/31/2019 and with the advanced infection of the left foot patient was scheduled for surgery on 08/04/2019.  Patient canceled his surgery to get a second opinion.  Patient follows up today he has not obtained a second opinion states that he would still like to get a second opinion.  He is currently full weightbearing on both lower extremities with large painful ulcers on both feet.  Patient's MRI scan of the right foot in February 2021 did show infection around the fifth metatarsal head.  Patient's hemoglobin A1c was 9.71-year ago.  Patient is concerned why his care has been delayed.  Past Medical History:  Diagnosis Date  . Abscess of right foot   . ADHD   . Anemia   . Anxiety   . Arthritis   . Asthma   . COPD (chronic obstructive pulmonary disease) ([place])   . Diabetes mellitus   . Diabetic foot ulcer ([place]) 07/09/2017  . Diabetic ulcer of back associated with type 2 diabetes mellitus, limited to breakdown of skin ([place])   . Foot osteomyelitis, left ([place])   . Gout   . Hypertension   . Myocardial infarction [facility])    ? 2008,  states he did not have a cath done and never saw a cardiologist  . Neuropathy   . Pancreatitis   . Stroke [facility])    TIA  . Wears glasses     Past Surgical History:  Procedure Laterality Date  . AMPUTATION Left 11/09/2018   Procedure: LEFT TRANSMETATARSAL AMPUTATION;  Surgeon: Newt Minion, MD;  Location: [place];  Service: Orthopedics;  Laterality: Left;  . APPENDECTOMY    . FOOT SURGERY Left   . LAPAROSCOPIC APPENDECTOMY  07/28/2011   Procedure: APPENDECTOMY LAPAROSCOPIC;  Surgeon: Donato Heinz, MD;   Location: [facility];  Service: General;  Laterality: N/A;  . LEG SURGERY      History reviewed. No pertinent family history. Social History:  reports that he has quit smoking. He has never used smokeless tobacco. He reports current alcohol use. He reports current drug use. Drug: Marijuana.  Allergies:  Allergies  Allergen Reactions  . Aspirin Other (See Comments)    Patient says he's a free bleeder.   . Penicillins Hives and Other (See Comments)    Has patient had a PCN reaction causing immediate rash, facial/tongue/throat swelling, SOB or lightheadedness with hypotension: YES Has patient had a PCN reaction causing severe rash involving mucus membranes or skin necrosis: NO Has patient had a PCN reaction that required hospitalization: NO Has patient had a PCN reaction occurring within the last 10 years: NO If all of the above answers are "NO", then may proceed with Cephalosporin use.    No medications prior to admission.    No results found for this or any previous visit (from the past 48 hour(s)). No results found.  Review of Systems  All other systems reviewed and are negative.   There were no vitals taken for this visit. Physical Exam  Patient is alert, oriented, no adenopathy, well-dressed, normal affect, normal respiratory effort. Examination patient has a palpable dorsalis pedis pulse bilaterally.  There is a large necrotic ulcer over the  residual limb of the left transmetatarsal amputation with significant swelling he does have good hair growth down to the foot.  The ulcer probes all the way down to the transmetatarsal amputation.  Discussed that there are not any foot salvage interventions available at this time and a transtibial amputation is his only option.  Semination the right foot he has an ulcer over the fifth metatarsal head which probes down to bone at this time with worsening infection of the right foot he also has a ulcer on the plantar aspect of the second metatarsal  head which is superficial and does not probe to bone. Lungs Clear. Heart RRR Assessment/Plan Plan: I discussed with patient that he could follow-up with Dr. Romeo Apple in [place] for second opinion discussed that we should not be delaying his surgery any longer and that he is at risk of life or limb if this is not treated in a timely fashion.  Have recommended proceeding with a transtibial amputation on the left and he will need 1/5 ray amputation on the right at a minimum.  Discussed that we would proceed with a left foot at this time.  Patient questions were encouraged and answered he brought a handwritten sheet of questions handwritten answers were written on his paperwork to address all the issues and concerns.  I offered calling his wife on the phone during the appointment to answer further questions and patient states he has no way of contacting his wife at this time.  Patient states that this time he would need time to set up transportation for surgery and request surgery next Friday.  He states he cannot proceed with surgery any sooner.  Again discussed the importance of not delaying surgery any further if there is any changes in the signs or symptoms with the potential for sepsis recommended he go to [facility] emergency room urgently and will proceed with surgery urgently.  [place] Jackie Littlejohn, PA 08/30/2019, 6:34 AM

## 2019-08-30 NOTE — Anesthesia Preprocedure Evaluation (Deleted)
Anesthesia Evaluation    Reviewed: Allergy & Precautions, Patient's Chart, lab work & pertinent test results  Airway        Dental  (+)    Pulmonary asthma , COPD, former smoker,           Cardiovascular hypertension, Pt. on medications + Past MI       Neuro/Psych PSYCHIATRIC DISORDERS Anxiety Depression  Neuromuscular disease (neuropathy) CVA    GI/Hepatic (+)     substance abuse  marijuana use,   Endo/Other  diabetes, Type 2, Insulin Dependent, Oral Hypoglycemic Agents Obesity   Renal/GU      Musculoskeletal  (+) Arthritis ,  Gout    Abdominal   Peds  Hematology   Anesthesia Other Findings   Reproductive/Obstetrics                             Anesthesia Physical  Anesthesia Plan  ASA: III  Anesthesia Plan: General   Post-op Pain Management:  Regional for Post-op pain   Induction: Intravenous  PONV Risk Score and Plan: 3 and Ondansetron, Midazolam and Treatment may vary due to age or medical condition  Airway Management Planned: LMA  Additional Equipment: None  Intra-op Plan:   Post-operative Plan: Extubation in OR  Informed Consent:   Plan Discussed with: CRNA and Anesthesiologist  Anesthesia Plan Comments:         Anesthesia Quick Evaluation

## 2019-08-31 ENCOUNTER — Encounter (HOSPITAL_COMMUNITY): Payer: Self-pay | Admitting: Emergency Medicine

## 2019-08-31 ENCOUNTER — Emergency Department (HOSPITAL_COMMUNITY)
Admission: EM | Admit: 2019-08-31 | Discharge: 2019-08-31 | Disposition: A | Discharge status: Home / Self Care | Payer: Medicaid Other | Attending: Emergency Medicine | Admitting: Emergency Medicine

## 2019-08-31 ENCOUNTER — Other Ambulatory Visit: Payer: Self-pay

## 2019-08-31 DIAGNOSIS — M79605 Pain in left leg: Secondary | ICD-10-CM | POA: Diagnosis present

## 2019-08-31 DIAGNOSIS — Z5321 Procedure and treatment not carried out due to patient leaving prior to being seen by health care provider: Secondary | ICD-10-CM | POA: Insufficient documentation

## 2019-08-31 LAB — CBC WITH DIFFERENTIAL/PLATELET
Abs Immature Granulocytes: 0.03 10*3/uL (ref 0.00–0.07)
Basophils Absolute: 0 10*3/uL (ref 0.0–0.1)
Basophils Relative: 0 %
Eosinophils Absolute: 0.1 10*3/uL (ref 0.0–0.5)
Eosinophils Relative: 2 %
HCT: 33.8 % — ABNORMAL LOW (ref 39.0–52.0)
Hemoglobin: 10.6 g/dL — ABNORMAL LOW (ref 13.0–17.0)
Immature Granulocytes: 0 %
Lymphocytes Relative: 18 %
Lymphs Abs: 1.4 10*3/uL (ref 0.7–4.0)
MCH: 28.6 pg (ref 26.0–34.0)
MCHC: 31.4 g/dL (ref 30.0–36.0)
MCV: 91.1 fL (ref 80.0–100.0)
Monocytes Absolute: 0.4 10*3/uL (ref 0.1–1.0)
Monocytes Relative: 5 %
Neutro Abs: 5.9 10*3/uL (ref 1.7–7.7)
Neutrophils Relative %: 75 %
Platelets: 312 10*3/uL (ref 150–400)
RBC: 3.71 MIL/uL — ABNORMAL LOW (ref 4.22–5.81)
RDW: 12.4 % (ref 11.5–15.5)
WBC: 8 10*3/uL (ref 4.0–10.5)
nRBC: 0 % (ref 0.0–0.2)

## 2019-08-31 LAB — COMPREHENSIVE METABOLIC PANEL
ALT: 17 U/L (ref 0–44)
AST: 38 U/L (ref 15–41)
Albumin: 2.5 g/dL — ABNORMAL LOW (ref 3.5–5.0)
Alkaline Phosphatase: 103 U/L (ref 38–126)
Anion gap: 8 (ref 5–15)
BUN: <5 mg/dL — ABNORMAL LOW (ref 6–20)
CO2: 26 mmol/L (ref 22–32)
Calcium: 8.5 mg/dL — ABNORMAL LOW (ref 8.9–10.3)
Chloride: 95 mmol/L — ABNORMAL LOW (ref 98–111)
Creatinine, Ser: 0.56 mg/dL — ABNORMAL LOW (ref 0.61–1.24)
GFR calc Af Amer: >60 mL/min (ref >60)
GFR calc non Af Amer: >60 mL/min (ref >60)
Glucose, Bld: 285 mg/dL — ABNORMAL HIGH (ref 70–99)
Potassium: 4 mmol/L (ref 3.5–5.1)
Sodium: 129 mmol/L — ABNORMAL LOW (ref 135–145)
Total Bilirubin: 0.3 mg/dL (ref 0.3–1.2)
Total Protein: 7.7 g/dL (ref 6.5–8.1)

## 2019-08-31 NOTE — ED Notes (Signed)
No answer x2 for a room 

## 2019-08-31 NOTE — ED Triage Notes (Addendum)
Pt states dr Doyce Loose sent him here for a "check up" states that he is supposed to have his Lower left leg amputated and they wanted him to come in for evaluation prior to the surgery. He reports Dr. Mia Creek nurse called him this morning and told him to come here asap, however, he is unaware of any abnormal tests or labs that they were concerned about.  Does endorse leg pain.

## 2019-09-05 ENCOUNTER — Other Ambulatory Visit: Payer: Self-pay | Admitting: Physician Assistant

## 2019-09-05 ENCOUNTER — Other Ambulatory Visit: Payer: Self-pay

## 2019-09-05 ENCOUNTER — Encounter (HOSPITAL_COMMUNITY): Payer: Self-pay | Admitting: Orthopedic Surgery

## 2019-09-06 ENCOUNTER — Ambulatory Visit (HOSPITAL_COMMUNITY)
Admission: RE | Admit: 2019-09-06 | Discharge: 2019-09-06 | Disposition: A | Discharge status: Home / Self Care | Payer: Medicaid Other | Attending: Orthopedic Surgery | Admitting: Orthopedic Surgery

## 2019-09-06 ENCOUNTER — Encounter (HOSPITAL_COMMUNITY)
Admission: RE | Disposition: A | Discharge status: Home / Self Care | Payer: Self-pay | Source: Home / Self Care | Attending: Orthopedic Surgery

## 2019-09-06 DIAGNOSIS — M1990 Unspecified osteoarthritis, unspecified site: Secondary | ICD-10-CM | POA: Insufficient documentation

## 2019-09-06 DIAGNOSIS — Z87891 Personal history of nicotine dependence: Secondary | ICD-10-CM | POA: Diagnosis not present

## 2019-09-06 DIAGNOSIS — L97519 Non-pressure chronic ulcer of other part of right foot with unspecified severity: Secondary | ICD-10-CM | POA: Diagnosis not present

## 2019-09-06 DIAGNOSIS — J449 Chronic obstructive pulmonary disease, unspecified: Secondary | ICD-10-CM | POA: Diagnosis not present

## 2019-09-06 DIAGNOSIS — M868X7 Other osteomyelitis, ankle and foot: Secondary | ICD-10-CM | POA: Insufficient documentation

## 2019-09-06 DIAGNOSIS — I252 Old myocardial infarction: Secondary | ICD-10-CM | POA: Insufficient documentation

## 2019-09-06 DIAGNOSIS — L97529 Non-pressure chronic ulcer of other part of left foot with unspecified severity: Secondary | ICD-10-CM | POA: Diagnosis not present

## 2019-09-06 DIAGNOSIS — M79604 Pain in right leg: Secondary | ICD-10-CM | POA: Insufficient documentation

## 2019-09-06 DIAGNOSIS — M79605 Pain in left leg: Secondary | ICD-10-CM | POA: Insufficient documentation

## 2019-09-06 DIAGNOSIS — Z8673 Personal history of transient ischemic attack (TIA), and cerebral infarction without residual deficits: Secondary | ICD-10-CM | POA: Diagnosis not present

## 2019-09-06 DIAGNOSIS — I10 Essential (primary) hypertension: Secondary | ICD-10-CM | POA: Insufficient documentation

## 2019-09-06 DIAGNOSIS — E11621 Type 2 diabetes mellitus with foot ulcer: Secondary | ICD-10-CM | POA: Diagnosis not present

## 2019-09-06 LAB — GLUCOSE, CAPILLARY: Glucose-Capillary: 359 mg/dL — ABNORMAL HIGH (ref 70–99)

## 2019-09-06 SURGERY — AMPUTATION BELOW KNEE
Anesthesia: Choice | Site: Knee | Laterality: Left

## 2019-09-06 NOTE — H&P (Signed)
[name] is an 54 y.o. male.   Chief Complaint: Left foot Osteomyelitis HPI: Patient is a 54 year old gentleman is seen in follow-up for osteomyelitis abscess ulceration of the left foot transmetatarsal amputation with worsening ulceration on the right foot primarily over the fifth metatarsal head and second metatarsal head.  Patient was initially seen on 07/31/2019 and with the advanced infection of the left foot patient was scheduled for surgery on 08/04/2019.  Patient canceled his surgery to get a second opinion.  Patient follows up today he has not obtained a second opinion states that he would still like to get a second opinion.  He is currently full weightbearing on both lower extremities with large painful ulcers on both feet.  Past Medical History:  Diagnosis Date  . Abscess of right foot   . ADHD   . Anemia   . Anxiety   . Arthritis   . Asthma   . COPD (chronic obstructive pulmonary disease) ([place])   . Diabetes mellitus   . Diabetic foot ulcer ([place]) 07/09/2017  . Diabetic ulcer of back associated with type 2 diabetes mellitus, limited to breakdown of skin ([place])   . Foot osteomyelitis, left ([place])   . Gout   . Hypertension   . Myocardial infarction [facility])    ? 2008,  states he did not have a cath done and never saw a cardiologist  . Neuropathy   . Pancreatitis   . Stroke [facility])    TIA  . Wears glasses     Past Surgical History:  Procedure Laterality Date  . AMPUTATION Left 11/09/2018   Procedure: LEFT TRANSMETATARSAL AMPUTATION;  Surgeon: Newt Minion, MD;  Location: [place];  Service: Orthopedics;  Laterality: Left;  . APPENDECTOMY    . FOOT SURGERY Left   . LAPAROSCOPIC APPENDECTOMY  07/28/2011   Procedure: APPENDECTOMY LAPAROSCOPIC;  Surgeon: Donato Heinz, MD;  Location: [facility];  Service: General;  Laterality: N/A;  . LEG SURGERY      History reviewed. No pertinent family history. Social History:  reports that he has quit smoking. His smoking use included  cigarettes. He has never used smokeless tobacco. He reports current alcohol use. He reports current drug use. Drug: Marijuana.  Allergies:  Allergies  Allergen Reactions  . Aspirin Other (See Comments)    Patient says he's a free bleeder.   . Penicillins Hives and Other (See Comments)    Has patient had a PCN reaction causing immediate rash, facial/tongue/throat swelling, SOB or lightheadedness with hypotension: YES Has patient had a PCN reaction causing severe rash involving mucus membranes or skin necrosis: NO Has patient had a PCN reaction that required hospitalization: NO Has patient had a PCN reaction occurring within the last 10 years: NO If all of the above answers are "NO", then may proceed with Cephalosporin use.    No medications prior to admission.    No results found for this or any previous visit (from the past 48 hour(s)). No results found.  Review of Systems  All other systems reviewed and are negative.   There were no vitals taken for this visit. Physical Exam  Patient is alert, oriented, no adenopathy, well-dressed, normal affect, normal respiratory effort. Examination patient has a palpable dorsalis pedis pulse bilaterally.  There is a large necrotic ulcer over the residual limb of the left transmetatarsal amputation with significant swelling he does have good hair growth down to the foot.  The ulcer probes all the way down to the transmetatarsal  amputation.  Discussed that there are not any foot salvage interventions available at this time and a transtibial amputation is his only option.  Semination the right foot he has an ulcer over the fifth metatarsal head which probes down to bone at this time with worsening infection of the right foot he also has a ulcer on the plantar aspect of the second metatarsal Heart RRR Lungs Clr Assessment/Plan   I discussed with patient that he could follow-up with Dr. Romeo Apple in [place] for second opinion discussed that we should  not be delaying his surgery any longer and that he is at risk of life or limb if this is not treated in a timely fashion.  Have recommended proceeding with a transtibial amputation on the left and he will need 1/5 ray amputation on the right at a minimum.  Discussed that we would proceed with a left foot at this time.  Patient questions were encouraged and answered he brought a handwritten sheet of questions handwritten answers were written on his paperwork to address all the issues and concerns.  I offered calling his wife on the phone during the appointment to answer further questions and patient states he has no way of contacting his wife at this time.  [place] Harlin Mazzoni, PA 09/06/2019, 5:53 AM

## 2019-09-06 NOTE — Progress Notes (Signed)
Pt's wife called and said pt's blood sugar was over 500. Spoke with Dr. Noreene Larsson and he said for the patient to come in and get them started on the Endotool. Told wife this and she verbalized understanding.

## 2019-09-06 NOTE — Progress Notes (Signed)
Pt arrived hyperglycemic, refused prep for surgery, stated "I need to tell my cousin that they are going ahead with surgery" and left the unit.  This RN followed him to the hospital entrance, where he said he did not want surgery and was leaving. Patient signed AMA paperwork before leaving.  OR and surgeon made aware.

## 2019-09-07 ENCOUNTER — Other Ambulatory Visit: Payer: Self-pay | Admitting: Physician Assistant

## 2019-09-08 ENCOUNTER — Encounter (HOSPITAL_COMMUNITY): Admission: RE | Payer: Self-pay | Source: Home / Self Care

## 2019-09-08 ENCOUNTER — Inpatient Hospital Stay (HOSPITAL_COMMUNITY): Admission: RE | Admit: 2019-09-08 | Payer: Medicaid Other | Source: Home / Self Care | Admitting: Orthopedic Surgery

## 2019-09-08 SURGERY — AMPUTATION BELOW KNEE
Anesthesia: Choice | Site: Knee | Laterality: Left

## 2019-09-08 NOTE — H&P (Signed)
[name] is an 54 y.o. male.   Chief Complaint: Left Foot Osteomyelitis HPI: HPI: Patient is a 54 year old gentleman who was seen in follow-up for ulceration right foot beneath the first and fifth metatarsal heads as well as a large necrotic ulcer over the lateral aspect of the left midfoot.  Patient has been on doxycycline receives oxycodone from a pain clinic most recent hemoglobin A1c is 10.  Past Medical History:  Diagnosis Date  . Abscess of right foot   . ADHD   . Anemia   . Anxiety   . Arthritis   . Asthma   . COPD (chronic obstructive pulmonary disease) (HCC)   . Diabetes mellitus   . Diabetic foot ulcer (HCC) 07/09/2017  . 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 Foot osteomyelitis, left (HCC)   . Gout   . Hypertension   . Myocardial infarction [facility])    ? 2008,  states he did not have a cath done and never saw a cardiologist  . Neuropathy   . Pancreatitis   . Stroke [facility])    TIA  . Wears glasses     Past Surgical History:  Procedure Laterality Date  . AMPUTATION Left 11/09/2018   Procedure: LEFT TRANSMETATARSAL AMPUTATION;  Surgeon: Nadara Mustard, MD;  Location: [facility] OR;  Service: Orthopedics;  Laterality: Left;  . APPENDECTOMY    . FOOT SURGERY Left   . LAPAROSCOPIC APPENDECTOMY  07/28/2011   Procedure: APPENDECTOMY LAPAROSCOPIC;  Surgeon: Fabio Bering, MD;  Location: [facility];  Service: General;  Laterality: N/A;  . LEG SURGERY      No family history on file. Social History:  reports that he has quit smoking. His smoking use included cigarettes. He has never used smokeless tobacco. He reports current alcohol use. He reports current drug use. Drug: Marijuana.  Allergies:  Allergies  Allergen Reactions  . Aspirin Other (See Comments)    Patient says he's a free bleeder.   . Penicillins Hives and Other (See Comments)    Has patient had a PCN reaction causing immediate rash, facial/tongue/throat  swelling, SOB or lightheadedness with hypotension: YES Has patient had a PCN reaction causing severe rash involving mucus membranes or skin necrosis: NO Has patient had a PCN reaction that required hospitalization: NO Has patient had a PCN reaction occurring within the last 10 years: NO If all of the above answers are "NO", then may proceed with Cephalosporin use.    No medications prior to admission.    Results for orders placed or performed during the hospital encounter of 09/06/19 (from the past 48 hour(s))  Glucose, capillary     Status: Abnormal   Collection Time: 09/06/19  1:40 PM  Result Value Ref Range   Glucose-Capillary 359 (H) 70 - 99 mg/dL    Comment: Glucose reference range applies only to samples taken after fasting for at least 8 hours.   No results found.  Review of Systems  All other systems reviewed and are negative.   There were no vitals taken for this visit. Physical Exam   Patient is alert, oriented, no adenopathy, well-dressed, normal affect, normal respiratory effort. Examination of both feet patient does have a palpable dorsalis pedis pulse.  Patient has a Wagner grade 1 ulcer beneath the right first and fifth metatarsal heads there is no exposed bone or tendon no cellulitis no drainage no signs of infection.  Examination of the left  foot patient has supination of approximately 45 degrees with weightbearing on the lateral aspect of his foot there is a large necrotic ulcer which is exposed bone swelling hyper granulation tissue.Heart RRR Lungs clear Assessment/Plan Visit Diagnoses:  1. Dehiscence of amputation stump (HCC)     Plan: Discussed with the patient his best option for ambulation is to proceed with a transtibial amputation on the left.  We will plan for surgery on Friday.  Anticipate wound care would be appropriate for the [facility] grade 1 ulcers on the right foot.   Bevely Palmer Newel Oien, PA 09/08/2019, 6:44 AM

## 2019-09-22 ENCOUNTER — Telehealth: Payer: Self-pay | Admitting: Orthopedic Surgery

## 2019-09-22 NOTE — Telephone Encounter (Signed)
Working on it now

## 2019-09-22 NOTE — Telephone Encounter (Signed)
I called patient to advise Dr Romeo Apple does not do the type of surgery he needs, and normally we would refer to Dr Lajoyce Corners. I spoke to the wife, patient was outside and could not come to the phone.   She voiced understanding  To you FYI

## 2019-09-22 NOTE — Telephone Encounter (Signed)
We have received a referral from the patient's PCP, [facility] for left foot/ankle.  Patient is requesting 2nd opinion.  He is seeing Dr. Lajoyce Corners.  Please review and advise if you would see this patient for 2nd opinion.  Thanks

## 2019-09-25 ENCOUNTER — Encounter: Payer: Self-pay | Admitting: Orthopedic Surgery

## 2019-09-25 ENCOUNTER — Other Ambulatory Visit: Payer: Self-pay

## 2019-09-25 ENCOUNTER — Ambulatory Visit (INDEPENDENT_AMBULATORY_CARE_PROVIDER_SITE_OTHER): Payer: Medicaid Other | Admitting: Orthopedic Surgery

## 2019-09-25 VITALS — Ht 72.0 in | Wt 252.0 lb

## 2019-09-25 DIAGNOSIS — E1142 Type 2 diabetes mellitus with diabetic polyneuropathy: Secondary | ICD-10-CM

## 2019-09-25 DIAGNOSIS — Z89432 Acquired absence of left foot: Secondary | ICD-10-CM

## 2019-09-25 DIAGNOSIS — I96 Gangrene, not elsewhere classified: Secondary | ICD-10-CM | POA: Diagnosis not present

## 2019-09-25 DIAGNOSIS — T8781 Dehiscence of amputation stump: Secondary | ICD-10-CM | POA: Diagnosis not present

## 2019-09-25 MED ORDER — DOXYCYCLINE HYCLATE 100 MG PO TABS: 100.0000 mg | ORAL_TABLET | Freq: Two times a day (BID) | ORAL | 0 refills | Status: DC

## 2019-09-25 NOTE — Progress Notes (Signed)
Office Visit Note   Patient: [name]           Date of Birth: 08-27-1965           MRN: [id number] Visit Date: 09/25/2019              Requested by: Alvina Filbert, MD [address] [place],  [place] [address] PCP: Alvina Filbert, MD  Chief Complaint  Patient presents with  . Left Foot - Open Wound  . Right Foot - Open Wound      HPI: Patient is a 54 year old gentleman who is seen in follow-up for osteomyelitis and dehiscence left transmetatarsal amputation.  Patient has been scheduled multiple times for surgery he has shown up and then left from the hospital in both showing up at short stay as well as showing up at the emergency room.  Patient states that he is not smoking.  He states he stays at home drinks beer.   Assessment & Plan: Visit Diagnoses:  1. Dehiscence of amputation stump (HCC)   2. Gangrene of left foot (HCC)   3. Status post transmetatarsal amputation of foot, left (HCC)   4. Diabetic polyneuropathy associated with type 2 diabetes mellitus (HCC)     Plan: We will order prescription for doxycycline again reinforced the importance of proceeding with a transtibial amputation on the left.  Patient states he still needs time to think about this he will continue with dry dressing changes daily and doxycycline twice a day.  Follow-Up Instructions: Return in about 2 weeks (around 10/09/2019).   Ortho Exam  Patient is alert, oriented, no adenopathy, well-dressed, normal affect, normal respiratory effort. Examination patient has a strong dorsalis pedis pulse bilaterally.  Is a Wagner grade 1 ulcer beneath the second metatarsal head of the right foot that is 3 cm in diameter there is no exposed bone or tendon.  There is good granulation tissue at the base of the wound.  Examination of the left foot he has a transmetatarsal amputation with swelling cellulitis dehiscence of the wound there is no exposed bone or tendon previous radiographs shows destructive bony  changes.  Imaging: No results found. No images are attached to the encounter.  Labs: Lab Results  Component Value Date   HGBA1C 9.7 (H) 11/09/2018   REPTSTATUS 07/14/2017 FINAL 07/09/2017   REPTSTATUS 07/14/2017 FINAL 07/09/2017   CULT  07/09/2017    NO GROWTH 5 DAYS Performed at [facility], [address], [place], [place] [address]    CULT  07/09/2017    NO GROWTH 5 DAYS Performed at [facility], [address] [place], [place] [address]      Lab Results  Component Value Date   ALBUMIN 2.5 (L) 08/31/2019   ALBUMIN 3.8 05/03/2019   ALBUMIN 3.0 (L) 07/09/2017    Lab Results  Component Value Date   MG 1.4 (L) 07/11/2017   No results found for: VD25OH  No results found for: PREALBUMIN CBC EXTENDED Latest Ref Rng & Units 08/31/2019 05/03/2019 03/23/2019  WBC 4.0 - 10.5 K/uL 8.0 10.1 7.2  RBC 4.22 - 5.81 MIL/uL 3.71(L) 4.09(L) 3.98(L)  HGB 13.0 - 17.0 g/dL 10.6(L) 12.9(L) 12.7(L)  HCT 39.0 - 52.0 % 33.8(L) 38.1(L) 37.0(L)  PLT 150 - 400 K/uL 312 175 208  NEUTROABS 1.7 - 7.7 K/uL 5.9 - 6.0  LYMPHSABS 0.7 - 4.0 K/uL 1.4 - 0.7     Body mass index is 34.18 kg/m.  Orders:  No orders of the defined  types were placed in this encounter.  No orders of the defined types were placed in this encounter.    Procedures: No procedures performed  Clinical Data: No additional findings.  ROS:  All other systems negative, except as noted in the HPI. Review of Systems  Objective: Vital Signs: Ht 6' (1.829 m)   Wt 252 lb (114.3 kg)   BMI 34.18 kg/m   Specialty Comments:  No specialty comments available.  PMFS History: Patient Active Problem List   Diagnosis Date Noted  . Diabetes mellitus type II, non insulin dependent ([place]) 07/09/2017  . Osteomyelitis of left foot ([place]) 07/09/2017  . Hypokalemia 07/09/2017  . Hyponatremia 07/09/2017  . AKI (acute kidney injury) ([place]) 07/09/2017  . Symptomatic anemia 07/09/2017  . Essential hypertension 07/09/2017  .  Depression with anxiety 07/09/2017  . Venous thrombosis 07/09/2017   Past Medical History:  Diagnosis Date  . Abscess of right foot   . ADHD   . Anemia   . Anxiety   . Arthritis   . Asthma   . COPD (chronic obstructive pulmonary disease) ([place])   . Diabetes mellitus   . Diabetic foot ulcer ([place]) 07/09/2017  . Diabetic ulcer of back associated with type 2 diabetes mellitus, limited to breakdown of skin ([place])   . Foot osteomyelitis, left ([place])   . Gout   . Hypertension   . Myocardial infarction [facility])    ? 2008,  states he did not have a cath done and never saw a cardiologist  . Neuropathy   . Pancreatitis   . Stroke [facility])    TIA  . Wears glasses     History reviewed. No pertinent family history.  Past Surgical History:  Procedure Laterality Date  . AMPUTATION Left 11/09/2018   Procedure: LEFT TRANSMETATARSAL AMPUTATION;  Surgeon: Newt Minion, MD;  Location: [place];  Service: Orthopedics;  Laterality: Left;  . APPENDECTOMY    . FOOT SURGERY Left   . LAPAROSCOPIC APPENDECTOMY  07/28/2011   Procedure: APPENDECTOMY LAPAROSCOPIC;  Surgeon: Donato Heinz, MD;  Location: [facility];  Service: General;  Laterality: N/A;  . LEG SURGERY     Social History   Occupational History  . Not on file  Tobacco Use  . Smoking status: Former Smoker    Types: Cigarettes  . Smokeless tobacco: Never Used  . Tobacco comment: quit smoking cigarettes 27 years ago 09/05/19  Substance and Sexual Activity  . Alcohol use: Yes    Comment:  previous daily use; now once a week 09/05/19  . Drug use: Yes    Types: Marijuana    Comment: last use 04/26/19  . Sexual activity: Not on file

## 2019-10-09 ENCOUNTER — Encounter: Payer: Self-pay | Admitting: Orthopedic Surgery

## 2019-10-09 ENCOUNTER — Other Ambulatory Visit: Payer: Self-pay

## 2019-10-09 ENCOUNTER — Ambulatory Visit (INDEPENDENT_AMBULATORY_CARE_PROVIDER_SITE_OTHER): Payer: Medicaid Other | Admitting: Physician Assistant

## 2019-10-09 VITALS — Ht 72.0 in | Wt 252.0 lb

## 2019-10-09 DIAGNOSIS — M86272 Subacute osteomyelitis, left ankle and foot: Secondary | ICD-10-CM | POA: Diagnosis not present

## 2019-10-09 NOTE — Progress Notes (Signed)
Office Visit Note   Patient: [name]           Date of Birth: 09-Nov-1965           MRN: [id number] Visit Date: 10/09/2019              Requested by: Abran Richard, MD [address] [place],  [place] [address] PCP: Abran Richard, MD  Chief Complaint  Patient presents with  . Right Foot - Pain, Follow-up  . Left Foot - Pain, Follow-up      HPI: Patient presents in follow-up for his left wound dehiscence.  He also has a ulcer on the plantar surface of his right foot.  He has been doing daily mupirocin dressing changes also taking doxycycline and he has enough of this for a while.  He is saying he would like to go forward for a left transtibial amputation next Wednesday he denies any fever chills  Assessment & Plan: Visit Diagnoses: No diagnosis found.  Plan: He will continue with dressing changes on both feet.  I will keep him on the doxycycline.  He understands that he is taking a risk not getting his amputation sooner however he knows that if he has any change in his status regarding blood sugars fever or chills he should be admitted to the emergency room  Follow-Up Instructions: No follow-ups on file.   Ortho Exam  Patient is alert, oriented, no adenopathy, well-dressed, normal affect, normal respiratory effort. Right foot 3 cm ulcer.  There is no surrounding foul odor or or cellulitis.  He does have some maceration of the skin in the fourth webspace of his right foot.  Toe is mild erythema.  He has a small lateral spot of drainage but no purulent drainage is just bloody drainage no surrounding cellulitis  Left foot: Wound dehiscence with surrounding erythema cellulitis and swelling.  Good pulses bilaterally.  No exposed bone Heart regular rate and rhythm lungs clear bilaterally  Imaging: No results found. No images are attached to the encounter.  Labs: Lab Results  Component Value Date   HGBA1C 9.7 (H) 11/09/2018   REPTSTATUS 07/14/2017 FINAL 07/09/2017    REPTSTATUS 07/14/2017 FINAL 07/09/2017   CULT  07/09/2017    NO GROWTH 5 DAYS Performed at [facility], [address], [place], [place] [address]    CULT  07/09/2017    NO GROWTH 5 DAYS Performed at [facility], [address], [place], [place] [address]      Lab Results  Component Value Date   ALBUMIN 2.5 (L) 08/31/2019   ALBUMIN 3.8 05/03/2019   ALBUMIN 3.0 (L) 07/09/2017    Lab Results  Component Value Date   MG 1.4 (L) 07/11/2017   No results found for: VD25OH  No results found for: PREALBUMIN CBC EXTENDED Latest Ref Rng & Units 08/31/2019 05/03/2019 03/23/2019  WBC 4.0 - 10.5 K/uL 8.0 10.1 7.2  RBC 4.22 - 5.81 MIL/uL 3.71(L) 4.09(L) 3.98(L)  HGB 13.0 - 17.0 g/dL 10.6(L) 12.9(L) 12.7(L)  HCT 39 - 52 % 33.8(L) 38.1(L) 37.0(L)  PLT 150 - 400 K/uL 312 175 208  NEUTROABS 1.7 - 7.7 K/uL 5.9 - 6.0  LYMPHSABS 0.7 - 4.0 K/uL 1.4 - 0.7     Body mass index is 34.18 kg/m.  Orders:  No orders of the defined types were placed in this encounter.  No orders of the defined types were placed in this encounter.    Procedures: No procedures performed  Clinical Data:  No additional findings.  ROS:  All other systems negative, except as noted in the HPI. Review of Systems  Objective: Vital Signs: Ht 6' (1.829 m)   Wt 252 lb (114.3 kg)   BMI 34.18 kg/m   Specialty Comments:  No specialty comments available.  PMFS History: Patient Active Problem List   Diagnosis Date Noted  . Diabetes mellitus type II, non insulin dependent (HCC) 07/09/2017  . Osteomyelitis of left foot (HCC) 07/09/2017  . Hypokalemia 07/09/2017  . Hyponatremia 07/09/2017  . AKI (acute kidney injury) (HCC) 07/09/2017  . Symptomatic anemia 07/09/2017  . Essential hypertension 07/09/2017  . Depression with anxiety 07/09/2017  . Venous thrombosis 07/09/2017   Past Medical History:  Diagnosis Date  . Abscess of right foot   . ADHD   . Anemia   . Anxiety   . Arthritis   . Asthma   . COPD  (chronic obstructive pulmonary disease) (HCC)   . Diabetes mellitus   . Diabetic foot ulcer (HCC) 07/09/2017  . 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 Foot osteomyelitis, left (HCC)   . Gout   . Hypertension   . Myocardial infarction [facility])    ? 2008,  states he did not have a cath done and never saw a cardiologist  . Neuropathy   . Pancreatitis   . Stroke [facility])    TIA  . Wears glasses     No family history on file.  Past Surgical History:  Procedure Laterality Date  . AMPUTATION Left 11/09/2018   Procedure: LEFT TRANSMETATARSAL AMPUTATION;  Surgeon: Nadara Mustard, MD;  Location: [facility] OR;  Service: Orthopedics;  Laterality: Left;  . APPENDECTOMY    . FOOT SURGERY Left   . LAPAROSCOPIC APPENDECTOMY  07/28/2011   Procedure: APPENDECTOMY LAPAROSCOPIC;  Surgeon: Fabio Bering, MD;  Location[facility];  Service: General;  Laterality: N/A;  . LEG SURGERY     Social History   Occupational History  . Not on file  Tobacco Use  . Smoking status: Former Smoker    Types: Cigarettes  . Smokeless tobacco: Never Used  . Tobacco comment: quit smoking cigarettes 27 years ago 09/05/19  Vaping Use  . Vaping Use: Never used  Substance and Sexual Activity  . Alcohol use: Yes    Comment:  previous daily use; now once a week 09/05/19  . Drug use: Yes    Types: Marijuana    Comment: last use 04/26/19  . Sexual activity: Not on file

## 2019-10-14 ENCOUNTER — Other Ambulatory Visit: Payer: Self-pay | Admitting: Orthopedic Surgery

## 2019-10-16 NOTE — Telephone Encounter (Signed)
Please advise 

## 2019-10-18 ENCOUNTER — Other Ambulatory Visit: Payer: Self-pay | Admitting: Physician Assistant

## 2019-10-20 ENCOUNTER — Encounter (HOSPITAL_COMMUNITY): Admission: RE | Payer: Self-pay | Source: Home / Self Care

## 2019-10-20 ENCOUNTER — Inpatient Hospital Stay (HOSPITAL_COMMUNITY): Admission: RE | Admit: 2019-10-20 | Payer: Medicaid Other | Source: Home / Self Care | Admitting: Orthopedic Surgery

## 2019-10-20 SURGERY — AMPUTATION BELOW KNEE
Anesthesia: Choice | Site: Knee | Laterality: Left

## 2019-11-30 ENCOUNTER — Ambulatory Visit: Payer: Medicaid Other | Admitting: Physician Assistant

## 2019-12-08 ENCOUNTER — Encounter: Payer: Self-pay | Admitting: Physician Assistant

## 2019-12-08 ENCOUNTER — Ambulatory Visit (INDEPENDENT_AMBULATORY_CARE_PROVIDER_SITE_OTHER): Payer: Medicaid Other | Admitting: Physician Assistant

## 2019-12-08 VITALS — Ht 72.0 in | Wt 252.0 lb

## 2019-12-08 DIAGNOSIS — L97521 Non-pressure chronic ulcer of other part of left foot limited to breakdown of skin: Secondary | ICD-10-CM

## 2019-12-08 NOTE — Progress Notes (Signed)
Office Visit Note   Patient: [name]           Date of Birth: 1965/06/16           MRN: [id number] Visit Date: 12/08/2019              Requested by: Alvina Filbert, MD [address] [place],  [place] [address] PCP: Alvina Filbert, MD  Chief Complaint  Patient presents with  . Right Foot - Follow-up  . Left Foot - Follow-up      HPI: This is a pleasant 54 year old gentleman well-known to [place].  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  He does have chronic infection and has been advised to have an amputation.  He is canceled surgery several times.  He also has a ulcer on the bottom of his right foot along with swelling in his fifth toe.  He comes in today because he feels like his pain in his feet is just continuing to get worse especially in the left.  He is getting into chronic pain management to address some of these issues.  Assessment & Plan: Visit Diagnoses: No diagnosis found.  Plan: I had a long talk with the patient today.  Again he realizes that he needs to go forward with a below-knee amputation on the left.  Also likely needs 1/5 toe amputation on the right but he is not willing to entertain this at this time.  I renewed the risks to him of not going forward including sepsis and loss of life.  We also discussed the current situation in emergency departments and that it would be preferable for him to have this done directly in the operating room without a lengthy ER visit.  We discussed the risks of the surgery and the recovery.  I related to him that in my opinion he would feel much better quickly.  He understands and would like to go forward next Wednesday if possible.  He understands to be n.p.o. and not to consume alcohol.  He is going to look into a transportation for later in the day on Wednesday.  Follow-Up Instructions: No follow-ups on file.   Ortho Exam  Patient is alert, oriented, no adenopathy, well-dressed, normal affect, normal  respiratory effort. Massive swelling and purulence from left foot.  No ascending cellulitis above the foot.  Compartments are soft and compressible skin of the lower leg is in good condition.  On the right he has an edematous erythematous toe.  There is no ascending cellulitis he does have a chronic 4 x 4 ulcer which was debrided to bleeding tissue.  There is good vascular central portion of this ulcer.  Noted no surrounding cellulitis.  Imaging: No results found. No images are attached to the encounter.  Labs: Lab Results  Component Value Date   HGBA1C 9.7 (H) 11/09/2018   REPTSTATUS 07/14/2017 FINAL 07/09/2017   REPTSTATUS 07/14/2017 FINAL 07/09/2017   CULT  07/09/2017    NO GROWTH 5 DAYS Performed at [facility], [address], [place], [place] [address]    CULT  07/09/2017    NO GROWTH 5 DAYS Performed at [facility], [address] [place], [place] [address]      Lab Results  Component Value Date   ALBUMIN 2.5 (L) 08/31/2019   ALBUMIN 3.8 05/03/2019   ALBUMIN 3.0 (L) 07/09/2017    Lab Results  Component Value Date   MG 1.4 (L) 07/11/2017   No results  found for: VD25OH  No results found for: PREALBUMIN CBC EXTENDED Latest Ref Rng & Units 08/31/2019 05/03/2019 03/23/2019  WBC 4.0 - 10.5 K/uL 8.0 10.1 7.2  RBC 4.22 - 5.81 MIL/uL 3.71(L) 4.09(L) 3.98(L)  HGB 13.0 - 17.0 g/dL 10.6(L) 12.9(L) 12.7(L)  HCT 39 - 52 % 33.8(L) 38.1(L) 37.0(L)  PLT 150 - 400 K/uL 312 175 208  NEUTROABS 1.7 - 7.7 K/uL 5.9 - 6.0  LYMPHSABS 0.7 - 4.0 K/uL 1.4 - 0.7     Body mass index is 34.18 kg/m.  Orders:  No orders of the defined types were placed in this encounter.  No orders of the defined types were placed in this encounter.    Procedures: No procedures performed  Clinical Data: No additional findings.  ROS:  All other systems negative, except as noted in the HPI. Review of Systems  Objective: Vital Signs: Ht 6' (1.829 m)   Wt 252 lb (114.3 kg)   BMI 34.18 kg/m    Specialty Comments:  No specialty comments available.  PMFS History: Patient Active Problem List   Diagnosis Date Noted  . Diabetes mellitus type II, non insulin dependent (HCC) 07/09/2017  . Osteomyelitis of left foot (HCC) 07/09/2017  . Hypokalemia 07/09/2017  . Hyponatremia 07/09/2017  . AKI (acute kidney injury) (HCC) 07/09/2017  . Symptomatic anemia 07/09/2017  . Essential hypertension 07/09/2017  . Depression with anxiety 07/09/2017  . Venous thrombosis 07/09/2017   Past Medical History:  Diagnosis Date  . Abscess of right foot   . ADHD   . Anemia   . Anxiety   . Arthritis   . Asthma   . COPD (chronic obstructive pulmonary disease) (HCC)   . Diabetes mellitus   . Diabetic foot ulcer (HCC) 07/09/2017  . 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 Foot osteomyelitis, left (HCC)   . Gout   . Hypertension   . Myocardial infarction [facility])    ? 2008,  states he did not have a cath done and never saw a cardiologist  . Neuropathy   . Pancreatitis   . Stroke [facility])    TIA  . Wears glasses     No family history on file.  Past Surgical History:  Procedure Laterality Date  . AMPUTATION Left 11/09/2018   Procedure: LEFT TRANSMETATARSAL AMPUTATION;  Surgeon: Nadara Mustard, MD;  Location: [facility] OR;  Service: Orthopedics;  Laterality: Left;  . APPENDECTOMY    . FOOT SURGERY Left   . LAPAROSCOPIC APPENDECTOMY  07/28/2011   Procedure: APPENDECTOMY LAPAROSCOPIC;  Surgeon: Fabio Bering, MD;  Location[facility];  Service: General;  Laterality: N/A;  . LEG SURGERY     Social History   Occupational History  . Not on file  Tobacco Use  . Smoking status: Former Smoker    Types: Cigarettes  . Smokeless tobacco: Never Used  . Tobacco comment: quit smoking cigarettes 27 years ago 09/05/19  Vaping Use  . Vaping Use: Never used  Substance and Sexual Activity  . Alcohol use: Yes    Comment:  previous daily use; now once a  week 09/05/19  . Drug use: Yes    Types: Marijuana    Comment: last use 04/26/19  . Sexual activity: Not on file

## 2019-12-11 ENCOUNTER — Other Ambulatory Visit: Payer: Self-pay | Admitting: Physician Assistant

## 2019-12-12 ENCOUNTER — Encounter (HOSPITAL_COMMUNITY): Payer: Self-pay | Admitting: Orthopedic Surgery

## 2019-12-12 ENCOUNTER — Other Ambulatory Visit: Payer: Self-pay

## 2019-12-12 NOTE — Progress Notes (Signed)
[name] denies chest pain or shortness of breath.  Patient denies  being in contact with anyone with s/s of Covid, patient and he wife do not have symptoms.  Patient will be tested for Covid after arrival. [name] has type II diabetes, patient is out of strips for CBG machine. I instructed patient to take 1/2 of scheduled NPH at Bedtime.

## 2019-12-13 ENCOUNTER — Encounter (HOSPITAL_COMMUNITY)
Admission: RE | Disposition: A | Discharge status: Home / Self Care | Payer: Self-pay | Source: Home / Self Care | Attending: Orthopedic Surgery

## 2019-12-13 ENCOUNTER — Encounter (HOSPITAL_COMMUNITY): Payer: Self-pay | Admitting: Orthopedic Surgery

## 2019-12-13 ENCOUNTER — Ambulatory Visit (HOSPITAL_COMMUNITY): Payer: Medicaid Other | Admitting: Certified Registered Nurse Anesthetist

## 2019-12-13 ENCOUNTER — Observation Stay (HOSPITAL_COMMUNITY)
Admission: RE | Admit: 2019-12-13 | Discharge: 2019-12-14 | Disposition: A | Discharge status: Home / Self Care | Payer: Medicaid Other | Attending: Orthopedic Surgery | Admitting: Orthopedic Surgery

## 2019-12-13 DIAGNOSIS — E1140 Type 2 diabetes mellitus with diabetic neuropathy, unspecified: Secondary | ICD-10-CM | POA: Insufficient documentation

## 2019-12-13 DIAGNOSIS — M1990 Unspecified osteoarthritis, unspecified site: Secondary | ICD-10-CM | POA: Insufficient documentation

## 2019-12-13 DIAGNOSIS — J449 Chronic obstructive pulmonary disease, unspecified: Secondary | ICD-10-CM | POA: Diagnosis not present

## 2019-12-13 DIAGNOSIS — I10 Essential (primary) hypertension: Secondary | ICD-10-CM | POA: Insufficient documentation

## 2019-12-13 DIAGNOSIS — I8290 Acute embolism and thrombosis of unspecified vein: Secondary | ICD-10-CM

## 2019-12-13 DIAGNOSIS — I252 Old myocardial infarction: Secondary | ICD-10-CM | POA: Insufficient documentation

## 2019-12-13 DIAGNOSIS — F419 Anxiety disorder, unspecified: Secondary | ICD-10-CM | POA: Diagnosis not present

## 2019-12-13 DIAGNOSIS — Z7984 Long term (current) use of oral hypoglycemic drugs: Secondary | ICD-10-CM | POA: Diagnosis not present

## 2019-12-13 DIAGNOSIS — M86272 Subacute osteomyelitis, left ankle and foot: Secondary | ICD-10-CM | POA: Diagnosis present

## 2019-12-13 DIAGNOSIS — Z8673 Personal history of transient ischemic attack (TIA), and cerebral infarction without residual deficits: Secondary | ICD-10-CM | POA: Diagnosis not present

## 2019-12-13 DIAGNOSIS — L02612 Cutaneous abscess of left foot: Secondary | ICD-10-CM | POA: Diagnosis not present

## 2019-12-13 DIAGNOSIS — Z20822 Contact with and (suspected) exposure to covid-19: Secondary | ICD-10-CM | POA: Diagnosis not present

## 2019-12-13 DIAGNOSIS — Z7982 Long term (current) use of aspirin: Secondary | ICD-10-CM | POA: Diagnosis not present

## 2019-12-13 DIAGNOSIS — Z79899 Other long term (current) drug therapy: Secondary | ICD-10-CM | POA: Diagnosis not present

## 2019-12-13 HISTORY — PX: AMPUTATION: SHX166

## 2019-12-13 HISTORY — DX: Personal history of other medical treatment: Z92.89

## 2019-12-13 HISTORY — DX: Gastro-esophageal reflux disease without esophagitis: K21.9

## 2019-12-13 LAB — SURGICAL PCR SCREEN
MRSA, PCR: POSITIVE — AB
Staphylococcus aureus: POSITIVE — AB

## 2019-12-13 LAB — CBC
HCT: 32.3 % — ABNORMAL LOW (ref 39.0–52.0)
Hemoglobin: 10.1 g/dL — ABNORMAL LOW (ref 13.0–17.0)
MCH: 27.6 pg (ref 26.0–34.0)
MCHC: 31.3 g/dL (ref 30.0–36.0)
MCV: 88.3 fL (ref 80.0–100.0)
Platelets: 251 10*3/uL (ref 150–400)
RBC: 3.66 MIL/uL — ABNORMAL LOW (ref 4.22–5.81)
RDW: 13.3 % (ref 11.5–15.5)
WBC: 7.5 10*3/uL (ref 4.0–10.5)
nRBC: 0 % (ref 0.0–0.2)

## 2019-12-13 LAB — COMPREHENSIVE METABOLIC PANEL
ALT: 18 U/L (ref 0–44)
AST: 52 U/L — ABNORMAL HIGH (ref 15–41)
Albumin: 2.9 g/dL — ABNORMAL LOW (ref 3.5–5.0)
Alkaline Phosphatase: 86 U/L (ref 38–126)
Anion gap: 10 (ref 5–15)
BUN: 6 mg/dL (ref 6–20)
CO2: 23 mmol/L (ref 22–32)
Calcium: 8.5 mg/dL — ABNORMAL LOW (ref 8.9–10.3)
Chloride: 93 mmol/L — ABNORMAL LOW (ref 98–111)
Creatinine, Ser: 0.64 mg/dL (ref 0.61–1.24)
GFR calc Af Amer: >60 mL/min (ref >60)
GFR calc non Af Amer: >60 mL/min (ref >60)
Glucose, Bld: 305 mg/dL — ABNORMAL HIGH (ref 70–99)
Potassium: 3.8 mmol/L (ref 3.5–5.1)
Sodium: 126 mmol/L — ABNORMAL LOW (ref 135–145)
Total Bilirubin: 0.8 mg/dL (ref 0.3–1.2)
Total Protein: 7.7 g/dL (ref 6.5–8.1)

## 2019-12-13 LAB — SARS CORONAVIRUS 2 BY RT PCR (HOSPITAL ORDER, PERFORMED IN ~~LOC~~ HOSPITAL LAB): SARS Coronavirus 2: NEGATIVE

## 2019-12-13 LAB — GLUCOSE, CAPILLARY
Glucose-Capillary: 363 mg/dL — ABNORMAL HIGH (ref 70–99)
Glucose-Capillary: 287 mg/dL — ABNORMAL HIGH (ref 70–99)
Glucose-Capillary: 197 mg/dL — ABNORMAL HIGH (ref 70–99)
Glucose-Capillary: 319 mg/dL — ABNORMAL HIGH (ref 70–99)

## 2019-12-13 LAB — HEMOGLOBIN A1C
Hgb A1c MFr Bld: 11.4 % — ABNORMAL HIGH (ref 4.8–5.6)
Mean Plasma Glucose: 280.48 mg/dL

## 2019-12-13 SURGERY — AMPUTATION BELOW KNEE
Anesthesia: Regional | Site: Knee | Laterality: Left

## 2019-12-13 MED ORDER — ASPIRIN EC 81 MG PO TBEC
81.0000 mg | DELAYED_RELEASE_TABLET | Freq: Every day | ORAL | Status: DC
  Administered 2019-12-13 – 2019-12-14 (×2): 81 mg via ORAL
  Filled 2019-12-13 (×2): qty 1

## 2019-12-13 MED ORDER — PRAVASTATIN SODIUM 10 MG PO TABS
20.0000 mg | ORAL_TABLET | Freq: Every day | ORAL | Status: DC
  Administered 2019-12-14: 20 mg via ORAL
  Filled 2019-12-13: qty 2

## 2019-12-13 MED ORDER — FENTANYL CITRATE (PF) 100 MCG/2ML IJ SOLN: 50.0000 ug | Freq: Once | INTRAMUSCULAR | Status: AC

## 2019-12-13 MED ORDER — MIDAZOLAM HCL 2 MG/2ML IJ SOLN
INTRAMUSCULAR | Status: AC
  Administered 2019-12-13: 2 mg via INTRAVENOUS
  Filled 2019-12-13: qty 2

## 2019-12-13 MED ORDER — HYDROMORPHONE HCL 1 MG/ML IJ SOLN
INTRAMUSCULAR | Status: AC
Start: 2019-12-13 — End: 2019-12-14
  Filled 2019-12-13: qty 1

## 2019-12-13 MED ORDER — METHOCARBAMOL 1000 MG/10ML IJ SOLN
500.0000 mg | Freq: Four times a day (QID) | INTRAVENOUS | Status: DC | PRN
  Filled 2019-12-13: qty 5

## 2019-12-13 MED ORDER — ONDANSETRON HCL 4 MG/2ML IJ SOLN: 4.0000 mg | Freq: Four times a day (QID) | INTRAMUSCULAR | Status: DC | PRN

## 2019-12-13 MED ORDER — ALBUTEROL SULFATE (2.5 MG/3ML) 0.083% IN NEBU: 2.5000 mg | INHALATION_SOLUTION | RESPIRATORY_TRACT | Status: DC | PRN

## 2019-12-13 MED ORDER — LACTATED RINGERS IV SOLN: INTRAVENOUS | Status: DC

## 2019-12-13 MED ORDER — INSULIN ASPART 100 UNIT/ML ~~LOC~~ SOLN
15.0000 [IU] | Freq: Once | SUBCUTANEOUS | Status: AC
  Administered 2019-12-13: 10 [IU] via SUBCUTANEOUS
  Filled 2019-12-13: qty 0.15

## 2019-12-13 MED ORDER — ACETAMINOPHEN 325 MG PO TABS: 325.0000 mg | ORAL_TABLET | Freq: Four times a day (QID) | ORAL | Status: DC | PRN

## 2019-12-13 MED ORDER — MIDAZOLAM HCL 2 MG/2ML IJ SOLN: 2.0000 mg | Freq: Once | INTRAMUSCULAR | Status: AC

## 2019-12-13 MED ORDER — SUCCINYLCHOLINE CHLORIDE 20 MG/ML IJ SOLN
INTRAMUSCULAR | Status: DC | PRN
  Administered 2019-12-13: 120 mg via INTRAVENOUS

## 2019-12-13 MED ORDER — METOCLOPRAMIDE HCL 5 MG PO TABS: 5.0000 mg | ORAL_TABLET | Freq: Three times a day (TID) | ORAL | Status: DC | PRN

## 2019-12-13 MED ORDER — DOCUSATE SODIUM 100 MG PO CAPS
100.0000 mg | ORAL_CAPSULE | Freq: Two times a day (BID) | ORAL | Status: DC
  Administered 2019-12-13 – 2019-12-14 (×3): 100 mg via ORAL
  Filled 2019-12-13 (×3): qty 1

## 2019-12-13 MED ORDER — METFORMIN HCL 500 MG PO TABS
1000.0000 mg | ORAL_TABLET | Freq: Two times a day (BID) | ORAL | Status: DC
  Administered 2019-12-14 (×2): 1000 mg via ORAL
  Filled 2019-12-13 (×2): qty 2

## 2019-12-13 MED ORDER — BUPIVACAINE HCL (PF) 0.25 % IJ SOLN
INTRAMUSCULAR | Status: DC | PRN
  Administered 2019-12-13: 30 mL

## 2019-12-13 MED ORDER — FENTANYL CITRATE (PF) 100 MCG/2ML IJ SOLN
INTRAMUSCULAR | Status: AC
  Administered 2019-12-13: 50 ug via INTRAVENOUS
  Filled 2019-12-13: qty 2

## 2019-12-13 MED ORDER — DIPHENHYDRAMINE HCL 25 MG PO CAPS
25.0000 mg | ORAL_CAPSULE | Freq: Every evening | ORAL | Status: DC | PRN
  Filled 2019-12-13: qty 1

## 2019-12-13 MED ORDER — CHLORHEXIDINE GLUCONATE 0.12 % MT SOLN
OROMUCOSAL | Status: AC
  Administered 2019-12-13: 15 mL via OROMUCOSAL
  Filled 2019-12-13: qty 15

## 2019-12-13 MED ORDER — OXYCODONE HCL ER 10 MG PO T12A
10.0000 mg | EXTENDED_RELEASE_TABLET | Freq: Two times a day (BID) | ORAL | Status: DC
  Administered 2019-12-14 (×2): 10 mg via ORAL
  Filled 2019-12-13 (×2): qty 1

## 2019-12-13 MED ORDER — OXYCODONE ER 13.5 MG PO C12A: 1.0000 | EXTENDED_RELEASE_CAPSULE | Freq: Two times a day (BID) | ORAL | Status: DC

## 2019-12-13 MED ORDER — INSULIN NPH (HUMAN) (ISOPHANE) 100 UNIT/ML ~~LOC~~ SUSP
8.0000 [IU] | Freq: Every day | SUBCUTANEOUS | Status: DC
  Filled 2019-12-13: qty 10

## 2019-12-13 MED ORDER — ONDANSETRON HCL 4 MG PO TABS: 4.0000 mg | ORAL_TABLET | Freq: Four times a day (QID) | ORAL | Status: DC | PRN

## 2019-12-13 MED ORDER — CHLORHEXIDINE GLUCONATE 0.12 % MT SOLN: 15.0000 mL | Freq: Once | OROMUCOSAL | Status: AC

## 2019-12-13 MED ORDER — HYDROMORPHONE HCL 1 MG/ML IJ SOLN
0.2500 mg | INTRAMUSCULAR | Status: DC | PRN
  Administered 2019-12-13: 0.25 mg via INTRAVENOUS

## 2019-12-13 MED ORDER — PROMETHAZINE HCL 25 MG/ML IJ SOLN: 6.2500 mg | INTRAMUSCULAR | Status: DC | PRN

## 2019-12-13 MED ORDER — INSULIN ASPART 100 UNIT/ML ~~LOC~~ SOLN
0.0000 [IU] | Freq: Three times a day (TID) | SUBCUTANEOUS | Status: DC
  Administered 2019-12-14: 15 [IU] via SUBCUTANEOUS
  Administered 2019-12-14: 8 [IU] via SUBCUTANEOUS
  Administered 2019-12-14: 11 [IU] via SUBCUTANEOUS
  Filled 2019-12-13: qty 0.15

## 2019-12-13 MED ORDER — PROPOFOL 10 MG/ML IV BOLUS
INTRAVENOUS | Status: DC | PRN
  Administered 2019-12-13: 200 mg via INTRAVENOUS
  Administered 2019-12-13 (×3): 100 mg via INTRAVENOUS

## 2019-12-13 MED ORDER — 0.9 % SODIUM CHLORIDE (POUR BTL) OPTIME
TOPICAL | Status: DC | PRN
  Administered 2019-12-13: 1000 mL

## 2019-12-13 MED ORDER — DEXMEDETOMIDINE (PRECEDEX) IN NS 20 MCG/5ML (4 MCG/ML) IV SYRINGE
PREFILLED_SYRINGE | INTRAVENOUS | Status: DC | PRN
  Administered 2019-12-13: 4 ug via INTRAVENOUS
  Administered 2019-12-13 (×2): 8 ug via INTRAVENOUS

## 2019-12-13 MED ORDER — ORAL CARE MOUTH RINSE: 15.0000 mL | Freq: Once | OROMUCOSAL | Status: AC

## 2019-12-13 MED ORDER — GABAPENTIN 600 MG PO TABS
1200.0000 mg | ORAL_TABLET | Freq: Three times a day (TID) | ORAL | Status: DC
  Administered 2019-12-13 – 2019-12-14 (×4): 1200 mg via ORAL
  Filled 2019-12-13 (×4): qty 2

## 2019-12-13 MED ORDER — OXYCODONE HCL 5 MG PO TABS
5.0000 mg | ORAL_TABLET | ORAL | Status: DC | PRN
  Administered 2019-12-13 – 2019-12-14 (×4): 10 mg via ORAL
  Filled 2019-12-13 (×4): qty 2

## 2019-12-13 MED ORDER — MEPERIDINE HCL 25 MG/ML IJ SOLN: 6.2500 mg | INTRAMUSCULAR | Status: DC | PRN

## 2019-12-13 MED ORDER — BUPIVACAINE LIPOSOME 1.3 % IJ SUSP
INTRAMUSCULAR | Status: DC | PRN
  Administered 2019-12-13: 10 mL via PERINEURAL

## 2019-12-13 MED ORDER — ALBUTEROL SULFATE HFA 108 (90 BASE) MCG/ACT IN AERS: 2.0000 | INHALATION_SPRAY | RESPIRATORY_TRACT | Status: DC | PRN

## 2019-12-13 MED ORDER — PANTOPRAZOLE SODIUM 40 MG PO TBEC
40.0000 mg | DELAYED_RELEASE_TABLET | Freq: Every day | ORAL | Status: DC
  Administered 2019-12-13 – 2019-12-14 (×2): 40 mg via ORAL
  Filled 2019-12-13 (×2): qty 1

## 2019-12-13 MED ORDER — CLINDAMYCIN PHOSPHATE 900 MG/50ML IV SOLN
INTRAVENOUS | Status: AC
  Filled 2019-12-13: qty 50

## 2019-12-13 MED ORDER — LOSARTAN POTASSIUM 50 MG PO TABS
50.0000 mg | ORAL_TABLET | Freq: Every day | ORAL | Status: DC
  Administered 2019-12-14: 50 mg via ORAL
  Filled 2019-12-13: qty 1

## 2019-12-13 MED ORDER — HYDROMORPHONE HCL 1 MG/ML IJ SOLN: 0.5000 mg | INTRAMUSCULAR | Status: DC | PRN

## 2019-12-13 MED ORDER — DEXAMETHASONE SODIUM PHOSPHATE 10 MG/ML IJ SOLN
INTRAMUSCULAR | Status: DC | PRN
  Administered 2019-12-13: 4 mg via INTRAVENOUS

## 2019-12-13 MED ORDER — METHOCARBAMOL 500 MG PO TABS: 500.0000 mg | ORAL_TABLET | Freq: Four times a day (QID) | ORAL | Status: DC | PRN

## 2019-12-13 MED ORDER — PHENYLEPHRINE HCL-NACL 10-0.9 MG/250ML-% IV SOLN: INTRAVENOUS | Status: DC | PRN

## 2019-12-13 MED ORDER — ONDANSETRON HCL 4 MG/2ML IJ SOLN
INTRAMUSCULAR | Status: DC | PRN
  Administered 2019-12-13: 4 mg via INTRAVENOUS

## 2019-12-13 MED ORDER — OXYCODONE HCL 10 MG PO TABS
10.0000 mg | ORAL_TABLET | Freq: Three times a day (TID) | ORAL | Status: DC | PRN
Start: 2019-12-13 — End: 2019-12-13

## 2019-12-13 MED ORDER — LIDOCAINE 2% (20 MG/ML) 5 ML SYRINGE
INTRAMUSCULAR | Status: DC | PRN
  Administered 2019-12-13: 20 mg via INTRAVENOUS

## 2019-12-13 MED ORDER — CLINDAMYCIN PHOSPHATE 900 MG/50ML IV SOLN
900.0000 mg | INTRAVENOUS | Status: AC
  Administered 2019-12-13: 900 mg via INTRAVENOUS
  Filled 2019-12-13: qty 50

## 2019-12-13 MED ORDER — SODIUM CHLORIDE 0.9 % IV SOLN: INTRAVENOUS | Status: DC

## 2019-12-13 MED ORDER — PHENYLEPHRINE 40 MCG/ML (10ML) SYRINGE FOR IV PUSH (FOR BLOOD PRESSURE SUPPORT)
PREFILLED_SYRINGE | INTRAVENOUS | Status: DC | PRN
  Administered 2019-12-13: 80 ug via INTRAVENOUS

## 2019-12-13 MED ORDER — CLINDAMYCIN PHOSPHATE 900 MG/50ML IV SOLN
900.0000 mg | Freq: Three times a day (TID) | INTRAVENOUS | Status: AC
  Administered 2019-12-13 – 2019-12-14 (×2): 900 mg via INTRAVENOUS
  Filled 2019-12-13 (×2): qty 50

## 2019-12-13 MED ORDER — AMITRIPTYLINE HCL 50 MG PO TABS
100.0000 mg | ORAL_TABLET | Freq: Every day | ORAL | Status: DC
  Administered 2019-12-13 – 2019-12-14 (×2): 100 mg via ORAL
  Filled 2019-12-13 (×2): qty 2

## 2019-12-13 MED ORDER — METOCLOPRAMIDE HCL 5 MG/ML IJ SOLN: 5.0000 mg | Freq: Three times a day (TID) | INTRAMUSCULAR | Status: DC | PRN

## 2019-12-13 SURGICAL SUPPLY — 41 items
BLADE SAW RECIP 87.9 MT (BLADE) ×2 IMPLANT
BLADE SURG 21 STRL SS (BLADE) ×2 IMPLANT
BNDG COHESIVE 6X5 TAN STRL LF (GAUZE/BANDAGES/DRESSINGS) ×2 IMPLANT
CANISTER WOUND CARE 500ML ATS (WOUND CARE) ×2 IMPLANT
CANISTER WOUNDNEG PRESSURE 500 (CANNISTER) ×2 IMPLANT
COVER SURGICAL LIGHT HANDLE (MISCELLANEOUS) ×2 IMPLANT
COVER WAND RF STERILE (DRAPES) IMPLANT
CUFF TOURN SGL QUICK 34 (TOURNIQUET CUFF) ×2
CUFF TRNQT CYL 34X4.125X (TOURNIQUET CUFF) ×1 IMPLANT
DRAPE INCISE IOBAN 66X45 STRL (DRAPES) ×2 IMPLANT
DRAPE U-SHAPE 47X51 STRL (DRAPES) ×2 IMPLANT
DRESSING PREVENA PLUS CUSTOM (GAUZE/BANDAGES/DRESSINGS) ×1 IMPLANT
DRSG PREVENA PLUS CUSTOM (GAUZE/BANDAGES/DRESSINGS) ×2
DURAPREP 26ML APPLICATOR (WOUND CARE) ×2 IMPLANT
ELECT REM PT RETURN 9FT ADLT (ELECTROSURGICAL) ×2
ELECTRODE REM PT RTRN 9FT ADLT (ELECTROSURGICAL) ×1 IMPLANT
GLOVE BIOGEL PI IND STRL 9 (GLOVE) ×1 IMPLANT
GLOVE BIOGEL PI INDICATOR 9 (GLOVE) ×1
GLOVE SURG ORTHO 9.0 STRL STRW (GLOVE) ×2 IMPLANT
GOWN STRL REUS W/ TWL XL LVL3 (GOWN DISPOSABLE) ×2 IMPLANT
GOWN STRL REUS W/TWL XL LVL3 (GOWN DISPOSABLE) ×2
KIT BASIN OR (CUSTOM PROCEDURE TRAY) ×2 IMPLANT
KIT TURNOVER KIT B (KITS) ×2 IMPLANT
MANIFOLD NEPTUNE II (INSTRUMENTS) ×2 IMPLANT
NS IRRIG 1000ML POUR BTL (IV SOLUTION) ×2 IMPLANT
PACK ORTHO EXTREMITY (CUSTOM PROCEDURE TRAY) ×2 IMPLANT
PAD ARMBOARD 7.5X6 YLW CONV (MISCELLANEOUS) ×2 IMPLANT
PREVENA RESTOR ARTHOFORM 46X30 (CANNISTER) ×2 IMPLANT
SPONGE LAP 18X18 RF (DISPOSABLE) IMPLANT
STAPLER VISISTAT 35W (STAPLE) ×2 IMPLANT
STOCKINETTE IMPERVIOUS 9X36 MD (GAUZE/BANDAGES/DRESSINGS) ×2 IMPLANT
STOCKINETTE IMPERVIOUS LG (DRAPES) ×2 IMPLANT
SUT ETHILON 2 0 PSLX (SUTURE) IMPLANT
SUT SILK 2 0 (SUTURE) ×1
SUT SILK 2-0 18XBRD TIE 12 (SUTURE) ×1 IMPLANT
SUT VIC AB 0 CTX 36 (SUTURE) ×1
SUT VIC AB 0 CTX36XBRD ANTBCTR (SUTURE) ×1 IMPLANT
SUT VIC AB 1 CTX 27 (SUTURE) ×6 IMPLANT
TOWEL GREEN STERILE (TOWEL DISPOSABLE) ×2 IMPLANT
TUBE CONNECTING 12X1/4 (SUCTIONS) ×2 IMPLANT
YANKAUER SUCT BULB TIP NO VENT (SUCTIONS) ×2 IMPLANT

## 2019-12-13 NOTE — Anesthesia Procedure Notes (Signed)
Anesthesia Regional Block: Popliteal block   Pre-Anesthetic Checklist: ,, timeout performed, Correct Patient, Correct Site, Correct Laterality, Correct Procedure, Correct Position, site marked, Risks and benefits discussed,  Surgical consent,  Pre-op evaluation,  At surgeon's request and post-op pain management  Laterality: Left  Prep: Maximum Sterile Barrier Precautions used, chloraprep       Needles:  Injection technique: Single-shot  Needle Type: Echogenic Stimulator Needle     Needle Length: 9cm  Needle Gauge: 22     Additional Needles:   Procedures:,,,, ultrasound used (permanent image in chart),,,,  Narrative:  Start time: 12/13/2019 4:10 PM End time: 12/13/2019 4:15 PM Injection made incrementally with aspirations every 5 mL.  Performed by: Personally  Anesthesiologist: Lannie Fields, DO  Additional Notes: Monitors applied. No increased pain on injection. No increased resistance to injection. Injection made in 5cc increments. Good needle visualization. Patient tolerated procedure well.

## 2019-12-13 NOTE — Anesthesia Procedure Notes (Signed)
Anesthesia Regional Block: Adductor canal block   Pre-Anesthetic Checklist: ,, timeout performed, Correct Patient, Correct Site, Correct Laterality, Correct Procedure, Correct Position, site marked, Risks and benefits discussed,  Surgical consent,  Pre-op evaluation,  At surgeon's request and post-op pain management  Laterality: Left  Prep: Maximum Sterile Barrier Precautions used, chloraprep       Needles:  Injection technique: Single-shot  Needle Type: Echogenic Stimulator Needle     Needle Length: 9cm  Needle Gauge: 22     Additional Needles:   Procedures:,,,, ultrasound used (permanent image in chart),,,,  Narrative:  Start time: 12/13/2019 4:05 PM End time: 12/13/2019 4:10 PM Injection made incrementally with aspirations every 5 mL.  Performed by: Personally  Anesthesiologist: Lannie Fields, DO  Additional Notes: Monitors applied. No increased pain on injection. No increased resistance to injection. Injection made in 5cc increments. Good needle visualization. Patient tolerated procedure well.

## 2019-12-13 NOTE — H&P (Signed)
[name] is an 54 y.o. male.   Chief Complaint: Left Foot Ostewomyelitis HPI: This is a pleasant 54 year old gentleman well-known to [place].  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  He does have chronic infection and has been advised to have an amputation.  He is canceled surgery several times.  He also has a ulcer on the bottom of his right foot along with swelling in his fifth toe.  He comes in today because he feels like his pain in his feet is just continuing to get worse especially in the left.  He is getting into chronic pain management to address some of these issues.  Past Medical History:  Diagnosis Date  . Abscess of right foot   . ADHD   . Anemia   . Anxiety   . Arthritis   . Asthma   . COPD (chronic obstructive pulmonary disease) (HCC)   . Diabetes mellitus   . Diabetic foot ulcer (HCC) 07/09/2017  . 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 Foot osteomyelitis, left (HCC)   . GERD (gastroesophageal reflux disease)   . Gout   . History of blood transfusion   . Hypertension   . Myocardial infarction [facility])    ? 2008,  states he did not have a cath done and never saw a cardiologist  . Neuropathy   . Pancreatitis   . Stroke [facility])    TIA- no residual  . Wears glasses     Past Surgical History:  Procedure Laterality Date  . AMPUTATION Left 11/09/2018   Procedure: LEFT TRANSMETATARSAL AMPUTATION;  Surgeon: William Mustard, MD;  Location: [facility] OR;  Service: Orthopedics;  Laterality: Left;  . APPENDECTOMY    . FOOT SURGERY Left   . LAPAROSCOPIC APPENDECTOMY  07/28/2011   Procedure: APPENDECTOMY LAPAROSCOPIC;  Surgeon: William Bering, MD;  Location: [facility] ORS;  Service: General;  Laterality: N/A;  . LEG SURGERY      No family history on file. Social History:  reports that he has quit smoking. His smoking use included cigarettes. He has never used smokeless tobacco. He reports current alcohol use. He  reports current drug use. Drug: Marijuana.  Allergies:  Allergies  Allergen Reactions  . Aspirin Other (See Comments)    Patient says he's a free bleeder, tolerates low dose aspirin  . Penicillins Hives and Other (See Comments)    Has patient had a PCN reaction causing immediate rash, facial/tongue/throat swelling, SOB or lightheadedness with hypotension: YES Has patient had a PCN reaction causing severe rash involving mucus membranes or skin necrosis: NO Has patient had a PCN reaction that required hospitalization: NO Has patient had a PCN reaction occurring within the last 10 years: NO If all of the above answers are "NO", then may proceed with Cephalosporin use.    No medications prior to admission.    No results found for this or any previous visit (from the past 48 hour(s)). No results found.  Review of Systems  All other systems reviewed and are negative.   There were no vitals taken for this visit. Physical Exam  This is a pleasant 54 year old gentleman well-known to [place].  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  He does have chronic infection and has been advised to have an amputation.  He is canceled surgery several times.  He also has a ulcer on the bottom of  his right foot along with swelling in his fifth toe.  He comes in today because he feels like his pain in his feet is just continuing to get worse especially in the left.  He is getting into chronic pain management to address some of these issues.Heart RRR Lungs Clear Assessment/Plan Plan: I had a long talk with the patient today.  Again he realizes that he needs to go forward with a below-knee amputation on the left.  Also likely needs 1/5 toe amputation on the right but he is not willing to entertain this at this time.  I renewed the risks to him of not going forward including sepsis and loss of life.  We also discussed the current situation in emergency departments and that it would be preferable for him  to have this done directly in the operating room without a lengthy ER visit.  We discussed the risks of the surgery and the recovery.  I related to him that in my opinion he would feel much better quickly.  He understands and would like to go forward next Wednesday if possible.  He understands to be n.p.o. and not to consume alcohol.  He is going to look into a transportation for later in the day on Wednesday.  William Bali Whitlee Sluder, PA 12/13/2019, 6:55 AM

## 2019-12-13 NOTE — Op Note (Signed)
   Date of Surgery: 12/13/2019  INDICATIONS: [name] is a 54 y.o.-year-old male who is over a year out from a transmetatarsal amputation he has developed ulceration and osteomyelitis he presents at this time for transtibial amputation.Marland Kitchen  PREOPERATIVE DIAGNOSIS: Osteomyelitis ulceration left transmetatarsal amputation  POSTOPERATIVE DIAGNOSIS: Same.  PROCEDURE: Transtibial amputation Application of Prevena wound VAC  SURGEON: Lajoyce Corners, M.D.  ANESTHESIA:  general  IV FLUIDS AND URINE: See anesthesia.  ESTIMATED BLOOD LOSS: Minimal mL.  COMPLICATIONS: None.  DESCRIPTION OF PROCEDURE: The patient was brought to the operating room and underwent a general anesthetic. After adequate levels of anesthesia were obtained patient's lower extremity was prepped using DuraPrep draped into a sterile field. A timeout was called. The foot was draped out of the sterile field with impervious stockinette. A transverse incision was made 11 cm distal to the tibial tubercle. This curved proximally and a large posterior flap was created. The tibia was transected 1 cm proximal to the skin incision. The fibula was transected just proximal to the tibial incision. The tibia was beveled anteriorly. A large posterior flap was created. The sciatic nerve was pulled cut and allowed to retract. The vascular bundles were suture ligated with 2-0 silk. The deep and superficial fascial layers were closed using #1 Vicryl. The skin was closed using staples and 2-0 nylon. The wound was covered with a Prevena wound VAC. There was a good suction fit. A prosthetic shrinker was applied. Patient was extubated taken to the PACU in stable condition.   DISCHARGE PLANNING:  Antibiotic duration: 24 hours  Weightbearing: Nonweightbearing on the left  Pain medication: Opioid pathway  Dressing care/ Wound VAC: Continue wound VAC for 1 week  Discharge to: Patient states that he wishes to be discharged to home in a day or 2.  Follow-up:  In the office 1 week post operative.  William Baker, MD [facility] 5:52 PM

## 2019-12-13 NOTE — Anesthesia Preprocedure Evaluation (Addendum)
Anesthesia Evaluation  Patient identified by MRN, date of birth, ID band Patient awake    Reviewed: Allergy & Precautions, NPO status , Patient's Chart, lab work & pertinent test results  Airway Mallampati: II  TM Distance: >3 FB Neck ROM: Full    Dental  (+) Poor Dentition, Dental Advisory Given,    Pulmonary asthma , COPD, former smoker,    breath sounds clear to auscultation       Cardiovascular hypertension, Pt. on medications  Rhythm:Regular Rate:Normal     Neuro/Psych    GI/Hepatic GERD  Medicated and Controlled,(+)     substance abuse  marijuana use,   Endo/Other  diabetes, Poorly Controlled, Type 2, Insulin Dependent, Oral Hypoglycemic AgentsObesity BMI 33 Last a1c 9.7  Renal/GU      Musculoskeletal  (+) Arthritis , Osteoarthritis,  L foot osteo    Abdominal (+) + obese,   Peds  Hematology  (+) Blood dyscrasia, anemia , hct 32.3   Anesthesia Other Findings   Reproductive/Obstetrics                             Anesthesia Physical Anesthesia Plan  ASA: III  Anesthesia Plan: General and Regional   Post-op Pain Management: GA combined w/ Regional for post-op pain   Induction: Intravenous  PONV Risk Score and Plan: 2 and Ondansetron, Dexamethasone and Treatment may vary due to age or medical condition  Airway Management Planned: LMA  Additional Equipment: None  Intra-op Plan:   Post-operative Plan: Extubation in OR  Informed Consent: I have reviewed the patients History and Physical, chart, labs and discussed the procedure including the risks, benefits and alternatives for the proposed anesthesia with the patient or authorized representative who has indicated his/her understanding and acceptance.     Dental advisory given  Plan Discussed with: CRNA  Anesthesia Plan Comments:         Anesthesia Quick Evaluation

## 2019-12-13 NOTE — Transfer of Care (Signed)
Immediate Anesthesia Transfer of Care Note  Patient: [name]  Procedure(s) Performed: LEFT BELOW KNEE AMPUTATION (Left Knee)  Patient Location: PACU  Anesthesia Type:GA combined with regional for post-op pain  Level of Consciousness: drowsy  Airway & Oxygen Therapy: Patient Spontanous Breathing and Patient connected to nasal cannula oxygen  Post-op Assessment: Report given to RN and Post -op Vital signs reviewed and stable  Post vital signs: Reviewed and stable  Last Vitals:  Vitals Value Taken Time  BP 95/64 12/13/19 1755  Temp 36.6 C 12/13/19 1755  Pulse 84 12/13/19 1755  Resp 16 12/13/19 1755  SpO2 97 % 12/13/19 1755  Vitals shown include unvalidated device data.  Last Pain:  Vitals:   12/13/19 1755  TempSrc:   PainSc: Asleep      Patients Stated Pain Goal: 2 (12/13/19 1437)  Complications: No complications documented.

## 2019-12-13 NOTE — Anesthesia Procedure Notes (Signed)
Procedure Name: Intubation Performed by: Ezekiel Ina, CRNA Pre-anesthesia Checklist: Patient identified, Emergency Drugs available, Suction available and Patient being monitored Patient Re-evaluated:Patient Re-evaluated prior to induction Oxygen Delivery Method: Circle System Utilized Preoxygenation: Pre-oxygenation with 100% oxygen Induction Type: IV induction Ventilation: Mask ventilation with difficulty Laryngoscope Size: Miller and 3 Grade View: Grade I Tube type: Oral Tube size: 7.5 mm Number of attempts: 1 Airway Equipment and Method: Stylet and Oral airway Placement Confirmation: ETT inserted through vocal cords under direct vision,  positive ETCO2 and breath sounds checked- equal and bilateral Secured at: 23 cm Tube secured with: Tape Dental Injury: Teeth and Oropharynx as per pre-operative assessment

## 2019-12-14 ENCOUNTER — Telehealth: Payer: Self-pay

## 2019-12-14 ENCOUNTER — Other Ambulatory Visit: Payer: Self-pay | Admitting: Physician Assistant

## 2019-12-14 ENCOUNTER — Encounter (HOSPITAL_COMMUNITY): Payer: Self-pay | Admitting: Orthopedic Surgery

## 2019-12-14 DIAGNOSIS — M86272 Subacute osteomyelitis, left ankle and foot: Secondary | ICD-10-CM | POA: Diagnosis not present

## 2019-12-14 LAB — GLUCOSE, CAPILLARY
Glucose-Capillary: 371 mg/dL — ABNORMAL HIGH (ref 70–99)
Glucose-Capillary: 293 mg/dL — ABNORMAL HIGH (ref 70–99)
Glucose-Capillary: 336 mg/dL — ABNORMAL HIGH (ref 70–99)

## 2019-12-14 MED ORDER — OXYCODONE HCL 5 MG PO TABS: 5.0000 mg | ORAL_TABLET | ORAL | 0 refills | Status: DC | PRN

## 2019-12-14 MED ORDER — METHOCARBAMOL 500 MG PO TABS: 500.0000 mg | ORAL_TABLET | Freq: Four times a day (QID) | ORAL | 0 refills | Status: DC | PRN

## 2019-12-14 NOTE — TOC Transition Note (Signed)
Transition of Care [facility]) - CM/SW Discharge Note   Patient Details  Name: [name] MRN: [id number] Date of Birth: [birth year]  [facility] of [facility] CM/SW Contact:  Curlene Labrum, RN Phone Number: 12/14/2019, 4:09 PM   Clinical Narrative:    Case management met with the patient and the patient was unable to get transportation home by the V mobile van.  The patient was arranged ambulance ride to home and is going to have family come and pick up his wheelchair to deliver to home today.  I explained to the patient that his discharge instructions for medications would be given by the nurse but that no new medications were being prescribed other than an added Robaxin for muscle spasms post surgery.  The patient is aware and is calling family to arrange to have his wheelchair picked up since the ambulance is unable to transport the wheelchair home with the patient.  PTAr was called for transport home and the patient is aware.   Final next level of care: [place] Barriers to Discharge: Transportation (Waiting on [facility] PT set up and WC from [employer].)   Patient Goals and CMS Choice Patient states their goals for this hospitalization and ongoing recovery are:: Plans to discharge home by Mobile V transport CMS Medicare.gov Compare Post Acute Care list provided to:: Patient Choice offered to / list presented to : Patient  Discharge Placement                       Discharge Plan and Services   Discharge Planning Services: CM Consult Post Acute Care Choice: Home Health, Durable Medical Equipment          DME Arranged: Wheelchair manual DME Agency: [employer] Date DME Agency Contacted: 12/14/19 Time DME Agency Contacted: 1100 Representative spoke with at DME Agency: [place] Determinants of Health ([place]) Interventions     Readmission Risk Interventions No flowsheet data found.

## 2019-12-14 NOTE — Telephone Encounter (Signed)
Please see below.

## 2019-12-14 NOTE — Progress Notes (Signed)
Patient is postop day 1 status post below-knee amputation.  He is anxious to discharge home today.  He does have his wife at home to help him.   Vital signs stable afebrile wound VAC in place alert pleasant to exam.  Wound VAC is functioning well with 0 cc in canister  Patient can be DC'd to home with home health and DME needs such as a wheelchair.

## 2019-12-14 NOTE — Anesthesia Postprocedure Evaluation (Signed)
Anesthesia Post Note  Patient: [name]  Procedure(s) Performed: LEFT BELOW KNEE AMPUTATION (Left Knee)     Patient location during evaluation: PACU Anesthesia Type: Regional and General Level of consciousness: awake and alert Pain management: pain level controlled Vital Signs Assessment: post-procedure vital signs reviewed and stable Respiratory status: spontaneous breathing, nonlabored ventilation and respiratory function stable Cardiovascular status: blood pressure returned to baseline and stable Postop Assessment: no apparent nausea or vomiting Anesthetic complications: no   No complications documented.  Last Vitals:  Vitals:   12/13/19 2107 12/14/19 0355  BP: (!) 145/90 (!) 147/91  Pulse: 91 94  Resp: 20 18  Temp: 36.9 C 36.7 C  SpO2: 99% 98%    Last Pain:  Vitals:   12/14/19 0355  TempSrc: Oral  PainSc:                  Lowella Curb

## 2019-12-14 NOTE — Discharge Summary (Signed)
Discharge Diagnoses:  Active Problems:   Subacute osteomyelitis, left ankle and foot (HCC)   Abscess of left foot   Surgeries: Procedure(s): LEFT BELOW KNEE AMPUTATION on 12/13/2019    Consultants:   Discharged Condition: Improved  Hospital Course: [name] is an 54 y.o. male who was admitted 12/13/2019 with a chief complaint of left foot osteomyelitis, with a final diagnosis of left foot osteomyelitis.  Patient was brought to the operating room on 12/13/2019 and underwent Procedure(s): LEFT BELOW KNEE AMPUTATION.    Patient was given perioperative antibiotics:  Anti-infectives (From admission, onward)   Start     Dose/Rate Route Frequency Ordered Stop   12/13/19 2200  clindamycin (CLEOCIN) IVPB 900 mg        900 mg 100 mL/hr over 30 Minutes Intravenous Every 8 hours 12/13/19 1838 12/14/19 0625   12/13/19 1430  clindamycin (CLEOCIN) IVPB 900 mg        900 mg 100 mL/hr over 30 Minutes Intravenous On call to O.R. 12/13/19 1415 12/13/19 1736   12/13/19 1408  clindamycin (CLEOCIN) 900 MG/50ML IVPB       Note to Pharmacy: Laurita Quint   : cabinet override      12/13/19 1408 12/13/19 1724    .  Patient was given sequential compression devices, early ambulation, and aspirin for DVT prophylaxis.  Recent vital signs:  Patient Vitals for the past 24 hrs:  BP Temp Temp src Pulse Resp SpO2 Height Weight  12/14/19 0355 (!) 147/91 98.1 F (36.7 C) Oral 94 18 98 % - -  12/13/19 2107 (!) 145/90 98.5 F (36.9 C) Oral 91 20 99 % - -  12/13/19 2040 (!) 142/81 97.8 F (36.6 C) - 86 15 97 % - -  12/13/19 2010 139/85 - - 86 15 97 % - -  12/13/19 1940 122/86 - - 87 15 98 % - -  12/13/19 1910 123/81 - - 83 14 99 % - -  12/13/19 1855 (!) 139/91 - - 80 17 - - -  12/13/19 1840 126/85 (!) 97 F (36.1 C) - 82 12 96 % - -  12/13/19 1825 123/82 - - 87 19 95 % - -  12/13/19 1810 95/63 - - 80 15 97 % - -  12/13/19 1755 95/64 97.9 F (36.6 C) - 84 16 97 % - -  12/13/19 1409 (!) 183/89 98 F  (36.7 C) Oral (!) 105 20 97 % 6' (1.829 m) 110.7 kg  .  Recent laboratory studies: No results found.  Discharge Medications:   Allergies as of 12/14/2019      Reactions   Aspirin Other (See Comments)   Patient says he's a free bleeder, tolerates low dose aspirin   Penicillins Hives, Other (See Comments)   Has patient had a PCN reaction causing immediate rash, facial/tongue/throat swelling, SOB or lightheadedness with hypotension: YES Has patient had a PCN reaction causing severe rash involving mucus membranes or skin necrosis: NO Has patient had a PCN reaction that required hospitalization: NO Has patient had a PCN reaction occurring within the last 10 years: NO If all of the above answers are "NO", then may proceed with Cephalosporin use.      Medication List    STOP taking these medications   nitroGLYCERIN 0.2 mg/hr patch Commonly known as: NITRODUR - Dosed in mg/24 hr     TAKE these medications   albuterol 108 (90 Base) MCG/ACT inhaler Commonly known as: VENTOLIN HFA Inhale 2 puffs into the lungs  every 4 (four) hours as needed for wheezing or shortness of breath. Asthma   amitriptyline 25 MG tablet Commonly known as: ELAVIL Take 100 mg by mouth at bedtime.   aspirin EC 81 MG tablet Take 81 mg by mouth daily.   diphenhydrAMINE 25 MG tablet Commonly known as: BENADRYL Take 25 mg by mouth at bedtime as needed for allergies.   doxycycline 100 MG tablet Commonly known as: VIBRA-TABS TAKE 1 TABLET BY MOUTH TWICE DAILY   gabapentin 600 MG tablet Commonly known as: NEURONTIN Take 1,200 mg by mouth 3 (three) times daily.   HumuLIN N 100 UNIT/ML injection Generic drug: insulin NPH Human Inject 10-15 Units into the skin at bedtime.   losartan 50 MG tablet Commonly known as: COZAAR Take 50 mg by mouth daily.   metFORMIN 1000 MG tablet Commonly known as: GLUCOPHAGE Take 1,000 mg by mouth 2 (two) times daily with a meal.   methocarbamol 500 MG tablet Commonly known  as: ROBAXIN Take 1 tablet (500 mg total) by mouth every 6 (six) hours as needed for muscle spasms.   multivitamin with minerals Tabs tablet Take 1 tablet by mouth daily.   Narcan 4 MG/0.1ML Liqd nasal spray kit Generic drug: naloxone Place 1 spray into the nose as needed (opioid overdose).   omeprazole 20 MG capsule Commonly known as: PRILOSEC Take 20 mg by mouth 2 (two) times daily.   Oxycodone HCl 10 MG Tabs Take 10 mg by mouth every 8 (eight) hours as needed (pain). What changed: Another medication with the same name was added. Make sure you understand how and when to take each.   oxyCODONE 5 MG immediate release tablet Commonly known as: Oxy IR/ROXICODONE Take 1-2 tablets (5-10 mg total) by mouth every 4 (four) hours as needed for moderate pain (pain score 4-6). What changed: You were already taking a medication with the same name, and this prescription was added. Make sure you understand how and when to take each.   pravastatin 20 MG tablet Commonly known as: PRAVACHOL Take 20 mg by mouth daily.   Vitamin D (Ergocalciferol) 1.25 MG (50000 UNIT) Caps capsule Commonly known as: DRISDOL Take 50,000 Units by mouth every Sunday.   Xtampza ER 13.5 MG C12a Generic drug: oxyCODONE ER Take 1 capsule by mouth 2 (two) times daily.       Diagnostic Studies: No results found.  Patient benefited maximally from their hospital stay and there were no complications.     Disposition:  Discharge Instructions    Negative Pressure Wound Therapy - Incisional   Complete by: As directed    Show patient how to attach [place], [place], NP In 1 week.   Specialty: Orthopedic Surgery Contact information: [address] [place] [address] [phone]                Signed: Bevely Palmer Lamica Mccart 12/14/2019, 7:55 AM

## 2019-12-14 NOTE — Evaluation (Signed)
Physical Therapy Evaluation Patient Details Name: [name] MRN: [id number] DOB: [birth year] Today's Date: 12/14/2019   History of Present Illness  Patient is a 54 y/o male admitted with osteomyelitis L foot for L BKA.  PMH positive for COPD, GERD, Gout, MI, HTN, DM, nephropathy, pancreatitis.  Clinical Impression  Patient presents with decreased mobility due to new L BKA with decreased balance, pain, decreased knowledge of preautions and DME and he will benefit from follow up HHPT at d/c.  Patient eager for home today.  Stable from mobility standpoint as he has family to assist with one level home and ramped entry.  Will need w/c for home.    Follow Up Recommendations Home health PT    Equipment Recommendations  Wheelchair (measurements PT);Wheelchair cushion (measurements PT)    Recommendations for Other Services       Precautions / Restrictions Precautions Required Braces or Orthoses: Other Brace Other Brace: looks like has brace ordered for L LE Restrictions Weight Bearing Restrictions: Yes LLE Weight Bearing: Non weight bearing      Mobility  Bed Mobility Overal bed mobility: Modified Independent             General bed mobility comments: HOB slightly elevated, UE use to boost up  Transfers Overall transfer level: Needs assistance Equipment used: Rolling walker (2 wheeled) Transfers: Sit to/from Stand Sit to Stand: Min assist         General transfer comment: assist for balance  Ambulation/Gait Ambulation/Gait assistance: Min guard Gait Distance (Feet): 22 Feet Assistive device: Rolling walker (2 wheeled) Gait Pattern/deviations: Step-to pattern     General Gait Details: walker adjusted for height and pt able to balance and get to door and back to chair. assist for safety  Stairs            Wheelchair Mobility    Modified Rankin (Stroke Patients Only)       Balance Overall balance assessment: Needs assistance   Sitting balance-Leahy  Scale: Good     Standing balance support: Bilateral upper extremity supported Standing balance-Leahy Scale: Poor Standing balance comment: UE support due to NWB on L BKA                             Pertinent Vitals/Pain Pain Assessment: 0-10 Pain Score: 9  Pain Location: L leg Pain Descriptors / Indicators: Operative site guarding Pain Intervention(s): Monitored during session;Premedicated before session    Home Living Family/patient expects to be discharged to:: Private residence Living Arrangements: Spouse/significant other;Other relatives Available Help at Discharge: Family;Available 24 hours/day Type of Home: House Home Access: Ramped entrance     Home Layout: One level Home Equipment: Walker - 2 wheels;Cane - single point;Bedside commode;Shower seat;Grab bars - tub/shower;Hand held shower head      Prior Function Level of Independence: Independent with assistive device(s)         Comments: used cane PTA     Hand Dominance   Dominant Hand: Right    Extremity/Trunk Assessment   Upper Extremity Assessment Upper Extremity Assessment: Overall WFL for tasks assessed    Lower Extremity Assessment Lower Extremity Assessment: LLE deficits/detail;RLE deficits/detail RLE Deficits / Details: generally WFL, but noted ulcer under met heads oozing onto sock, reports planned intervention for at least 5th toe LLE Deficits / Details: L BKA with wound vac, noted AROM WFL, strength not formally tested, at least 3/5       Communication   Communication:  No difficulties  Cognition Arousal/Alertness: Awake/alert Behavior During Therapy: WFL for tasks assessed/performed Overall Cognitive Status: Within Functional Limits for tasks assessed                                        General Comments General comments (skin integrity, edema, etc.): discussed need for leg extended at rest and likely brace MD ordered will help with this; noted R foot  plantar surface on MT heads under II-IV ulceration with drainage on sock, reports MD to surgically treat when L leg healed    Exercises Other Exercises Other Exercises: demonstrated standing knee flexion, hip extension and abduction   Assessment/Plan    PT Assessment All further PT needs can be met in the next venue of care  PT Problem List         PT Treatment Interventions      PT Goals (Current goals can be found in the Care Plan section)  Acute Rehab PT Goals Patient Stated Goal: to go home today PT Goal Formulation: All assessment and education complete, DC therapy    Frequency     Barriers to discharge        Co-evaluation               AM-PAC PT "6 Clicks" Mobility  Outcome Measure Help needed turning from your back to your side while in a flat bed without using bedrails?: None Help needed moving from lying on your back to sitting on the side of a flat bed without using bedrails?: None Help needed moving to and from a bed to a chair (including a wheelchair)?: A Little Help needed standing up from a chair using your arms (e.g., wheelchair or bedside chair)?: A Little Help needed to walk in hospital room?: A Little Help needed climbing 3-5 steps with a railing? : A Little 6 Click Score: 20    End of Session   Activity Tolerance: Patient tolerated treatment well Patient left: in chair;with call bell/phone within reach   PT Visit Diagnosis: Difficulty in walking, not elsewhere classified (R26.2);Pain Pain - Right/Left: Left Pain - part of body: Leg    Time: 5521-7471 PT Time Calculation (min) (ACUTE ONLY): 25 min   Charges:   PT Evaluation $PT Eval Moderate Complexity: 1 Mod PT Treatments $Gait Training: 8-22 mins        Magda Kiel, PT Acute Rehabilitation Services [phone] [phone] 12/14/2019   Reginia Naas 12/14/2019, 12:38 PM

## 2019-12-14 NOTE — Progress Notes (Signed)
Orthopedic Tech Progress Note Patient Details:  [name] 04/06/1966 [id number] Called in brace. Patient ID: [name], male   DOB: 07-12-1965, 54 y.o.   MRN: [id number]   Michelle Piper 12/14/2019, 7:58 AM

## 2019-12-14 NOTE — Telephone Encounter (Signed)
Greig Castilla, pharmacist at [facility] wanted to let [place] know that a Rx for Oxycodone was filled on Wednesday, 12/13/2019.  Would like a call back concerning Rx for Oxycodone 5mg  that was prescribed today.  Cb# [phone].  Please advise.  Thank you.

## 2019-12-14 NOTE — Care Management (Cosign Needed)
    Durable Medical Equipment  (From admission, onward)         Start     Ordered   12/14/19 1250  For home use only DME lightweight manual wheelchair with seat cushion  Once       Comments: Patient suffers from left BKA which impairs their ability to perform daily activities like ADLs:20651 in the home.  A walking QBV:69450 will not resolve  issue with performing activities of daily living. A wheelchair will allow patient to safely perform daily activities. Patient is not able to propel themselves in the home using a standard weight wheelchair due to weakness:20653. Patient can self propel in the lightweight wheelchair. Length of need lifetime. Accessories: elevating leg rests (ELRs), wheel locks, extensions and anti-tippers.  Needs Left leg rest and amputee pad   12/14/19 1253

## 2019-12-14 NOTE — Plan of Care (Signed)

## 2019-12-14 NOTE — Telephone Encounter (Signed)
Pharmacy just filled rx for oxycodone. Discontined rx

## 2019-12-14 NOTE — Discharge Summary (Signed)
Discharge Diagnoses:  Active Problems:   Subacute osteomyelitis, left ankle and foot (HCC)   Abscess of left foot   Surgeries: Procedure(s): LEFT BELOW KNEE AMPUTATION on 12/13/2019    Consultants:   Discharged Condition: Improved  Hospital Course: [name] is an 54 y.o. male who was admitted 12/13/2019 with a chief complaint of left foot osteomyelitis, with a final diagnosis of left foot osteomyelitis.  Patient was brought to the operating room on 12/13/2019 and underwent Procedure(s): LEFT BELOW KNEE AMPUTATION.    Patient was given perioperative antibiotics:  Anti-infectives (From admission, onward)   Start     Dose/Rate Route Frequency Ordered Stop   12/13/19 2200  clindamycin (CLEOCIN) IVPB 900 mg        900 mg 100 mL/hr over 30 Minutes Intravenous Every 8 hours 12/13/19 1838 12/14/19 0625   12/13/19 1430  clindamycin (CLEOCIN) IVPB 900 mg        900 mg 100 mL/hr over 30 Minutes Intravenous On call to O.R. 12/13/19 1415 12/13/19 1736   12/13/19 1408  clindamycin (CLEOCIN) 900 MG/50ML IVPB       Note to Pharmacy: Laurita Quint   : cabinet override      12/13/19 1408 12/13/19 1724    .  Patient was given sequential compression devices, early ambulation, and aspirin for DVT prophylaxis.  Recent vital signs:  Patient Vitals for the past 24 hrs:  BP Temp Temp src Pulse Resp SpO2  12/14/19 0812 126/78 98.6 F (37 C) -- 95 18 94 %  12/14/19 0355 (!) 147/91 98.1 F (36.7 C) Oral 94 18 98 %  12/13/19 2107 (!) 145/90 98.5 F (36.9 C) Oral 91 20 99 %  12/13/19 2040 (!) 142/81 97.8 F (36.6 C) -- 86 15 97 %  12/13/19 2010 139/85 -- -- 86 15 97 %  12/13/19 1940 122/86 -- -- 87 15 98 %  12/13/19 1910 123/81 -- -- 83 14 99 %  12/13/19 1855 (!) 139/91 -- -- 80 17 --  12/13/19 1840 126/85 (!) 97 F (36.1 C) -- 82 12 96 %  12/13/19 1825 123/82 -- -- 87 19 95 %  12/13/19 1810 95/63 -- -- 80 15 97 %  12/13/19 1755 95/64 97.9 F (36.6 C) -- 84 16 97 %  .  Recent  laboratory studies: No results found.  Discharge Medications:   Allergies as of 12/14/2019      Reactions   Aspirin Other (See Comments)   Patient says he's a free bleeder, tolerates low dose aspirin   Penicillins Hives, Other (See Comments)   Has patient had a PCN reaction causing immediate rash, facial/tongue/throat swelling, SOB or lightheadedness with hypotension: YES Has patient had a PCN reaction causing severe rash involving mucus membranes or skin necrosis: NO Has patient had a PCN reaction that required hospitalization: NO Has patient had a PCN reaction occurring within the last 10 years: NO If all of the above answers are "NO", then may proceed with Cephalosporin use.      Medication List    STOP taking these medications   nitroGLYCERIN 0.2 mg/hr patch Commonly known as: NITRODUR - Dosed in mg/24 hr     TAKE these medications   albuterol 108 (90 Base) MCG/ACT inhaler Commonly known as: VENTOLIN HFA Inhale 2 puffs into the lungs every 4 (four) hours as needed for wheezing or shortness of breath. Asthma   amitriptyline 25 MG tablet Commonly known as: ELAVIL Take 100 mg by mouth at bedtime.  aspirin EC 81 MG tablet Take 81 mg by mouth daily.   diphenhydrAMINE 25 MG tablet Commonly known as: BENADRYL Take 25 mg by mouth at bedtime as needed for allergies.   doxycycline 100 MG tablet Commonly known as: VIBRA-TABS TAKE 1 TABLET BY MOUTH TWICE DAILY   gabapentin 600 MG tablet Commonly known as: NEURONTIN Take 1,200 mg by mouth 3 (three) times daily.   HumuLIN N 100 UNIT/ML injection Generic drug: insulin NPH Human Inject 10-15 Units into the skin at bedtime.   losartan 50 MG tablet Commonly known as: COZAAR Take 50 mg by mouth daily.   metFORMIN 1000 MG tablet Commonly known as: GLUCOPHAGE Take 1,000 mg by mouth 2 (two) times daily with a meal.   methocarbamol 500 MG tablet Commonly known as: ROBAXIN Take 1 tablet (500 mg total) by mouth every 6 (six)  hours as needed for muscle spasms.   multivitamin with minerals Tabs tablet Take 1 tablet by mouth daily.   Narcan 4 MG/0.1ML Liqd nasal spray kit Generic drug: naloxone Place 1 spray into the nose as needed (opioid overdose).   omeprazole 20 MG capsule Commonly known as: PRILOSEC Take 20 mg by mouth 2 (two) times daily.   Oxycodone HCl 10 MG Tabs Take 10 mg by mouth every 8 (eight) hours as needed (pain). What changed: Another medication with the same name was added. Make sure you understand how and when to take each.   oxyCODONE 5 MG immediate release tablet Commonly known as: Oxy IR/ROXICODONE Take 1-2 tablets (5-10 mg total) by mouth every 4 (four) hours as needed for moderate pain (pain score 4-6). What changed: You were already taking a medication with the same name, and this prescription was added. Make sure you understand how and when to take each.   pravastatin 20 MG tablet Commonly known as: PRAVACHOL Take 20 mg by mouth daily.   Vitamin D (Ergocalciferol) 1.25 MG (50000 UNIT) Caps capsule Commonly known as: DRISDOL Take 50,000 Units by mouth every Sunday.   Xtampza ER 13.5 MG C12a Generic drug: oxyCODONE ER Take 1 capsule by mouth 2 (two) times daily.            Durable Medical Equipment  (From admission, onward)         Start     Ordered   12/14/19 1250  For home use only DME lightweight manual wheelchair with seat cushion  Once       Comments: Patient suffers from left BKA which impairs their ability to perform daily activities like KKXF:81829 in the home.  A walking HBZ:16967 will not resolve  issue with performing activities of daily living. A wheelchair will allow patient to safely perform daily activities. Patient is not able to propel themselves in the home using a standard weight wheelchair due to weakness:20653. Patient can self propel in the lightweight wheelchair. Length of need lifetime. Accessories: elevating leg rests (ELRs), wheel locks,  extensions and anti-tippers.  Needs Left leg rest and amputee pad   12/14/19 1253          Diagnostic Studies: No results found.  Patient benefited maximally from their hospital stay and there were no complications.     Disposition: Discharge disposition: 01-Home or Self Care      Discharge Instructions    Call MD / Call 911   Complete by: As directed    If you experience chest pain or shortness of breath, CALL 911 and be transported to the hospital emergency room.  If you develope a fever above 101 F, pus (white drainage) or increased drainage or redness at the wound, or calf pain, call your surgeon's office.   Constipation Prevention   Complete by: As directed    Drink plenty of fluids.  Prune juice may be helpful.  You may use a stool softener, such as Colace (over the counter) 100 mg twice a day.  Use MiraLax (over the counter) for constipation as needed.   Diet - low sodium heart healthy   Complete by: As directed    Discharge instructions   Complete by: As directed    Elevate leg. If prevena vac beeps please contact office   Increase activity slowly as tolerated   Complete by: As directed    Negative Pressure Wound Therapy - Incisional   Complete by: As directed    Show patient how to attach Pattison, Erin R, NP In 1 week.   Specialty: Orthopedic Surgery Contact information: [address] [place] [address] [place], [place] [facility] Follow up.   Why: [employer] will be providing a wheelchair to your hospital room for discharge home. Contact information: [address] [place] [address] [phone]                Signed: Bevely Palmer Percell Lamboy 12/14/2019, 3:11 PM

## 2019-12-14 NOTE — Progress Notes (Signed)
Provided discharge education/instructions, all questions and concerns addressed, Pt not in distress. Switched to prevena wound vac, no error noted, dressing is clean, dry and intact, stump protector in place, wheelchair labeled. Spoke to Pt's wife who agreed to pick up wheelchair since Pt is discharging via [facility].

## 2019-12-14 NOTE — Evaluation (Signed)
Occupational Therapy Evaluation Patient Details Name: [name] MRN: [id number] DOB: [birth year] Today's Date: 12/14/2019    History of Present Illness Patient is a 54 y/o male admitted with osteomyelitis L foot for L BKA.  PMH positive for COPD, GERD, Gout, MI, HTN, DM, nephropathy, pancreatitis.   Clinical Impression   Pt admitted with the above diagnoses and presents with below problem list. Pt will benefit from continued acute OT to address the below listed deficits and maximize independence with basic ADLs prior to d/c home. PTA pt was mod I with ADLs. Pt is currently setup with UB ADLs, min A with LB ADLs and functional transfers.      Follow Up Recommendations  Home health OT;Supervision/Assistance - 24 hour (initial 24 hr)    Equipment Recommendations  Wheelchair (measurements OT);Wheelchair cushion (measurements OT)    Recommendations for Other Services       Precautions / Restrictions Precautions Required Braces or Orthoses: Other Brace Other Brace: looks like has brace ordered for L LE Restrictions Weight Bearing Restrictions: Yes LLE Weight Bearing: Non weight bearing      Mobility Bed Mobility Overal bed mobility: Modified Independent             General bed mobility comments: up in recliner  Transfers Overall transfer level: Needs assistance Equipment used: Rolling walker (2 wheeled) Transfers: Sit to/from Stand Sit to Stand: Min assist         General transfer comment: assist for balance    Balance Overall balance assessment: Needs assistance   Sitting balance-Leahy Scale: Good     Standing balance support: Bilateral upper extremity supported Standing balance-Leahy Scale: Poor Standing balance comment: UE support due to NWB on L BKA                           ADL either performed or assessed with clinical judgement   ADL Overall ADL's : Needs assistance/impaired Eating/Feeding: Set up;Sitting   Grooming: Set  up;Sitting   Upper Body Bathing: Set up;Sitting   Lower Body Bathing: Minimal assistance;Sitting/lateral leans;Sit to/from stand   Upper Body Dressing : Set up;Sitting   Lower Body Dressing: Minimal assistance;Sitting/lateral leans;Sit to/from stand   Toilet Transfer: Minimal assistance;Ambulation;RW   Toileting- Clothing Manipulation and Hygiene: Minimal assistance;Sitting/lateral lean;Sit to/from stand       Functional mobility during ADLs: Min guard;Rolling walker General ADL Comments: discussed precautions and safety with ADLs      Vision         Perception     Praxis      Pertinent Vitals/Pain Pain Assessment: 0-10 Pain Score: 9  Pain Location: L leg Pain Descriptors / Indicators: Sore Pain Intervention(s): Monitored during session     Hand Dominance Right   Extremity/Trunk Assessment Upper Extremity Assessment Upper Extremity Assessment: Overall WFL for tasks assessed   Lower Extremity Assessment Lower Extremity Assessment: Defer to PT evaluation RLE Deficits / Details: generally WFL, but noted ulcer under met heads oozing onto sock, reports planned intervention for at least 5th toe LLE Deficits / Details: L BKA with wound vac, noted AROM WFL, strength not formally tested, at least 3/5       Communication Communication Communication: No difficulties   Cognition Arousal/Alertness: Awake/alert Behavior During Therapy: WFL for tasks assessed/performed Overall Cognitive Status: Within Functional Limits for tasks assessed  General Comments  discussed need for leg extended at rest and likely brace MD ordered will help with this; noted R foot plantar surface on MT heads under II-IV ulceration with drainage on sock, reports MD to surgically treat when L leg healed    Exercises Other Exercises Other Exercises: demonstrated standing knee flexion, hip extension and abduction   Shoulder Instructions       Home Living Family/patient expects to be discharged to:: Private residence Living Arrangements: Spouse/significant other;Other relatives Available Help at Discharge: Family;Available 24 hours/day Type of Home: House Home Access: Ramped entrance     Home Layout: One level     Bathroom Shower/Tub: Walk-in shower         Home Equipment: Environmental consultant - 2 wheels;Cane - single point;Bedside commode;Shower seat;Grab bars - tub/shower;Hand held shower head          Prior Functioning/Environment Level of Independence: Independent with assistive device(s)        Comments: used cane PTA        OT Problem List: Impaired balance (sitting and/or standing);Decreased knowledge of use of DME or AE;Decreased knowledge of precautions;Pain      OT Treatment/Interventions: Self-care/ADL training;DME and/or AE instruction;Therapeutic exercise;Therapeutic activities;Patient/family education;Balance training    OT Goals(Current goals can be found in the care plan section) Acute Rehab OT Goals Patient Stated Goal: to go home today OT Goal Formulation: With patient Time For Goal Achievement: 12/28/19  OT Frequency: Min 2X/week   Barriers to D/C:            Co-evaluation              AM-PAC OT "6 Clicks" Daily Activity     Outcome Measure Help from another person eating meals?: None Help from another person taking care of personal grooming?: None Help from another person toileting, which includes using toliet, bedpan, or urinal?: A Little Help from another person bathing (including washing, rinsing, drying)?: A Little Help from another person to put on and taking off regular upper body clothing?: None Help from another person to put on and taking off regular lower body clothing?: A Little 6 Click Score: 21   End of Session Equipment Utilized During Treatment: Rolling walker  Activity Tolerance: Patient limited by pain;Patient tolerated treatment well Patient left: in chair;with  call bell/phone within reach  OT Visit Diagnosis: Unsteadiness on feet (R26.81);Pain;Muscle weakness (generalized) (M62.81)                Time: 1007-1020 OT Time Calculation (min): 13 min Charges:  OT General Charges $OT Visit: 1 Visit OT Evaluation $OT Eval Low Complexity: [place], OT Acute Rehabilitation Services Pager: [phone] Office: [phone]   Hortencia Pilar 12/14/2019, 1:07 PM

## 2019-12-14 NOTE — TOC Initial Note (Addendum)
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  The patient lives with his wife at home in [place] - he has an existing ramp at the home.  The patient will need a wheelchair, amputee rest and Left leg elevation.  I called [facility] and they are providing a wheelchair to the room.  The patient is anxious to discharge home today - spoke with the patient about [facility] services and patient is agreeable - [place] services may be a challenge due to the limited [facility] companies in the area - I will continue to reach out to [facility] providers to find PT services if available and will reach out to the physician if this becomes a barrier.  I called [facility] - unable to provide due to staffing issues in the area.  Waiting to hear back from [facility] and [facility] - left a message with both.  Patient will be taking V mobile home that will be arranged by the patient once his wheelchair is delivered and wound vac changed to prevena vac.  I called [facility], [facility], [facility] - unable to provide services due to staffing.  The patient will be obtaining his discharge medications from the listed pharmacy from home.  The patient is aware about his medications and inability to provide him with home health services.  Will continue to follow the patient for discharge to home.  8/26 1515 - [facility], [facility] and [facility] were all unable to provide [facility] staffing for PT.  Dr. Sharol Given is aware.  Patient is calling family to provide his transportation home via [facility] Mobile transport.  Expected Discharge Plan: [place] Barriers to Discharge: Transportation  (Waiting on [facility] PT set up and WC from [facility].)   Patient Goals and CMS Choice Patient states their goals for this hospitalization and ongoing recovery are:: Plans to discharge home by Mobile V transport CMS Medicare.gov Compare Post Acute Care list provided to:: Patient Choice offered to / list presented to : Patient  Expected Discharge Plan and Services Expected Discharge Plan: [place]   Discharge Planning Services: CM Consult Post Acute Care Choice: Home Health, Durable Medical Equipment Living arrangements for the past 2 months: Single Family Home                 DME Arranged: Wheelchair manual DME Agency: [facility] Date DME Agency Contacted: 12/14/19 Time DME Agency Contacted: 1100 Representative spoke with at DME Agency: [place]            Prior Living Arrangements/Services Living arrangements for the past 2 months: [place] Lives with:: Spouse Patient language and need for interpreter reviewed:: Yes Do you feel safe going back to the place where you live?: Yes      Need for Family Participation in Patient Care: Yes (Comment) Care giver support system in place?: Yes (comment)   Criminal Activity/Legal Involvement Pertinent to Current Situation/Hospitalization: No - Comment as needed  Activities of Daily Living Home Assistive Devices/Equipment: Eyeglasses, Kasandra Knudsen (specify quad or  straight), CBG Meter ADL Screening (condition at time of admission) Patient's cognitive ability adequate to safely complete daily activities?: Yes Is the patient deaf or have difficulty hearing?: No Does the patient have difficulty seeing, even when wearing glasses/contacts?: No Does the patient have difficulty concentrating, remembering, or making decisions?: No Patient able to express need for assistance with ADLs?: Yes Does the patient have difficulty dressing or bathing?: No Independently performs ADLs?: Yes (appropriate for developmental age) Does the  patient have difficulty walking or climbing stairs?: Yes Weakness of Legs: Left Weakness of Arms/Hands: None  Permission Sought/Granted Permission sought to share information with : Case Manager Permission granted to share information with : Yes, Verbal Permission Granted        Permission granted to share info w Relationship: wife - Hassan Rowan     Emotional Assessment Appearance:: Appears stated age Attitude/Demeanor/Rapport: Gracious Affect (typically observed): Accepting Orientation: : Oriented to Self, Oriented to Place, Oriented to  Time, Oriented to Situation Alcohol / Substance Use: Not Applicable Psych Involvement: No (comment)  Admission diagnosis:  Abscess of left foot [L02.612] Patient Active Problem List   Diagnosis Date Noted  . Abscess of left foot 12/13/2019  . Diabetes mellitus type II, non insulin dependent ([place]) 07/09/2017  . Subacute osteomyelitis, left ankle and foot ([place]) 07/09/2017  . Hypokalemia 07/09/2017  . Hyponatremia 07/09/2017  . AKI (acute kidney injury) ([place]) 07/09/2017  . Symptomatic anemia 07/09/2017  . Essential hypertension 07/09/2017  . Depression with anxiety 07/09/2017  . Venous thrombosis 07/09/2017   PCP:  Abran Richard, MD Pharmacy:   [place], [place] - [address] [place] [address] Phone: [phone] Fax: [phone]     Social Determinants of Health (SDOH) Interventions    Readmission Risk Interventions No flowsheet data found.

## 2019-12-18 LAB — SURGICAL PATHOLOGY

## 2019-12-19 ENCOUNTER — Telehealth: Payer: Self-pay

## 2019-12-19 ENCOUNTER — Telehealth: Payer: Self-pay | Admitting: Orthopedic Surgery

## 2019-12-19 NOTE — Telephone Encounter (Signed)
Sent an urgent order to [facility] to go to his home and remove wound vac ASAP [name] is who I spoke with and states she will do the best she can to get someone out there ASAP

## 2019-12-19 NOTE — Telephone Encounter (Signed)
[name] is s/p BKA last Wednesday 12/13/19.  His wife [name] is concerned because she has not heard from any home health nursing and no one has been out to change [name]'s dressing or see if his wound is healing properly.  Please call to discuss [phone].

## 2019-12-19 NOTE — Telephone Encounter (Signed)
Patient called  Patient's wife had surgery and needs a post op appointment and needs home care   Call back: [phone]

## 2019-12-20 ENCOUNTER — Telehealth: Payer: Self-pay

## 2019-12-20 NOTE — Telephone Encounter (Signed)
Will hold and discuss with pt at his appt Friday. Had already discussed with pt's wife that there is not a skilled need for the pt right now as he is wearing a wound vac. If orders change at the time of his appt we can try and refer to another agency

## 2019-12-20 NOTE — Telephone Encounter (Signed)
I called and pt has an appt on Friday 12/22/19 he still has his wound vac on so no need for skilled nursing at this time. Will discuss at office visit this week.

## 2019-12-20 NOTE — Telephone Encounter (Signed)
[facility] called in to notify that patient is not in service with them they received a ref from [place]

## 2019-12-22 ENCOUNTER — Ambulatory Visit (INDEPENDENT_AMBULATORY_CARE_PROVIDER_SITE_OTHER): Payer: Medicaid Other | Admitting: Physician Assistant

## 2019-12-22 ENCOUNTER — Encounter: Payer: Self-pay | Admitting: Physician Assistant

## 2019-12-22 VITALS — Ht 72.0 in | Wt 244.0 lb

## 2019-12-22 DIAGNOSIS — I96 Gangrene, not elsewhere classified: Secondary | ICD-10-CM

## 2019-12-22 NOTE — Progress Notes (Signed)
Office Visit Note   Patient: [name]           Date of Birth: 02-25-1966           MRN: [id number] Visit Date: 12/22/2019              Requested by: Alvina Filbert, MD [address] [place],  [place] [address] PCP: Alvina Filbert, MD  Chief Complaint  Patient presents with  . Left Leg - Routine Post Op    12/13/19 left BKA       HPI: This is a pleasant gentleman who is 1 week status post left below-knee amputation.  He is feeling well without complaints.  His only concern is that he did not get enough pain medication when he was discharged from the hospital  Assessment & Plan: Visit Diagnoses: No diagnosis found.  Plan: I spoke with the patient and discussed that he had a pain contract which was why we could not fill his prescription nor with the pharmacy.  He will on the stump side do daily cleansing with antibacterial soap and water and apply his shrinker a clean 1 daily.  On the right side we have given him an offloading doughnut for the ulcer and he will remain in the postop shoe.  Follow-Up Instructions: No follow-ups on file.   Ortho Exam  Patient is alert, oriented, no adenopathy, well-dressed, normal affect, normal respiratory effort. Left below-knee amputation stump well approximated wound edges mild to moderate soft tissue swelling bloody drainage.  Staples are in place.  No cellulitis or foul odors  Right foot he does have a ulcer on the plantar forefoot.  After obtaining verbal consent this was debrided to healthy surfaces.  He has good vascular tissue in the central portion of this it measures about 4 x 4 cm.  There is no surrounding cellulitis it does not probe deeply  Imaging: No results found. No images are attached to the encounter.  Labs: Lab Results  Component Value Date   HGBA1C 11.4 (H) 12/13/2019   HGBA1C 9.7 (H) 11/09/2018   REPTSTATUS 07/14/2017 FINAL 07/09/2017   REPTSTATUS 07/14/2017 FINAL 07/09/2017   CULT  07/09/2017    NO GROWTH 5  DAYS Performed at [facility], [address] [place], [place] [address]    CULT  07/09/2017    NO GROWTH 5 DAYS Performed at [facility], [address] [place], [place] [address]      Lab Results  Component Value Date   ALBUMIN 2.9 (L) 12/13/2019   ALBUMIN 2.5 (L) 08/31/2019   ALBUMIN 3.8 05/03/2019    Lab Results  Component Value Date   MG 1.4 (L) 07/11/2017   No results found for: VD25OH  No results found for: PREALBUMIN CBC EXTENDED Latest Ref Rng & Units 12/13/2019 08/31/2019 05/03/2019  WBC 4.0 - 10.5 K/uL 7.5 8.0 10.1  RBC 4.22 - 5.81 MIL/uL 3.66(L) 3.71(L) 4.09(L)  HGB 13.0 - 17.0 g/dL 10.1(L) 10.6(L) 12.9(L)  HCT 39 - 52 % 32.3(L) 33.8(L) 38.1(L)  PLT 150 - 400 K/uL 251 312 175  NEUTROABS 1.7 - 7.7 K/uL - 5.9 -  LYMPHSABS 0.7 - 4.0 K/uL - 1.4 -     Body mass index is 33.09 kg/m.  Orders:  No orders of the defined types were placed in this encounter.  No orders of the defined types were placed in this encounter.    Procedures: No procedures performed  Clinical Data: No additional findings.  ROS:  All  other systems negative, except as noted in the HPI. Review of Systems  Objective: Vital Signs: Ht 6' (1.829 m)   Wt 244 lb (110.7 kg)   BMI 33.09 kg/m   Specialty Comments:  No specialty comments available.  PMFS History: Patient Active Problem List   Diagnosis Date Noted  . Abscess of left foot 12/13/2019  . Diabetes mellitus type II, non insulin dependent (HCC) 07/09/2017  . Subacute osteomyelitis, left ankle and foot (HCC) 07/09/2017  . Hypokalemia 07/09/2017  . Hyponatremia 07/09/2017  . AKI (acute kidney injury) (HCC) 07/09/2017  . Symptomatic anemia 07/09/2017  . Essential hypertension 07/09/2017  . Depression with anxiety 07/09/2017  . Venous thrombosis 07/09/2017   Past Medical History:  Diagnosis Date  . Abscess of right foot   . ADHD   . Anemia   . Anxiety   . Arthritis   . Asthma   . COPD (chronic obstructive  pulmonary disease) (HCC)   . Diabetes mellitus   . Diabetic foot ulcer (HCC) 07/09/2017  . 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 Foot osteomyelitis, left (HCC)   . GERD (gastroesophageal reflux disease)   . Gout   . History of blood transfusion   . Hypertension   . Myocardial infarction [facility])    ? 2008,  states he did not have a cath done and never saw a cardiologist  . Neuropathy   . Pancreatitis   . Stroke [facility])    TIA- no residual  . Wears glasses     No family history on file.  Past Surgical History:  Procedure Laterality Date  . AMPUTATION Left 11/09/2018   Procedure: LEFT TRANSMETATARSAL AMPUTATION;  Surgeon: Nadara Mustard, MD;  Location: [facility] OR;  Service: Orthopedics;  Laterality: Left;  . AMPUTATION Left 12/13/2019   Procedure: LEFT BELOW KNEE AMPUTATION;  Surgeon: Nadara Mustard, MD;  Location: [facility] OR;  Service: Orthopedics;  Laterality: Left;  . APPENDECTOMY    . FOOT SURGERY Left   . LAPAROSCOPIC APPENDECTOMY  07/28/2011   Procedure: APPENDECTOMY LAPAROSCOPIC;  Surgeon: Fabio Bering, MD;  Location: [facility]S;  Service: General;  Laterality: N/A;  . LEG SURGERY     Social History   Occupational History  . Not on file  Tobacco Use  . Smoking status: Former Smoker    Types: Cigarettes  . Smokeless tobacco: Never Used  . Tobacco comment: quit smoking cigarettes 27 years ago 09/05/19  Vaping Use  . Vaping Use: Never used  Substance and Sexual Activity  . Alcohol use: Yes    Comment: 12/13/19- 2 40oz beer a week  . Drug use: Yes    Types: Marijuana    Comment: CBD every 3 days  . Sexual activity: Not on file

## 2019-12-26 ENCOUNTER — Telehealth: Payer: Self-pay

## 2019-12-26 NOTE — Telephone Encounter (Signed)
Patient would like a call back, stated that he has some questions.  Cb# [phone].  Please advise.  Thank you.

## 2019-12-26 NOTE — Telephone Encounter (Signed)
I called and lm on vm to advise pt to call the office and leave a detailed message so I can then relay that information to Dr. Lajoyce Corners and then call back with answers. Will sign off on this message and await return call.

## 2019-12-29 ENCOUNTER — Ambulatory Visit (INDEPENDENT_AMBULATORY_CARE_PROVIDER_SITE_OTHER): Payer: Medicaid Other | Admitting: Family

## 2019-12-29 ENCOUNTER — Encounter: Payer: Self-pay | Admitting: Family

## 2019-12-29 DIAGNOSIS — Z89512 Acquired absence of left leg below knee: Secondary | ICD-10-CM | POA: Insufficient documentation

## 2019-12-29 MED ORDER — OXYCODONE HCL 5 MG PO TABS
5.0000 mg | ORAL_TABLET | ORAL | 0 refills | Status: DC | PRN
Start: 2019-12-29 — End: 2020-01-05

## 2019-12-29 NOTE — Progress Notes (Signed)
Post-Op Visit Note   Patient: [name]           Date of Birth: [birth year]           MRN: [id number] Visit Date: 12/29/2019 PCP: Alvina Filbert, MD  Chief Complaint:  Chief Complaint  Patient presents with  . Left Knee - Routine Post Op    HPI:  HPI Patient is a 54 year old gentleman seen today status post left below-knee amputation.  He is wearing a limb protector and a medical compression shrinker.  He does follow with pain management.  Ortho Exam Incision well approximated staples there is no gaping minimal bloody drainage laterally.  There is no surrounding erythema no significant edema  Visit Diagnoses:  1. Acquired absence of left leg below knee (HCC)     Plan: Continue daily dose of cleansing.  Dry dressing changes.  Follow-up in the office in 1 week plan to harvest staples  Follow-Up Instructions: Return in about 1 week (around 01/05/2020).   Imaging: No results found.  Orders:  No orders of the defined types were placed in this encounter.  No orders of the defined types were placed in this encounter.    PMFS History: Patient Active Problem List   Diagnosis Date Noted  . Acquired absence of left leg below knee (HCC) 12/29/2019  . Abscess of left foot 12/13/2019  . Diabetes mellitus type II, non insulin dependent (HCC) 07/09/2017  . Subacute osteomyelitis, left ankle and foot (HCC) 07/09/2017  . Hypokalemia 07/09/2017  . Hyponatremia 07/09/2017  . AKI (acute kidney injury) (HCC) 07/09/2017  . Symptomatic anemia 07/09/2017  . Essential hypertension 07/09/2017  . Depression with anxiety 07/09/2017  . Venous thrombosis 07/09/2017   Past Medical History:  Diagnosis Date  . Abscess of right foot   . ADHD   . Anemia   . Anxiety   . Arthritis   . Asthma   . COPD (chronic obstructive pulmonary disease) (HCC)   . Diabetes mellitus   . Diabetic foot ulcer (HCC) 07/09/2017  . Diabetic ulcer of back associated with type 2 diabetes mellitus, limited to  breakdown of skin (HCC)   . Foot osteomyelitis, left (HCC)   . GERD (gastroesophageal reflux disease)   . Gout   . History of blood transfusion   . Hypertension   . Myocardial infarction [facility])    ? 2008,  states he did not have a cath done and never saw a cardiologist  . Neuropathy   . Pancreatitis   . Stroke [facility])    TIA- no residual  . Wears glasses     History reviewed. No pertinent family history.  Past Surgical History:  Procedure Laterality Date  . AMPUTATION Left 11/09/2018   Procedure: LEFT TRANSMETATARSAL AMPUTATION;  Surgeon: Nadara Mustard, MD;  Location: [facility] OR;  Service: Orthopedics;  Laterality: Left;  . AMPUTATION Left 12/13/2019   Procedure: LEFT BELOW KNEE AMPUTATION;  Surgeon: Nadara Mustard, MD;  Location: [facility] OR;  Service: Orthopedics;  Laterality: Left;  . APPENDECTOMY    . FOOT SURGERY Left   . LAPAROSCOPIC APPENDECTOMY  07/28/2011   Procedure: APPENDECTOMY LAPAROSCOPIC;  Surgeon: Fabio Bering, MD;  Location: [facility]S;  Service: General;  Laterality: N/A;  . LEG SURGERY     Social History   Occupational History  . Not on file  Tobacco Use  . Smoking status: Former Smoker    Types: Cigarettes  . Smokeless tobacco: Never Used  . Tobacco comment: quit smoking  cigarettes 27 years ago 09/05/19  Vaping Use  . Vaping Use: Never used  Substance and Sexual Activity  . Alcohol use: Yes    Comment: 12/13/19- 2 40oz beer a week  . Drug use: Yes    Types: Marijuana    Comment: CBD every 3 days  . Sexual activity: Not on file

## 2019-12-29 NOTE — Addendum Note (Signed)
Addended by: Adonis Huguenin on: 12/29/2019 09:56 AM   Modules accepted: Orders

## 2020-01-05 ENCOUNTER — Encounter: Payer: Self-pay | Admitting: Family

## 2020-01-05 ENCOUNTER — Ambulatory Visit (INDEPENDENT_AMBULATORY_CARE_PROVIDER_SITE_OTHER): Payer: Medicaid Other | Admitting: Family

## 2020-01-05 DIAGNOSIS — Z89512 Acquired absence of left leg below knee: Secondary | ICD-10-CM

## 2020-01-05 MED ORDER — OXYCODONE HCL 5 MG PO TABS: 5.0000 mg | ORAL_TABLET | Freq: Four times a day (QID) | ORAL | 0 refills | Status: DC | PRN

## 2020-01-05 NOTE — Progress Notes (Signed)
Post-Op Visit Note   Patient: [name]           Date of Birth: 1965/12/17           MRN: [id number] Visit Date: 01/05/2020 PCP: Alvina Filbert, MD  Chief Complaint: No chief complaint on file.   HPI:  HPI The patient is a 54 year old gentleman seen today status post left below-knee amputation on August 25.  He has been using a shrinker as using a wheelchair for mobility.  Ortho Exam Incision is well approximated with staples is healing well there is no gaping no drainage no surrounding erythema no sign of infection  Visit Diagnoses: No diagnosis found.  Plan: Staples harvested today without incident.  He will continue with daily Dial soap cleansing.  Dry dressing changes.  Proceed with prosthesis set up follow-up in the office in 2 more weeks  Follow-Up Instructions: Return in about 2 weeks (around 01/19/2020).   Imaging: No results found.  Orders:  No orders of the defined types were placed in this encounter.  Meds ordered this encounter  Medications  . oxyCODONE (OXY IR/ROXICODONE) 5 MG immediate release tablet    Sig: Take 1 tablet (5 mg total) by mouth every 6 (six) hours as needed for moderate pain (pain score 4-6).    Dispense:  30 tablet    Refill:  0     PMFS History: Patient Active Problem List   Diagnosis Date Noted  . Acquired absence of left leg below knee (HCC) 12/29/2019  . Abscess of left foot 12/13/2019  . Diabetes mellitus type II, non insulin dependent (HCC) 07/09/2017  . Subacute osteomyelitis, left ankle and foot (HCC) 07/09/2017  . Hypokalemia 07/09/2017  . Hyponatremia 07/09/2017  . AKI (acute kidney injury) (HCC) 07/09/2017  . Symptomatic anemia 07/09/2017  . Essential hypertension 07/09/2017  . Depression with anxiety 07/09/2017  . Venous thrombosis 07/09/2017   Past Medical History:  Diagnosis Date  . Abscess of right foot   . ADHD   . Anemia   . Anxiety   . Arthritis   . Asthma   . COPD (chronic obstructive pulmonary  disease) (HCC)   . Diabetes mellitus   . Diabetic foot ulcer (HCC) 07/09/2017  . 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 Foot osteomyelitis, left (HCC)   . GERD (gastroesophageal reflux disease)   . Gout   . History of blood transfusion   . Hypertension   . Myocardial infarction [facility])    ? 2008,  states he did not have a cath done and never saw a cardiologist  . Neuropathy   . Pancreatitis   . Stroke [facility])    TIA- no residual  . Wears glasses     History reviewed. No pertinent family history.  Past Surgical History:  Procedure Laterality Date  . AMPUTATION Left 11/09/2018   Procedure: LEFT TRANSMETATARSAL AMPUTATION;  Surgeon: Nadara Mustard, MD;  Location[facility] OR;  Service: Orthopedics;  Laterality: Left;  . AMPUTATION Left 12/13/2019   Procedure: LEFT BELOW KNEE AMPUTATION;  Surgeon: Nadara Mustard, MD;  Location: [facility] OR;  Service: Orthopedics;  Laterality: Left;  . APPENDECTOMY    . FOOT SURGERY Left   . LAPAROSCOPIC APPENDECTOMY  07/28/2011   Procedure: APPENDECTOMY LAPAROSCOPIC;  Surgeon: Fabio Bering, MD;  Location: [facility]S;  Service: General;  Laterality: N/A;  . LEG SURGERY     Social History   Occupational  History  . Not on file  Tobacco Use  . Smoking status: Former Smoker    Types: Cigarettes  . Smokeless tobacco: Never Used  . Tobacco comment: quit smoking cigarettes 27 years ago 09/05/19  Vaping Use  . Vaping Use: Never used  Substance and Sexual Activity  . Alcohol use: Yes    Comment: 12/13/19- 2 40oz beer a week  . Drug use: Yes    Types: Marijuana    Comment: CBD every 3 days  . Sexual activity: Not on file

## 2020-01-17 ENCOUNTER — Other Ambulatory Visit: Payer: Self-pay | Admitting: Orthopedic Surgery

## 2020-01-17 ENCOUNTER — Telehealth: Payer: Self-pay

## 2020-01-17 MED ORDER — OXYCODONE HCL 5 MG PO TABS: 5.0000 mg | ORAL_TABLET | ORAL | 0 refills | Status: DC | PRN

## 2020-01-17 NOTE — Telephone Encounter (Signed)
Pls advise.  

## 2020-01-17 NOTE — Telephone Encounter (Signed)
Lvm informing pt.

## 2020-01-17 NOTE — Telephone Encounter (Signed)
Patient would like a Rx refill on Oxycodone 5mg  sent to [facility].  Cb# [phone].  Please advise.  Thank you.

## 2020-01-17 NOTE — Telephone Encounter (Signed)
Please call pt to advise done.

## 2020-01-17 NOTE — Telephone Encounter (Signed)
Rx sent 

## 2020-01-19 ENCOUNTER — Ambulatory Visit: Payer: Medicaid Other | Admitting: Family

## 2020-01-26 ENCOUNTER — Ambulatory Visit: Payer: Self-pay

## 2020-01-26 ENCOUNTER — Encounter: Payer: Self-pay | Admitting: Family

## 2020-01-26 ENCOUNTER — Ambulatory Visit (INDEPENDENT_AMBULATORY_CARE_PROVIDER_SITE_OTHER): Payer: Medicaid Other | Admitting: Family

## 2020-01-26 DIAGNOSIS — L97521 Non-pressure chronic ulcer of other part of left foot limited to breakdown of skin: Secondary | ICD-10-CM

## 2020-01-26 DIAGNOSIS — L97511 Non-pressure chronic ulcer of other part of right foot limited to breakdown of skin: Secondary | ICD-10-CM | POA: Diagnosis not present

## 2020-01-26 NOTE — Progress Notes (Signed)
Office Visit Note   Patient: [name]           Date of Birth: Jul 25, 1965           MRN: [id number] Visit Date: 01/26/2020              Requested by: Alvina Filbert, MD [address] [place],  [place] [address] PCP: Alvina Filbert, MD  Chief Complaint  Patient presents with  . Left Leg - Follow-up, Routine Post Op  . Right Foot - Wound Check      HPI: The patient is a 54 year old gentleman seen in follow-up status post left below-knee amputation.  Feels things are going pretty well he has 2 open areas laterally that have yet to heal.  Continues with shrinker and direct skin contact.  States has an appointment with his prosthetists in 1 week.  Today his main concern is of the right foot he is concerned that he may lose his right lower extremity as well wonders what can be done to save his right foot  Assessment & Plan: Visit Diagnoses:  1. Right foot ulcer, limited to breakdown of skin (HCC)   2. Plantar ulcer, left, limited to breakdown of skin (HCC)     Plan: Radiographs of the right foot today.  Erosive changes of the fifth toe as well as the distal fifth metatarsal consistent with osteomyelitis.  Patient reports Dr. Lajoyce Corners is aware and plans for further limb salvage surgery once his left residual limb is well-healed and he is ambulating in his prosthesis he would like to follow-up with Dr. Lajoyce Corners to discuss surgical options at his next appointment.  At this point he will continue with his current wound care to the right foot with daily wound cleansing and Silvadene dressing changes nonweightbearing offload the ulcerated area  Follow-Up Instructions: No follow-ups on file.   Ortho Exam  Patient is alert, oriented, no adenopathy, well-dressed, normal affect, normal respiratory effort.  On examination of the left residual limb the 2 lateral ulcerated areas; these are just 3 mm in diameter filled in with bleeding granulation tissue there is no probing no surrounding  erythema no sign of infection he does continue to have moderate edema to the residual limb without erythema or warmth.  On examination of the right foot he has a 2 and half centimeter in diameter plantar ulceration beneath the third metatarsal head this probes 8 mm deep does not probe to bone or tendon.  There is some surrounding callus and nonviable tissue there is no surrounding erythema no warmth no odor or purulence.  He does have erythema and sausage digit swelling of the fifth toe.  Imaging: No results found. No images are attached to the encounter.  Labs: Lab Results  Component Value Date   HGBA1C 11.4 (H) 12/13/2019   HGBA1C 9.7 (H) 11/09/2018   REPTSTATUS 07/14/2017 FINAL 07/09/2017   REPTSTATUS 07/14/2017 FINAL 07/09/2017   CULT  07/09/2017    NO GROWTH 5 DAYS Performed at [facility], [address], [place], [place] [address]    CULT  07/09/2017    NO GROWTH 5 DAYS Performed at [facility], [address], [place], [place] [address]      Lab Results  Component Value Date   ALBUMIN 2.9 (L) 12/13/2019   ALBUMIN 2.5 (L) 08/31/2019   ALBUMIN 3.8 05/03/2019    Lab Results  Component Value Date   MG 1.4 (L) 07/11/2017   No results found for: [facility]  No results found for: PREALBUMIN CBC EXTENDED Latest Ref Rng & Units 12/13/2019 08/31/2019 05/03/2019  WBC 4.0 - 10.5 K/uL 7.5 8.0 10.1  RBC 4.22 - 5.81 MIL/uL 3.66(L) 3.71(L) 4.09(L)  HGB 13.0 - 17.0 g/dL 10.1(L) 10.6(L) 12.9(L)  HCT 39 - 52 % 32.3(L) 33.8(L) 38.1(L)  PLT 150 - 400 K/uL 251 312 175  NEUTROABS 1.7 - 7.7 K/uL - 5.9 -  LYMPHSABS 0.7 - 4.0 K/uL - 1.4 -     There is no height or weight on file to calculate BMI.  Orders:  Orders Placed This Encounter  Procedures  . XR Foot 2 Views Right   No orders of the defined types were placed in this encounter.    Procedures: No procedures performed  Clinical Data: No additional findings.  ROS:  All other systems negative, except as noted in the  HPI. Review of Systems  Constitutional: Negative for chills and fever.  Cardiovascular: Positive for leg swelling.  Skin: Positive for color change and wound.    Objective: Vital Signs: There were no vitals taken for this visit.  Specialty Comments:  No specialty comments available.  PMFS History: Patient Active Problem List   Diagnosis Date Noted  . Acquired absence of left leg below knee (HCC) 12/29/2019  . Abscess of left foot 12/13/2019  . Diabetes mellitus type II, non insulin dependent (HCC) 07/09/2017  . Subacute osteomyelitis, left ankle and foot (HCC) 07/09/2017  . Hypokalemia 07/09/2017  . Hyponatremia 07/09/2017  . AKI (acute kidney injury) (HCC) 07/09/2017  . Symptomatic anemia 07/09/2017  . Essential hypertension 07/09/2017  . Depression with anxiety 07/09/2017  . Venous thrombosis 07/09/2017   Past Medical History:  Diagnosis Date  . Abscess of right foot   . ADHD   . Anemia   . Anxiety   . Arthritis   . Asthma   . COPD (chronic obstructive pulmonary disease) (HCC)   . Diabetes mellitus   . Diabetic foot ulcer (HCC) 07/09/2017  . 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 Foot osteomyelitis, left (HCC)   . GERD (gastroesophageal reflux disease)   . Gout   . History of blood transfusion   . Hypertension   . Myocardial infarction [facility])    ? 2008,  states he did not have a cath done and never saw a cardiologist  . Neuropathy   . Pancreatitis   . Stroke [facility])    TIA- no residual  . Wears glasses     No family history on file.  Past Surgical History:  Procedure Laterality Date  . AMPUTATION Left 11/09/2018   Procedure: LEFT TRANSMETATARSAL AMPUTATION;  Surgeon: Nadara Mustard, MD;  Location: [facility] OR;  Service: Orthopedics;  Laterality: Left;  . AMPUTATION Left 12/13/2019   Procedure: LEFT BELOW KNEE AMPUTATION;  Surgeon: Nadara Mustard, MD;  [facility];  Service: Orthopedics;  Laterality:  Left;  . APPENDECTOMY    . FOOT SURGERY Left   . LAPAROSCOPIC APPENDECTOMY  07/28/2011   Procedure: APPENDECTOMY LAPAROSCOPIC;  Surgeon: Fabio Bering, MD;  Location: [facility] ORS;  Service: General;  Laterality: N/A;  . LEG SURGERY     Social History   Occupational History  . Not on file  Tobacco Use  . Smoking status: Former Smoker    Types: Cigarettes  . Smokeless tobacco: Never Used  . Tobacco comment: quit smoking cigarettes 27 years ago 09/05/19  Vaping Use  . Vaping  Use: Never used  Substance and Sexual Activity  . Alcohol use: Yes    Comment: 12/13/19- 2 40oz beer a week  . Drug use: Yes    Types: Marijuana    Comment: CBD every 3 days  . Sexual activity: Not on file

## 2020-02-22 ENCOUNTER — Ambulatory Visit (INDEPENDENT_AMBULATORY_CARE_PROVIDER_SITE_OTHER): Payer: Medicaid Other | Admitting: Orthopedic Surgery

## 2020-02-22 ENCOUNTER — Encounter: Payer: Self-pay | Admitting: Orthopedic Surgery

## 2020-02-22 DIAGNOSIS — Z89512 Acquired absence of left leg below knee: Secondary | ICD-10-CM

## 2020-02-22 DIAGNOSIS — L97511 Non-pressure chronic ulcer of other part of right foot limited to breakdown of skin: Secondary | ICD-10-CM | POA: Diagnosis not present

## 2020-02-22 NOTE — Progress Notes (Signed)
Office Visit Note   Patient: [name]           Date of Birth: Apr 22, 1965           MRN: [id number] Visit Date: 02/22/2020              Requested by: Alvina Filbert, MD [address] [place],  [place] [address] PCP: Alvina Filbert, MD  Chief Complaint  Patient presents with  . Right Foot - Pain, Follow-up      HPI: Patient is a 54 year old gentleman is seen for 2 separate issues #1 status post left transtibial amputation.  #2 Wagner grade 1 ulcer right foot right forefoot.  Patient states he is going to follow-up with [facility] for prosthetic fitting next week he will follow up with [facility] for gait training and follow-up in the office in 4 weeks.  Patient does have a Darco shoe with a good pressure relieving insert for the right foot.  Assessment & Plan: Visit Diagnoses:  1. Right foot ulcer, limited to breakdown of skin (HCC)   2. Acquired absence of left leg below knee [facility])     Plan: Follow-up with [facility] for prosthetic fitting Robin for gait training.  A new felt pad was placed in his orthotic to further unload the metatarsal heads discussed the importance of minimizing weightbearing patient is currently in a wheelchair.  Follow-Up Instructions: Return in about 4 weeks (around 03/21/2020).   Ortho Exam  Patient is alert, oriented, no adenopathy, well-dressed, normal affect, normal respiratory effort. Examination patient ambulates in a wheelchair he has a well-healed left transtibial amputation no ulcers no cellulitis no drainage no signs of infection peer examination the right foot he has a Wagner grade 1 ulcer beneath the second and third metatarsal heads the ulcer is 3 cm in diameter 2 mm deep with healthy granulation tissue there is no exposed bone no tendon no drainage no cellulitis no signs of infection.  He has a strong dorsalis pedis pulse.  A new felt relieving pad was placed to get more pressure under the arch and off the metatarsal heads the foam orthotic in  his foot has had pressure relieving dots removed to unload the metatarsal heads.  Patient is drinking [facility].  Imaging: No results found. No images are attached to the encounter.  Labs: Lab Results  Component Value Date   HGBA1C 11.4 (H) 12/13/2019   HGBA1C 9.7 (H) 11/09/2018   REPTSTATUS 07/14/2017 FINAL 07/09/2017   REPTSTATUS 07/14/2017 FINAL 07/09/2017   CULT  07/09/2017    NO GROWTH 5 DAYS Performed at [facility], [address], [place], [place] [address]    CULT  07/09/2017    NO GROWTH 5 DAYS Performed at [facility], [address], [place], [place] [address]      Lab Results  Component Value Date   ALBUMIN 2.9 (L) 12/13/2019   ALBUMIN 2.5 (L) 08/31/2019   ALBUMIN 3.8 05/03/2019    Lab Results  Component Value Date   MG 1.4 (L) 07/11/2017   No results found for: VD25OH  No results found for: PREALBUMIN CBC EXTENDED Latest Ref Rng & Units 12/13/2019 08/31/2019 05/03/2019  WBC 4.0 - 10.5 K/uL 7.5 8.0 10.1  RBC 4.22 - 5.81 MIL/uL 3.66(L) 3.71(L) 4.09(L)  HGB 13.0 - 17.0 g/dL 10.1(L) 10.6(L) 12.9(L)  HCT 39 - 52 % 32.3(L) 33.8(L) 38.1(L)  PLT 150 - 400 K/uL 251 312 175  NEUTROABS 1.7 - 7.7 K/uL - 5.9 -  LYMPHSABS  0.7 - 4.0 K/uL - 1.4 -     There is no height or weight on file to calculate BMI.  Orders:  No orders of the defined types were placed in this encounter.  No orders of the defined types were placed in this encounter.    Procedures: No procedures performed  Clinical Data: No additional findings.  ROS:  All other systems negative, except as noted in the HPI. Review of Systems  Objective: Vital Signs: There were no vitals taken for this visit.  Specialty Comments:  No specialty comments available.  PMFS History: Patient Active Problem List   Diagnosis Date Noted  . Acquired absence of left leg below knee (HCC) 12/29/2019  . Abscess of left foot 12/13/2019  . Diabetes mellitus type II, non insulin dependent (HCC) 07/09/2017    . Subacute osteomyelitis, left ankle and foot (HCC) 07/09/2017  . Hypokalemia 07/09/2017  . Hyponatremia 07/09/2017  . AKI (acute kidney injury) (HCC) 07/09/2017  . Symptomatic anemia 07/09/2017  . Essential hypertension 07/09/2017  . Depression with anxiety 07/09/2017  . Venous thrombosis 07/09/2017   Past Medical History:  Diagnosis Date  . Abscess of right foot   . ADHD   . Anemia   . Anxiety   . Arthritis   . Asthma   . COPD (chronic obstructive pulmonary disease) (HCC)   . Diabetes mellitus   . Diabetic foot ulcer (HCC) 07/09/2017  . 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 Foot osteomyelitis, left (HCC)   . GERD (gastroesophageal reflux disease)   . Gout   . History of blood transfusion   . Hypertension   . Myocardial infarction [facility])    ? 2008,  states he did not have a cath done and never saw a cardiologist  . Neuropathy   . Pancreatitis   . Stroke [facility])    TIA- no residual  . Wears glasses     History reviewed. No pertinent family history.  Past Surgical History:  Procedure Laterality Date  . AMPUTATION Left 11/09/2018   Procedure: LEFT TRANSMETATARSAL AMPUTATION;  Surgeon: Nadara Mustard, MD;  Location: [facility] OR;  Service: Orthopedics;  Laterality: Left;  . AMPUTATION Left 12/13/2019   Procedure: LEFT BELOW KNEE AMPUTATION;  Surgeon: Nadara Mustard, MD;  Location: [facility] OR;  Service: Orthopedics;  Laterality: Left;  . APPENDECTOMY    . FOOT SURGERY Left   . LAPAROSCOPIC APPENDECTOMY  07/28/2011   Procedure: APPENDECTOMY LAPAROSCOPIC;  Surgeon: Fabio Bering, MD;  Location: [facility] ORS;  Service: General;  Laterality: N/A;  . LEG SURGERY     Social History   Occupational History  . Not on file  Tobacco Use  . Smoking status: Former Smoker    Types: Cigarettes  . Smokeless tobacco: Never Used  . Tobacco comment: quit smoking cigarettes 27 years ago 09/05/19  Vaping Use  . Vaping Use: Never used   Substance and Sexual Activity  . Alcohol use: Yes    Comment: 12/13/19- 2 40oz beer a week  . Drug use: Yes    Types: Marijuana    Comment: CBD every 3 days  . Sexual activity: Not on file

## 2020-03-07 ENCOUNTER — Telehealth: Payer: Self-pay | Admitting: Physical Therapy

## 2020-03-07 ENCOUNTER — Other Ambulatory Visit: Payer: Self-pay | Admitting: Orthopedic Surgery

## 2020-03-07 DIAGNOSIS — Z89512 Acquired absence of left leg below knee: Secondary | ICD-10-CM

## 2020-03-07 NOTE — Telephone Encounter (Signed)
[facility] is delivered his prosthesis 11/16. Can you please place a referral in Epic? He will need to be seen at [facility] as he has Medicaid. Thank you Zella Ball

## 2020-03-21 ENCOUNTER — Encounter: Payer: Self-pay | Admitting: Orthopedic Surgery

## 2020-03-21 ENCOUNTER — Ambulatory Visit (INDEPENDENT_AMBULATORY_CARE_PROVIDER_SITE_OTHER): Payer: Medicaid Other | Admitting: Physician Assistant

## 2020-03-21 VITALS — Ht 72.0 in | Wt 244.0 lb

## 2020-03-21 DIAGNOSIS — L97514 Non-pressure chronic ulcer of other part of right foot with necrosis of bone: Secondary | ICD-10-CM

## 2020-03-21 NOTE — Progress Notes (Signed)
Office Visit Note   Patient: [name]           Date of Birth: 12/16/1965           MRN: [id number] Visit Date: 03/21/2020              Requested by: William Filbert, MD [address] [place],  [place] [address] PCP: William Filbert, MD  Chief Complaint  Patient presents with  . Left Leg - Follow-up    12/13/19 left BKA       HPI: Patient is postop 3-1/2 months status post left below-knee amputation.  He has his prosthetic and is doing very well occasional mild pain but has no complaints we have also followed him for a rate grade 1 Wagner ulcer on the plantar surface of his forefoot this continues to do well he believes it is improving he has been offloading it with a shoe and pad  Assessment & Plan: Visit Diagnoses: No diagnosis found.  Plan: Follow-up in 1 month for reevaluation of his right foot  Follow-Up Instructions: No follow-ups on file.   Ortho Exam  Patient is alert, oriented, no adenopathy, well-dressed, normal affect, normal respiratory effort. Left below-knee amputation stump well-healed no eschar no erythema cellulitis prosthetic is fitting well. Right grade 1 ulcer beneath his right forefoot.  Is a millimeter or two deep 4 cm in diameter with good epithelialization around the edges.  Mild callusing beyond that.  Does not probe to bone no foul odor no surrounding cellulitis  Imaging: No results found. No images are attached to the encounter.  Labs: Lab Results  Component Value Date   HGBA1C 11.4 (H) 12/13/2019   HGBA1C 9.7 (H) 11/09/2018   REPTSTATUS 07/14/2017 FINAL 07/09/2017   REPTSTATUS 07/14/2017 FINAL 07/09/2017   CULT  07/09/2017    NO GROWTH 5 DAYS Performed at [facility], [address] [place] [address], [place], [place] [address]    CULT  07/09/2017    NO GROWTH 5 DAYS Performed at [facility], [address] [place], [place] [address]      Lab Results  Component Value Date   ALBUMIN 2.9 (L) 12/13/2019   ALBUMIN 2.5 (L) 08/31/2019   ALBUMIN  3.8 05/03/2019    Lab Results  Component Value Date   MG 1.4 (L) 07/11/2017   No results found for: VD25OH  No results found for: PREALBUMIN CBC EXTENDED Latest Ref Rng & Units 12/13/2019 08/31/2019 05/03/2019  WBC 4.0 - 10.5 K/uL 7.5 8.0 10.1  RBC 4.22 - 5.81 MIL/uL 3.66(L) 3.71(L) 4.09(L)  HGB 13.0 - 17.0 g/dL 10.1(L) 10.6(L) 12.9(L)  HCT 39 - 52 % 32.3(L) 33.8(L) 38.1(L)  PLT 150 - 400 K/uL 251 312 175  NEUTROABS 1.7 - 7.7 K/uL - 5.9 -  LYMPHSABS 0.7 - 4.0 K/uL - 1.4 -     Body mass index is 33.09 kg/m.  Orders:  No orders of the defined types were placed in this encounter.  No orders of the defined types were placed in this encounter.    Procedures: No procedures performed  Clinical Data: No additional findings.  ROS:  All other systems negative, except as noted in the HPI. Review of Systems  Objective: Vital Signs: Ht 6' (1.829 m)   Wt 244 lb (110.7 kg)   BMI 33.09 kg/m   Specialty Comments:  No specialty comments available.  PMFS History: Patient Active Problem List   Diagnosis Date Noted  . Acquired absence of left leg below knee (HCC)  12/29/2019  . Abscess of left foot 12/13/2019  . Diabetes mellitus type II, non insulin dependent (HCC) 07/09/2017  . Subacute osteomyelitis, left ankle and foot (HCC) 07/09/2017  . Hypokalemia 07/09/2017  . Hyponatremia 07/09/2017  . AKI (acute kidney injury) (HCC) 07/09/2017  . Symptomatic anemia 07/09/2017  . Essential hypertension 07/09/2017  . Depression with anxiety 07/09/2017  . Venous thrombosis 07/09/2017   Past Medical History:  Diagnosis Date  . Abscess of right foot   . ADHD   . Anemia   . Anxiety   . Arthritis   . Asthma   . COPD (chronic obstructive pulmonary disease) (HCC)   . Diabetes mellitus   . Diabetic foot ulcer (HCC) 07/09/2017  . 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 Foot osteomyelitis, left (HCC)   . GERD  (gastroesophageal reflux disease)   . Gout   . History of blood transfusion   . Hypertension   . Myocardial infarction [facility])    ? 2008,  states he did not have a cath done and never saw a cardiologist  . Neuropathy   . Pancreatitis   . Stroke [facility])    TIA- no residual  . Wears glasses     No family history on file.  Past Surgical History:  Procedure Laterality Date  . AMPUTATION Left 11/09/2018   Procedure: LEFT TRANSMETATARSAL AMPUTATION;  Surgeon: Nadara Mustard, MD;  Location: [facility] OR;  Service: Orthopedics;  Laterality: Left;  . AMPUTATION Left 12/13/2019   Procedure: LEFT BELOW KNEE AMPUTATION;  Surgeon: Nadara Mustard, MD;  Location: [facility] OR;  Service: Orthopedics;  Laterality: Left;  . APPENDECTOMY    . FOOT SURGERY Left   . LAPAROSCOPIC APPENDECTOMY  07/28/2011   Procedure: APPENDECTOMY LAPAROSCOPIC;  Surgeon: Fabio Bering, MD;  Location: [facility] ORS;  Service: General;  Laterality: N/A;  . LEG SURGERY     Social History   Occupational History  . Not on file  Tobacco Use  . Smoking status: Former Smoker    Types: Cigarettes  . Smokeless tobacco: Never Used  . Tobacco comment: quit smoking cigarettes 27 years ago 09/05/19  Vaping Use  . Vaping Use: Never used  Substance and Sexual Activity  . Alcohol use: Yes    Comment: 12/13/19- 2 40oz beer a week  . Drug use: Yes    Types: Marijuana    Comment: CBD every 3 days  . Sexual activity: Not on file

## 2020-04-18 ENCOUNTER — Other Ambulatory Visit: Payer: Self-pay

## 2020-04-18 ENCOUNTER — Ambulatory Visit (INDEPENDENT_AMBULATORY_CARE_PROVIDER_SITE_OTHER): Payer: Medicaid Other | Admitting: Physician Assistant

## 2020-04-18 ENCOUNTER — Encounter: Payer: Self-pay | Admitting: Orthopedic Surgery

## 2020-04-18 VITALS — Ht 72.0 in | Wt 244.0 lb

## 2020-04-18 DIAGNOSIS — L97511 Non-pressure chronic ulcer of other part of right foot limited to breakdown of skin: Secondary | ICD-10-CM | POA: Diagnosis not present

## 2020-04-18 NOTE — Progress Notes (Signed)
Office Visit Note   Patient: [name]           Date of Birth: 02-Feb-1966           MRN: [id number] Visit Date: 04/18/2020              Requested by: Alvina Filbert, MD [address] [place],  [place] [address] PCP: Alvina Filbert, MD  Chief Complaint  Patient presents with  . Left Leg - Follow-up    12/13/19 left BKA   . Right Foot - Follow-up    Ulcer       HPI: This is a pleasant 54 year old gentleman who is status post left below-knee amputation in August with regards to this he is doing quite well.  We have been following him for a right plantar foot ulcer.  He has been using an offloading shoe and doing daily dressing changes  Assessment & Plan: Visit Diagnoses: No diagnosis found.  Plan: Continue to offload foot in the area of the ulcer follow-up in 1 month or sooner if any concerns  Follow-Up Instructions: No follow-ups on file.   Ortho Exam  Patient is alert, oriented, no adenopathy, well-dressed, normal affect, normal respiratory effort. Examination of his right foot demonstrates no cellulitis no foul odor.  He has a 3 x 3 cm plantar ulcer with healthy vascular tissue and some skin maceration and callusing around the outside.  It does not probe deeply to bone.  After obtaining verbal consent the macerated skin and callus was debrided.  Following debridement it measured for 4 x 3-1/2 cm in a half a centimeter deep.  Dry dressing was applied.  He was given a new pad for his postop shoe  Imaging: No results found. No images are attached to the encounter.  Labs: Lab Results  Component Value Date   HGBA1C 11.4 (H) 12/13/2019   HGBA1C 9.7 (H) 11/09/2018   REPTSTATUS 07/14/2017 FINAL 07/09/2017   REPTSTATUS 07/14/2017 FINAL 07/09/2017   CULT  07/09/2017    NO GROWTH 5 DAYS Performed at [facility], [address] [place], [place] [address]    CULT  07/09/2017    NO GROWTH 5 DAYS Performed at [facility], [address], [place], [place] [address]       Lab Results  Component Value Date   ALBUMIN 2.9 (L) 12/13/2019   ALBUMIN 2.5 (L) 08/31/2019   ALBUMIN 3.8 05/03/2019    Lab Results  Component Value Date   MG 1.4 (L) 07/11/2017   No results found for: VD25OH  No results found for: PREALBUMIN CBC EXTENDED Latest Ref Rng & Units 12/13/2019 08/31/2019 05/03/2019  WBC 4.0 - 10.5 K/uL 7.5 8.0 10.1  RBC 4.22 - 5.81 MIL/uL 3.66(L) 3.71(L) 4.09(L)  HGB 13.0 - 17.0 g/dL 10.1(L) 10.6(L) 12.9(L)  HCT 39.0 - 52.0 % 32.3(L) 33.8(L) 38.1(L)  PLT 150 - 400 K/uL 251 312 175  NEUTROABS 1.7 - 7.7 K/uL - 5.9 -  LYMPHSABS 0.7 - 4.0 K/uL - 1.4 -     Body mass index is 33.09 kg/m.  Orders:  No orders of the defined types were placed in this encounter.  No orders of the defined types were placed in this encounter.    Procedures: No procedures performed  Clinical Data: No additional findings.  ROS:  All other systems negative, except as noted in the HPI. Review of Systems  Objective: Vital Signs: Ht 6' (1.829 m)   Wt 244 lb (110.7 kg)   BMI  33.09 kg/m   Specialty Comments:  No specialty comments available.  PMFS History: Patient Active Problem List   Diagnosis Date Noted  . Acquired absence of left leg below knee (HCC) 12/29/2019  . Abscess of left foot 12/13/2019  . Diabetes mellitus type II, non insulin dependent (HCC) 07/09/2017  . Subacute osteomyelitis, left ankle and foot (HCC) 07/09/2017  . Hypokalemia 07/09/2017  . Hyponatremia 07/09/2017  . AKI (acute kidney injury) (HCC) 07/09/2017  . Symptomatic anemia 07/09/2017  . Essential hypertension 07/09/2017  . Depression with anxiety 07/09/2017  . Venous thrombosis 07/09/2017   Past Medical History:  Diagnosis Date  . Abscess of right foot   . ADHD   . Anemia   . Anxiety   . Arthritis   . Asthma   . COPD (chronic obstructive pulmonary disease) (HCC)   . Diabetes mellitus   . Diabetic foot ulcer (HCC) 07/09/2017  . Diabetic ulcer of back associated with  type 2 diabetes mellitus, limited to breakdown of skin (HCC)   . Foot osteomyelitis, left (HCC)   . GERD (gastroesophageal reflux disease)   . Gout   . History of blood transfusion   . Hypertension   . Myocardial infarction [facility])    ? 2008,  states he did not have a cath done and never saw a cardiologist  . Neuropathy   . Pancreatitis   . Stroke [facility])    TIA- no residual  . Wears glasses     No family history on file.  Past Surgical History:  Procedure Laterality Date  . AMPUTATION Left 11/09/2018   Procedure: LEFT TRANSMETATARSAL AMPUTATION;  Surgeon: Nadara Mustard, MD;  Location: [facility] OR;  Service: Orthopedics;  Laterality: Left;  . AMPUTATION Left 12/13/2019   Procedure: LEFT BELOW KNEE AMPUTATION;  Surgeon: Nadara Mustard, MD;  Location: [facility] OR;  Service: Orthopedics;  Laterality: Left;  . APPENDECTOMY    . FOOT SURGERY Left   . LAPAROSCOPIC APPENDECTOMY  07/28/2011   Procedure: APPENDECTOMY LAPAROSCOPIC;  Surgeon: Fabio Bering, MD;  Location: [facility] ORS;  Service: General;  Laterality: N/A;  . LEG SURGERY     Social History   Occupational History  . Not on file  Tobacco Use  . Smoking status: Former Smoker    Types: Cigarettes  . Smokeless tobacco: Never Used  . Tobacco comment: quit smoking cigarettes 27 years ago 09/05/19  Vaping Use  . Vaping Use: Never used  Substance and Sexual Activity  . Alcohol use: Yes    Comment: 12/13/19- 2 40oz beer a week  . Drug use: Yes    Types: Marijuana    Comment: CBD every 3 days  . Sexual activity: Not on file

## 2020-05-14 ENCOUNTER — Encounter: Payer: Self-pay | Admitting: Orthopedic Surgery

## 2020-05-14 ENCOUNTER — Other Ambulatory Visit: Payer: Self-pay | Admitting: Physician Assistant

## 2020-05-14 ENCOUNTER — Encounter (HOSPITAL_COMMUNITY): Payer: Self-pay | Admitting: *Deleted

## 2020-05-14 ENCOUNTER — Other Ambulatory Visit: Payer: Self-pay

## 2020-05-14 ENCOUNTER — Ambulatory Visit (INDEPENDENT_AMBULATORY_CARE_PROVIDER_SITE_OTHER): Payer: Medicaid Other | Admitting: Orthopedic Surgery

## 2020-05-14 ENCOUNTER — Inpatient Hospital Stay (HOSPITAL_COMMUNITY)
Admission: EM | Admit: 2020-05-14 | Discharge: 2020-05-17 | DRG: 854 | Disposition: A | Discharge status: Home / Self Care | Payer: Medicaid Other | Attending: Internal Medicine | Admitting: Internal Medicine

## 2020-05-14 VITALS — BP 158/76 | HR 133 | Temp 98.7°F

## 2020-05-14 DIAGNOSIS — M109 Gout, unspecified: Secondary | ICD-10-CM | POA: Diagnosis present

## 2020-05-14 DIAGNOSIS — E1165 Type 2 diabetes mellitus with hyperglycemia: Secondary | ICD-10-CM | POA: Diagnosis present

## 2020-05-14 DIAGNOSIS — E1140 Type 2 diabetes mellitus with diabetic neuropathy, unspecified: Secondary | ICD-10-CM | POA: Diagnosis present

## 2020-05-14 DIAGNOSIS — Z794 Long term (current) use of insulin: Secondary | ICD-10-CM

## 2020-05-14 DIAGNOSIS — Z8673 Personal history of transient ischemic attack (TIA), and cerebral infarction without residual deficits: Secondary | ICD-10-CM

## 2020-05-14 DIAGNOSIS — E1169 Type 2 diabetes mellitus with other specified complication: Secondary | ICD-10-CM | POA: Diagnosis present

## 2020-05-14 DIAGNOSIS — E11621 Type 2 diabetes mellitus with foot ulcer: Secondary | ICD-10-CM | POA: Diagnosis present

## 2020-05-14 DIAGNOSIS — K219 Gastro-esophageal reflux disease without esophagitis: Secondary | ICD-10-CM | POA: Diagnosis present

## 2020-05-14 DIAGNOSIS — E871 Hypo-osmolality and hyponatremia: Secondary | ICD-10-CM | POA: Diagnosis present

## 2020-05-14 DIAGNOSIS — Z20822 Contact with and (suspected) exposure to covid-19: Secondary | ICD-10-CM | POA: Diagnosis present

## 2020-05-14 DIAGNOSIS — F419 Anxiety disorder, unspecified: Secondary | ICD-10-CM | POA: Diagnosis present

## 2020-05-14 DIAGNOSIS — I252 Old myocardial infarction: Secondary | ICD-10-CM

## 2020-05-14 DIAGNOSIS — F909 Attention-deficit hyperactivity disorder, unspecified type: Secondary | ICD-10-CM | POA: Diagnosis present

## 2020-05-14 DIAGNOSIS — J449 Chronic obstructive pulmonary disease, unspecified: Secondary | ICD-10-CM

## 2020-05-14 DIAGNOSIS — M869 Osteomyelitis, unspecified: Secondary | ICD-10-CM | POA: Diagnosis present

## 2020-05-14 DIAGNOSIS — L02611 Cutaneous abscess of right foot: Secondary | ICD-10-CM | POA: Diagnosis not present

## 2020-05-14 DIAGNOSIS — M86271 Subacute osteomyelitis, right ankle and foot: Secondary | ICD-10-CM | POA: Diagnosis not present

## 2020-05-14 DIAGNOSIS — L97509 Non-pressure chronic ulcer of other part of unspecified foot with unspecified severity: Secondary | ICD-10-CM | POA: Diagnosis present

## 2020-05-14 DIAGNOSIS — Z87891 Personal history of nicotine dependence: Secondary | ICD-10-CM

## 2020-05-14 DIAGNOSIS — Z89512 Acquired absence of left leg below knee: Secondary | ICD-10-CM

## 2020-05-14 DIAGNOSIS — Z7984 Long term (current) use of oral hypoglycemic drugs: Secondary | ICD-10-CM

## 2020-05-14 DIAGNOSIS — Z7982 Long term (current) use of aspirin: Secondary | ICD-10-CM

## 2020-05-14 DIAGNOSIS — L97519 Non-pressure chronic ulcer of other part of right foot with unspecified severity: Secondary | ICD-10-CM | POA: Diagnosis present

## 2020-05-14 DIAGNOSIS — L03031 Cellulitis of right toe: Secondary | ICD-10-CM

## 2020-05-14 DIAGNOSIS — Z89422 Acquired absence of other left toe(s): Secondary | ICD-10-CM

## 2020-05-14 DIAGNOSIS — E11622 Type 2 diabetes mellitus with other skin ulcer: Secondary | ICD-10-CM | POA: Diagnosis present

## 2020-05-14 DIAGNOSIS — L97511 Non-pressure chronic ulcer of other part of right foot limited to breakdown of skin: Secondary | ICD-10-CM

## 2020-05-14 DIAGNOSIS — E785 Hyperlipidemia, unspecified: Secondary | ICD-10-CM | POA: Diagnosis present

## 2020-05-14 DIAGNOSIS — Z880 Allergy status to penicillin: Secondary | ICD-10-CM

## 2020-05-14 DIAGNOSIS — E11628 Type 2 diabetes mellitus with other skin complications: Secondary | ICD-10-CM | POA: Diagnosis present

## 2020-05-14 DIAGNOSIS — A419 Sepsis, unspecified organism: Principal | ICD-10-CM

## 2020-05-14 DIAGNOSIS — I10 Essential (primary) hypertension: Secondary | ICD-10-CM | POA: Diagnosis present

## 2020-05-14 DIAGNOSIS — L089 Local infection of the skin and subcutaneous tissue, unspecified: Secondary | ICD-10-CM

## 2020-05-14 DIAGNOSIS — Z9049 Acquired absence of other specified parts of digestive tract: Secondary | ICD-10-CM

## 2020-05-14 DIAGNOSIS — G8929 Other chronic pain: Secondary | ICD-10-CM | POA: Diagnosis present

## 2020-05-14 DIAGNOSIS — Z79899 Other long term (current) drug therapy: Secondary | ICD-10-CM

## 2020-05-14 DIAGNOSIS — Z886 Allergy status to analgesic agent status: Secondary | ICD-10-CM

## 2020-05-14 DIAGNOSIS — L02612 Cutaneous abscess of left foot: Secondary | ICD-10-CM

## 2020-05-14 DIAGNOSIS — L0390 Cellulitis, unspecified: Secondary | ICD-10-CM | POA: Diagnosis present

## 2020-05-14 LAB — COMPREHENSIVE METABOLIC PANEL
ALT: 10 U/L (ref 0–44)
AST: 19 U/L (ref 15–41)
Albumin: 2.9 g/dL — ABNORMAL LOW (ref 3.5–5.0)
Alkaline Phosphatase: 81 U/L (ref 38–126)
Anion gap: 11 (ref 5–15)
BUN: 5 mg/dL — ABNORMAL LOW (ref 6–20)
CO2: 24 mmol/L (ref 22–32)
Calcium: 8.6 mg/dL — ABNORMAL LOW (ref 8.9–10.3)
Chloride: 91 mmol/L — ABNORMAL LOW (ref 98–111)
Creatinine, Ser: 0.62 mg/dL (ref 0.61–1.24)
GFR, Estimated: >60 mL/min (ref >60)
Glucose, Bld: 271 mg/dL — ABNORMAL HIGH (ref 70–99)
Potassium: 3.9 mmol/L (ref 3.5–5.1)
Sodium: 126 mmol/L — ABNORMAL LOW (ref 135–145)
Total Bilirubin: 0.9 mg/dL (ref 0.3–1.2)
Total Protein: 7.3 g/dL (ref 6.5–8.1)

## 2020-05-14 LAB — CBC WITH DIFFERENTIAL/PLATELET
Abs Immature Granulocytes: 0.11 10*3/uL — ABNORMAL HIGH (ref 0.00–0.07)
Basophils Absolute: 0 10*3/uL (ref 0.0–0.1)
Basophils Relative: 0 %
Eosinophils Absolute: 0.1 10*3/uL (ref 0.0–0.5)
Eosinophils Relative: 0 %
HCT: 31.7 % — ABNORMAL LOW (ref 39.0–52.0)
Hemoglobin: 9.9 g/dL — ABNORMAL LOW (ref 13.0–17.0)
Immature Granulocytes: 1 %
Lymphocytes Relative: 4 %
Lymphs Abs: 0.7 10*3/uL (ref 0.7–4.0)
MCH: 26.7 pg (ref 26.0–34.0)
MCHC: 31.2 g/dL (ref 30.0–36.0)
MCV: 85.4 fL (ref 80.0–100.0)
Monocytes Absolute: 0.7 10*3/uL (ref 0.1–1.0)
Monocytes Relative: 4 %
Neutro Abs: 16.5 10*3/uL — ABNORMAL HIGH (ref 1.7–7.7)
Neutrophils Relative %: 91 %
Platelets: 296 10*3/uL (ref 150–400)
RBC: 3.71 MIL/uL — ABNORMAL LOW (ref 4.22–5.81)
RDW: 14.4 % (ref 11.5–15.5)
WBC: 18.1 10*3/uL — ABNORMAL HIGH (ref 4.0–10.5)
nRBC: 0 % (ref 0.0–0.2)

## 2020-05-14 LAB — LACTIC ACID, PLASMA: Lactic Acid, Venous: 1 mmol/L (ref 0.5–1.9)

## 2020-05-14 NOTE — Progress Notes (Signed)
Office Visit Note   Patient: [name]           Date of Birth: Nov 23, 1965           MRN: [id number] Visit Date: 05/14/2020              Requested by: Abran Richard, MD [address] [place],  [place] [address] PCP: Abran Richard, MD  Chief Complaint  Patient presents with  . Left Leg - Follow-up    12/13/19 left BKA   . Right Foot - Follow-up    Ulcer       HPI: Patient is a 55 year old gentleman who is status post a left transtibial amputation is currently ambulating his prosthesis.  Patient states he has been having increasing cellulitis swelling pain and drainage from the right foot.  He states that the third toe looks like it is burned with the skin peeled off the third toe with a chronic draining ulcer beneath the second and third metatarsal heads.  Assessment & Plan: Visit Diagnoses:  1. Right foot ulcer, limited to breakdown of skin ([place])   2. Subacute osteomyelitis, right ankle and foot (HCC)   3. Cellulitis and abscess of toe of right foot     Plan: Patient appears septic he has a heart rate greater than 130 hypertension O2 sats in the low 90s.  We are sending the patient to the emergency room through EMS he will need a medical work-up IV antibiotics and anticipate proceeding with a transmetatarsal amputation tomorrow Wednesday.  Risks and benefits of surgery were discussed including potential need for higher level amputation.  Patient states he understands wished to proceed at this time.  CBC and c-Met were drawn in the office prior to knowing patient's vital signs.  Follow-Up Instructions: No follow-ups on file.   Ortho Exam  Patient is alert, oriented, no adenopathy, well-dressed, normal affect, normal respiratory effort. Examination patient is a stable left transtibial amputation examination the right foot he has a large ulcer beneath the right second and third metatarsal head the ulcer is 5 cm in diameter and probes to bone.  There is blistering  cellulitis of the third toe with cellulitis extending into the midfoot.  There is no cellulitis in the calf.  Patient has a strong palpable dorsalis pedis pulse with a heart rate greater than 130.  Patient states that his blood sugars have been running in the high 200s.  Imaging: No results found. No images are attached to the encounter.  Labs: Lab Results  Component Value Date   HGBA1C 11.4 (H) 12/13/2019   HGBA1C 9.7 (H) 11/09/2018   REPTSTATUS 07/14/2017 FINAL 07/09/2017   REPTSTATUS 07/14/2017 FINAL 07/09/2017   CULT  07/09/2017    NO GROWTH 5 DAYS Performed at [facility], [address] [place], [place] [address]    CULT  07/09/2017    NO GROWTH 5 DAYS Performed at [facility], [address], [place], [place] [address]      Lab Results  Component Value Date   ALBUMIN 2.9 (L) 12/13/2019   ALBUMIN 2.5 (L) 08/31/2019   ALBUMIN 3.8 05/03/2019    Lab Results  Component Value Date   MG 1.4 (L) 07/11/2017   No results found for: VD25OH  No results found for: PREALBUMIN CBC EXTENDED Latest Ref Rng & Units 12/13/2019 08/31/2019 05/03/2019  WBC 4.0 - 10.5 K/uL 7.5 8.0 10.1  RBC 4.22 - 5.81 MIL/uL 3.66(L) 3.71(L) 4.09(L)  HGB 13.0 - 17.0 g/dL  10.1(L) 10.6(L) 12.9(L)  HCT 39.0 - 52.0 % 32.3(L) 33.8(L) 38.1(L)  PLT 150 - 400 K/uL 251 312 175  NEUTROABS 1.7 - 7.7 K/uL - 5.9 -  LYMPHSABS 0.7 - 4.0 K/uL - 1.4 -     There is no height or weight on file to calculate BMI.  Orders:  Orders Placed This Encounter  Procedures  . CBC with Differential  . Comprehensive Metabolic Panel (CMET)   No orders of the defined types were placed in this encounter.    Procedures: No procedures performed  Clinical Data: No additional findings.  ROS:  All other systems negative, except as noted in the HPI. Review of Systems  Objective: Vital Signs: BP (!) 158/76   Pulse (!) 133   Temp 98.7 F (37.1 C)   Specialty Comments:  No specialty comments available.  PMFS  History: Patient Active Problem List   Diagnosis Date Noted  . Acquired absence of left leg below knee ([place]) 12/29/2019  . Abscess of left foot 12/13/2019  . Diabetes mellitus type II, non insulin dependent ([place]) 07/09/2017  . Subacute osteomyelitis, left ankle and foot ([place]) 07/09/2017  . Hypokalemia 07/09/2017  . Hyponatremia 07/09/2017  . AKI (acute kidney injury) ([place]) 07/09/2017  . Symptomatic anemia 07/09/2017  . Essential hypertension 07/09/2017  . Depression with anxiety 07/09/2017  . Venous thrombosis 07/09/2017   Past Medical History:  Diagnosis Date  . Abscess of right foot   . ADHD   . Anemia   . Anxiety   . Arthritis   . Asthma   . COPD (chronic obstructive pulmonary disease) ([place])   . Diabetes mellitus   . Diabetic foot ulcer ([place]) 07/09/2017  . Diabetic ulcer of back associated with type 2 diabetes mellitus, limited to breakdown of skin ([place])   . Foot osteomyelitis, left ([place])   . GERD (gastroesophageal reflux disease)   . Gout   . History of blood transfusion   . Hypertension   . Myocardial infarction [facility])    ? 2008,  states he did not have a cath done and never saw a cardiologist  . Neuropathy   . Pancreatitis   . Stroke [facility])    TIA- no residual  . Wears glasses     History reviewed. No pertinent family history.  Past Surgical History:  Procedure Laterality Date  . AMPUTATION Left 11/09/2018   Procedure: LEFT TRANSMETATARSAL AMPUTATION;  Surgeon: Newt Minion, MD;  Location: [place];  Service: Orthopedics;  Laterality: Left;  . AMPUTATION Left 12/13/2019   Procedure: LEFT BELOW KNEE AMPUTATION;  Surgeon: Newt Minion, MD;  Location: [place];  Service: Orthopedics;  Laterality: Left;  . APPENDECTOMY    . FOOT SURGERY Left   . LAPAROSCOPIC APPENDECTOMY  07/28/2011   Procedure: APPENDECTOMY LAPAROSCOPIC;  Surgeon: Donato Heinz, MD;  Location: [facility];  Service: General;  Laterality: N/A;  . LEG SURGERY     Social History   Occupational History  .  Not on file  Tobacco Use  . Smoking status: Former Smoker    Types: Cigarettes  . Smokeless tobacco: Never Used  . Tobacco comment: quit smoking cigarettes 27 years ago 09/05/19  Vaping Use  . Vaping Use: Never used  Substance and Sexual Activity  . Alcohol use: Yes    Comment: 12/13/19- 2 40oz beer a week  . Drug use: Yes    Types: Marijuana    Comment: CBD every 3 days  . Sexual activity: Not on file

## 2020-05-14 NOTE — Progress Notes (Signed)
PRE- OP  INSTRUCTIONS:  Your procedure is scheduled on 05/15/20. Please report to [facility] Main Entrance "A" at 05:30 A.M., and check in at the Admitting office. Call this number if you have problems the morning of surgery: [phone]   Remember: Do not eat after midnight the night before your surgery  You may drink clear liquids until 04:30 AM the morning of your surgery.   Clear liquids allowed are: Water, Non-Citrus Juices (without pulp), Carbonated Beverages, Clear Tea, Black Coffee Only, and Gatorade   Medications to take morning of surgery with a sip of water include: Inhaler Amlodipine (Norvasc)  Doxycycline Gabapentin Robaxin - as needed Omeprazole (Prilosec) Pravastatin  Humulin N - ONLY take half of usual dose   DO NOT TAKE ANY ORAL DIABETIC MEDICATIONS MORNING OR SURGERY!!  ** PLEASE check your blood sugar the morning of your surgery when you wake up and every 2 hours until you get to the Short Stay unit.  If your blood sugar is less than 70 mg/dL, you will need to treat for low blood sugar: - Do not take insulin. - Treat a low blood sugar (less than 70 mg/dL) with  cup of clear juice (cranberry or apple), 4 glucose tablets, OR glucose gel. - Recheck blood sugar in 15 minutes after treatment (to make sure it is greater than 70 mg/dL). If your blood sugar is not greater than 70 mg/dL on recheck, call [phone] for further instructions.   As of today, STOP taking any Aspirin (unless otherwise instructed by your surgeon), Aleve, Naproxen, Ibuprofen, Motrin, Advil, Goody's, BC's, all herbal medications, fish oil, and all vitamins.    The Morning of Surgery Do not wear jewelry Do not wear lotions, powders, colognes, or deodorant Men may shave face and neck. Do not bring valuables to the hospital. [facility] is not responsible for any belongings or valuables. If you are a smoker, DO NOT Smoke 24 hours prior to surgery If you wear a CPAP at night please bring your  mask the morning of surgery  Remember that you must have someone to transport you home after your surgery, and remain with you for 24 hours if you are discharged the same day. Please bring cases for contacts, glasses, hearing aids, dentures or bridgework because it cannot be worn into surgery.   Patients discharged the day of surgery will not be allowed to drive home.   Please shower the NIGHT BEFORE SURGERY and the MORNING OF SURGERY with DIAL Soap. Wear comfortable clothes the morning of surgery. Oral Hygiene is also important to reduce your risk of infection.  Remember - BRUSH YOUR TEETH THE MORNING OF SURGERY WITH YOUR REGULAR TOOTHPASTE  Patient denies shortness of breath, fever, cough and chest pain.

## 2020-05-14 NOTE — Progress Notes (Unsigned)
Office Visit Note   Patient: [name]           Date of Birth: 24-Jan-1966           MRN: [id number] Visit Date: 05/14/2020              Requested by: No referring provider defined for this encounter. PCP: Alvina Filbert, MD  No chief complaint on file.     HPI: ***  Assessment & Plan: Visit Diagnoses: No diagnosis found.  Plan: ***  Follow-Up Instructions: No follow-ups on file.   Ortho Exam  Patient is alert, oriented, no adenopathy, well-dressed, normal affect, normal respiratory effort. ***  Imaging: No results found. No images are attached to the encounter.  Labs: Lab Results  Component Value Date   HGBA1C 11.4 (H) 12/13/2019   HGBA1C 9.7 (H) 11/09/2018   REPTSTATUS 07/14/2017 FINAL 07/09/2017   REPTSTATUS 07/14/2017 FINAL 07/09/2017   CULT  07/09/2017    NO GROWTH 5 DAYS Performed at [facility], [address], [place], [place] [address]    CULT  07/09/2017    NO GROWTH 5 DAYS Performed at [facility], [address], [place], [place] [address]      Lab Results  Component Value Date   ALBUMIN 2.9 (L) 12/13/2019   ALBUMIN 2.5 (L) 08/31/2019   ALBUMIN 3.8 05/03/2019    Lab Results  Component Value Date   MG 1.4 (L) 07/11/2017   No results found for: VD25OH  No results found for: PREALBUMIN CBC EXTENDED Latest Ref Rng & Units 12/13/2019 08/31/2019 05/03/2019  WBC 4.0 - 10.5 K/uL 7.5 8.0 10.1  RBC 4.22 - 5.81 MIL/uL 3.66(L) 3.71(L) 4.09(L)  HGB 13.0 - 17.0 g/dL 10.1(L) 10.6(L) 12.9(L)  HCT 39.0 - 52.0 % 32.3(L) 33.8(L) 38.1(L)  PLT 150 - 400 K/uL 251 312 175  NEUTROABS 1.7 - 7.7 K/uL - 5.9 -  LYMPHSABS 0.7 - 4.0 K/uL - 1.4 -     There is no height or weight on file to calculate BMI.  Orders:  No orders of the defined types were placed in this encounter.  No orders of the defined types were placed in this encounter.    Procedures: No procedures performed  Clinical Data: No additional findings.  ROS:  All other systems  negative, except as noted in the HPI. Review of Systems  Objective: Vital Signs: There were no vitals taken for this visit.  Specialty Comments:  No specialty comments available.  PMFS History: Patient Active Problem List   Diagnosis Date Noted  . Acquired absence of left leg below knee (HCC) 12/29/2019  . Abscess of left foot 12/13/2019  . Diabetes mellitus type II, non insulin dependent (HCC) 07/09/2017  . Subacute osteomyelitis, left ankle and foot (HCC) 07/09/2017  . Hypokalemia 07/09/2017  . Hyponatremia 07/09/2017  . AKI (acute kidney injury) (HCC) 07/09/2017  . Symptomatic anemia 07/09/2017  . Essential hypertension 07/09/2017  . Depression with anxiety 07/09/2017  . Venous thrombosis 07/09/2017   Past Medical History:  Diagnosis Date  . Abscess of right foot   . ADHD   . Anemia   . Anxiety   . Arthritis   . Asthma   . COPD (chronic obstructive pulmonary disease) (HCC)   . Diabetes mellitus   . Diabetic foot ulcer (HCC) 07/09/2017  . 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 Foot osteomyelitis, left (HCC)   . GERD (gastroesophageal reflux  disease)   . Gout   . History of blood transfusion   . Hypertension   . Myocardial infarction [facility])    ? 2008,  states he did not have a cath done and never saw a cardiologist  . Neuropathy   . Pancreatitis   . Stroke [facility])    TIA- no residual  . Wears glasses     No family history on file.  Past Surgical History:  Procedure Laterality Date  . AMPUTATION Left 11/09/2018   Procedure: LEFT TRANSMETATARSAL AMPUTATION;  Surgeon: Nadara Mustard, MD;  Location: [facility];  Service: Orthopedics;  Laterality: Left;  . AMPUTATION Left 12/13/2019   Procedure: LEFT BELOW KNEE AMPUTATION;  Surgeon: Nadara Mustard, MD;  Location: [facility] OR;  Service: Orthopedics;  Laterality: Left;  . APPENDECTOMY    . FOOT SURGERY Left   . LAPAROSCOPIC APPENDECTOMY  07/28/2011   Procedure:  APPENDECTOMY LAPAROSCOPIC;  Surgeon: Fabio Bering, MD;  Location: [facility];  Service: General;  Laterality: N/A;  . LEG SURGERY     Social History   Occupational History  . Not on file  Tobacco Use  . Smoking status: Former Smoker    Types: Cigarettes  . Smokeless tobacco: Never Used  . Tobacco comment: quit smoking cigarettes 27 years ago 09/05/19  Vaping Use  . Vaping Use: Never used  Substance and Sexual Activity  . Alcohol use: Yes    Comment: 12/13/19- 2 40oz beer a week  . Drug use: Yes    Types: Marijuana    Comment: CBD every 3 days  . Sexual activity: Not on file

## 2020-05-14 NOTE — Progress Notes (Signed)
Spoke with ED tech. Per tech, pt is currently being triaged, unsure if pt will be admitted or discharged. Informed ED tech to let nurse caring for pt know that pre-op instructions are filed and to print for pt if pt is sent home. ED tech verbalized understanding and will pass on information.

## 2020-05-14 NOTE — ED Triage Notes (Signed)
Pt arrived by [facility] from orthopedic office. Pt has hx of HTN and non complaint with diabetes. Scheduled for amputation of right foot tomorrow but went for appt and sent here for antibiotics first due to ulcer on foot. cbg [phone].

## 2020-05-15 ENCOUNTER — Inpatient Hospital Stay (HOSPITAL_COMMUNITY): Payer: Medicaid Other | Admitting: Registered Nurse

## 2020-05-15 ENCOUNTER — Ambulatory Visit (HOSPITAL_COMMUNITY): Admission: RE | Admit: 2020-05-15 | Payer: Medicaid Other | Source: Home / Self Care | Admitting: Orthopedic Surgery

## 2020-05-15 ENCOUNTER — Encounter (HOSPITAL_COMMUNITY)
Admission: EM | Disposition: A | Discharge status: Home / Self Care | Payer: Self-pay | Source: Home / Self Care | Attending: Internal Medicine

## 2020-05-15 ENCOUNTER — Encounter (HOSPITAL_COMMUNITY): Payer: Self-pay | Admitting: Internal Medicine

## 2020-05-15 DIAGNOSIS — K219 Gastro-esophageal reflux disease without esophagitis: Secondary | ICD-10-CM | POA: Diagnosis present

## 2020-05-15 DIAGNOSIS — E11628 Type 2 diabetes mellitus with other skin complications: Secondary | ICD-10-CM

## 2020-05-15 DIAGNOSIS — Z794 Long term (current) use of insulin: Secondary | ICD-10-CM | POA: Diagnosis not present

## 2020-05-15 DIAGNOSIS — L97514 Non-pressure chronic ulcer of other part of right foot with necrosis of bone: Secondary | ICD-10-CM | POA: Diagnosis not present

## 2020-05-15 DIAGNOSIS — J449 Chronic obstructive pulmonary disease, unspecified: Secondary | ICD-10-CM

## 2020-05-15 DIAGNOSIS — L97509 Non-pressure chronic ulcer of other part of unspecified foot with unspecified severity: Secondary | ICD-10-CM | POA: Diagnosis not present

## 2020-05-15 DIAGNOSIS — A419 Sepsis, unspecified organism: Secondary | ICD-10-CM

## 2020-05-15 DIAGNOSIS — M869 Osteomyelitis, unspecified: Secondary | ICD-10-CM | POA: Diagnosis present

## 2020-05-15 DIAGNOSIS — M109 Gout, unspecified: Secondary | ICD-10-CM | POA: Diagnosis present

## 2020-05-15 DIAGNOSIS — I10 Essential (primary) hypertension: Secondary | ICD-10-CM | POA: Diagnosis present

## 2020-05-15 DIAGNOSIS — Z886 Allergy status to analgesic agent status: Secondary | ICD-10-CM | POA: Diagnosis not present

## 2020-05-15 DIAGNOSIS — Z20822 Contact with and (suspected) exposure to covid-19: Secondary | ICD-10-CM | POA: Diagnosis present

## 2020-05-15 DIAGNOSIS — L0390 Cellulitis, unspecified: Secondary | ICD-10-CM | POA: Diagnosis present

## 2020-05-15 DIAGNOSIS — Z8673 Personal history of transient ischemic attack (TIA), and cerebral infarction without residual deficits: Secondary | ICD-10-CM | POA: Diagnosis not present

## 2020-05-15 DIAGNOSIS — Z7982 Long term (current) use of aspirin: Secondary | ICD-10-CM | POA: Diagnosis not present

## 2020-05-15 DIAGNOSIS — I252 Old myocardial infarction: Secondary | ICD-10-CM | POA: Diagnosis not present

## 2020-05-15 DIAGNOSIS — Z880 Allergy status to penicillin: Secondary | ICD-10-CM | POA: Diagnosis not present

## 2020-05-15 DIAGNOSIS — L089 Local infection of the skin and subcutaneous tissue, unspecified: Secondary | ICD-10-CM

## 2020-05-15 DIAGNOSIS — F909 Attention-deficit hyperactivity disorder, unspecified type: Secondary | ICD-10-CM | POA: Diagnosis present

## 2020-05-15 DIAGNOSIS — E11621 Type 2 diabetes mellitus with foot ulcer: Secondary | ICD-10-CM | POA: Diagnosis present

## 2020-05-15 DIAGNOSIS — Z87891 Personal history of nicotine dependence: Secondary | ICD-10-CM | POA: Diagnosis not present

## 2020-05-15 DIAGNOSIS — Z89512 Acquired absence of left leg below knee: Secondary | ICD-10-CM | POA: Diagnosis not present

## 2020-05-15 DIAGNOSIS — L97519 Non-pressure chronic ulcer of other part of right foot with unspecified severity: Secondary | ICD-10-CM

## 2020-05-15 DIAGNOSIS — Z89422 Acquired absence of other left toe(s): Secondary | ICD-10-CM | POA: Diagnosis not present

## 2020-05-15 DIAGNOSIS — E871 Hypo-osmolality and hyponatremia: Secondary | ICD-10-CM | POA: Diagnosis present

## 2020-05-15 DIAGNOSIS — E1169 Type 2 diabetes mellitus with other specified complication: Secondary | ICD-10-CM | POA: Diagnosis present

## 2020-05-15 DIAGNOSIS — Z79899 Other long term (current) drug therapy: Secondary | ICD-10-CM | POA: Diagnosis not present

## 2020-05-15 DIAGNOSIS — F419 Anxiety disorder, unspecified: Secondary | ICD-10-CM | POA: Diagnosis present

## 2020-05-15 HISTORY — PX: AMPUTATION: SHX166

## 2020-05-15 LAB — SARS CORONAVIRUS 2 (TAT 6-24 HRS): SARS Coronavirus 2: NEGATIVE

## 2020-05-15 LAB — GLUCOSE, CAPILLARY
Glucose-Capillary: 203 mg/dL — ABNORMAL HIGH (ref 70–99)
Glucose-Capillary: 204 mg/dL — ABNORMAL HIGH (ref 70–99)
Glucose-Capillary: 286 mg/dL — ABNORMAL HIGH (ref 70–99)
Glucose-Capillary: 114 mg/dL — ABNORMAL HIGH (ref 70–99)

## 2020-05-15 LAB — CBC WITH DIFFERENTIAL/PLATELET
Absolute Monocytes: 744 cells/uL (ref 200–950)
Basophils Absolute: 20 cells/uL (ref 0–200)
Basophils Relative: 0.1 %
Eosinophils Absolute: 121 cells/uL (ref 15–500)
Eosinophils Relative: 0.6 %
HCT: 30.9 % — ABNORMAL LOW (ref 38.5–50.0)
Hemoglobin: 10.2 g/dL — ABNORMAL LOW (ref 13.2–17.1)
Lymphs Abs: 1166 cells/uL (ref 850–3900)
MCH: 27.1 pg (ref 27.0–33.0)
MCHC: 33 g/dL (ref 32.0–36.0)
MCV: 82 fL (ref 80.0–100.0)
MPV: 9.4 fL (ref 7.5–12.5)
Monocytes Relative: 3.7 %
Neutro Abs: 18050 cells/uL — ABNORMAL HIGH (ref 1500–7800)
Neutrophils Relative %: 89.8 %
Platelets: 336 10*3/uL (ref 140–400)
RBC: 3.77 10*6/uL — ABNORMAL LOW (ref 4.20–5.80)
RDW: 14.1 % (ref 11.0–15.0)
Total Lymphocyte: 5.8 %
WBC: 20.1 10*3/uL — ABNORMAL HIGH (ref 3.8–10.8)

## 2020-05-15 LAB — COMPREHENSIVE METABOLIC PANEL
AG Ratio: 1 (calc) (ref 1.0–2.5)
ALT: 8 U/L — ABNORMAL LOW (ref 9–46)
AST: 17 U/L (ref 10–35)
Albumin: 3.6 g/dL (ref 3.6–5.1)
Alkaline phosphatase (APISO): 92 U/L (ref 35–144)
BUN/Creatinine Ratio: 12 (calc) (ref 6–22)
BUN: 6 mg/dL — ABNORMAL LOW (ref 7–25)
CO2: 25 mmol/L (ref 20–32)
Calcium: 9.1 mg/dL (ref 8.6–10.3)
Chloride: 91 mmol/L — ABNORMAL LOW (ref 98–110)
Creat: 0.52 mg/dL — ABNORMAL LOW (ref 0.70–1.33)
Globulin: 3.5 g/dL (calc) (ref 1.9–3.7)
Glucose, Bld: 240 mg/dL — ABNORMAL HIGH (ref 65–99)
Potassium: 4.1 mmol/L (ref 3.5–5.3)
Sodium: 127 mmol/L — ABNORMAL LOW (ref 135–146)
Total Bilirubin: 0.8 mg/dL (ref 0.2–1.2)
Total Protein: 7.1 g/dL (ref 6.1–8.1)

## 2020-05-15 LAB — HEMOGLOBIN A1C
Hgb A1c MFr Bld: 9.7 % — ABNORMAL HIGH (ref 4.8–5.6)
Mean Plasma Glucose: 231.69 mg/dL

## 2020-05-15 LAB — HIV ANTIBODY (ROUTINE TESTING W REFLEX): HIV Screen 4th Generation wRfx: NONREACTIVE

## 2020-05-15 LAB — OSMOLALITY: Osmolality: 290 mOsm/kg (ref 275–295)

## 2020-05-15 SURGERY — AMPUTATION, FOOT, RAY
Anesthesia: Monitor Anesthesia Care | Laterality: Right

## 2020-05-15 MED ORDER — BUPIVACAINE-EPINEPHRINE (PF) 0.5% -1:200000 IJ SOLN
INTRAMUSCULAR | Status: DC | PRN
  Administered 2020-05-15: 25 mL via PERINEURAL

## 2020-05-15 MED ORDER — PANTOPRAZOLE SODIUM 40 MG PO TBEC
40.0000 mg | DELAYED_RELEASE_TABLET | Freq: Every day | ORAL | Status: DC
  Administered 2020-05-16 – 2020-05-17 (×2): 40 mg via ORAL
  Filled 2020-05-15 (×2): qty 1

## 2020-05-15 MED ORDER — IPRATROPIUM-ALBUTEROL 0.5-2.5 (3) MG/3ML IN SOLN: 3.0000 mL | Freq: Four times a day (QID) | RESPIRATORY_TRACT | Status: DC | PRN

## 2020-05-15 MED ORDER — INSULIN ASPART 100 UNIT/ML ~~LOC~~ SOLN
0.0000 [IU] | SUBCUTANEOUS | Status: DC
  Administered 2020-05-15: 3 [IU] via SUBCUTANEOUS

## 2020-05-15 MED ORDER — OXYCODONE HCL 5 MG/5ML PO SOLN
5.0000 mg | Freq: Once | ORAL | Status: DC | PRN
Start: 2020-05-15 — End: 2020-05-15

## 2020-05-15 MED ORDER — SODIUM CHLORIDE 0.9 % IV BOLUS
500.0000 mL | Freq: Once | INTRAVENOUS | Status: AC
  Administered 2020-05-15: 500 mL via INTRAVENOUS

## 2020-05-15 MED ORDER — SODIUM CHLORIDE 0.9 % IV BOLUS
1000.0000 mL | Freq: Once | INTRAVENOUS | Status: AC
  Administered 2020-05-15: 1000 mL via INTRAVENOUS

## 2020-05-15 MED ORDER — INSULIN ASPART 100 UNIT/ML ~~LOC~~ SOLN
0.0000 [IU] | Freq: Three times a day (TID) | SUBCUTANEOUS | Status: DC
  Administered 2020-05-15: 5 [IU] via SUBCUTANEOUS
  Administered 2020-05-15: 8 [IU] via SUBCUTANEOUS
  Administered 2020-05-16: 2 [IU] via SUBCUTANEOUS
  Administered 2020-05-16: 8 [IU] via SUBCUTANEOUS
  Administered 2020-05-16 – 2020-05-17 (×2): 5 [IU] via SUBCUTANEOUS
  Administered 2020-05-17: 3 [IU] via SUBCUTANEOUS

## 2020-05-15 MED ORDER — SENNOSIDES-DOCUSATE SODIUM 8.6-50 MG PO TABS: 1.0000 | ORAL_TABLET | Freq: Every evening | ORAL | Status: DC | PRN

## 2020-05-15 MED ORDER — OXYCODONE HCL ER 10 MG PO T12A
10.0000 mg | EXTENDED_RELEASE_TABLET | Freq: Two times a day (BID) | ORAL | Status: DC
  Administered 2020-05-15 – 2020-05-17 (×4): 10 mg via ORAL
  Filled 2020-05-15 (×4): qty 1

## 2020-05-15 MED ORDER — FENTANYL CITRATE (PF) 100 MCG/2ML IJ SOLN
50.0000 ug | Freq: Once | INTRAMUSCULAR | Status: DC
  Filled 2020-05-15: qty 2

## 2020-05-15 MED ORDER — VANCOMYCIN HCL 10 G IV SOLR
2500.0000 mg | Freq: Once | INTRAVENOUS | Status: AC
  Administered 2020-05-15: 2500 mg via INTRAVENOUS
  Filled 2020-05-15: qty 2500

## 2020-05-15 MED ORDER — PIPERACILLIN-TAZOBACTAM 3.375 G IVPB
3.3750 g | Freq: Three times a day (TID) | INTRAVENOUS | Status: DC
  Administered 2020-05-15 – 2020-05-17 (×6): 3.375 g via INTRAVENOUS
  Filled 2020-05-15 (×6): qty 50

## 2020-05-15 MED ORDER — MORPHINE SULFATE (PF) 2 MG/ML IV SOLN
2.0000 mg | INTRAVENOUS | Status: DC | PRN
  Filled 2020-05-15: qty 1

## 2020-05-15 MED ORDER — AMITRIPTYLINE HCL 50 MG PO TABS
150.0000 mg | ORAL_TABLET | Freq: Every day | ORAL | Status: DC
  Administered 2020-05-15 – 2020-05-16 (×2): 150 mg via ORAL
  Filled 2020-05-15 (×2): qty 3

## 2020-05-15 MED ORDER — ACETAMINOPHEN 325 MG PO TABS: 650.0000 mg | ORAL_TABLET | Freq: Four times a day (QID) | ORAL | Status: DC | PRN

## 2020-05-15 MED ORDER — MIDAZOLAM HCL 2 MG/2ML IJ SOLN
INTRAMUSCULAR | Status: DC | PRN
  Administered 2020-05-15: 2 mg via INTRAVENOUS

## 2020-05-15 MED ORDER — OXYCODONE HCL 5 MG PO TABS
10.0000 mg | ORAL_TABLET | ORAL | Status: DC | PRN
  Administered 2020-05-15 – 2020-05-17 (×8): 10 mg via ORAL
  Filled 2020-05-15 (×8): qty 2

## 2020-05-15 MED ORDER — SODIUM CHLORIDE 0.9 % IV SOLN: INTRAVENOUS | Status: AC

## 2020-05-15 MED ORDER — DM-GUAIFENESIN ER 30-600 MG PO TB12: 1.0000 | ORAL_TABLET | Freq: Two times a day (BID) | ORAL | Status: DC | PRN

## 2020-05-15 MED ORDER — 0.9 % SODIUM CHLORIDE (POUR BTL) OPTIME
TOPICAL | Status: DC | PRN
  Administered 2020-05-15: 1000 mL

## 2020-05-15 MED ORDER — IPRATROPIUM-ALBUTEROL 0.5-2.5 (3) MG/3ML IN SOLN: 3.0000 mL | RESPIRATORY_TRACT | Status: DC | PRN

## 2020-05-15 MED ORDER — LACTATED RINGERS IV SOLN: INTRAVENOUS | Status: DC | PRN

## 2020-05-15 MED ORDER — FENTANYL CITRATE (PF) 100 MCG/2ML IJ SOLN: 25.0000 ug | INTRAMUSCULAR | Status: DC | PRN

## 2020-05-15 MED ORDER — INSULIN ASPART 100 UNIT/ML ~~LOC~~ SOLN
3.0000 [IU] | Freq: Three times a day (TID) | SUBCUTANEOUS | Status: DC
  Administered 2020-05-15 – 2020-05-17 (×6): 3 [IU] via SUBCUTANEOUS

## 2020-05-15 MED ORDER — OXYCODONE HCL 5 MG PO TABS
5.0000 mg | ORAL_TABLET | Freq: Once | ORAL | Status: DC | PRN
  Administered 2020-05-15: 5 mg via ORAL

## 2020-05-15 MED ORDER — SODIUM CHLORIDE 0.9 % IV SOLN: INTRAVENOUS | Status: DC

## 2020-05-15 MED ORDER — PROPOFOL 10 MG/ML IV BOLUS
INTRAVENOUS | Status: DC | PRN
  Administered 2020-05-15 (×4): 50 mg via INTRAVENOUS

## 2020-05-15 MED ORDER — INSULIN ASPART 100 UNIT/ML ~~LOC~~ SOLN
0.0000 [IU] | Freq: Every day | SUBCUTANEOUS | Status: DC
  Administered 2020-05-16: 2 [IU] via SUBCUTANEOUS

## 2020-05-15 MED ORDER — ONDANSETRON HCL 4 MG/2ML IJ SOLN: 4.0000 mg | Freq: Four times a day (QID) | INTRAMUSCULAR | Status: DC | PRN

## 2020-05-15 MED ORDER — LIDOCAINE HCL (CARDIAC) PF 100 MG/5ML IV SOSY
PREFILLED_SYRINGE | INTRAVENOUS | Status: DC | PRN
  Administered 2020-05-15: 100 mg via INTRAVENOUS

## 2020-05-15 MED ORDER — IPRATROPIUM-ALBUTEROL 20-100 MCG/ACT IN AERS: 1.0000 | INHALATION_SPRAY | Freq: Four times a day (QID) | RESPIRATORY_TRACT | Status: DC | PRN

## 2020-05-15 MED ORDER — GABAPENTIN 600 MG PO TABS
1200.0000 mg | ORAL_TABLET | Freq: Three times a day (TID) | ORAL | Status: DC
  Administered 2020-05-15 – 2020-05-17 (×7): 1200 mg via ORAL
  Filled 2020-05-15 (×8): qty 2

## 2020-05-15 MED ORDER — PRAVASTATIN SODIUM 10 MG PO TABS
20.0000 mg | ORAL_TABLET | Freq: Every day | ORAL | Status: DC
  Administered 2020-05-15 – 2020-05-16 (×2): 20 mg via ORAL
  Filled 2020-05-15 (×3): qty 2

## 2020-05-15 SURGICAL SUPPLY — 29 items
BLADE SAW SGTL MED 73X18.5 STR (BLADE) ×2 IMPLANT
BLADE SURG 21 STRL SS (BLADE) ×2 IMPLANT
BNDG COHESIVE 4X5 TAN STRL (GAUZE/BANDAGES/DRESSINGS) ×2 IMPLANT
BNDG GAUZE ELAST 4 BULKY (GAUZE/BANDAGES/DRESSINGS) IMPLANT
COVER SURGICAL LIGHT HANDLE (MISCELLANEOUS) ×2 IMPLANT
DRAPE DERMATAC (DRAPES) ×2 IMPLANT
DRAPE U-SHAPE 47X51 STRL (DRAPES) ×4 IMPLANT
DRSG ADAPTIC 3X8 NADH LF (GAUZE/BANDAGES/DRESSINGS) IMPLANT
DRSG PAD ABDOMINAL 8X10 ST (GAUZE/BANDAGES/DRESSINGS) IMPLANT
DURAPREP 26ML APPLICATOR (WOUND CARE) ×2 IMPLANT
ELECT REM PT RETURN 9FT ADLT (ELECTROSURGICAL) ×2
ELECTRODE REM PT RTRN 9FT ADLT (ELECTROSURGICAL) ×1 IMPLANT
GAUZE SPONGE 4X4 12PLY STRL (GAUZE/BANDAGES/DRESSINGS) IMPLANT
GLOVE BIOGEL PI IND STRL 9 (GLOVE) ×1 IMPLANT
GLOVE BIOGEL PI INDICATOR 9 (GLOVE) ×1
GLOVE SURG ORTHO 9.0 STRL STRW (GLOVE) ×2 IMPLANT
GOWN STRL REUS W/ TWL XL LVL3 (GOWN DISPOSABLE) ×2 IMPLANT
GOWN STRL REUS W/TWL XL LVL3 (GOWN DISPOSABLE) ×4
KIT BASIN OR (CUSTOM PROCEDURE TRAY) ×2 IMPLANT
KIT PREVENA INCISION MGT 13 (CANNISTER) ×2 IMPLANT
KIT TURNOVER KIT B (KITS) ×2 IMPLANT
NS IRRIG 1000ML POUR BTL (IV SOLUTION) ×2 IMPLANT
PACK ORTHO EXTREMITY (CUSTOM PROCEDURE TRAY) ×2 IMPLANT
PAD ARMBOARD 7.5X6 YLW CONV (MISCELLANEOUS) ×4 IMPLANT
STOCKINETTE IMPERVIOUS LG (DRAPES) IMPLANT
SUT ETHILON 2 0 PSLX (SUTURE) ×4 IMPLANT
TOWEL GREEN STERILE (TOWEL DISPOSABLE) ×2 IMPLANT
TUBE CONNECTING 12X1/4 (SUCTIONS) ×2 IMPLANT
YANKAUER SUCT BULB TIP NO VENT (SUCTIONS) ×2 IMPLANT

## 2020-05-15 NOTE — Progress Notes (Signed)
   05/15/20 1546  Vitals  Temp 98.3 F (36.8 C)  Temp Source Oral  BP (!) 140/99  MAP (mmHg) 113  BP Location Right Arm  BP Method Automatic  Patient Position (if appropriate) Sitting  Pulse Rate (!) 116  Resp 18  MEWS COLOR  MEWS Score Color Yellow  Oxygen Therapy  SpO2 100 %  O2 Device Room Air  Pain Assessment  Pain Scale 0-10  Pain Score 6  MEWS Score  MEWS Temp 0  MEWS Systolic 0  MEWS Pulse 2  MEWS RR 0  MEWS LOC 0  MEWS Score 2  Provider Notification  Provider Name/Title Ankit Joline Maxcy  Date Provider Notified 05/15/20  Time Provider Notified 1607  Notification Type Page  Notification Reason Change in status  Response Other (Comment) (500 mL bolus if pulse continues near 120 in case of dehydration)  Date of Provider Response 05/15/20  Time of Provider Response 1630

## 2020-05-15 NOTE — Progress Notes (Signed)
   05/15/20 1400  Assess: MEWS Score  Level of Consciousness Alert  O2 Device Room Air  Assess: MEWS Score  MEWS Temp 0  MEWS Systolic 0  MEWS Pulse 2  MEWS RR 0  MEWS LOC 0  MEWS Score 2  MEWS Score Color Yellow  Assess: if the MEWS score is Yellow or Red  Were vital signs taken at a resting state? No  Focused Assessment No change from prior assessment  Early Detection of Sepsis Score *See Row Information* Low  MEWS guidelines implemented *See Row Information* No, other (Comment) (pain med administered and pt is just excited to talk to people)  Treat  MEWS Interventions Administered prn meds/treatments  Pain Scale 0-10  Pain Score 10  Pain Type Surgical pain  Pain Location Foot  Pain Orientation Right  Pain Descriptors / Indicators Throbbing  Pain Frequency Constant  Pain Onset On-going  Patients Stated Pain Goal 9  Pain Intervention(s) Medication (See eMAR)

## 2020-05-15 NOTE — Evaluation (Signed)
Physical Therapy Evaluation Patient Details Name: [name] MRN: [id number] DOB: Jun 29, 1965 Today's Date: 05/15/2020   History of Present Illness  55 y.o. male with medical history significant of COPD, asthma, insulin-dependent type II diabetes, hypertension, gout, GERD, CVA/TIA, history of left transtibial amputation, chronic right foot ulcer.  Underwent R TRANSMETATARSAL AMPUTATION with small wound vac in place.  NWB to RLE.  Clinical Impression  PTA, patient was modI with mobility and occasional use of a SPC. Patient requires frequent cueing for NWB with sit to stand transfer with RW, difficulty maintaining WB restrictions unless ambulating. Patient demonstrates impulsivity and decreased safety awareness with mobility. Educated on importance of slowing down for his safety as well as someone assisting him. Also educated on importance of maintaining WB restrictions to allow for appropriate healing. Patient presents with impaired balance, generalized weakness, and decreased activity tolerance. Patient will benefit from skilled PT services during acute stay to address listed deficits. Recommend HHPT following discharge to maximize functional mobility and safety.     Follow Up Recommendations Home health PT    Equipment Recommendations  Wheelchair (measurements PT);Wheelchair cushion (measurements PT)    Recommendations for Other Services       Precautions / Restrictions Precautions Precautions: Fall Precaution Comments: small wound vac, hx of L BKA with prosthetic Restrictions Weight Bearing Restrictions: Yes RLE Weight Bearing: Non weight bearing      Mobility  Bed Mobility Overal bed mobility: Modified Independent                  Transfers Overall transfer level: Needs assistance Equipment used: Rolling Suki Crockett (2 wheeled) Transfers: Sit to/from Stand Sit to Stand: Min guard Stand pivot transfers: Min guard       General transfer comment: cues for hand  placement, slowed pace and NWB on R as patient is impulsive to stand. Difficulty maintaining NWB on R LE  Ambulation/Gait Ambulation/Gait assistance: Min guard Gait Distance (Feet): 12 Feet Assistive device: Rolling Scharlene Catalina (2 wheeled) Gait Pattern/deviations:  (hop to pattern)     General Gait Details: cues for slowed pace and NWB on R. Better at maintaining NWB with ambulation than static standing  Stairs            Wheelchair Mobility    Modified Rankin (Stroke Patients Only)       Balance Overall balance assessment: Needs assistance Sitting-balance support: No upper extremity supported Sitting balance-Leahy Scale: Good     Standing balance support: Bilateral upper extremity supported Standing balance-Leahy Scale: Poor Standing balance comment: moves too fast.  Wound vac line is getting under his foot. Reliant on RW for support                             Pertinent Vitals/Pain Pain Assessment: No/denies pain    Home Living Family/patient expects to be discharged to:: Private residence Living Arrangements: Spouse/significant other Available Help at Discharge: Family;Available 24 hours/day Type of Home: House Home Access: Ramped entrance     Home Layout: One level Home Equipment: Akhila Mahnken - 2 wheels;Cane - single point;Bedside commode;Shower seat;Grab bars - tub/shower;Hand held shower head      Prior Function Level of Independence: Independent with assistive device(s)         Comments: used cane PTA, cared for his own ADL, drove on occasion.     Hand Dominance   Dominant Hand: Right    Extremity/Trunk Assessment   Upper Extremity Assessment Upper Extremity Assessment:  Defer to OT evaluation    Lower Extremity Assessment Lower Extremity Assessment: Generalized weakness    Cervical / Trunk Assessment Cervical / Trunk Assessment: Normal  Communication   Communication: No difficulties  Cognition Arousal/Alertness:  Awake/alert Behavior During Therapy: Restless;Impulsive Overall Cognitive Status: Within Functional Limits for tasks assessed                                        General Comments      Exercises     Assessment/Plan    PT Assessment Patient needs continued PT services  PT Problem List Decreased strength;Decreased activity tolerance;Decreased balance;Decreased mobility;Decreased safety awareness;Decreased knowledge of precautions       PT Treatment Interventions DME instruction;Gait training;Functional mobility training;Therapeutic activities;Therapeutic exercise;Balance training;Patient/family education    PT Goals (Current goals can be found in the Care Plan section)  Acute Rehab PT Goals Patient Stated Goal: hoping to get home by Friday PT Goal Formulation: With patient Time For Goal Achievement: 05/29/20 Potential to Achieve Goals: Good    Frequency Min 3X/week   Barriers to discharge        Co-evaluation               AM-PAC PT "6 Clicks" Mobility  Outcome Measure Help needed turning from your back to your side while in a flat bed without using bedrails?: None Help needed moving from lying on your back to sitting on the side of a flat bed without using bedrails?: None Help needed moving to and from a bed to a chair (including a wheelchair)?: A Little Help needed standing up from a chair using your arms (e.g., wheelchair or bedside chair)?: A Little Help needed to walk in hospital room?: A Little Help needed climbing 3-5 steps with a railing? : A Little 6 Click Score: 20    End of Session Equipment Utilized During Treatment: Gait belt Activity Tolerance: Patient limited by fatigue Patient left: in bed;with call bell/phone within reach Nurse Communication: Mobility status PT Visit Diagnosis: Unsteadiness on feet (R26.81);Muscle weakness (generalized) (M62.81)    Time: 6761-9509 PT Time Calculation (min) (ACUTE ONLY): 16 min   Charges:    PT Evaluation $PT Eval Low Complexity: 1 Low          Renlee Floor A. [name] PT, DPT Acute Rehabilitation Services Pager [phone] Office [phone]   [name] 05/15/2020, 5:28 PM

## 2020-05-15 NOTE — Consult Note (Signed)
ORTHOPAEDIC CONSULTATION  REQUESTING PHYSICIAN: Shela Leff, MD  Chief Complaint: Ulceration abscess and osteomyelitis right forefoot.  HPI: [name] is a 55 y.o. male who presents with abscess osteomyelitis right forefoot.  Patient was seen in the office yesterday he had a heart rate in the 130s was hypertension with a systolic pressure over 341 O2 sats 90.  Patient was sent to the emergency room for evaluation and treatment.  Past Medical History:  Diagnosis Date  . Abscess of right foot   . ADHD   . Anemia   . Anxiety   . Arthritis   . Asthma   . COPD (chronic obstructive pulmonary disease) ([place])   . Diabetes mellitus   . Diabetic foot ulcer ([place]) 07/09/2017  . Diabetic ulcer of back associated with type 2 diabetes mellitus, limited to breakdown of skin ([place])   . Foot osteomyelitis, left ([place])   . GERD (gastroesophageal reflux disease)   . Gout   . History of blood transfusion   . Hypertension   . Myocardial infarction [facility])    ? 2008,  states he did not have a cath done and never saw a cardiologist  . Neuropathy   . Pancreatitis   . Stroke [facility])    TIA- no residual  . Wears glasses    Past Surgical History:  Procedure Laterality Date  . AMPUTATION Left 11/09/2018   Procedure: LEFT TRANSMETATARSAL AMPUTATION;  Surgeon: Newt Minion, MD;  Location: [facility];  Service: Orthopedics;  Laterality: Left;  . AMPUTATION Left 12/13/2019   Procedure: LEFT BELOW KNEE AMPUTATION;  Surgeon: Newt Minion, MD;  Location: [facility];  Service: Orthopedics;  Laterality: Left;  . APPENDECTOMY    . FOOT SURGERY Left   . LAPAROSCOPIC APPENDECTOMY  07/28/2011   Procedure: APPENDECTOMY LAPAROSCOPIC;  Surgeon: Donato Heinz, MD;  Location: [facility];  Service: General;  Laterality: N/A;  . LEG SURGERY     Social History   Socioeconomic History  . Marital status: Legally Separated    Spouse name: Not on file  . Number of children: Not on file  . Years of education: Not on file   . Highest education level: Not on file  Occupational History  . Not on file  Tobacco Use  . Smoking status: Former Smoker    Types: Cigarettes  . Smokeless tobacco: Never Used  . Tobacco comment: quit smoking cigarettes 27 years ago 09/05/19  Vaping Use  . Vaping Use: Never used  Substance and Sexual Activity  . Alcohol use: Yes    Comment: 12/13/19- 2 40oz beer a week  . Drug use: Yes    Types: Marijuana    Comment: CBD every 3 days  . Sexual activity: Not on file  Other Topics Concern  . Not on file  Social History Narrative  . Not on file   Social Determinants of Health   Financial Resource Strain: Not on file  Food Insecurity: Not on file  Transportation Needs: Not on file  Physical Activity: Not on file  Stress: Not on file  Social Connections: Not on file   History reviewed. No pertinent family history. - negative except otherwise stated in the family history section Allergies  Allergen Reactions  . Aspirin Other (See Comments)    Patient says he's a free bleeder, tolerates low dose aspirin  . Penicillins Hives and Other (See Comments)    Has patient had a PCN reaction causing immediate rash, facial/tongue/throat swelling, SOB or lightheadedness with  hypotension: YES Has patient had a PCN reaction causing severe rash involving mucus membranes or skin necrosis: NO Has patient had a PCN reaction that required hospitalization: NO Has patient had a PCN reaction occurring within the last 10 years: NO If all of the above answers are "NO", then may proceed with Cephalosporin use.   Prior to Admission medications   Medication Sig Start Date End Date Taking? Authorizing Provider  albuterol (PROVENTIL HFA;VENTOLIN HFA) 108 (90 BASE) MCG/ACT inhaler Inhale 2 puffs into the lungs every 4 (four) hours as needed for wheezing or shortness of breath. Asthma   Yes [provider]  amitriptyline (ELAVIL) 150 MG tablet Take 150 mg by mouth at bedtime.   Yes [provider]  amLODipine (NORVASC) 5 MG tablet Take 5 mg by mouth daily. 04/26/20  Yes [provider]  aspirin EC 81 MG tablet Take 81 mg by mouth daily.   Yes [provider]  diphenhydrAMINE (BENADRYL) 25 MG tablet Take 25 mg by mouth at bedtime as needed for allergies.    Yes [provider]  doxycycline (VIBRA-TABS) 100 MG tablet Take 100 mg by mouth daily. 04/26/20  Yes [provider]  gabapentin (NEURONTIN) 600 MG tablet Take 1,200 mg by mouth every 8 (eight) hours.   Yes [provider]  HUMULIN N 100 UNIT/ML injection Inject 10-15 Units into the skin at bedtime. Sliding scale 09/19/18  Yes [provider]  losartan (COZAAR) 50 MG tablet Take 50 mg by mouth daily. 03/10/19  Yes [provider]  metFORMIN (GLUCOPHAGE) 1000 MG tablet Take 1,000 mg by mouth 2 (two) times daily with a meal.   Yes [provider]  methocarbamol (ROBAXIN) 500 MG tablet Take 1 tablet (500 mg total) by mouth every 6 (six) hours as needed for muscle spasms. 12/14/19  Yes Persons, [place], [place]  Multiple Vitamin (MULTIVITAMIN WITH MINERALS) TABS tablet Take 1 tablet by mouth daily.   Yes [provider]  NARCAN 4 MG/0.1ML LIQD nasal spray kit Place 1 spray into the nose as needed (opioid overdose).  05/31/19  Yes [provider]  omeprazole (PRILOSEC) 20 MG capsule Take 20 mg by mouth 2 (two) times daily.    Yes [provider]  Oxycodone HCl 10 MG TABS Take 10 mg by mouth 3 (three) times daily as needed. 05/13/20  Yes [provider]  pravastatin (PRAVACHOL) 20 MG tablet Take 20 mg by mouth daily.  03/10/19  Yes [provider]  vitamin C (ASCORBIC ACID) 500 MG tablet Take 500 mg by mouth daily.   Yes [provider]  Vitamin D, Ergocalciferol, (DRISDOL) 1.25 MG (50000 UT) CAPS capsule Take 50,000 Units by mouth every Tuesday.   Yes [provider]  XTAMPZA ER 13.5 MG C12A Take 13.5 mg by  mouth 2 (two) times daily. 07/06/19  Yes [provider]   No results found. - pertinent xrays, CT, MRI studies were reviewed and independently interpreted  Positive ROS: All other systems have been reviewed and were otherwise negative with the exception of those mentioned in the HPI and as above.  Physical Exam: General: Alert, no acute distress Psychiatric: Patient is competent for consent with normal mood and affect Lymphatic: No axillary or cervical lymphadenopathy Cardiovascular: No pedal edema Respiratory: No cyanosis, no use of accessory musculature GI: No organomegaly, abdomen is soft and non-tender    Images:  $Remove'@ENCIMAGES'RyNgxxn$ @  Labs:  Lab Results  Component Value Date   HGBA1C 11.4 (  H) 12/13/2019   HGBA1C 9.7 (H) 11/09/2018   REPTSTATUS 07/14/2017 FINAL 07/09/2017   REPTSTATUS 07/14/2017 FINAL 07/09/2017   CULT  07/09/2017    NO GROWTH 5 DAYS Performed at [facility], [address], [place], [place] [address]    CULT  07/09/2017    NO GROWTH 5 DAYS Performed at [facility], [address] [place], [place] [address]     Lab Results  Component Value Date   ALBUMIN 2.9 (L) 05/14/2020   ALBUMIN 2.9 (L) 12/13/2019   ALBUMIN 2.5 (L) 08/31/2019    Neurologic: Patient does not have protective sensation bilateral lower extremities.   MUSCULOSKELETAL:   Skin: Examination patient has blistering of the skin of the forefoot with cellulitis and drainage.  The calf is soft nontender no crepitation.  Patient's heart rate was in the 130s yesterday with systolic blood pressure greater than 150 and O2 sats of 90.  Patient states his blood sugars run in the high 200s his albumin is 2.9 his most recent hemoglobin A1c is 11.4.  Assessment: Abscess osteomyelitis right forefoot.  Plan: We will plan for transmetatarsal amputation today.  With the amount of cellulitis and soft tissue involvement patient may need a higher level amputation.  Patient's heart rate was in  the 130s in the office yesterday we will follow this and patient may need a cardiac work-up  Thank you for the consult and the opportunity to see Mr. Jashan Cotten, [place] [phone] 6:51 AM

## 2020-05-15 NOTE — Anesthesia Preprocedure Evaluation (Signed)
Anesthesia Evaluation  Patient identified by MRN, date of birth, ID band Patient awake    Reviewed: Allergy & Precautions, H&P , NPO status , Patient's Chart, lab work & pertinent test results  Airway Mallampati: II   Neck ROM: full    Dental   Pulmonary asthma , COPD, former smoker,    breath sounds clear to auscultation       Cardiovascular hypertension, + Past MI   Rhythm:regular Rate:Normal     Neuro/Psych PSYCHIATRIC DISORDERS Anxiety Depression CVA    GI/Hepatic GERD  ,  Endo/Other  diabetes, Type 2  Renal/GU      Musculoskeletal  (+) Arthritis ,   Abdominal   Peds  Hematology  (+) Blood dyscrasia, anemia ,   Anesthesia Other Findings   Reproductive/Obstetrics                             Anesthesia Physical Anesthesia Plan  ASA: III  Anesthesia Plan: MAC and Regional   Post-op Pain Management:    Induction: Intravenous  PONV Risk Score and Plan: 1 and Ondansetron, Propofol infusion, Treatment may vary due to age or medical condition and Midazolam  Airway Management Planned: Simple Face Mask  Additional Equipment:   Intra-op Plan:   Post-operative Plan:   Informed Consent: I have reviewed the patients History and Physical, chart, labs and discussed the procedure including the risks, benefits and alternatives for the proposed anesthesia with the patient or authorized representative who has indicated his/her understanding and acceptance.     Dental advisory given  Plan Discussed with: CRNA, Anesthesiologist and Surgeon  Anesthesia Plan Comments:         Anesthesia Quick Evaluation

## 2020-05-15 NOTE — Transfer of Care (Signed)
Immediate Anesthesia Transfer of Care Note  Patient: PAYMON ROSENSTEEL  Procedure(s) Performed: RIGHT TRANSMETATARSAL AMPUTATION (Right )  Patient Location: PACU  Anesthesia Type:MAC combined with regional for post-op pain  Level of Consciousness: awake, alert  and patient cooperative  Airway & Oxygen Therapy: Patient Spontanous Breathing  Post-op Assessment: Report given to RN, Post -op Vital signs reviewed and stable and Patient moving all extremities X 4  Post vital signs: Reviewed and stable  Last Vitals:  Vitals Value Taken Time  BP 140/75 05/15/20 0800  Temp    Pulse 104 05/15/20 0801  Resp 12 05/15/20 0801  SpO2 98 % 05/15/20 0801  Vitals shown include unvalidated device data.  Last Pain:  Vitals:   05/14/20 2034  TempSrc: Oral  PainSc:          Complications: No complications documented.

## 2020-05-15 NOTE — Progress Notes (Signed)
   05/15/20 1546  Assess: MEWS Score  Temp 98.3 F (36.8 C)  BP (!) 140/99  Pulse Rate (!) 116  Resp 18  SpO2 100 %  O2 Device Room Air  Assess: MEWS Score  MEWS Temp 0  MEWS Systolic 0  MEWS Pulse 2  MEWS RR 0  MEWS LOC 0  MEWS Score 2  MEWS Score Color Yellow  Assess: if the MEWS score is Yellow or Red  Were vital signs taken at a resting state? No  Focused Assessment No change from prior assessment  Early Detection of Sepsis Score *See Row Information* Low  MEWS guidelines implemented *See Row Information* Yes (pt super excited anytime anynone comes in to talk to him)  Treat  MEWS Interventions Escalated (See documentation below)  Pain Scale 0-10  Pain Score 6  Notify: Charge Nurse/RN  Name of Charge Nurse/RN Notified [place] Me[place]  Date Charge Nurse/RN Notified 05/15/20  Time Charge Nurse/RN Notified 1606  Notify: Provider  Provider Name/Title Ankit Joline Maxcy  Date Provider Notified 05/15/20  Time Provider Notified 1607  Notification Type Page  Notification Reason Change in status  Document  Progress note created (see row info) Yes

## 2020-05-15 NOTE — Anesthesia Procedure Notes (Signed)
Procedure Name: MAC Date/Time: 05/15/2020 7:27 AM Performed by: Claris Che, CRNA Pre-anesthesia Checklist: Patient identified, Emergency Drugs available, Suction available, Patient being monitored and Timeout performed Patient Re-evaluated:Patient Re-evaluated prior to induction Oxygen Delivery Method: Simple face mask

## 2020-05-15 NOTE — Progress Notes (Signed)
Difficult for pt to calm down, but he is pleasant and oriented.  500 mL bolus of NS and pain meds have brought pulse down into the low 100s.

## 2020-05-15 NOTE — Anesthesia Procedure Notes (Signed)
Anesthesia Regional Block: Popliteal block   Pre-Anesthetic Checklist: ,, timeout performed, Correct Patient, Correct Site, Correct Laterality, Correct Procedure, Correct Position, site marked, Risks and benefits discussed,  Surgical consent,  Pre-op evaluation,  At surgeon's request and post-op pain management  Laterality: Right  Prep: chloraprep       Needles:  Injection technique: Single-shot  Needle Type: Echogenic Stimulator Needle          Additional Needles:   Procedures:, nerve stimulator,,,,,,,   Nerve Stimulator or Paresthesia:  Response: plantar flexion of foot, 0.45 mA,   Additional Responses:   Narrative:  Start time: 05/15/2020 7:00 AM End time: 05/15/2020 7:11 AM Injection made incrementally with aspirations every 5 mL.  Performed by: Personally  Anesthesiologist: Achille Rich, MD  Additional Notes: Functioning IV was confirmed and monitors were applied.  A 22mm 21ga Arrow echogenic stimulator needle was used. Sterile prep and drape,hand hygiene and sterile gloves were used.  Negative aspiration and negative test dose prior to incremental administration of local anesthetic. The patient tolerated the procedure well.  Ultrasound guidance: relevent anatomy identified, needle position confirmed, local anesthetic spread visualized around nerve(s), vascular puncture avoided.  Image printed for medical record.

## 2020-05-15 NOTE — ED Notes (Signed)
MD Rathore paged for pain meds

## 2020-05-15 NOTE — Progress Notes (Signed)
   05/15/20 1746  Assess: MEWS Score  Temp 98.7 F (37.1 C)  BP (!) 152/83  Pulse Rate (!) 117  Resp 18  SpO2 100 %  O2 Device Room Air  Assess: MEWS Score  MEWS Temp 0  MEWS Systolic 0  MEWS Pulse 2  MEWS RR 0  MEWS LOC 0  MEWS Score 2  MEWS Score Color Yellow  Assess: if the MEWS score is Yellow or Red  Were vital signs taken at a resting state? No  Focused Assessment No change from prior assessment  Early Detection of Sepsis Score *See Row Information* Low  Treat  MEWS Interventions Other (Comment) (continued yellow MEWS protocol)

## 2020-05-15 NOTE — Evaluation (Signed)
Occupational Therapy Evaluation Patient Details Name: [name] MRN: [id number] DOB: August 08, 1965 Today's Date: 05/15/2020    History of Present Illness 55 y.o. male with medical history significant of COPD, asthma, insulin-dependent type II diabetes, hypertension, gout, GERD, CVA/TIA, history of left transtibial amputation, chronic right foot ulcer.  Underwent R TRANSMETATARSAL AMPUTATION with small wound vac in place.  NWB to RLE.   Clinical Impression   Patient admitted for the diagnosis and procedure above.  PTA he was Mod I with mobility and occasional use of a SPC.  He has a handicapped modified shower, and was able to care for all his self care and toileting.  Currently, he is needing up to Min A for basic transfers and lower body ADL.  Biggest deficits are impulsive and decreased ability to maintain NWB with initial stand and then sit.  Acute OT to continue in order to maximize his functional status prior to returning home.  Patient is open to [facility] PT.  HH OT can be referred in by [facility] PT if needed.      Follow Up Recommendations  No OT follow up    Equipment Recommendations  Wheelchair [phone]");Wheelchair cushion [phone] gel foam)    Recommendations for Other Services       Precautions / Restrictions Precautions Precautions: Fall Restrictions Weight Bearing Restrictions: Yes RLE Weight Bearing: Non weight bearing      Mobility Bed Mobility Overal bed mobility: Modified Independent                  Transfers Overall transfer level: Needs assistance Equipment used: Rolling walker (2 wheeled) Transfers: Sit to/from [employer] Sit to Stand: Min guard;Min assist Stand pivot transfers: Min guard       General transfer comment: difficulty maintaining NWB to RLE    Balance Overall balance assessment: Needs assistance Sitting-balance support: No upper extremity supported Sitting balance-Leahy Scale: Good     Standing balance support:  Bilateral upper extremity supported Standing balance-Leahy Scale: Poor Standing balance comment: moves too fast.  Wound vac line is getting under his foot.                           ADL either performed or assessed with clinical judgement   ADL Overall ADL's : Needs assistance/impaired     Grooming: Wash/dry hands;Wash/dry face;Set up;Sitting   Upper Body Bathing: Set up;Sitting   Lower Body Bathing: Set up;Sitting/lateral leans   Upper Body Dressing : Set up;Sitting   Lower Body Dressing: Supervision/safety;Sitting/lateral leans               Functional mobility during ADLs: Min guard;Rolling walker General ADL Comments: unable to maintain NWB with sit to stand, and stand to sit.     Vision Patient Visual Report: No change from baseline       Perception     Praxis      Pertinent Vitals/Pain Pain Assessment: No/denies pain     Hand Dominance Right   Extremity/Trunk Assessment Upper Extremity Assessment Upper Extremity Assessment: Overall WFL for tasks assessed   Lower Extremity Assessment Lower Extremity Assessment: Defer to PT evaluation   Cervical / Trunk Assessment Cervical / Trunk Assessment: Normal   Communication Communication Communication: No difficulties   Cognition Arousal/Alertness: Awake/alert Behavior During Therapy: Restless;Impulsive Overall Cognitive Status: Within Functional Limits for tasks assessed  Home Living Family/patient expects to be discharged to:: Private residence Living Arrangements: Spouse/significant other Available Help at Discharge: Family;Available 24 hours/day Type of Home: House Home Access: Ramped entrance     Home Layout: One level     Bathroom Shower/Tub: Producer, television/film/video: Handicapped height Bathroom Accessibility: Yes How Accessible: Accessible via walker Home Equipment: Walker - 2 wheels;Cane -  single point;Bedside commode;Shower seat;Grab bars - tub/shower;Hand held shower head          Prior Functioning/Environment Level of Independence: Independent with assistive device(s)        Comments: used cane PTA, cared for his own ADL, drove on occasion.        OT Problem List: Decreased strength;Decreased activity tolerance;Impaired balance (sitting and/or standing)      OT Treatment/Interventions: Self-care/ADL training;Therapeutic exercise;Therapeutic activities;Balance training    OT Goals(Current goals can be found in the care plan section) Acute Rehab OT Goals Patient Stated Goal: hoping to get home by Friday OT Goal Formulation: With patient Time For Goal Achievement: 05/29/20 Potential to Achieve Goals: Good  OT Frequency: Min 2X/week   Barriers to D/C:    none noted       Co-evaluation              AM-PAC OT "6 Clicks" Daily Activity     Outcome Measure Help from another person eating meals?: None Help from another person taking care of personal grooming?: A Little Help from another person toileting, which includes using toliet, bedpan, or urinal?: A Little Help from another person bathing (including washing, rinsing, drying)?: A Little Help from another person to put on and taking off regular upper body clothing?: None Help from another person to put on and taking off regular lower body clothing?: A Little 6 Click Score: 20   End of Session Equipment Utilized During Treatment: Gait belt;Rolling walker Nurse Communication: Mobility status  Activity Tolerance: Patient tolerated treatment well Patient left: in bed;with call bell/phone within reach  OT Visit Diagnosis: Unsteadiness on feet (R26.81)                Time: 6712-4580 OT Time Calculation (min): 20 min Charges:  OT General Charges $OT Visit: 1 Visit OT Evaluation $OT Eval Moderate Complexity: 1 Mod  05/15/2020  Rich, OTR/L  Acute Rehabilitation Services  Office:   [phone]   Suzanna Obey 05/15/2020, 4:40 PM

## 2020-05-15 NOTE — Progress Notes (Signed)
Pharmacy Antibiotic Note  MALACAI GRANTZ is a 55 y.o. male admitted on 05/14/2020 with wound infection.  Pharmacy has been consulted for zosyn dosing.  Plan: Zosyn 3.375g IV q8h (4 hour infusion).  F/u renal function, cultures and clinical course  Height: 6' (182.9 cm) Weight: 110.7 kg (244 lb) IBW/kg (Calculated) : 77.6  Temp (24hrs), Avg:98.6 F (37 C), Min:98 F (36.7 C), Max:98.9 F (37.2 C)  Recent Labs  Lab 05/14/20 1338 05/14/20 1419  WBC 20.1* 18.1*  CREATININE 0.52* 0.62  LATICACIDVEN  --  1.0    Estimated Creatinine Clearance: 138.7 mL/min (by C-G formula based on SCr of 0.62 mg/dL).    Allergies  Allergen Reactions  . Aspirin Other (See Comments)    Patient says he's a free bleeder, tolerates low dose aspirin  . Penicillins Hives and Other (See Comments)    Has patient had a PCN reaction causing immediate rash, facial/tongue/throat swelling, SOB or lightheadedness with hypotension: YES Has patient had a PCN reaction causing severe rash involving mucus membranes or skin necrosis: NO Has patient had a PCN reaction that required hospitalization: NO Has patient had a PCN reaction occurring within the last 10 years: NO If all of the above answers are "NO", then may proceed with Cephalosporin use.    Thank you for allowing pharmacy to be a part of this patient's care.  Talbert Cage [place] 05/15/2020 6:14 AM

## 2020-05-15 NOTE — Op Note (Signed)
05/15/2020  8:10 AM  PATIENT:  [name]    PRE-OPERATIVE DIAGNOSIS:  Abscess, Osteomyelitis Right Foot  POST-OPERATIVE DIAGNOSIS:  Same  PROCEDURE:  RIGHT TRANSMETATARSAL AMPUTATION  SURGEON:  Nadara Mustard, MD  PHYSICIAN ASSISTANT:None ANESTHESIA:   General  PREOPERATIVE INDICATIONS:  [name] is a  55 y.o. male with a diagnosis of Abscess, Osteomyelitis Right Foot who failed conservative measures and elected for surgical management.    The risks benefits and alternatives were discussed with the patient preoperatively including but not limited to the risks of infection, bleeding, nerve injury, cardiopulmonary complications, the need for revision surgery, among others, and the patient was willing to proceed.  OPERATIVE IMPLANTS: none  @ENCIMAGES @  OPERATIVE FINDINGS: Calcified vessels at the amputation site that were open.  No abscess no infection at the level amputation.  OPERATIVE PROCEDURE: Patient was brought the operating room after undergoing a regional block the right lower extremity was prepped using DuraPrep draped into a sterile field a timeout was called.  A fishmouth incision was made just proximal to the ulcerative tissue a transmetatarsal amputation was performed with a oscillating saw.  Electrocautery was used hemostasis the wound was irrigated with normal saline the tissue was healthy and viable at the amputation site there is no evidence of abscess or infected tissue at the level of amputation.  The incision was closed using 2-0 nylon a Prevena wound VAC was applied this had a good suction fit this was secured derma tack and overwrapped with Covan patient was taken the PACU in stable condition   DISCHARGE PLANNING:  Antibiotic duration: Continue antibiotics for 72 hours  Weightbearing: Nonweightbearing on the right  Pain medication: Opioid pathway  Dressing care/ Wound VAC: Continue wound VAC for 1 week  Ambulatory devices: Walker or  crutches  Discharge to: Anticipate discharge to home.  Follow-up: In the office 1 week post operative.

## 2020-05-15 NOTE — ED Provider Notes (Signed)
[facility] EMERGENCY DEPARTMENT Provider Note   CSN: [id number] Arrival date & time: 05/14/20  1406     History Chief Complaint  Patient presents with  . Foot Ulcer    [name] is a 55 y.o. male.  Patient presents to the emergency department with chief complaint of right foot ulcer.  He states that he was sent by Dr. Lajoyce Corners for IV antibiotics.  He is scheduled for right transmetatarsal amputation tomorrow.  When seen in clinic, he was tachycardic and was sent to the ED for concerns for sepsis.  Currently, patient complains of persistent right foot pain.  He denies being febrile.  He is diabetic.  He denies any other associated symptoms.  The history is provided by the patient. No language interpreter was used.       Past Medical History:  Diagnosis Date  . Abscess of right foot   . ADHD   . Anemia   . Anxiety   . Arthritis   . Asthma   . COPD (chronic obstructive pulmonary disease) (HCC)   . Diabetes mellitus   . Diabetic foot ulcer (HCC) 07/09/2017  . 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 Foot osteomyelitis, left (HCC)   . GERD (gastroesophageal reflux disease)   . Gout   . History of blood transfusion   . Hypertension   . Myocardial infarction [facility])    ? 2008,  states he did not have a cath done and never saw a cardiologist  . Neuropathy   . Pancreatitis   . Stroke [facility])    TIA- no residual  . Wears glasses     Patient Active Problem List   Diagnosis Date Noted  . Acquired absence of left leg below knee (HCC) 12/29/2019  . Abscess of left foot 12/13/2019  . Diabetes mellitus type II, non insulin dependent (HCC) 07/09/2017  . Subacute osteomyelitis, left ankle and foot (HCC) 07/09/2017  . Hypokalemia 07/09/2017  . Hyponatremia 07/09/2017  . AKI (acute kidney injury) (HCC) 07/09/2017  . Symptomatic anemia 07/09/2017  . Essential hypertension 07/09/2017  . Depression  with anxiety 07/09/2017  . Venous thrombosis 07/09/2017    Past Surgical History:  Procedure Laterality Date  . AMPUTATION Left 11/09/2018   Procedure: LEFT TRANSMETATARSAL AMPUTATION;  Surgeon: Nadara Mustard, MD;  Location: [facility] OR;  Service: Orthopedics;  Laterality: Left;  . AMPUTATION Left 12/13/2019   Procedure: LEFT BELOW KNEE AMPUTATION;  Surgeon: Nadara Mustard, MD;  Location: [facility] OR;  Service: Orthopedics;  Laterality: Left;  . APPENDECTOMY    . FOOT SURGERY Left   . LAPAROSCOPIC APPENDECTOMY  07/28/2011   Procedure: APPENDECTOMY LAPAROSCOPIC;  Surgeon: Fabio Bering, MD;  Location: [facility] ORS;  Service: General;  Laterality: N/A;  . LEG SURGERY         History reviewed. No pertinent family history.  Social History   Tobacco Use  . Smoking status: Former Smoker    Types: Cigarettes  . Smokeless tobacco: Never Used  . Tobacco comment: quit smoking cigarettes 27 years ago 09/05/19  Vaping Use  . Vaping Use: Never used  Substance Use Topics  . Alcohol use: Yes    Comment: 12/13/19- 2 40oz beer a week  . Drug use: Yes    Types: Marijuana    Comment: CBD every 3 days    Home Medications Prior to Admission medications   Medication Sig Start Date End Date  Taking? Authorizing Provider  albuterol (PROVENTIL HFA;VENTOLIN HFA) 108 (90 BASE) MCG/ACT inhaler Inhale 2 puffs into the lungs every 4 (four) hours as needed for wheezing or shortness of breath. Asthma    [provider]  amitriptyline (ELAVIL) 150 MG tablet Take 150 mg by mouth at bedtime.    [provider]  amLODipine (NORVASC) 5 MG tablet Take 5 mg by mouth daily. 04/26/20   [provider]  aspirin EC 81 MG tablet Take 81 mg by mouth daily.    [provider]  diphenhydrAMINE (BENADRYL) 25 MG tablet Take 25 mg by mouth at bedtime as needed for allergies.     [provider]  doxycycline (VIBRA-TABS) 100 MG tablet Take 100 mg by mouth daily. 04/26/20   [provider]   gabapentin (NEURONTIN) 600 MG tablet Take 1,200 mg by mouth every 8 (eight) hours.    [provider]  HUMULIN N 100 UNIT/ML injection Inject 10-15 Units into the skin at bedtime. Sliding scale 09/19/18   [provider]  losartan (COZAAR) 50 MG tablet Take 50 mg by mouth daily. 03/10/19   [provider]  metFORMIN (GLUCOPHAGE) 1000 MG tablet Take 1,000 mg by mouth 2 (two) times daily with a meal.    [provider]  methocarbamol (ROBAXIN) 500 MG tablet Take 1 tablet (500 mg total) by mouth every 6 (six) hours as needed for muscle spasms. 12/14/19   Persons, Bevely Palmer, PA  Multiple Vitamin (MULTIVITAMIN WITH MINERALS) TABS tablet Take 1 tablet by mouth daily.    [provider]  NARCAN 4 MG/0.1ML LIQD nasal spray kit Place 1 spray into the nose as needed (opioid overdose).  05/31/19   [provider]  omeprazole (PRILOSEC) 20 MG capsule Take 20 mg by mouth 2 (two) times daily.     [provider]  Oxycodone HCl 10 MG TABS Take 10 mg by mouth every 4 (four) hours. 05/13/20   [provider]  pravastatin (PRAVACHOL) 20 MG tablet Take 20 mg by mouth daily.  03/10/19   [provider]  vitamin C (ASCORBIC ACID) 500 MG tablet Take 500 mg by mouth daily.    [provider]  Vitamin D, Ergocalciferol, (DRISDOL) 1.25 MG (50000 UT) CAPS capsule Take 50,000 Units by mouth every Tuesday.    [provider]  XTAMPZA ER 13.5 MG C12A Take 13.5 mg by mouth 2 (two) times daily. 07/06/19   [provider]    Allergies    Aspirin and Penicillins  Review of Systems   Review of Systems  All other systems reviewed and are negative.   Physical Exam Updated Vital Signs BP 121/69 (BP Location: Right Arm)   Pulse (!) 103   Temp 98.9 F (37.2 C) (Oral)   Resp 16   SpO2 100%   Physical Exam Vitals and nursing note reviewed.  Constitutional:      Appearance: He is well-developed and well-nourished.   HENT:     Head: Normocephalic and atraumatic.  Eyes:     Conjunctiva/sclera: Conjunctivae normal.  Cardiovascular:     Rate and Rhythm: Normal rate and regular rhythm.     Heart sounds: No murmur heard.   Pulmonary:     Effort: Pulmonary effort is normal. No respiratory distress.     Breath sounds: Normal breath sounds.  Abdominal:     Palpations: Abdomen is soft.     Tenderness: There is no abdominal tenderness.  Musculoskeletal:  General: No edema.     Cervical back: Neck supple.     Comments: Left BKA   Skin:    General: Skin is warm and dry.     Comments: Right foot cellulitis and skin break down  See pictures  Neurological:     Mental Status: He is alert.  Psychiatric:        Mood and Affect: Mood and affect and mood normal.        Behavior: Behavior normal.     ED Results / Procedures / Treatments   Labs (all labs ordered are listed, but only abnormal results are displayed) Labs Reviewed  COMPREHENSIVE METABOLIC PANEL - Abnormal; Notable for the following components:      Result Value   Sodium 126 (*)    Chloride 91 (*)    Glucose, Bld 271 (*)    BUN 5 (*)    Calcium 8.6 (*)    Albumin 2.9 (*)    All other components within normal limits  CBC WITH DIFFERENTIAL/PLATELET - Abnormal; Notable for the following components:   WBC 18.1 (*)    RBC 3.71 (*)    Hemoglobin 9.9 (*)    HCT 31.7 (*)    Neutro Abs 16.5 (*)    Abs Immature Granulocytes 0.11 (*)    All other components within normal limits  SARS CORONAVIRUS 2 (TAT 6-24 HRS)  LACTIC ACID, PLASMA  URINALYSIS, ROUTINE W REFLEX MICROSCOPIC    EKG None  Radiology No results found.      Procedures .Critical Care Performed by: Montine Circle, PA-C Authorized by: Montine Circle, PA-C   Critical care provider statement:    Critical care time (minutes):  40   Critical care was necessary to treat or prevent imminent or life-threatening deterioration of the following conditions:   Sepsis   Critical care was time spent personally by me on the following activities:  Discussions with consultants, evaluation of patient's response to treatment, examination of patient, ordering and performing treatments and interventions, ordering and review of laboratory studies, ordering and review of radiographic studies, pulse oximetry, re-evaluation of patient's condition, obtaining history from patient or surrogate and review of old charts     Medications Ordered in ED Medications  sodium chloride 0.9 % bolus 1,000 mL (has no administration in time range)  fentaNYL (SUBLIMAZE) injection 50 mcg (has no administration in time range)    ED Course  I have reviewed the triage vital signs and the nursing notes.  Pertinent labs & imaging results that were available during my care of the patient were reviewed by me and considered in my medical decision making (see chart for details).    MDM Rules/Calculators/A&P                          This patient complains of right foot pain, this involves an extensive number of treatment options, and is a complaint that carries with it a high risk of complications and morbidity.    Differential Dx Osteomyelitis, cellulitis, abscess  Pertinent Labs I ordered, reviewed, and interpreted labs, which included CBC notable for 18.1, glucose 271, normal anion gap.  Medications I ordered medication vancomycin for infection.  Sources Previous records obtained and reviewed, which shows that Dr. Sharol Given intends to take the patient to the OR tomorrow morning.   Critical Interventions  IV antibiotics  Reassessments After the interventions stated above, I reevaluated the patient, pain better controlled.   Consultants Dr. Marlowe Sax -  [facility], who will admit.  Plan Admit    Final Clinical Impression(s) / ED Diagnoses Final diagnoses:  Diabetic foot infection [facility])    Rx / [place] Orders ED Discharge Orders    None       Montine Circle,  PA-C 05/15/20 0034    Palumbo, April, MD 05/15/20 0207

## 2020-05-15 NOTE — Progress Notes (Signed)
Patient admitted this morning for right foot ulcer.  Has a history of COPD, DM2, HTN, GERD, CVA, chronic right foot ulcer.  Taken to the OR by orthopedic this morning for right-sided TMA.  Recommending 72 h of antibiotics, wound VAC for 1 week and follow-up in 1 week as well.  Admitted for sepsis secondary to diabetic right foot infection on IV vancomycin and Zosyn.  Patient seen right after surgery in the PACU.  He is sitting up does not any complaints.  Tolerated surgery well.  Patient is adamant that he will be leaving the hospital tomorrow regardless of his medical condition but agreeable for outpatient follow-up.  He is agreed to stay in the hospital today for IV antibiotics.  Vital signs are stable. Not in any acute distress, right lower extremity surgical dressing with wound VAC in place noted.  Clear to auscultation bilaterally, normal sinus rhythm  Sepsis secondary to right diabetic foot infection status post right-sided TMA-continue IV antibiotics vancomycin and Zosyn for at least 72 h post surgery.  Wound VAC management per orthopedic.  Pain control.  Hypovolemic hyponatremia-should improve with IV fluids.  A.m. labs are pending.  Poorly controlled diabetes mellitus type 2 with hyperglycemia-continue sliding scale and Accu-Chek.  Repeat A1c.  Previous A1c 11.4 in August 2021.  Essential hypertension-currently antihypertensives on hold  Time spent-15 minutes  Stephania Fragmin MD [facility]

## 2020-05-15 NOTE — H&P (Signed)
History and Physical    [name] DOB: 1965-09-25 DOA: 05/14/2020  PCP: Abran Richard, MD  Chief Complaint: Foot ulcer  HPI: [name] is a 55 y.o. male with medical history significant of COPD, asthma, insulin-dependent type II diabetes, hypertension, gout, GERD, CVA/TIA, history of left transtibial amputation, chronic right foot ulcer.  Patient was seen at Dr. Jess Barters office on 1/25 for evaluation of his right foot ulcer and noted to have increasing swelling, pain, and drainage.  He was sent was sent over to the ED by Dr. Sharol Given for IV antibiotics due to concern for sepsis and is scheduled for right transmetatarsal amputation tomorrow.  Patient states his right foot has been swollen and painful for the past 1 week.  He is noticing constant drainage from the area.  He does not remember injuring the foot or stepping on anything sharp.  States he has not been treated with antibiotics for this infection.  Denies fevers or chills.  No other complaints.  Denies cough, shortness of breath, chest pain, nausea, vomiting, abdominal pain, or diarrhea.  He is not vaccinated against Covid.  ED Course: Afebrile.  Tachycardic on arrival but not hypotensive.  Not tachypneic or hypoxic.  WBC 18.1, hemoglobin 9.9 (not significantly changed compared to prior labs), platelet count 296K.  Sodium 126, potassium 3.9, chloride 91, bicarb 24, BUN 5, creatinine 0.6, glucose 271.  Lactic acid 1.0.  SARS-CoV-2 PCR test pending.  Patient was given 1 L normal saline bolus and vancomycin.  Review of Systems:  All systems reviewed and apart from history of presenting illness, are negative.  Past Medical History:  Diagnosis Date  . Abscess of right foot   . ADHD   . Anemia   . Anxiety   . Arthritis   . Asthma   . COPD (chronic obstructive pulmonary disease) ([place])   . Diabetes mellitus   . Diabetic foot ulcer ([place]) 07/09/2017  . Diabetic ulcer of back associated with type 2 diabetes mellitus,  limited to breakdown of skin ([place])   . Foot osteomyelitis, left ([place])   . GERD (gastroesophageal reflux disease)   . Gout   . History of blood transfusion   . Hypertension   . Myocardial infarction [facility])    ? 2008,  states he did not have a cath done and never saw a cardiologist  . Neuropathy   . Pancreatitis   . Stroke [facility])    TIA- no residual  . Wears glasses     Past Surgical History:  Procedure Laterality Date  . AMPUTATION Left 11/09/2018   Procedure: LEFT TRANSMETATARSAL AMPUTATION;  Surgeon: Newt Minion, MD;  Location: [place];  Service: Orthopedics;  Laterality: Left;  . AMPUTATION Left 12/13/2019   Procedure: LEFT BELOW KNEE AMPUTATION;  Surgeon: Newt Minion, MD;  Location: [place];  Service: Orthopedics;  Laterality: Left;  . APPENDECTOMY    . FOOT SURGERY Left   . LAPAROSCOPIC APPENDECTOMY  07/28/2011   Procedure: APPENDECTOMY LAPAROSCOPIC;  Surgeon: Donato Heinz, MD;  Location: [facility];  Service: General;  Laterality: N/A;  . LEG SURGERY       reports that he has quit smoking. His smoking use included cigarettes. He has never used smokeless tobacco. He reports current alcohol use. He reports current drug use. Drug: Marijuana.  Allergies  Allergen Reactions  . Aspirin Other (See Comments)    Patient says he's a free bleeder, tolerates low dose aspirin  . Penicillins Hives and Other (See  Comments)    Has patient had a PCN reaction causing immediate rash, facial/tongue/throat swelling, SOB or lightheadedness with hypotension: YES Has patient had a PCN reaction causing severe rash involving mucus membranes or skin necrosis: NO Has patient had a PCN reaction that required hospitalization: NO Has patient had a PCN reaction occurring within the last 10 years: NO If all of the above answers are "NO", then may proceed with Cephalosporin use.    History reviewed. No pertinent family history.  Prior to Admission medications   Medication Sig Start Date End Date Taking?  Authorizing Provider  albuterol (PROVENTIL HFA;VENTOLIN HFA) 108 (90 BASE) MCG/ACT inhaler Inhale 2 puffs into the lungs every 4 (four) hours as needed for wheezing or shortness of breath. Asthma   Yes [provider]  amitriptyline (ELAVIL) 150 MG tablet Take 150 mg by mouth at bedtime.   Yes [provider]  amLODipine (NORVASC) 5 MG tablet Take 5 mg by mouth daily. 04/26/20  Yes [provider]  aspirin EC 81 MG tablet Take 81 mg by mouth daily.   Yes [provider]  diphenhydrAMINE (BENADRYL) 25 MG tablet Take 25 mg by mouth at bedtime as needed for allergies.    Yes [provider]  doxycycline (VIBRA-TABS) 100 MG tablet Take 100 mg by mouth daily. 04/26/20  Yes [provider]  gabapentin (NEURONTIN) 600 MG tablet Take 1,200 mg by mouth every 8 (eight) hours.   Yes [provider]  HUMULIN N 100 UNIT/ML injection Inject 10-15 Units into the skin at bedtime. Sliding scale 09/19/18  Yes [provider]  losartan (COZAAR) 50 MG tablet Take 50 mg by mouth daily. 03/10/19  Yes [provider]  metFORMIN (GLUCOPHAGE) 1000 MG tablet Take 1,000 mg by mouth 2 (two) times daily with a meal.   Yes [provider]  methocarbamol (ROBAXIN) 500 MG tablet Take 1 tablet (500 mg total) by mouth every 6 (six) hours as needed for muscle spasms. 12/14/19  Yes Persons, Bevely Palmer, [place]  Multiple Vitamin (MULTIVITAMIN WITH MINERALS) TABS tablet Take 1 tablet by mouth daily.   Yes [provider]  NARCAN 4 MG/0.1ML LIQD nasal spray kit Place 1 spray into the nose as needed (opioid overdose).  05/31/19  Yes [provider]  omeprazole (PRILOSEC) 20 MG capsule Take 20 mg by mouth 2 (two) times daily.    Yes [provider]  Oxycodone HCl 10 MG TABS Take 10 mg by mouth 3 (three) times daily as needed. 05/13/20  Yes [provider]  pravastatin (PRAVACHOL) 20 MG tablet Take 20 mg by mouth daily.  03/10/19   Yes [provider]  vitamin C (ASCORBIC ACID) 500 MG tablet Take 500 mg by mouth daily.   Yes [provider]  Vitamin D, Ergocalciferol, (DRISDOL) 1.25 MG (50000 UT) CAPS capsule Take 50,000 Units by mouth every Tuesday.   Yes [provider]  XTAMPZA ER 13.5 MG C12A Take 13.5 mg by mouth 2 (two) times daily. 07/06/19  Yes [provider]    Physical Exam: Vitals:   05/14/20 1938 05/14/20 2034 05/15/20 0000 05/15/20 0303  BP: 119/67 121/69  (!) 145/99  Pulse: (!) 107 (!) 103  95  Resp: $Remo'18 16  17  'vUFiy$ Temp:  98.9 F (37.2 C)  98.6 F (37 C)  TempSrc:  Oral    SpO2: 100% 100%  100%  Weight:   110.7 kg   Height:   6' (1.829 m)  Physical Exam Constitutional:      General: He is not in acute distress. HENT:     Head: Normocephalic and atraumatic.  Eyes:     Extraocular Movements: Extraocular movements intact.     Conjunctiva/sclera: Conjunctivae normal.  Cardiovascular:     Rate and Rhythm: Normal rate and regular rhythm.     Pulses: Normal pulses.  Pulmonary:     Effort: Pulmonary effort is normal. No respiratory distress.     Breath sounds: No wheezing or rales.  Abdominal:     General: Bowel sounds are normal. There is no distension.     Palpations: Abdomen is soft.     Tenderness: There is no abdominal tenderness.  Musculoskeletal:     Cervical back: Normal range of motion and neck supple.     Comments: Right foot: Erythema.  Significant swelling of foot and toes.  Ulcer noted at the plantar surface of the foot.  Third toe appears necrotic.  Dorsalis pedis pulse intact.  Neurological:     Mental Status: He is alert.         Labs on Admission: I have personally reviewed following labs and imaging studies  CBC: Recent Labs  Lab 05/14/20 1338 05/14/20 1419  WBC 20.1* 18.1*  NEUTROABS 18,050* 16.5*  HGB 10.2* 9.9*  HCT 30.9* 31.7*  MCV 82.0 85.4  PLT 336 732   Basic Metabolic Panel: Recent Labs  Lab 05/14/20 1338  05/14/20 1419  NA 127* 126*  K 4.1 3.9  CL 91* 91*  CO2 25 24  GLUCOSE 240* 271*  BUN 6* 5*  CREATININE 0.52* 0.62  CALCIUM 9.1 8.6*   GFR: Estimated Creatinine Clearance: 138.7 mL/min (by C-G formula based on SCr of 0.62 mg/dL). Liver Function Tests: Recent Labs  Lab 05/14/20 1338 05/14/20 1419  AST 17 19  ALT 8* 10  ALKPHOS  --  81  BILITOT 0.8 0.9  PROT 7.1 7.3  ALBUMIN  --  2.9*   No results for input(s): LIPASE, AMYLASE in the last 168 hours. No results for input(s): AMMONIA in the last 168 hours. Coagulation Profile: No results for input(s): INR, PROTIME in the last 168 hours. Cardiac Enzymes: No results for input(s): CKTOTAL, CKMB, CKMBINDEX, TROPONINI in the last 168 hours. BNP (last 3 results) No results for input(s): PROBNP in the last 8760 hours. HbA1C: No results for input(s): HGBA1C in the last 72 hours. CBG: No results for input(s): GLUCAP in the last 168 hours. Lipid Profile: No results for input(s): CHOL, HDL, LDLCALC, TRIG, CHOLHDL, LDLDIRECT in the last 72 hours. Thyroid Function Tests: No results for input(s): TSH, T4TOTAL, FREET4, T3FREE, THYROIDAB in the last 72 hours. Anemia Panel: No results for input(s): VITAMINB12, FOLATE, FERRITIN, TIBC, IRON, RETICCTPCT in the last 72 hours. Urine analysis:    Component Value Date/Time   COLORURINE STRAW (A) 03/23/2019 1147   APPEARANCEUR CLEAR 03/23/2019 1147   LABSPEC 1.012 03/23/2019 1147   PHURINE 5.0 03/23/2019 1147   GLUCOSEU >=500 (A) 03/23/2019 1147   HGBUR NEGATIVE 03/23/2019 1147   BILIRUBINUR NEGATIVE 03/23/2019 1147   KETONESUR NEGATIVE 03/23/2019 1147   PROTEINUR NEGATIVE 03/23/2019 1147   UROBILINOGEN 1.0 07/28/2011 1147   NITRITE NEGATIVE 03/23/2019 1147   LEUKOCYTESUR NEGATIVE 03/23/2019 1147    Radiological Exams on Admission: No results found.  Assessment/Plan Principal Problem:   Foot ulcer ([place]) Active Problems:   Hyponatremia   Essential hypertension   Sepsis ([place])    COPD (chronic obstructive pulmonary disease) ([place])  Sepsis secondary to diabetic right foot infection: Tachycardic on arrival and labs showing leukocytosis.  No hypotension or lactic acidosis to suggest severe sepsis.  Right foot erythematous, significantly swollen, with an ulcer on the plantar surface and necrotic third toe.  Sent over here by Dr. Sharol Given for IV antibiotics and is scheduled for right transmetatarsal amputation tomorrow. -Patient was given vancomycin in the ED.  Will add on Zosyn.  Continue IV fluids.  Keep n.p.o.  Continue to monitor WBC count.  Hold home aspirin.  Poorly controlled insulin-dependent type II diabetes with hyperglycemia: A1c was 11.4 in August 2021.  Blood glucose 271 on initial labs. -Repeat A1c.  Sliding scale insulin sensitive every 4 hours as patient is currently n.p.o.  Hypertension -Hold antihypertensives given concern for sepsis.  Hyponatremia: Corrected sodium 129.  Sodium low on previous labs as well. -IV fluid hydration, check serum osmolarity, continue to monitor sodium level  Chronic pain -Continue home oxycodone ER, gabapentin.  Patient does not want any IV pain medications  Hyperlipidemia -Continue statin  COPD: Stable.  No wheezing. -Combivent as needed  DVT prophylaxis: SCDs Code Status: Full code Family Communication: No family available this time. Disposition Plan: Status is: Inpatient  Remains inpatient appropriate because:IV treatments appropriate due to intensity of illness or inability to take PO, Inpatient level of care appropriate due to severity of illness and Plan for surgery   Dispo: The patient is from: Home              Anticipated d/c is to: Home              Anticipated d/c date is: 3 days              Patient currently is not medically stable to d/c.   Difficult to place patient No  Level of care: Level of care: Telemetry Medical The medical decision making on this patient was of high complexity and the patient is  at high risk for clinical deterioration, therefore this is a level 3 visit.  Shela Leff MD [facility]  If 7PM-7AM, please contact night-coverage [email or web address]  05/15/2020, 5:47 AM

## 2020-05-15 NOTE — Progress Notes (Signed)
   05/15/20 1942  Assess: MEWS Score  Temp 97.7 F (36.5 C)  BP (!) 150/80  Pulse Rate (!) 106  Resp 18  SpO2 99 %  O2 Device Room Air  Assess: MEWS Score  MEWS Temp 0  MEWS Systolic 0  MEWS Pulse 1  MEWS RR 0  MEWS LOC 0  MEWS Score 1  MEWS Score Color Green  Assess: if the MEWS score is Yellow or Red  Were vital signs taken at a resting state? Yes  Focused Assessment No change from prior assessment  Early Detection of Sepsis Score *See Row Information* Low  Treat  MEWS Interventions Other (Comment) (continue yellow MEWS protocol)

## 2020-05-16 ENCOUNTER — Encounter (HOSPITAL_COMMUNITY): Payer: Self-pay | Admitting: Orthopedic Surgery

## 2020-05-16 DIAGNOSIS — L97514 Non-pressure chronic ulcer of other part of right foot with necrosis of bone: Secondary | ICD-10-CM | POA: Diagnosis not present

## 2020-05-16 LAB — CBC
HCT: 25.9 % — ABNORMAL LOW (ref 39.0–52.0)
Hemoglobin: 8.3 g/dL — ABNORMAL LOW (ref 13.0–17.0)
MCH: 27.1 pg (ref 26.0–34.0)
MCHC: 32 g/dL (ref 30.0–36.0)
MCV: 84.6 fL (ref 80.0–100.0)
Platelets: 257 10*3/uL (ref 150–400)
RBC: 3.06 MIL/uL — ABNORMAL LOW (ref 4.22–5.81)
RDW: 14.7 % (ref 11.5–15.5)
WBC: 6 10*3/uL (ref 4.0–10.5)
nRBC: 0 % (ref 0.0–0.2)

## 2020-05-16 LAB — BASIC METABOLIC PANEL
Anion gap: 8 (ref 5–15)
BUN: 5 mg/dL — ABNORMAL LOW (ref 6–20)
CO2: 25 mmol/L (ref 22–32)
Calcium: 8.4 mg/dL — ABNORMAL LOW (ref 8.9–10.3)
Chloride: 101 mmol/L (ref 98–111)
Creatinine, Ser: 0.56 mg/dL — ABNORMAL LOW (ref 0.61–1.24)
GFR, Estimated: >60 mL/min (ref >60)
Glucose, Bld: 225 mg/dL — ABNORMAL HIGH (ref 70–99)
Potassium: 3.7 mmol/L (ref 3.5–5.1)
Sodium: 134 mmol/L — ABNORMAL LOW (ref 135–145)

## 2020-05-16 LAB — MAGNESIUM: Magnesium: 1.5 mg/dL — ABNORMAL LOW (ref 1.7–2.4)

## 2020-05-16 LAB — GLUCOSE, CAPILLARY
Glucose-Capillary: 250 mg/dL — ABNORMAL HIGH (ref 70–99)
Glucose-Capillary: 274 mg/dL — ABNORMAL HIGH (ref 70–99)
Glucose-Capillary: 136 mg/dL — ABNORMAL HIGH (ref 70–99)
Glucose-Capillary: 226 mg/dL — ABNORMAL HIGH (ref 70–99)

## 2020-05-16 MED ORDER — INSULIN NPH (HUMAN) (ISOPHANE) 100 UNIT/ML ~~LOC~~ SUSP
7.0000 [IU] | Freq: Two times a day (BID) | SUBCUTANEOUS | Status: DC
  Administered 2020-05-16 – 2020-05-17 (×2): 7 [IU] via SUBCUTANEOUS
  Filled 2020-05-16: qty 10

## 2020-05-16 NOTE — TOC Progression Note (Addendum)
Transition of Care [facility]) - Progression Note    Patient Details  Name: [name] MRN: [id number] Date of Birth: [birth year]  Transition of Care [facility]) CM/SW Contact  Beckie Busing, RN Phone Number: [phone]  05/16/2020, 2:57 PM  Clinical Narrative:    CM received message about patient discharging on 1/28 with wound vac. Message has been left for Blossom Hoops with [employer].   1600 Just spoke with the wound vac rep Blossom Hoops) and she states that the [employer] wound vac doent need anything to be set up for home use. It comes with a portable vac. So the patient should be good to go home with it and physician can remove in the office. No set up is currently needed for the wound vac.         Expected Discharge Plan and Services                                                 Social Determinants of Health (SDOH) Interventions    Readmission Risk Interventions No flowsheet data found.

## 2020-05-16 NOTE — Progress Notes (Signed)
PROGRESS NOTE    [name]  BHA:[id number]  DOB: 1965-10-11  DOA: 05/14/2020 PCP: Alvina Filbert, MD Outpatient Specialists:   Hospital course:  55 year old man status post left BKA with history of COPD, DM 2, HTN and CVA was admitted yesterday for sepsis secondary to diabetic right foot ulcer.  Patient underwent TMA by Dr. Lajoyce Corners on 05/15/2020 and plan is for IV antibiotics for 72 hours and wound VAC for 1 week.   Subjective:  "Doc, I am going home tomorrow.  I do not know if they are going to let me go and I do not mean to be ugly or rude but I am going home tomorrow no questions about it".   Objective: Vitals:   05/16/20 0447 05/16/20 0447 05/16/20 0828 05/16/20 1138  BP: 130/84 130/84 135/81 130/83  Pulse:  97 87 82  Resp: 18 18 16 17   Temp: 98.4 F (36.9 C) 98.4 F (36.9 C) 98 F (36.7 C) 97.6 F (36.4 C)  TempSrc: Oral Oral Oral Oral  SpO2:  100% 100% 100%  Weight:      Height:        Intake/Output Summary (Last 24 hours) at 05/16/2020 1440 Last data filed at 05/16/2020 1139 Gross per 24 hour  Intake 1184.32 ml  Output 2600 ml  Net -1415.68 ml   Filed Weights   05/15/20 0000  Weight: 110.7 kg     Exam:  General: Well-appearing patient sitting up in bed chatting with nurse and me in excellent spirits demanding to go home in the morning. Eyes: sclera anicteric, conjuctiva mild injection bilaterally CVS: S1-S2, regular  Respiratory:  decreased air entry bilaterally secondary to decreased inspiratory effort, rales at bases  GI: NABS, soft, NT  LE: Status post well-healed left BKA.  Right foot is TBI with wound VAC draining dark red blood. Neuro: grossly nonfocal.    Assessment & Plan:   55 year old man with poorly controlled DM 2 status post left BKA is now admitted for IV antibiotics status post right TMA for nonhealing right diabetic ulcer.  Status post right TMA Wound cultures are growing out staph aureus Discontinue cefepime, continue  vancomycin Plan was for 72 hours of antibiotics and patient states he is adamant about going home tomorrow--which should just back over 72 hours. Discussed with PA persons who works with Dr. 44 who notes it is okay to be discharged with wound VAC.  DM2 Very much appreciate diabetes coordinator Will add NPH 7 units twice daily per her recommendation  Hyponatremia Normalized  HTN Blood pressure is normal holding his home BP meds Continue to hold home BP meds  Chronic pain Continue home doses of oxycodone and gabapentin   DVT prophylaxis: SCD Code Status: Full Family Communication: Patient states he is in contact with his wife Disposition Plan:   Patient is from: Home  Anticipated Discharge Location: Home  Barriers to Discharge: Continued need for IV vancomycin till tomorrow  Is patient medically stable for Discharge: No   Consultants:  Orthopedics  Procedures:  Status post TMA 05/15/2020  Antimicrobials:  Cefepime discontinued today  Continue vancomycin day #2   Data Reviewed:  Basic Metabolic Panel: Recent Labs  Lab 05/14/20 1338 05/14/20 1419 05/16/20 0253  NA 127* 126* 134*  K 4.1 3.9 3.7  CL 91* 91* 101  CO2 25 24 25   GLUCOSE 240* 271* 225*  BUN 6* 5* 5*  CREATININE 0.52* 0.62 0.56*  CALCIUM 9.1 8.6* 8.4*  MG  --   --  1.5*   Liver Function Tests: Recent Labs  Lab 05/14/20 1338 05/14/20 1419  AST 17 19  ALT 8* 10  ALKPHOS  --  81  BILITOT 0.8 0.9  PROT 7.1 7.3  ALBUMIN  --  2.9*   No results for input(s): LIPASE, AMYLASE in the last 168 hours. No results for input(s): AMMONIA in the last 168 hours. CBC: Recent Labs  Lab 05/14/20 1338 05/14/20 1419 05/16/20 0253  WBC 20.1* 18.1* 6.0  NEUTROABS 18,050* 16.5*  --   HGB 10.2* 9.9* 8.3*  HCT 30.9* 31.7* 25.9*  MCV 82.0 85.4 84.6  PLT 336 296 257   Cardiac Enzymes: No results for input(s): CKTOTAL, CKMB, CKMBINDEX, TROPONINI in the last 168 hours. BNP (last 3 results) No results  for input(s): PROBNP in the last 8760 hours. CBG: Recent Labs  Lab 05/15/20 1305 05/15/20 1736 05/15/20 2056 05/16/20 0827 05/16/20 1136  GLUCAP 204* 286* 114* 250* 274*    Recent Results (from the past 240 hour(s))  SARS CORONAVIRUS 2 (TAT 6-24 HRS) Nasopharyngeal Nasopharyngeal Swab     Status: None   Collection Time: 05/15/20 12:03 AM   Specimen: Nasopharyngeal Swab  Result Value Ref Range Status   SARS Coronavirus 2 NEGATIVE NEGATIVE Final    Comment: (NOTE) SARS-CoV-2 target nucleic acids are NOT DETECTED.  The SARS-CoV-2 RNA is generally detectable in upper and lower respiratory specimens during the acute phase of infection. Negative results do not preclude SARS-CoV-2 infection, do not rule out co-infections with other pathogens, and should not be used as the sole basis for treatment or other patient management decisions. Negative results must be combined with clinical observations, patient history, and epidemiological information. The expected result is Negative.  Fact Sheet for Patients: HairSlick.no  Fact Sheet for Healthcare Providers: quierodirigir.com  This test is not yet approved or cleared by the [place] FDA and  has been authorized for detection and/or diagnosis of SARS-CoV-2 by FDA under an Emergency Use Authorization (EUA). This EUA will remain  in effect (meaning this test can be used) for the duration of the COVID-19 declaration under Se ction 564(b)(1) of the Act, 21 U.S.C. section 360bbb-3(b)(1), unless the authorization is terminated or revoked sooner.  Performed at [facility], [address] [place], [place] [address]       Studies: No results found.   Scheduled Meds: . amitriptyline  150 mg Oral QHS  . gabapentin  1,200 mg Oral Q8H  . insulin aspart  0-15 Units Subcutaneous TID WC  . insulin aspart  0-5 Units Subcutaneous QHS  . insulin aspart  3 Units Subcutaneous  TID WC  . insulin NPH Human  7 Units Subcutaneous BID AC & HS  . oxyCODONE  10 mg Oral BID  . pantoprazole  40 mg Oral Daily  . pravastatin  20 mg Oral q1800   Continuous Infusions: . piperacillin-tazobactam (ZOSYN)  IV 3.375 g (05/16/20 1016)    Principal Problem:   Foot ulcer (HCC) Active Problems:   Hyponatremia   Essential hypertension   Sepsis (HCC)   COPD (chronic obstructive pulmonary disease) (HCC)   Diabetic foot infection (HCC)     Marnae Madani Tublu Genasis Zingale, [facility]  If 7PM-7AM, please contact night-coverage [email or web address] Password [email or web address] 05/16/2020, 2:40 PM    LOS: 1 day

## 2020-05-16 NOTE — Progress Notes (Signed)
Physical Therapy Treatment Patient Details Name: [name] MRN: [id number] DOB: 02-20-1966 Today's Date: 05/16/2020    History of Present Illness 55 y.o. male with medical history significant of COPD, asthma, insulin-dependent type II diabetes, hypertension, gout, GERD, CVA/TIA, history of left transtibial amputation, chronic right foot ulcer.  Underwent R TRANSMETATARSAL AMPUTATION with small wound vac in place.  NWB to RLE.    PT Comments    Patient progressing towards physical therapy goals. Patient with improved performance with maintaining NWB during sit to stand transfer with RW. Patient continues to require cues for slowed pace as patient is unsteady and puts him at an increase risk for falls. Patient with good ability to maintain NWB with ambulation. Continue to recommend HHPT following d/c and necessary DME.    Follow Up Recommendations  Home health PT     Equipment Recommendations  Wheelchair (measurements PT);Wheelchair cushion (measurements PT);Rolling [place] with 5" wheels    Recommendations for Other Services       Precautions / Restrictions Precautions Precautions: Fall Precaution Comments: small wound vac, hx of L BKA with prosthetic Restrictions Weight Bearing Restrictions: Yes RLE Weight Bearing: Non weight bearing LLE Weight Bearing:  (prosthesis)    Mobility  Bed Mobility Overal bed mobility: Modified Independent                Transfers Overall transfer level: Needs assistance Equipment used: Rolling Snyder Colavito (2 wheeled) Transfers: Sit to/from Stand Sit to Stand: Min guard         General transfer comment: cues for hand placement, slowed pace and NWB on R as patient is impulsive to stand. improved ability to maintain NWB with transition  Ambulation/Gait Ambulation/Gait assistance: Min guard Gait Distance (Feet): 25 Feet Assistive device: Rolling Tudor Chandley (2 wheeled) Gait Pattern/deviations:  (hop to pattern)     General Gait Details:  cues for slowed pace and NWB on R. Improved ability with following through with cues of slowed pace   Social research officer, government Rankin (Stroke Patients Only)       Balance Overall balance assessment: Needs assistance Sitting-balance support: No upper extremity supported Sitting balance-Leahy Scale: Good     Standing balance support: Bilateral upper extremity supported Standing balance-Leahy Scale: Poor Standing balance comment: moves too fast.  Wound vac line is getting under his foot. Reliant on RW for support                            Cognition Arousal/Alertness: Awake/alert Behavior During Therapy: Restless;Impulsive Overall Cognitive Status: Within Functional Limits for tasks assessed                                        Exercises      General Comments        Pertinent Vitals/Pain Pain Assessment: No/denies pain    Home Living                      Prior Function            PT Goals (current goals can now be found in the care plan section) Acute Rehab PT Goals Patient Stated Goal: hoping to get home by Friday PT Goal Formulation: With patient Time For Goal Achievement: 05/29/20 Potential to Achieve Goals:  Good Progress towards PT goals: Progressing toward goals    Frequency    Min 3X/week      PT Plan Current plan remains appropriate    Co-evaluation              AM-PAC PT "6 Clicks" Mobility   Outcome Measure  Help needed turning from your back to your side while in a flat bed without using bedrails?: None Help needed moving from lying on your back to sitting on the side of a flat bed without using bedrails?: None Help needed moving to and from a bed to a chair (including a wheelchair)?: A Little Help needed standing up from a chair using your arms (e.g., wheelchair or bedside chair)?: A Little Help needed to walk in hospital room?: A Little Help needed climbing  3-5 steps with a railing? : A Little 6 Click Score: 20    End of Session Equipment Utilized During Treatment: Gait belt Activity Tolerance: Patient limited by fatigue Patient left: in bed;with call bell/phone within reach Nurse Communication: Mobility status PT Visit Diagnosis: Unsteadiness on feet (R26.81);Muscle weakness (generalized) (M62.81)     Time: 0814-4818 PT Time Calculation (min) (ACUTE ONLY): 19 min  Charges:  $Therapeutic Activity: 8-22 mins                     [name] A. [name] PT, DPT Acute Rehabilitation Services Pager [phone] Office [phone]    [name] 05/16/2020, 5:40 PM

## 2020-05-16 NOTE — Anesthesia Postprocedure Evaluation (Signed)
Anesthesia Post Note  Patient: [name]  Procedure(s) Performed: RIGHT TRANSMETATARSAL AMPUTATION (Right )     Patient location during evaluation: PACU Anesthesia Type: Regional and MAC Level of consciousness: awake and alert Pain management: pain level controlled Vital Signs Assessment: post-procedure vital signs reviewed and stable Respiratory status: spontaneous breathing, nonlabored ventilation, respiratory function stable and patient connected to nasal cannula oxygen Cardiovascular status: stable and blood pressure returned to baseline Postop Assessment: no apparent nausea or vomiting Anesthetic complications: no   No complications documented.  Last Vitals:  Vitals:   05/16/20 1138 05/16/20 1635  BP: 130/83 (!) 171/88  Pulse: 82 97  Resp: 17 16  Temp: 36.4 C 36.6 C  SpO2: 100% 100%    Last Pain:  Vitals:   05/16/20 1635  TempSrc: Oral  PainSc:                  [place]

## 2020-05-16 NOTE — Progress Notes (Signed)
Pt transportation has been arranged for 05/16/2020 at 1500 with the company that helps him get to all of his doctor's appointments.  Their # [phone]  [employer] # [phone]  They will pick him up at the main entrance at 1500 and take him to his home in [place], [place]. The driver should have a badge and being driving a marked vehicle.  Waiting on walker, wheelchair, and wheelchair cushion to take with him.

## 2020-05-16 NOTE — Progress Notes (Signed)
Patient is postop day one status post right transmetatarsal amputation.  Praveena VAC is functioning well.  Patient states he is bumped his foot last night but is otherwise doing okay.  Has been working with physical therapy   Vital signs stable VAC is working with good suction fit.    Postop day one status post above from an orthopedic standpoint patient could be discharged when medically stable with 1 week follow-up.

## 2020-05-16 NOTE — Progress Notes (Addendum)
Inpatient Diabetes Program Recommendations  AACE/ADA: New Consensus Statement on Inpatient Glycemic Control (2015)  Target Ranges:  Prepandial:   less than 140 mg/dL      Peak postprandial:   less than 180 mg/dL (1-2 hours)      Critically ill patients:  140 - 180 mg/dL   Lab Results  Component Value Date   GLUCAP 250 (H) 05/16/2020   HGBA1C 9.7 (H) 05/15/2020    Review of Glycemic Control Results for [name], [name] (MRN [id number]) as of 05/16/2020 11:33  Ref. Range 05/15/2020 13:05 05/15/2020 17:36 05/15/2020 20:56 05/16/2020 08:27  Glucose-Capillary Latest Ref Range: 70 - 99 mg/dL 053 (H) 976 (H) 734 (H) 250 (H)   Diabetes history: Type 2 DM Outpatient Diabetes medications: NPH 10-15 units QHS, Metformin 1000 mg BID Current orders for Inpatient glycemic control: Novolog 3 units TID, Novolog 0-15 units TID, Novolog 0-5 units QHS  Inpatient Diabetes Program Recommendations:    Consider adding NPH 7 units BID. Secure chat sent to MD.  Addendum: Spoke with patient regarding outpatient diabetes management. Patient verifies home regimen and states, "I manage my diabetes myself and adjust when I need to."  Reviewed patient's current A1c of 9.7%. Explained what a A1c is and what it measures. Also reviewed goal A1c with patient, importance of good glucose control @ home, and blood sugar goals. Reviewed patho of DM, need for insulin, role of pancreas, impact of glucose trends on risk for infections, vascular changes and co-morbidities.  Patient has a meter and reports checking 2 times per day with ranges >200 mg/dL. Reviewed recommended CBG frequency and when to call MD.  Education provided on NPH, action and length of duration. Would recommend increasing dosages to BID. Patient is planning to see PCP in Feb. Encouraged to bring blood sugar readings with him to appointment. Has no further questions at this time.   Thanks, Lujean Rave, MSN, RNC-OB Diabetes Coordinator [phone]  (8a-5p)

## 2020-05-17 ENCOUNTER — Other Ambulatory Visit (HOSPITAL_COMMUNITY): Payer: Self-pay | Admitting: Internal Medicine

## 2020-05-17 DIAGNOSIS — L97514 Non-pressure chronic ulcer of other part of right foot with necrosis of bone: Secondary | ICD-10-CM | POA: Diagnosis not present

## 2020-05-17 LAB — BASIC METABOLIC PANEL
Anion gap: 10 (ref 5–15)
BUN: 7 mg/dL (ref 6–20)
CO2: 26 mmol/L (ref 22–32)
Calcium: 8.6 mg/dL — ABNORMAL LOW (ref 8.9–10.3)
Chloride: 99 mmol/L (ref 98–111)
Creatinine, Ser: 0.7 mg/dL (ref 0.61–1.24)
GFR, Estimated: >60 mL/min (ref >60)
Glucose, Bld: 164 mg/dL — ABNORMAL HIGH (ref 70–99)
Potassium: 3.8 mmol/L (ref 3.5–5.1)
Sodium: 135 mmol/L (ref 135–145)

## 2020-05-17 LAB — CBC
HCT: 27.1 % — ABNORMAL LOW (ref 39.0–52.0)
Hemoglobin: 8.4 g/dL — ABNORMAL LOW (ref 13.0–17.0)
MCH: 26.4 pg (ref 26.0–34.0)
MCHC: 31 g/dL (ref 30.0–36.0)
MCV: 85.2 fL (ref 80.0–100.0)
Platelets: 273 10*3/uL (ref 150–400)
RBC: 3.18 MIL/uL — ABNORMAL LOW (ref 4.22–5.81)
RDW: 14.2 % (ref 11.5–15.5)
WBC: 5.9 10*3/uL (ref 4.0–10.5)
nRBC: 0 % (ref 0.0–0.2)

## 2020-05-17 LAB — MAGNESIUM: Magnesium: 1.5 mg/dL — ABNORMAL LOW (ref 1.7–2.4)

## 2020-05-17 LAB — GLUCOSE, CAPILLARY
Glucose-Capillary: 234 mg/dL — ABNORMAL HIGH (ref 70–99)
Glucose-Capillary: 191 mg/dL — ABNORMAL HIGH (ref 70–99)

## 2020-05-17 MED ORDER — METRONIDAZOLE 500 MG PO TABS: 500.0000 mg | ORAL_TABLET | Freq: Three times a day (TID) | ORAL | 0 refills | Status: DC

## 2020-05-17 MED ORDER — CIPROFLOXACIN HCL 500 MG PO TABS: 500.0000 mg | ORAL_TABLET | Freq: Two times a day (BID) | ORAL | 0 refills | Status: DC

## 2020-05-17 MED ORDER — CIPROFLOXACIN HCL 500 MG PO TABS
500.0000 mg | ORAL_TABLET | Freq: Two times a day (BID) | ORAL | Status: DC
  Administered 2020-05-17: 500 mg via ORAL
  Filled 2020-05-17: qty 1

## 2020-05-17 MED ORDER — METRONIDAZOLE 500 MG PO TABS
500.0000 mg | ORAL_TABLET | Freq: Three times a day (TID) | ORAL | Status: DC
  Administered 2020-05-17: 500 mg via ORAL
  Filled 2020-05-17: qty 1

## 2020-05-17 MED FILL — CIPROFLOXACIN HCL 500 MG TA: 500 | 1 days supply | Qty: 2 | Fill #0

## 2020-05-17 MED FILL — metroNIDAZOLE 500 MG TABS: 500 | 1 days supply | Qty: 3 | Fill #0

## 2020-05-17 NOTE — Progress Notes (Signed)
[name] to be D/C'd  per MD order. Discussed with the patient and all questions fully answered.  VSS, Skin clean, dry and intact without evidence of skin break down, no evidence of skin tears noted.  IV catheter discontinued intact. Site without signs and symptoms of complications. Dressing and pressure applied.  An After Visit Summary was printed and given to the patient. Patient received prescription.  D/c education completed with patient including follow up instructions, medication list, d/c activities limitations if indicated, with other d/c instructions as indicated by MD - patient able to verbalize understanding, all questions fully answered.   Patient instructed to return to ED, call 911, or call MD for any changes in condition.   Patient to be escorted via WC, and D/C home via patient's medical transport service.  Pt took walker, meds from pharmacy, and prevena supplies with him.

## 2020-05-17 NOTE — Progress Notes (Signed)
Wheelchair:  Patient suffers from walker which impairs their ability to perform daily activities like ambulate in the home. A walker will not resolve  issue with performing activities of daily living. A wheelchair will allow patient to safely perform daily activities. Patient is not able to propel themselves in the home using a standard weight wheelchair due to weakness. Patient can self propel in the lightweight wheelchair. Length of need lifetime.  Accessories: elevating leg rests (ELRs), wheel locks, extensions and anti-tippers.

## 2020-05-17 NOTE — Discharge Summary (Signed)
[name] OIN:[id number] DOB: 1966/03/17 DOA: 05/14/2020  PCP: Abran Richard, MD  Admit date: 05/14/2020  Discharge date: 05/17/2020  Admitted From: Home   disposition: Home   Recommendations for Outpatient Follow-up:   Follow up with PCP in 1-2 weeks Follow-up with orthopedics in 1 week as already scheduled.  Home Health: Home health PT Equipment/Devices: None Consultations: Orthopedics Discharge Condition: Improved CODE STATUS: Full Diet Recommendation: Heart Healthy carb controlled  Diet Order            Diet - low sodium heart healthy           Diet Carb Modified Fluid consistency: Thin; Room service appropriate? Yes  Diet effective now                  Chief Complaint  Patient presents with  . Foot Ulcer     Brief history of present illness from the day of admission and additional interim summary     55 year old man status post left BKA with history of COPD, DM 2, HTN and CVA was admitted yesterday for sepsis secondary to diabetic right foot ulcer.  Patient was seen by Dr. Sharol Given who recommended TMA.                                                                 Hospital Course   Patient underwent TMA by Dr. Sharol Given on 05/15/2020 and plan is for IV antibiotics for 72 hours and wound VAC for 1 week.  Patient is adamant by going home on 05/17/2020.  Discussed with pitted PA who noted that he was okay for discharge according to their service.    Status post right TMA Wound cultures are growing out staph aureus Discussed with PA persons who works with Dr. Sharol Given who notes it is okay to be discharged with wound VAC.  DM2 Very much appreciate diabetes coordinator Will add NPH 7 units twice daily per her recommendation  Hyponatremia Normalized  HTN Blood pressure is normal holding his home BP  meds Continue to hold home BP meds  Chronic pain Continue home doses of oxycodone and gabapentin    Discharge diagnosis     Principal Problem:   Foot ulcer ([place]) Active Problems:   Hyponatremia   Essential hypertension   Sepsis ([place])   COPD (chronic obstructive pulmonary disease) ([place])   Diabetic foot infection ([place])    Discharge instructions    Discharge Instructions    Call MD for:  redness, tenderness, or signs of infection (pain, swelling, redness, odor or green/yellow discharge around incision site)   Complete by: As directed    Diet - low sodium heart healthy   Complete by: As directed    Discharge instructions   Complete by: As directed    1. Make sure you keep  your appointment with Orthopedics in 1 week. They will check out your wound and remove your wound vac at that time. 2. I am giving you 1 more day of antibiotics--you will take Ciprofloxicin and Flagyl as written on the bottle till tomorrow. (Only 2 doses of Ciprofoxacin and 3 doses of Flagyl) 3. Continue to take all your other medications as you were before you came in.  4. See your PCP in 1 week to go over your blood sugars and adjust your insulin as necessary. It will be really important to have good blood sugar control in order for your wound to heal well.   Discharge wound care:   Complete by: As directed    As noted above per Orthopedics instruction.   Increase activity slowly   Complete by: As directed    Negative Pressure Wound Therapy - Incisional   Complete by: As directed    Ahow patient how to attach prevena vac. Should call office if alarms      Discharge Medications   Allergies as of 05/17/2020      Reactions   Aspirin Other (See Comments)   Patient says he's a free bleeder, tolerates low dose aspirin   Penicillins Hives, Other (See Comments)   Has patient had a PCN reaction causing immediate rash, facial/tongue/throat swelling, SOB or lightheadedness with hypotension: YES Has patient  had a PCN reaction causing severe rash involving mucus membranes or skin necrosis: NO Has patient had a PCN reaction that required hospitalization: NO Has patient had a PCN reaction occurring within the last 10 years: NO If all of the above answers are "NO", then may proceed with Cephalosporin use.      Medication List    STOP taking these medications   doxycycline 100 MG tablet Commonly known as: VIBRA-TABS     TAKE these medications   albuterol 108 (90 Base) MCG/ACT inhaler Commonly known as: VENTOLIN HFA Inhale 2 puffs into the lungs every 4 (four) hours as needed for wheezing or shortness of breath. Asthma   amitriptyline 150 MG tablet Commonly known as: ELAVIL Take 150 mg by mouth at bedtime.   amLODipine 5 MG tablet Commonly known as: NORVASC Take 5 mg by mouth daily.   aspirin EC 81 MG tablet Take 81 mg by mouth daily.   ciprofloxacin 500 MG tablet Commonly known as: CIPRO Take 1 tablet (500 mg total) by mouth 2 (two) times daily for 2 doses.   diphenhydrAMINE 25 MG tablet Commonly known as: BENADRYL Take 25 mg by mouth at bedtime as needed for allergies.   gabapentin 600 MG tablet Commonly known as: NEURONTIN Take 1,200 mg by mouth every 8 (eight) hours.   HumuLIN N 100 UNIT/ML injection Generic drug: insulin NPH Human Inject 10-15 Units into the skin at bedtime. Sliding scale   losartan 50 MG tablet Commonly known as: COZAAR Take 50 mg by mouth daily.   metFORMIN 1000 MG tablet Commonly known as: GLUCOPHAGE Take 1,000 mg by mouth 2 (two) times daily with a meal.   methocarbamol 500 MG tablet Commonly known as: ROBAXIN Take 1 tablet (500 mg total) by mouth every 6 (six) hours as needed for muscle spasms.   metroNIDAZOLE 500 MG tablet Commonly known as: FLAGYL Take 1 tablet (500 mg total) by mouth every 8 (eight) hours for 3 doses.   multivitamin with minerals Tabs tablet Take 1 tablet by mouth daily.   Narcan 4 MG/0.1ML Liqd nasal spray  kit Generic drug: naloxone Place 1 spray  into the nose as needed (opioid overdose).   omeprazole 20 MG capsule Commonly known as: PRILOSEC Take 20 mg by mouth 2 (two) times daily.   Oxycodone HCl 10 MG Tabs Take 10 mg by mouth 3 (three) times daily as needed.   pravastatin 20 MG tablet Commonly known as: PRAVACHOL Take 20 mg by mouth daily.   vitamin C 500 MG tablet Commonly known as: ASCORBIC ACID Take 500 mg by mouth daily.   Vitamin D (Ergocalciferol) 1.25 MG (50000 UNIT) Caps capsule Commonly known as: DRISDOL Take 50,000 Units by mouth every Tuesday.   Xtampza ER 13.5 MG C12a Generic drug: oxyCODONE ER Take 13.5 mg by mouth 2 (two) times daily.            Durable Medical Equipment  (From admission, onward)         Start     Ordered   05/17/20 1104  For home use only DME lightweight manual wheelchair with seat cushion  Once       Comments: Patient suffers from walker which impairs their ability to perform daily activities like ambulate in the home.  A walker will not resolve  issue with performing activities of daily living. A wheelchair will allow patient to safely perform daily activities. Patient is not able to propel themselves in the home using a standard weight wheelchair due to weakness. Patient can self propel in the lightweight wheelchair. Length of need lifetime. Accessories: elevating leg rests (ELRs), wheel locks, extensions and anti-tippers.   05/17/20 1104   05/17/20 1103  For home use only DME Walker rolling  Once       Question Answer Comment  Walker: With Pace   Patient needs a walker to treat with the following condition Weakness      05/17/20 1103           Discharge Care Instructions  (From admission, onward)         Start     Ordered   05/17/20 0000  Discharge wound care:       Comments: As noted above per Orthopedics instruction.   05/17/20 1107           Follow-up Information    Persons, Bevely Palmer, PA In 1 week.    Specialty: Orthopedic Surgery Contact information: [address] [place] [address] [phone]               Major procedures and Radiology Reports - PLEASE review detailed and final reports thoroughly  -        No results found.  Micro Results    Recent Results (from the past 240 hour(s))  SARS CORONAVIRUS 2 (TAT 6-24 HRS) Nasopharyngeal Nasopharyngeal Swab     Status: None   Collection Time: 05/15/20 12:03 AM   Specimen: Nasopharyngeal Swab  Result Value Ref Range Status   SARS Coronavirus 2 NEGATIVE NEGATIVE Final    Comment: (NOTE) SARS-CoV-2 target nucleic acids are NOT DETECTED.  The SARS-CoV-2 RNA is generally detectable in upper and lower respiratory specimens during the acute phase of infection. Negative results do not preclude SARS-CoV-2 infection, do not rule out co-infections with other pathogens, and should not be used as the sole basis for treatment or other patient management decisions. Negative results must be combined with clinical observations, patient history, and epidemiological information. The expected result is Negative.  Fact Sheet for Patients: SugarRoll.be  Fact Sheet for Healthcare Providers: https://www.woods-mathews.com/  This test is not yet approved or cleared  by the [place] and  has been authorized for detection and/or diagnosis of SARS-CoV-2 by FDA under an Emergency Use Authorization (EUA). This EUA will remain  in effect (meaning this test can be used) for the duration of the COVID-19 declaration under Se ction 564(b)(1) of the Act, 21 U.S.C. section 360bbb-3(b)(1), unless the authorization is terminated or revoked sooner.  Performed at [facility], [place] [address] [place], [place] [address]     Today   Subjective    Izaia Say feels much improved since admission.  Feels ready to go home.  Denies chest pain, shortness of breath or abdominal pain.   Feels they can take care of themselves with the resources they have at home.  Objective   Blood pressure (!) 148/85, pulse 91, temperature 97.7 F (36.5 C), temperature source Oral, resp. rate 16, height 6' (1.829 m), weight 110.7 kg, SpO2 98 %.   Intake/Output Summary (Last 24 hours) at 05/17/2020 1807 Last data filed at 05/17/2020 1400 Gross per 24 hour  Intake 800 ml  Output 2800 ml  Net -2000 ml    Exam General: Patient appears well and in good spirits sitting up in bed in no acute distress.  Eyes: sclera anicteric, conjuctiva mild injection bilaterally CVS: S1-S2, regular  Respiratory:  decreased air entry bilaterally secondary to decreased inspiratory effort, rales at bases  GI: NABS, soft, NT  LE: No edema.  Neuro: A/O x 3, Moving all extremities equally with normal strength, CN 3-12 intact, grossly nonfocal.  Psych: patient is logical and coherent, judgement and insight appear normal, mood and affect appropriate to situation.    Data Review   CBC w Diff:  Lab Results  Component Value Date   WBC 5.9 05/17/2020   HGB 8.4 (L) 05/17/2020   HCT 27.1 (L) 05/17/2020   PLT 273 05/17/2020   LYMPHOPCT 4 05/14/2020   MONOPCT 4 05/14/2020   EOSPCT 0 05/14/2020   BASOPCT 0 05/14/2020    CMP:  Lab Results  Component Value Date   NA 135 05/17/2020   K 3.8 05/17/2020   CL 99 05/17/2020   CO2 26 05/17/2020   BUN 7 05/17/2020   CREATININE 0.70 05/17/2020   CREATININE 0.52 (L) 05/14/2020   PROT 7.3 05/14/2020   ALBUMIN 2.9 (L) 05/14/2020   BILITOT 0.9 05/14/2020   ALKPHOS 81 05/14/2020   AST 19 05/14/2020   ALT 10 05/14/2020  .   Total Time in preparing paper work, data evaluation and todays exam - 35 minutes  Vashti Hey M.D on 05/17/2020 at 6:07 PM  [facility]   Office  [phone]

## 2020-05-17 NOTE — TOC Transition Note (Signed)
Transition of Care [facility]) - CM/SW Discharge Note   Patient Details  Name: HARMON BOMMARITO MRN: [id number] Date of Birth: Feb 21, 1966  [facility] CM/SW Contact:  Kermit Balo, RN Phone Number: 05/17/2020, 11:25 AM   Clinical Narrative:    Pt discharging home with [facility] services ordered but unable to find a HH agency to accept the medicaid.  Pt with orders for wheelchair and walker. Per [employer] pt received a wheelchair in August. CM called the patient and he verified this but states his wheelchair is falling apart. CM updated [employer] and they dont have another wheelchair in his size currently. CM called and sent orders for wheelchair to [facility] with pt permission. They have to get prior auth for the chair and will deliver it to the home once approved. Pt is aware and in agreement.  Walker delivered to the room per [employer]. Unable to arrange [facility] services currently. Pt and MD aware.  Pt has transport home via [employer] through his medicaid.     Final next level of care: Home w Home Health Services Barriers to Discharge: No Barriers Identified   Patient Goals and CMS Choice   CMS Medicare.gov Compare Post Acute Care list provided to:: Patient Choice offered to / list presented to : Patient  Discharge Placement                       Discharge Plan and Services                DME Arranged: [name] rolling,Wheelchair manual DME Agency: [employer] Date DME Agency Contacted: 05/17/20   Representative spoke with at DME Agency: Velna Hatchet [facility] Arranged: PT          Social Determinants of Health (SDOH) Interventions     Readmission Risk Interventions No flowsheet data found.

## 2020-05-20 ENCOUNTER — Ambulatory Visit: Payer: Medicaid Other | Admitting: Physician Assistant

## 2020-05-20 ENCOUNTER — Ambulatory Visit (INDEPENDENT_AMBULATORY_CARE_PROVIDER_SITE_OTHER): Payer: Medicaid Other | Admitting: Physician Assistant

## 2020-05-20 ENCOUNTER — Telehealth: Payer: Self-pay | Admitting: Orthopedic Surgery

## 2020-05-20 ENCOUNTER — Encounter: Payer: Self-pay | Admitting: Physician Assistant

## 2020-05-20 DIAGNOSIS — L97514 Non-pressure chronic ulcer of other part of right foot with necrosis of bone: Secondary | ICD-10-CM

## 2020-05-20 MED ORDER — DOXYCYCLINE HYCLATE 100 MG PO TABS: 100.0000 mg | ORAL_TABLET | Freq: Two times a day (BID) | ORAL | 0 refills | Status: DC

## 2020-05-20 NOTE — Telephone Encounter (Signed)
[facility] called stating a rx was written for doxycycline with a quantity of 30 but that will only last the pt 15 days; so she would like to know if we plan to take him off of this rx after this time or was the rx supposed to be for a quantity of 60 to last him 30 days? Would like a CB with answer  [phone] * Can speak with anyone who answers

## 2020-05-20 NOTE — Telephone Encounter (Signed)
S/p transsmet amputation last Wednesday in office this am. Pharm calling to question if pt needs 15 day or 30 day rx for ABX given today.

## 2020-05-20 NOTE — Progress Notes (Signed)
Office Visit Note   Patient: [name]           Date of Birth: 1966/03/03           MRN: [id number] Visit Date: 05/20/2020              Requested by: Alvina Filbert, MD [address] [place],  [place] [address] PCP: Alvina Filbert, MD  Chief Complaint  Patient presents with  . Right Foot - Routine Post Op    05/15/20 right transmet amputation       HPI: This is a pleasant gentleman who is 5 days status post right transmetatarsal amputation.  He did discharge from the hospital Friday night.  He said he is taken a few falls and he states that the wound VAC stopped working last night so he took it off but left the sponge in place.  He denies any fever or chills.  Assessment & Plan: Visit Diagnoses: No diagnosis found.  Plan: Continue nonweightbearing and elevation.  Should start daily cleansing with Dial soap.  I did give him a few dressing supplies.  I would like him to be on doxycycline follow-up in 1 week  Follow-Up Instructions: No follow-ups on file.   Ortho Exam  Patient is alert, oriented, no adenopathy, well-dressed, normal affect, normal respiratory effort. Moderate soft tissue swelling some erythema but no ascending cellulitis colitis past the midfoot.  Although swollen the wound edges are well opposed.  He does have a foul odor.  Imaging: No results found. No images are attached to the encounter.  Labs: Lab Results  Component Value Date   HGBA1C 9.7 (H) 05/15/2020   HGBA1C 11.4 (H) 12/13/2019   HGBA1C 9.7 (H) 11/09/2018   REPTSTATUS 07/14/2017 FINAL 07/09/2017   REPTSTATUS 07/14/2017 FINAL 07/09/2017   CULT  07/09/2017    NO GROWTH 5 DAYS Performed at [facility], [address] [place], [place] [address]    CULT  07/09/2017    NO GROWTH 5 DAYS Performed at [facility], [address], [place], [place] [address]      Lab Results  Component Value Date   ALBUMIN 2.9 (L) 05/14/2020   ALBUMIN 2.9 (L) 12/13/2019   ALBUMIN 2.5 (L) 08/31/2019     Lab Results  Component Value Date   MG 1.5 (L) 05/17/2020   MG 1.5 (L) 05/16/2020   MG 1.4 (L) 07/11/2017   No results found for: VD25OH  No results found for: PREALBUMIN CBC EXTENDED Latest Ref Rng & Units 05/17/2020 05/16/2020 05/14/2020  WBC 4.0 - 10.5 K/uL 5.9 6.0 18.1(H)  RBC 4.22 - 5.81 MIL/uL 3.18(L) 3.06(L) 3.71(L)  HGB 13.0 - 17.0 g/dL 7.8(M) 8.3(L) 9.9(L)  HCT 39.0 - 52.0 % 27.1(L) 25.9(L) 31.7(L)  PLT 150 - 400 K/uL 273 257 296  NEUTROABS 1.7 - 7.7 K/uL - - 16.5(H)  LYMPHSABS 0.7 - 4.0 K/uL - - 0.7     There is no height or weight on file to calculate BMI.  Orders:  No orders of the defined types were placed in this encounter.  Meds ordered this encounter  Medications  . doxycycline (VIBRA-TABS) 100 MG tablet    Sig: Take 1 tablet (100 mg total) by mouth 2 (two) times daily.    Dispense:  30 tablet    Refill:  0     Procedures: No procedures performed  Clinical Data: No additional findings.  ROS:  All other systems negative, except as noted in the HPI. Review of  Systems  Objective: Vital Signs: There were no vitals taken for this visit.  Specialty Comments:  No specialty comments available.  PMFS History: Patient Active Problem List   Diagnosis Date Noted  . Foot ulcer (HCC) 05/15/2020  . Sepsis (HCC) 05/15/2020  . COPD (chronic obstructive pulmonary disease) (HCC) 05/15/2020  . Diabetic foot infection (HCC)   . Acquired absence of left leg below knee (HCC) 12/29/2019  . Abscess of left foot 12/13/2019  . Diabetes mellitus type II, non insulin dependent (HCC) 07/09/2017  . Subacute osteomyelitis, left ankle and foot (HCC) 07/09/2017  . Hypokalemia 07/09/2017  . Hyponatremia 07/09/2017  . AKI (acute kidney injury) (HCC) 07/09/2017  . Symptomatic anemia 07/09/2017  . Essential hypertension 07/09/2017  . Depression with anxiety 07/09/2017  . Venous thrombosis 07/09/2017   Past Medical History:  Diagnosis Date  . Abscess of right foot    . ADHD   . Anemia   . Anxiety   . Arthritis   . Asthma   . COPD (chronic obstructive pulmonary disease) (HCC)   . Diabetes mellitus   . Diabetic foot ulcer (HCC) 07/09/2017  . 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 Foot osteomyelitis, left (HCC)   . GERD (gastroesophageal reflux disease)   . Gout   . History of blood transfusion   . Hypertension   . Myocardial infarction [facility])    ? 2008,  states he did not have a cath done and never saw a cardiologist  . Neuropathy   . Pancreatitis   . Stroke [facility])    TIA- no residual  . Wears glasses     No family history on file.  Past Surgical History:  Procedure Laterality Date  . AMPUTATION Left 11/09/2018   Procedure: LEFT TRANSMETATARSAL AMPUTATION;  Surgeon: Nadara Mustard, MD;  Location[facility] OR;  Service: Orthopedics;  Laterality: Left;  . AMPUTATION Left 12/13/2019   Procedure: LEFT BELOW KNEE AMPUTATION;  Surgeon: Nadara Mustard, MD;  Location: [facility] OR;  Service: Orthopedics;  Laterality: Left;  . AMPUTATION Right 05/15/2020   Procedure: RIGHT TRANSMETATARSAL AMPUTATION;  Surgeon: Nadara Mustard, MD;  Location: [facility] OR;  Service: Orthopedics;  Laterality: Right;  . APPENDECTOMY    . FOOT SURGERY Left   . LAPAROSCOPIC APPENDECTOMY  07/28/2011   Procedure: APPENDECTOMY LAPAROSCOPIC;  Surgeon: Fabio Bering, MD;  Location: [facility] ORS;  Service: General;  Laterality: N/A;  . LEG SURGERY     Social History   Occupational History  . Not on file  Tobacco Use  . Smoking status: Former Smoker    Types: Cigarettes  . Smokeless tobacco: Never Used  . Tobacco comment: quit smoking cigarettes 27 years ago 09/05/19  Vaping Use  . Vaping Use: Never used  Substance and Sexual Activity  . Alcohol use: Yes    Comment: 12/13/19- 2 40oz beer a week  . Drug use: Yes    Types: Marijuana    Comment: CBD every 3 days  . Sexual activity: Not on file

## 2020-05-20 NOTE — Telephone Encounter (Signed)
I called and lm on vm for the pharm to advise that the rx is for a 15 day supply and so the qty for 30 is correct. To call with any questions.

## 2020-05-20 NOTE — Telephone Encounter (Signed)
Yes amount is correct 30

## 2020-05-27 ENCOUNTER — Encounter: Payer: Self-pay | Admitting: Physician Assistant

## 2020-05-27 ENCOUNTER — Ambulatory Visit (INDEPENDENT_AMBULATORY_CARE_PROVIDER_SITE_OTHER): Payer: Medicaid Other | Admitting: Orthopedic Surgery

## 2020-05-27 DIAGNOSIS — Z89431 Acquired absence of right foot: Secondary | ICD-10-CM

## 2020-05-27 DIAGNOSIS — L97511 Non-pressure chronic ulcer of other part of right foot limited to breakdown of skin: Secondary | ICD-10-CM

## 2020-05-27 MED ORDER — DOXYCYCLINE HYCLATE 100 MG PO TABS: 100.0000 mg | ORAL_TABLET | Freq: Two times a day (BID) | ORAL | 0 refills | Status: DC

## 2020-05-27 MED ORDER — NITROGLYCERIN 0.2 MG/HR TD PT24: 0.2000 mg | MEDICATED_PATCH | Freq: Every day | TRANSDERMAL | 12 refills | Status: DC

## 2020-05-27 NOTE — Progress Notes (Signed)
Office Visit Note   Patient: [name]           Date of Birth: 07-Jul-1965           MRN: [id number] Visit Date: 05/27/2020              Requested by: Alvina Filbert, MD [address] [place],  [place] [address] PCP: Alvina Filbert, MD  Chief Complaint  Patient presents with  . Right Foot - Routine Post Op    05/15/20 right transmet amputation       HPI: Patient is a 55 year old gentleman who presents 2 weeks status post right transmetatarsal amputation.  Patient has had progressive wound dehiscence.  Patient is status post a left transtibial amputation.  Patient currently ambulates with a walker and a postoperative shoe.  Assessment & Plan: Visit Diagnoses: No diagnosis found.  Plan: Will have him resume nitroglycerin patch start doxycycline continue with Dial soap cleansing minimize weightbearing and maximize elevation.  Follow-Up Instructions: Return in about 1 week (around 06/03/2020).   Ortho Exam  Patient is alert, oriented, no adenopathy, well-dressed, normal affect, normal respiratory effort. Examination patient has a palpable dorsalis pedis pulse.  He has good hair growth down to the ankle his problem seems to be microcirculatory in nature.  There is progressive wound dehiscence there is no purulence no exposed bone or tendon.  The wound was debrided with a 4 x 4 gauze and there was good petechial bleeding a dry dressing was applied.  Imaging: No results found. No images are attached to the encounter.  Labs: Lab Results  Component Value Date   HGBA1C 9.7 (H) 05/15/2020   HGBA1C 11.4 (H) 12/13/2019   HGBA1C 9.7 (H) 11/09/2018   REPTSTATUS 07/14/2017 FINAL 07/09/2017   REPTSTATUS 07/14/2017 FINAL 07/09/2017   CULT  07/09/2017    NO GROWTH 5 DAYS Performed at [facility]: [facility], [address], [place], [place] [address]    CULT  07/09/2017    NO GROWTH 5 DAYS Performed at [facility], [address], [place], [place] [address]      Lab Results   Component Value Date   ALBUMIN 2.9 (L) 05/14/2020   ALBUMIN 2.9 (L) 12/13/2019   ALBUMIN 2.5 (L) 08/31/2019    Lab Results  Component Value Date   MG 1.5 (L) 05/17/2020   MG 1.5 (L) 05/16/2020   MG 1.4 (L) 07/11/2017   No results found for: VD25OH  No results found for: PREALBUMIN CBC EXTENDED Latest Ref Rng & Units 05/17/2020 05/16/2020 05/14/2020  WBC 4.0 - 10.5 K/uL 5.9 6.0 18.1(H)  RBC 4.22 - 5.81 MIL/uL 3.18(L) 3.06(L) 3.71(L)  HGB 13.0 - 17.0 g/dL 2.7(O) 8.3(L) 9.9(L)  HCT 39.0 - 52.0 % 27.1(L) 25.9(L) 31.7(L)  PLT 150 - 400 K/uL 273 257 296  NEUTROABS 1.7 - 7.7 K/uL - - 16.5(H)  LYMPHSABS 0.7 - 4.0 K/uL - - 0.7     There is no height or weight on file to calculate BMI.  Orders:  No orders of the defined types were placed in this encounter.  Meds ordered this encounter  Medications  . nitroGLYCERIN (NITRODUR - DOSED IN MG/24 HR) 0.2 mg/hr patch    Sig: Place 1 patch (0.2 mg total) onto the skin daily.    Dispense:  30 patch    Refill:  12  . doxycycline (VIBRA-TABS) 100 MG tablet    Sig: Take 1 tablet (100 mg total) by mouth 2 (two) times daily.  Dispense:  60 tablet    Refill:  0     Procedures: No procedures performed  Clinical Data: No additional findings.  ROS:  All other systems negative, except as noted in the HPI. Review of Systems  Objective: Vital Signs: There were no vitals taken for this visit.  Specialty Comments:  No specialty comments available.  PMFS History: Patient Active Problem List   Diagnosis Date Noted  . Foot ulcer (HCC) 05/15/2020  . Sepsis (HCC) 05/15/2020  . COPD (chronic obstructive pulmonary disease) (HCC) 05/15/2020  . Diabetic foot infection (HCC)   . Acquired absence of left leg below knee (HCC) 12/29/2019  . Abscess of left foot 12/13/2019  . Diabetes mellitus type II, non insulin dependent (HCC) 07/09/2017  . Subacute osteomyelitis, left ankle and foot (HCC) 07/09/2017  . Hypokalemia 07/09/2017  .  Hyponatremia 07/09/2017  . AKI (acute kidney injury) (HCC) 07/09/2017  . Symptomatic anemia 07/09/2017  . Essential hypertension 07/09/2017  . Depression with anxiety 07/09/2017  . Venous thrombosis 07/09/2017   Past Medical History:  Diagnosis Date  . Abscess of right foot   . ADHD   . Anemia   . Anxiety   . Arthritis   . Asthma   . COPD (chronic obstructive pulmonary disease) (HCC)   . Diabetes mellitus   . Diabetic foot ulcer (HCC) 07/09/2017  . 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 Foot osteomyelitis, left (HCC)   . GERD (gastroesophageal reflux disease)   . Gout   . History of blood transfusion   . Hypertension   . Myocardial infarction [facility])    ? 2008,  states he did not have a cath done and never saw a cardiologist  . Neuropathy   . Pancreatitis   . Stroke [facility])    TIA- no residual  . Wears glasses     History reviewed. No pertinent family history.  Past Surgical History:  Procedure Laterality Date  . AMPUTATION Left 11/09/2018   Procedure: LEFT TRANSMETATARSAL AMPUTATION;  Surgeon: Nadara Mustard, MD;  Location: [facility] OR;  Service: Orthopedics;  Laterality: Left;  . AMPUTATION Left 12/13/2019   Procedure: LEFT BELOW KNEE AMPUTATION;  Surgeon: Nadara Mustard, MD;  Location: [facility] OR;  Service: Orthopedics;  Laterality: Left;  . AMPUTATION Right 05/15/2020   Procedure: RIGHT TRANSMETATARSAL AMPUTATION;  Surgeon: Nadara Mustard, MD;  Location: [facility] OR;  Service: Orthopedics;  Laterality: Right;  . APPENDECTOMY    . FOOT SURGERY Left   . LAPAROSCOPIC APPENDECTOMY  07/28/2011   Procedure: APPENDECTOMY LAPAROSCOPIC;  Surgeon: Fabio Bering, MD;  Location: [facility] ORS;  Service: General;  Laterality: N/A;  . LEG SURGERY     Social History   Occupational History  . Not on file  Tobacco Use  . Smoking status: Former Smoker    Types: Cigarettes  . Smokeless tobacco: Never Used  . Tobacco comment: quit smoking  cigarettes 27 years ago 09/05/19  Vaping Use  . Vaping Use: Never used  Substance and Sexual Activity  . Alcohol use: Yes    Comment: 12/13/19- 2 40oz beer a week  . Drug use: Yes    Types: Marijuana    Comment: CBD every 3 days  . Sexual activity: Not on file

## 2020-05-28 ENCOUNTER — Telehealth: Payer: Self-pay | Admitting: Orthopedic Surgery

## 2020-05-28 NOTE — Telephone Encounter (Signed)
Records 05/14/20-present faxed w/ CMN to support need for wheelchair to [facility] [phone]

## 2020-06-03 ENCOUNTER — Encounter: Payer: Self-pay | Admitting: Physician Assistant

## 2020-06-03 ENCOUNTER — Ambulatory Visit (INDEPENDENT_AMBULATORY_CARE_PROVIDER_SITE_OTHER): Payer: Medicaid Other | Admitting: Orthopedic Surgery

## 2020-06-03 DIAGNOSIS — Z89431 Acquired absence of right foot: Secondary | ICD-10-CM

## 2020-06-03 DIAGNOSIS — L97511 Non-pressure chronic ulcer of other part of right foot limited to breakdown of skin: Secondary | ICD-10-CM

## 2020-06-03 NOTE — Progress Notes (Signed)
Office Visit Note   Patient: [name]           Date of Birth: 1965-05-18           MRN: [id number] Visit Date: 06/03/2020              Requested by: Alvina Filbert, MD [address] [place],  [place] [address] PCP: Alvina Filbert, MD  Chief Complaint  Patient presents with  . Right Foot - Routine Post Op    05/15/20 right transmet amputation       HPI: Patient is a 55 year old gentleman who presents 3 weeks status post right transmetatarsal amputation he states he still has an odor decreased drainage and maceration.  Patient is on doxycycline twice a day he is using the nitroglycerin patch and is on Trental.  Assessment & Plan: Visit Diagnoses:  1. Right foot ulcer, limited to breakdown of skin (HCC)   2. History of transmetatarsal amputation of right foot (HCC)     Plan: Patient will continue with elevation nonweightbearing Dial soap cleansing twice daily dry dressing change daily continue with nitroglycerin patch change daily doxycycline and Trental.  Follow-Up Instructions: Return in about 1 week (around 06/10/2020).   Ortho Exam  Patient is alert, oriented, no adenopathy, well-dressed, normal affect, normal respiratory effort. Examination there is no cellulitis there is  wound dehiscence of approximately 5 mm no exposed bone or tendon there is an odor the drainage has decreased there is a small drop of drainage over the lateral incision.  Imaging: No results found. No images are attached to the encounter.  Labs: Lab Results  Component Value Date   HGBA1C 9.7 (H) 05/15/2020   HGBA1C 11.4 (H) 12/13/2019   HGBA1C 9.7 (H) 11/09/2018   REPTSTATUS 07/14/2017 FINAL 07/09/2017   REPTSTATUS 07/14/2017 FINAL 07/09/2017   CULT  07/09/2017    NO GROWTH 5 DAYS Performed at [facility], [address], [place], [place] [address]    CULT  07/09/2017    NO GROWTH 5 DAYS Performed at [facility], [address] [place], [place] [address]      Lab Results   Component Value Date   ALBUMIN 2.9 (L) 05/14/2020   ALBUMIN 2.9 (L) 12/13/2019   ALBUMIN 2.5 (L) 08/31/2019    Lab Results  Component Value Date   MG 1.5 (L) 05/17/2020   MG 1.5 (L) 05/16/2020   MG 1.4 (L) 07/11/2017   No results found for: VD25OH  No results found for: PREALBUMIN CBC EXTENDED Latest Ref Rng & Units 05/17/2020 05/16/2020 05/14/2020  WBC 4.0 - 10.5 K/uL 5.9 6.0 18.1(H)  RBC 4.22 - 5.81 MIL/uL 3.18(L) 3.06(L) 3.71(L)  HGB 13.0 - 17.0 g/dL 0.0(F) 8.3(L) 9.9(L)  HCT 39.0 - 52.0 % 27.1(L) 25.9(L) 31.7(L)  PLT 150 - 400 K/uL 273 257 296  NEUTROABS 1.7 - 7.7 K/uL - - 16.5(H)  LYMPHSABS 0.7 - 4.0 K/uL - - 0.7     There is no height or weight on file to calculate BMI.  Orders:  No orders of the defined types were placed in this encounter.  No orders of the defined types were placed in this encounter.    Procedures: No procedures performed  Clinical Data: No additional findings.  ROS:  All other systems negative, except as noted in the HPI. Review of Systems  Objective: Vital Signs: There were no vitals taken for this visit.  Specialty Comments:  No specialty comments available.  PMFS History: Patient Active Problem  List   Diagnosis Date Noted  . Foot ulcer (HCC) 05/15/2020  . Sepsis (HCC) 05/15/2020  . COPD (chronic obstructive pulmonary disease) (HCC) 05/15/2020  . Diabetic foot infection (HCC)   . Acquired absence of left leg below knee (HCC) 12/29/2019  . Abscess of left foot 12/13/2019  . Diabetes mellitus type II, non insulin dependent (HCC) 07/09/2017  . Subacute osteomyelitis, left ankle and foot (HCC) 07/09/2017  . Hypokalemia 07/09/2017  . Hyponatremia 07/09/2017  . AKI (acute kidney injury) (HCC) 07/09/2017  . Symptomatic anemia 07/09/2017  . Essential hypertension 07/09/2017  . Depression with anxiety 07/09/2017  . Venous thrombosis 07/09/2017   Past Medical History:  Diagnosis Date  . Abscess of right foot   . ADHD   .  Anemia   . Anxiety   . Arthritis   . Asthma   . COPD (chronic obstructive pulmonary disease) (HCC)   . Diabetes mellitus   . Diabetic foot ulcer (HCC) 07/09/2017  . 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 Foot osteomyelitis, left (HCC)   . GERD (gastroesophageal reflux disease)   . Gout   . History of blood transfusion   . Hypertension   . Myocardial infarction [facility])    ? 2008,  states he did not have a cath done and never saw a cardiologist  . Neuropathy   . Pancreatitis   . Stroke [facility])    TIA- no residual  . Wears glasses     History reviewed. No pertinent family history.  Past Surgical History:  Procedure Laterality Date  . AMPUTATION Left 11/09/2018   Procedure: LEFT TRANSMETATARSAL AMPUTATION;  Surgeon: Nadara Mustard, MD;  Location: [facility] OR;  Service: Orthopedics;  Laterality: Left;  . AMPUTATION Left 12/13/2019   Procedure: LEFT BELOW KNEE AMPUTATION;  Surgeon: Nadara Mustard, MD;  Location: [facility] OR;  Service: Orthopedics;  Laterality: Left;  . AMPUTATION Right 05/15/2020   Procedure: RIGHT TRANSMETATARSAL AMPUTATION;  Surgeon: Nadara Mustard, MD;  Location: [facility] OR;  Service: Orthopedics;  Laterality: Right;  . APPENDECTOMY    . FOOT SURGERY Left   . LAPAROSCOPIC APPENDECTOMY  07/28/2011   Procedure: APPENDECTOMY LAPAROSCOPIC;  Surgeon: Fabio Bering, MD;  Location: [facility] ORS;  Service: General;  Laterality: N/A;  . LEG SURGERY     Social History   Occupational History  . Not on file  Tobacco Use  . Smoking status: Former Smoker    Types: Cigarettes  . Smokeless tobacco: Never Used  . Tobacco comment: quit smoking cigarettes 27 years ago 09/05/19  Vaping Use  . Vaping Use: Never used  Substance and Sexual Activity  . Alcohol use: Yes    Comment: 12/13/19- 2 40oz beer a week  . Drug use: Yes    Types: Marijuana    Comment: CBD every 3 days  . Sexual activity: Not on file

## 2020-06-10 ENCOUNTER — Ambulatory Visit (INDEPENDENT_AMBULATORY_CARE_PROVIDER_SITE_OTHER): Payer: Medicaid Other | Admitting: Physician Assistant

## 2020-06-10 ENCOUNTER — Encounter: Payer: Self-pay | Admitting: Orthopedic Surgery

## 2020-06-10 VITALS — Ht 72.0 in | Wt 244.0 lb

## 2020-06-10 DIAGNOSIS — Z89431 Acquired absence of right foot: Secondary | ICD-10-CM

## 2020-06-10 DIAGNOSIS — L97511 Non-pressure chronic ulcer of other part of right foot limited to breakdown of skin: Secondary | ICD-10-CM

## 2020-06-10 NOTE — Progress Notes (Signed)
Office Visit Note   Patient: [name]           Date of Birth: 05/05/1965           MRN: [id number] Visit Date: 06/10/2020              Requested by: Alvina Filbert, MD [address] [place],  [place] [address] PCP: Alvina Filbert, MD  Chief Complaint  Patient presents with  . Right Foot - Follow-up    05-15-20 Right transmetatarsal amputation      HPI: Patient follows up 1 week he is 3 weeks status post right transmetatarsal amputation he feels about the same currently on doxycycline.  Using Trental and nitroglycerin patch  Assessment & Plan: Visit Diagnoses: No diagnosis found.  Plan: Continue current treatment we will follow up in a week or sooner.  If any changes  Follow-Up Instructions: No follow-ups on file.   Ortho Exam  Patient is alert, oriented, no adenopathy, well-dressed, normal affect, normal respiratory effort. Nitroglycerin patch is in place he does have wound dehiscence but it is filling in on the lateral section nicely has about 70% fibrinous tissue but there are areas of healthy vascular tissue with bleeding.  Very central portion of the wound he does have 1 area that does probe a bit deeply but not to bone no exposed bone.  No surrounding cellulitis no foul odor  Imaging: No results found. No images are attached to the encounter.  Labs: Lab Results  Component Value Date   HGBA1C 9.7 (H) 05/15/2020   HGBA1C 11.4 (H) 12/13/2019   HGBA1C 9.7 (H) 11/09/2018   REPTSTATUS 07/14/2017 FINAL 07/09/2017   REPTSTATUS 07/14/2017 FINAL 07/09/2017   CULT  07/09/2017    NO GROWTH 5 DAYS Performed at [facility], [address], [place], [place] [address]    CULT  07/09/2017    NO GROWTH 5 DAYS Performed at [facility], [address], [place], [place] [address]      Lab Results  Component Value Date   ALBUMIN 2.9 (L) 05/14/2020   ALBUMIN 2.9 (L) 12/13/2019   ALBUMIN 2.5 (L) 08/31/2019    Lab Results  Component Value Date   MG 1.5 (L)  05/17/2020   MG 1.5 (L) 05/16/2020   MG 1.4 (L) 07/11/2017   No results found for: VD25OH  No results found for: PREALBUMIN CBC EXTENDED Latest Ref Rng & Units 05/17/2020 05/16/2020 05/14/2020  WBC 4.0 - 10.5 K/uL 5.9 6.0 18.1(H)  RBC 4.22 - 5.81 MIL/uL 3.18(L) 3.06(L) 3.71(L)  HGB 13.0 - 17.0 g/dL 7.2(Z) 8.3(L) 9.9(L)  HCT 39.0 - 52.0 % 27.1(L) 25.9(L) 31.7(L)  PLT 150 - 400 K/uL 273 257 296  NEUTROABS 1.7 - 7.7 K/uL - - 16.5(H)  LYMPHSABS 0.7 - 4.0 K/uL - - 0.7     Body mass index is 33.09 kg/m.  Orders:  No orders of the defined types were placed in this encounter.  No orders of the defined types were placed in this encounter.    Procedures: No procedures performed  Clinical Data: No additional findings.  ROS:  All other systems negative, except as noted in the HPI. Review of Systems  Objective: Vital Signs: Ht 6' (1.829 m)   Wt 244 lb (110.7 kg)   BMI 33.09 kg/m   Specialty Comments:  No specialty comments available.  PMFS History: Patient Active Problem List   Diagnosis Date Noted  . Foot ulcer (HCC) 05/15/2020  . Sepsis (HCC) 05/15/2020  .  COPD (chronic obstructive pulmonary disease) (HCC) 05/15/2020  . Diabetic foot infection (HCC)   . Acquired absence of left leg below knee (HCC) 12/29/2019  . Abscess of left foot 12/13/2019  . Diabetes mellitus type II, non insulin dependent (HCC) 07/09/2017  . Subacute osteomyelitis, left ankle and foot (HCC) 07/09/2017  . Hypokalemia 07/09/2017  . Hyponatremia 07/09/2017  . AKI (acute kidney injury) (HCC) 07/09/2017  . Symptomatic anemia 07/09/2017  . Essential hypertension 07/09/2017  . Depression with anxiety 07/09/2017  . Venous thrombosis 07/09/2017   Past Medical History:  Diagnosis Date  . Abscess of right foot   . ADHD   . Anemia   . Anxiety   . Arthritis   . Asthma   . COPD (chronic obstructive pulmonary disease) (HCC)   . Diabetes mellitus   . Diabetic foot ulcer (HCC) 07/09/2017  . Diabetic  ulcer of back associated with type 2 diabetes mellitus, limited to breakdown of skin (HCC)   . Foot osteomyelitis, left (HCC)   . GERD (gastroesophageal reflux disease)   . Gout   . History of blood transfusion   . Hypertension   . Myocardial infarction [facility])    ? 2008,  states he did not have a cath done and never saw a cardiologist  . Neuropathy   . Pancreatitis   . Stroke [facility])    TIA- no residual  . Wears glasses     History reviewed. No pertinent family history.  Past Surgical History:  Procedure Laterality Date  . AMPUTATION Left 11/09/2018   Procedure: LEFT TRANSMETATARSAL AMPUTATION;  Surgeon: Nadara Mustard, MD;  Location: [facility] OR;  Service: Orthopedics;  Laterality: Left;  . AMPUTATION Left 12/13/2019   Procedure: LEFT BELOW KNEE AMPUTATION;  Surgeon: Nadara Mustard, MD;  Location: [facility] OR;  Service: Orthopedics;  Laterality: Left;  . AMPUTATION Right 05/15/2020   Procedure: RIGHT TRANSMETATARSAL AMPUTATION;  Surgeon: Nadara Mustard, MD;  Location: [facility] OR;  Service: Orthopedics;  Laterality: Right;  . APPENDECTOMY    . FOOT SURGERY Left   . LAPAROSCOPIC APPENDECTOMY  07/28/2011   Procedure: APPENDECTOMY LAPAROSCOPIC;  Surgeon: Fabio Bering, MD;  Location: [facility] ORS;  Service: General;  Laterality: N/A;  . LEG SURGERY     Social History   Occupational History  . Not on file  Tobacco Use  . Smoking status: Former Smoker    Types: Cigarettes  . Smokeless tobacco: Never Used  . Tobacco comment: quit smoking cigarettes 27 years ago 09/05/19  Vaping Use  . Vaping Use: Never used  Substance and Sexual Activity  . Alcohol use: Yes    Comment: 12/13/19- 2 40oz beer a week  . Drug use: Yes    Types: Marijuana    Comment: CBD every 3 days  . Sexual activity: Not on file

## 2020-06-17 ENCOUNTER — Other Ambulatory Visit: Payer: Self-pay | Admitting: Physician Assistant

## 2020-06-17 ENCOUNTER — Ambulatory Visit (INDEPENDENT_AMBULATORY_CARE_PROVIDER_SITE_OTHER): Payer: Medicaid Other | Admitting: Orthopedic Surgery

## 2020-06-17 ENCOUNTER — Encounter: Payer: Self-pay | Admitting: Orthopedic Surgery

## 2020-06-17 DIAGNOSIS — T8781 Dehiscence of amputation stump: Secondary | ICD-10-CM

## 2020-06-17 NOTE — Progress Notes (Signed)
Office Visit Note   Patient: [name]           Date of Birth: Dec 15, 1965           MRN: [id number] Visit Date: 06/17/2020              Requested by: Alvina Filbert, MD [address] [place],  [place] [address] PCP: Alvina Filbert, MD  Chief Complaint  Patient presents with  . Right Foot - Routine Post Op    05/15/20 right transmet amputation       HPI: Patient is a 55 year old gentleman who presents in follow-up for right transmetatarsal amputation patient has had progressive wound dehiscence.  Patient states that he had to stop using a nitroglycerin patch due to developing a rash on the dorsum of his foot.  Patient is status post a left transtibial amputation prosthesis with [facility].  Assessment & Plan: Visit Diagnoses:  1. Dehiscence of amputation stump (HCC)     Plan: With the progressive wound dehiscence exposed bone and pain discussed that the best option would be to proceed with a transtibial amputation on the right will have him follow-up with [facility] for prosthetic fitting.  Patient states he would like to proceed with surgery on Friday.  Follow-Up Instructions: Return in about 1 week (around 06/24/2020).   Ortho Exam  Patient is alert, oriented, no adenopathy, well-dressed, normal affect, normal respiratory effort. Examination patient has good hair growth on the right leg.  He has a strong palpable dorsalis pedis and posterior tibial pulse on the right.  Examination the wound he has progressive wound dehiscence over the medial column with swelling exposed bone and necrotic tissue across the transmetatarsal amputation.  There is no ascending cellulitis no purulent drainage.  Imaging: No results found. No images are attached to the encounter.  Labs: Lab Results  Component Value Date   HGBA1C 9.7 (H) 05/15/2020   HGBA1C 11.4 (H) 12/13/2019   HGBA1C 9.7 (H) 11/09/2018   REPTSTATUS 07/14/2017 FINAL 07/09/2017   REPTSTATUS 07/14/2017 FINAL 07/09/2017   CULT   07/09/2017    NO GROWTH 5 DAYS Performed at [facility], [address] [place], [place] [address]    CULT  07/09/2017    NO GROWTH 5 DAYS Performed at [facility], [address], [place], [place] [address]      Lab Results  Component Value Date   ALBUMIN 2.9 (L) 05/14/2020   ALBUMIN 2.9 (L) 12/13/2019   ALBUMIN 2.5 (L) 08/31/2019    Lab Results  Component Value Date   MG 1.5 (L) 05/17/2020   MG 1.5 (L) 05/16/2020   MG 1.4 (L) 07/11/2017   No results found for: VD25OH  No results found for: PREALBUMIN CBC EXTENDED Latest Ref Rng & Units 05/17/2020 05/16/2020 05/14/2020  WBC 4.0 - 10.5 K/uL 5.9 6.0 18.1(H)  RBC 4.22 - 5.81 MIL/uL 3.18(L) 3.06(L) 3.71(L)  HGB 13.0 - 17.0 g/dL 0.9(G) 8.3(L) 9.9(L)  HCT 39.0 - 52.0 % 27.1(L) 25.9(L) 31.7(L)  PLT 150 - 400 K/uL 273 257 296  NEUTROABS 1.7 - 7.7 K/uL - - 16.5(H)  LYMPHSABS 0.7 - 4.0 K/uL - - 0.7     There is no height or weight on file to calculate BMI.  Orders:  No orders of the defined types were placed in this encounter.  No orders of the defined types were placed in this encounter.    Procedures: No procedures performed  Clinical Data: No additional findings.  ROS:  All  other systems negative, except as noted in the HPI. Review of Systems  Objective: Vital Signs: There were no vitals taken for this visit.  Specialty Comments:  No specialty comments available.  PMFS History: Patient Active Problem List   Diagnosis Date Noted  . Foot ulcer (HCC) 05/15/2020  . Sepsis (HCC) 05/15/2020  . COPD (chronic obstructive pulmonary disease) (HCC) 05/15/2020  . Diabetic foot infection (HCC)   . Acquired absence of left leg below knee (HCC) 12/29/2019  . Abscess of left foot 12/13/2019  . Diabetes mellitus type II, non insulin dependent (HCC) 07/09/2017  . Subacute osteomyelitis, left ankle and foot (HCC) 07/09/2017  . Hypokalemia 07/09/2017  . Hyponatremia 07/09/2017  . AKI (acute kidney injury) (HCC)  07/09/2017  . Symptomatic anemia 07/09/2017  . Essential hypertension 07/09/2017  . Depression with anxiety 07/09/2017  . Venous thrombosis 07/09/2017   Past Medical History:  Diagnosis Date  . Abscess of right foot   . ADHD   . Anemia   . Anxiety   . Arthritis   . Asthma   . COPD (chronic obstructive pulmonary disease) (HCC)   . Diabetes mellitus   . Diabetic foot ulcer (HCC) 07/09/2017  . 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 Foot osteomyelitis, left (HCC)   . GERD (gastroesophageal reflux disease)   . Gout   . History of blood transfusion   . Hypertension   . Myocardial infarction [facility])    ? 2008,  states he did not have a cath done and never saw a cardiologist  . Neuropathy   . Pancreatitis   . Stroke [facility])    TIA- no residual  . Wears glasses     History reviewed. No pertinent family history.  Past Surgical History:  Procedure Laterality Date  . AMPUTATION Left 11/09/2018   Procedure: LEFT TRANSMETATARSAL AMPUTATION;  Surgeon: Nadara Mustard, MD;  Location: [facility] OR;  Service: Orthopedics;  Laterality: Left;  . AMPUTATION Left 12/13/2019   Procedure: LEFT BELOW KNEE AMPUTATION;  Surgeon: Nadara Mustard, MD;  Location: [facility] OR;  Service: Orthopedics;  Laterality: Left;  . AMPUTATION Right 05/15/2020   Procedure: RIGHT TRANSMETATARSAL AMPUTATION;  Surgeon: Nadara Mustard, MD;  Location: [facility] OR;  Service: Orthopedics;  Laterality: Right;  . APPENDECTOMY    . FOOT SURGERY Left   . LAPAROSCOPIC APPENDECTOMY  07/28/2011   Procedure: APPENDECTOMY LAPAROSCOPIC;  Surgeon: Fabio Bering, MD;  Location: [facility] ORS;  Service: General;  Laterality: N/A;  . LEG SURGERY     Social History   Occupational History  . Not on file  Tobacco Use  . Smoking status: Former Smoker    Types: Cigarettes  . Smokeless tobacco: Never Used  . Tobacco comment: quit smoking cigarettes 27 years ago 09/05/19  Vaping Use  . Vaping Use: Never  used  Substance and Sexual Activity  . Alcohol use: Yes    Comment: 12/13/19- 2 40oz beer a week  . Drug use: Yes    Types: Marijuana    Comment: CBD every 3 days  . Sexual activity: Not on file

## 2020-06-18 ENCOUNTER — Telehealth: Payer: Self-pay

## 2020-06-18 NOTE — Telephone Encounter (Signed)
Patient calling with questions about his surgery time Friday

## 2020-06-20 ENCOUNTER — Other Ambulatory Visit: Payer: Self-pay

## 2020-06-20 ENCOUNTER — Encounter (HOSPITAL_COMMUNITY): Payer: Self-pay | Admitting: Vascular Surgery

## 2020-06-20 NOTE — Progress Notes (Signed)
Called pt to give pre-op instructions, first thing pt said was he was not feeling well "I feel achy and gripey and I really just don't want to have my surgery tomorrow"  Clarified with pt that he did not have any covid symptoms, no cough, fever, chills, no chest pain or new SOB.  Pt informed to call Dr. Audrie Lia office in the morning to reschedule surgery. OR scheduler called to notify that pt will not be coming tomorrow for surgery.

## 2020-06-20 NOTE — Anesthesia Preprocedure Evaluation (Deleted)
Anesthesia Evaluation    Airway        Dental   Pulmonary former smoker,           Cardiovascular hypertension,      Neuro/Psych    GI/Hepatic   Endo/Other  diabetes  Renal/GU      Musculoskeletal   Abdominal   Peds  Hematology   Anesthesia Other Findings   Reproductive/Obstetrics                             Anesthesia Physical Anesthesia Plan  ASA:   Anesthesia Plan:    Post-op Pain Management:    Induction:   PONV Risk Score and Plan:   Airway Management Planned:   Additional Equipment:   Intra-op Plan:   Post-operative Plan:   Informed Consent:   Plan Discussed with:   Anesthesia Plan Comments: (PAT note written 06/20/2020 by Shonna Chock, PA-C. )        Anesthesia Quick Evaluation

## 2020-06-20 NOTE — Progress Notes (Signed)
Anesthesia Chart Review:  Case: [id number] Date/Time: 06/21/20 0950   Procedure: RIGHT BELOW KNEE AMPUTATION (Right Knee)   Anesthesia type: Choice   Pre-op diagnosis: Dehiscence Right Transmetatarsal Amputation   Location: [facility] OR ROOM 05 / [place] OR   Surgeons: Newt Minion, MD      DISCUSSION: Patient is a 55 year old male scheduled for the above procedure.  He is status post right transmetatarsal amputation on 05/15/2020.  He was last seen in follow-up on 06/17/2020 with progressive wound dehiscence.  Right transtibial amputation recommended.  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 HE thought he may have been told he had an MI ~ 2008, but denied history of seeing a cardiology or having a cardiac cath; negative cardiac markers 06/26/07 & 11/24/07; had ED visit 01/09/09 for chest pain with negative ED work-up other than elevated alcohol level of "289" and + UDS benzodiazepine and opioids, no mention of CAD/MI in the note). Notes indicate history of polysubstance abuse (CBD, methamphetamine use with ED visit 03/23/19, alcohol).  Last documented alcohol intake noted lists 2 40-oz beer/week.   DM2 on insulin, last A1c 9.7 on 05/15/20.  Recently underwent right TMA 2/48/25 without complication. Last COVID-19 test seen is from 05/15/20, so anticipate need for same day testing.   Last EKG seen is > 1 year ago (03/23/19). Anesthesia team to evaluate on the day of surgery.     VS:   Wt Readings from Last 3 Encounters:  06/10/20 110.7 kg  05/15/20 110.7 kg  04/18/20 110.7 kg   BP Readings from Last 3 Encounters:  05/17/20 (!) 148/85  05/14/20 (!)  158/76  12/14/19 128/76   Pulse Readings from Last 3 Encounters:  05/17/20 91  05/14/20 (!) 133  12/14/19 (!) 106    PROVIDERS: Abran Richard, MD is PCP    LABS: For day of surgery. Last results include: Lab Results  Component Value Date   WBC 5.9 05/17/2020   HGB 8.4 (L) 05/17/2020   HCT 27.1 (L) 05/17/2020   PLT 273 05/17/2020   GLUCOSE 164 (H) 05/17/2020   ALT 10 05/14/2020   AST 19 05/14/2020   NA 135 05/17/2020   K 3.8 05/17/2020   CL 99 05/17/2020   CREATININE 0.70 05/17/2020   BUN 7 05/17/2020   CO2 26 05/17/2020   HGBA1C 9.7 (H) 05/15/2020     EKG: 03/23/19: ST at 115 bpm with early R wave transition.    CV: TEE 06/08/17 ([place]): Interpretation Summary The left ventricle is normal in size.  The left ventricular ejection fraction is normal (55-60%).  The right ventricle is normal in structure and function.  There is no left atrial thrombus or mass.  There is no thrombus seen in the appendage.  No evidence of valvular vegetation.  No significant valvular stenosis or regurgitation.  There is no pericardial effusion.    TTE 06/03/17 ([place]): A complete two-dimensional transthoracic echocardiogram with color flow Doppler and spectral Doppler was performed. Saline contrast injection was performed. The left ventricle is normal in size, wall thickness and wall motion with ejection fraction of 55-60%. The aortic valve is trileaflet. Unable to adequately determine diastolic dysfunction. Injection of contrast documented no interatrial shunt    Past Medical  History:  Diagnosis Date  . Abscess of right foot   . ADHD   . Anemia   . Anxiety   . Arthritis   . Asthma   . COPD (chronic obstructive pulmonary disease) ([place])   . Diabetes mellitus   . Diabetic foot ulcer ([place]) 07/09/2017  . Diabetic ulcer of back associated with type 2 diabetes mellitus, limited to breakdown of skin ([place])   . Foot osteomyelitis, left ([place])   .  GERD (gastroesophageal reflux disease)   . Gout   . History of blood transfusion   . Hypertension   . Myocardial infarction [facility])    ? 2008,  states he did not have a cath done and never saw a cardiologist  . Neuropathy   . Pancreatitis   . Stroke [facility])    TIA- no residual  . Wears glasses     Past Surgical History:  Procedure Laterality Date  . AMPUTATION Left 11/09/2018   Procedure: LEFT TRANSMETATARSAL AMPUTATION;  Surgeon: Newt Minion, MD;  Location: [place];  Service: Orthopedics;  Laterality: Left;  . AMPUTATION Left 12/13/2019   Procedure: LEFT BELOW KNEE AMPUTATION;  Surgeon: Newt Minion, MD;  Location: [place];  Service: Orthopedics;  Laterality: Left;  . AMPUTATION Right 05/15/2020   Procedure: RIGHT TRANSMETATARSAL AMPUTATION;  Surgeon: Newt Minion, MD;  Location: [place];  Service: Orthopedics;  Laterality: Right;  . APPENDECTOMY    . FOOT SURGERY Left   . LAPAROSCOPIC APPENDECTOMY  07/28/2011   Procedure: APPENDECTOMY LAPAROSCOPIC;  Surgeon: Donato Heinz, MD;  Location: [facility] ORS;  Service: General;  Laterality: N/A;  . LEG SURGERY      MEDICATIONS: No current facility-administered medications for this encounter.   Marland Kitchen albuterol (PROVENTIL HFA;VENTOLIN HFA) 108 (90 BASE) MCG/ACT inhaler  . amitriptyline (ELAVIL) 150 MG tablet  . amLODipine (NORVASC) 5 MG tablet  . aspirin EC 81 MG tablet  . diphenhydrAMINE (BENADRYL) 25 MG tablet  . doxycycline (VIBRA-TABS) 100 MG tablet  . gabapentin (NEURONTIN) 600 MG tablet  . HUMULIN N 100 UNIT/ML injection  . losartan (COZAAR) 50 MG tablet  . metFORMIN (GLUCOPHAGE) 1000 MG tablet  . Naphazoline HCl (CLEAR EYES OP)  . NARCAN 4 MG/0.1ML LIQD nasal spray kit  . omeprazole (PRILOSEC) 20 MG capsule  . Oxycodone HCl 10 MG TABS  . pravastatin (PRAVACHOL) 20 MG tablet  . vitamin C (ASCORBIC ACID) 500 MG tablet  . Vitamin D, Ergocalciferol, (DRISDOL) 1.25 MG (50000 UT) CAPS capsule  . nitroGLYCERIN (NITRODUR - DOSED IN MG/24 HR)  0.2 mg/hr patch    Myra Gianotti, PA-C Surgical Short Stay/Anesthesiology [facility] Phone [phone] [facility] Phone [phone] 06/20/2020 2:47 PM

## 2020-06-21 ENCOUNTER — Inpatient Hospital Stay (HOSPITAL_COMMUNITY): Admission: RE | Admit: 2020-06-21 | Payer: Medicaid Other | Source: Home / Self Care | Admitting: Orthopedic Surgery

## 2020-06-21 ENCOUNTER — Encounter (HOSPITAL_COMMUNITY): Admission: RE | Payer: Self-pay | Source: Home / Self Care

## 2020-06-21 SURGERY — AMPUTATION BELOW KNEE
Anesthesia: Choice | Site: Knee | Laterality: Right

## 2020-07-01 ENCOUNTER — Other Ambulatory Visit: Payer: Self-pay | Admitting: Physician Assistant

## 2020-07-02 ENCOUNTER — Other Ambulatory Visit: Payer: Self-pay

## 2020-07-02 ENCOUNTER — Encounter (HOSPITAL_COMMUNITY): Payer: Self-pay | Admitting: Orthopedic Surgery

## 2020-07-02 ENCOUNTER — Other Ambulatory Visit: Payer: Self-pay | Admitting: Physician Assistant

## 2020-07-02 NOTE — Progress Notes (Signed)
Spoke with pt for pre-op call. Pt denies any recent chest pain or sob. Pt is diabetic. He states his last A1C was 9.0 two - three months ago. States his fasting blood sugars are usually in the 190's. Instructed pt to take 1/2 of his regular dose of Humulin N insulin this evening and in the AM. He will take 5 units. Instructed pt to check his blood sugar when he gets up and every 2 hours until he leaves for the hospital. If blood sugar is 70 or below, treat with 1/2 cup of clear juice (apple or cranberry) and recheck blood sugar 15 minutes after drinking juice. If blood sugar continues to be 70 or below, call the Short Stay department and ask to speak to a nurse. Pt voiced understanding.   Pt states he will be arriving here around 7:45 AM due to transportation issues. I did tell him he may have to wait in the waiting area since he doesn't have to be here until 11:45 AM. He stated he that he understood that.

## 2020-07-03 ENCOUNTER — Encounter (HOSPITAL_COMMUNITY)
Admission: RE | Disposition: A | Discharge status: Home Health | Payer: Self-pay | Source: Home / Self Care | Attending: Orthopedic Surgery

## 2020-07-03 ENCOUNTER — Inpatient Hospital Stay (HOSPITAL_COMMUNITY): Payer: Medicaid Other | Admitting: Anesthesiology

## 2020-07-03 ENCOUNTER — Other Ambulatory Visit: Payer: Self-pay

## 2020-07-03 ENCOUNTER — Encounter (HOSPITAL_COMMUNITY): Payer: Self-pay | Admitting: Orthopedic Surgery

## 2020-07-03 ENCOUNTER — Inpatient Hospital Stay (HOSPITAL_COMMUNITY)
Admission: RE | Admit: 2020-07-03 | Discharge: 2020-07-06 | DRG: 617 | Disposition: A | Discharge status: Home Health | Payer: Medicaid Other | Attending: Orthopedic Surgery | Admitting: Orthopedic Surgery

## 2020-07-03 DIAGNOSIS — Z8673 Personal history of transient ischemic attack (TIA), and cerebral infarction without residual deficits: Secondary | ICD-10-CM | POA: Diagnosis not present

## 2020-07-03 DIAGNOSIS — L089 Local infection of the skin and subcutaneous tissue, unspecified: Secondary | ICD-10-CM

## 2020-07-03 DIAGNOSIS — J449 Chronic obstructive pulmonary disease, unspecified: Secondary | ICD-10-CM | POA: Diagnosis present

## 2020-07-03 DIAGNOSIS — Z87891 Personal history of nicotine dependence: Secondary | ICD-10-CM

## 2020-07-03 DIAGNOSIS — Z9049 Acquired absence of other specified parts of digestive tract: Secondary | ICD-10-CM

## 2020-07-03 DIAGNOSIS — E1169 Type 2 diabetes mellitus with other specified complication: Secondary | ICD-10-CM | POA: Diagnosis present

## 2020-07-03 DIAGNOSIS — I10 Essential (primary) hypertension: Secondary | ICD-10-CM | POA: Diagnosis present

## 2020-07-03 DIAGNOSIS — M109 Gout, unspecified: Secondary | ICD-10-CM | POA: Diagnosis present

## 2020-07-03 DIAGNOSIS — M1990 Unspecified osteoarthritis, unspecified site: Secondary | ICD-10-CM | POA: Diagnosis present

## 2020-07-03 DIAGNOSIS — Z20822 Contact with and (suspected) exposure to covid-19: Secondary | ICD-10-CM | POA: Diagnosis present

## 2020-07-03 DIAGNOSIS — I252 Old myocardial infarction: Secondary | ICD-10-CM

## 2020-07-03 DIAGNOSIS — M86271 Subacute osteomyelitis, right ankle and foot: Secondary | ICD-10-CM | POA: Diagnosis present

## 2020-07-03 DIAGNOSIS — M86071 Acute hematogenous osteomyelitis, right ankle and foot: Secondary | ICD-10-CM | POA: Diagnosis present

## 2020-07-03 DIAGNOSIS — E11628 Type 2 diabetes mellitus with other skin complications: Secondary | ICD-10-CM

## 2020-07-03 DIAGNOSIS — F909 Attention-deficit hyperactivity disorder, unspecified type: Secondary | ICD-10-CM | POA: Diagnosis present

## 2020-07-03 DIAGNOSIS — F419 Anxiety disorder, unspecified: Secondary | ICD-10-CM | POA: Diagnosis present

## 2020-07-03 DIAGNOSIS — Z886 Allergy status to analgesic agent status: Secondary | ICD-10-CM

## 2020-07-03 DIAGNOSIS — Z89421 Acquired absence of other right toe(s): Secondary | ICD-10-CM | POA: Diagnosis not present

## 2020-07-03 DIAGNOSIS — Y835 Amputation of limb(s) as the cause of abnormal reaction of the patient, or of later complication, without mention of misadventure at the time of the procedure: Secondary | ICD-10-CM | POA: Diagnosis present

## 2020-07-03 DIAGNOSIS — K219 Gastro-esophageal reflux disease without esophagitis: Secondary | ICD-10-CM | POA: Diagnosis present

## 2020-07-03 DIAGNOSIS — Z89511 Acquired absence of right leg below knee: Secondary | ICD-10-CM | POA: Diagnosis not present

## 2020-07-03 DIAGNOSIS — T8781 Dehiscence of amputation stump: Secondary | ICD-10-CM | POA: Diagnosis present

## 2020-07-03 DIAGNOSIS — Z880 Allergy status to penicillin: Secondary | ICD-10-CM | POA: Diagnosis not present

## 2020-07-03 HISTORY — PX: AMPUTATION: SHX166

## 2020-07-03 HISTORY — PX: BELOW KNEE LEG AMPUTATION: SUR23

## 2020-07-03 LAB — COMPREHENSIVE METABOLIC PANEL
ALT: 8 U/L (ref 0–44)
AST: 20 U/L (ref 15–41)
Albumin: 3.1 g/dL — ABNORMAL LOW (ref 3.5–5.0)
Alkaline Phosphatase: 106 U/L (ref 38–126)
Anion gap: 8 (ref 5–15)
BUN: 7 mg/dL (ref 6–20)
CO2: 27 mmol/L (ref 22–32)
Calcium: 9 mg/dL (ref 8.9–10.3)
Chloride: 99 mmol/L (ref 98–111)
Creatinine, Ser: 0.51 mg/dL — ABNORMAL LOW (ref 0.61–1.24)
GFR, Estimated: >60 mL/min (ref >60)
Glucose, Bld: 147 mg/dL — ABNORMAL HIGH (ref 70–99)
Potassium: 3.8 mmol/L (ref 3.5–5.1)
Sodium: 134 mmol/L — ABNORMAL LOW (ref 135–145)
Total Bilirubin: 0.8 mg/dL (ref 0.3–1.2)
Total Protein: 7.8 g/dL (ref 6.5–8.1)

## 2020-07-03 LAB — CBC
HCT: 32.4 % — ABNORMAL LOW (ref 39.0–52.0)
Hemoglobin: 9.8 g/dL — ABNORMAL LOW (ref 13.0–17.0)
MCH: 25.1 pg — ABNORMAL LOW (ref 26.0–34.0)
MCHC: 30.2 g/dL (ref 30.0–36.0)
MCV: 82.9 fL (ref 80.0–100.0)
Platelets: 244 10*3/uL (ref 150–400)
RBC: 3.91 MIL/uL — ABNORMAL LOW (ref 4.22–5.81)
RDW: 14.8 % (ref 11.5–15.5)
WBC: 6.2 10*3/uL (ref 4.0–10.5)
nRBC: 0 % (ref 0.0–0.2)

## 2020-07-03 LAB — TYPE AND SCREEN
ABO/RH(D): O NEG
Antibody Screen: NEGATIVE

## 2020-07-03 LAB — GLUCOSE, CAPILLARY
Glucose-Capillary: 166 mg/dL — ABNORMAL HIGH (ref 70–99)
Glucose-Capillary: 139 mg/dL — ABNORMAL HIGH (ref 70–99)
Glucose-Capillary: 267 mg/dL — ABNORMAL HIGH (ref 70–99)

## 2020-07-03 LAB — SARS CORONAVIRUS 2 BY RT PCR (HOSPITAL ORDER, PERFORMED IN ~~LOC~~ HOSPITAL LAB): SARS Coronavirus 2: NEGATIVE

## 2020-07-03 SURGERY — AMPUTATION BELOW KNEE
Anesthesia: Monitor Anesthesia Care | Site: Leg Lower | Laterality: Right

## 2020-07-03 MED ORDER — MIDAZOLAM HCL 2 MG/2ML IJ SOLN
INTRAMUSCULAR | Status: AC
  Filled 2020-07-03: qty 2

## 2020-07-03 MED ORDER — ORAL CARE MOUTH RINSE: 15.0000 mL | Freq: Once | OROMUCOSAL | Status: AC

## 2020-07-03 MED ORDER — LIDOCAINE 2% (20 MG/ML) 5 ML SYRINGE
INTRAMUSCULAR | Status: AC
  Filled 2020-07-03: qty 5

## 2020-07-03 MED ORDER — HYDROMORPHONE HCL 1 MG/ML IJ SOLN
0.5000 mg | INTRAMUSCULAR | Status: DC | PRN
  Administered 2020-07-03 – 2020-07-05 (×8): 1 mg via INTRAVENOUS
  Filled 2020-07-03 (×9): qty 1

## 2020-07-03 MED ORDER — OXYCODONE HCL 5 MG/5ML PO SOLN: 5.0000 mg | Freq: Once | ORAL | Status: DC | PRN

## 2020-07-03 MED ORDER — AMLODIPINE BESYLATE 5 MG PO TABS
5.0000 mg | ORAL_TABLET | Freq: Every day | ORAL | Status: DC
  Administered 2020-07-05: 5 mg via ORAL
  Filled 2020-07-03 (×3): qty 1

## 2020-07-03 MED ORDER — SODIUM CHLORIDE 0.9 % IR SOLN
Status: DC | PRN
  Administered 2020-07-03: 1000 mL

## 2020-07-03 MED ORDER — PROPOFOL 500 MG/50ML IV EMUL
INTRAVENOUS | Status: DC | PRN
  Administered 2020-07-03: 75 ug/kg/min via INTRAVENOUS

## 2020-07-03 MED ORDER — OXYCODONE HCL 5 MG PO TABS
10.0000 mg | ORAL_TABLET | ORAL | Status: DC | PRN
  Administered 2020-07-03: 15 mg via ORAL
  Administered 2020-07-03: 10 mg via ORAL
  Administered 2020-07-04 – 2020-07-06 (×5): 15 mg via ORAL
  Filled 2020-07-03 (×6): qty 3

## 2020-07-03 MED ORDER — CLINDAMYCIN PHOSPHATE 900 MG/50ML IV SOLN
900.0000 mg | INTRAVENOUS | Status: AC
  Administered 2020-07-03: 900 mg via INTRAVENOUS
  Filled 2020-07-03: qty 50

## 2020-07-03 MED ORDER — ONDANSETRON HCL 4 MG PO TABS: 4.0000 mg | ORAL_TABLET | Freq: Four times a day (QID) | ORAL | Status: DC | PRN

## 2020-07-03 MED ORDER — OXYCODONE HCL 5 MG PO TABS
5.0000 mg | ORAL_TABLET | ORAL | Status: DC | PRN
  Administered 2020-07-04 – 2020-07-06 (×3): 10 mg via ORAL
  Filled 2020-07-03 (×4): qty 2

## 2020-07-03 MED ORDER — CHLORHEXIDINE GLUCONATE 0.12 % MT SOLN
OROMUCOSAL | Status: AC
  Administered 2020-07-03: 15 mL via OROMUCOSAL
  Filled 2020-07-03: qty 15

## 2020-07-03 MED ORDER — ROCURONIUM BROMIDE 10 MG/ML (PF) SYRINGE
PREFILLED_SYRINGE | INTRAVENOUS | Status: AC
  Filled 2020-07-03: qty 10

## 2020-07-03 MED ORDER — AMITRIPTYLINE HCL 50 MG PO TABS
150.0000 mg | ORAL_TABLET | Freq: Every day | ORAL | Status: DC
  Administered 2020-07-03 – 2020-07-05 (×3): 150 mg via ORAL
  Filled 2020-07-03 (×3): qty 3

## 2020-07-03 MED ORDER — PRAVASTATIN SODIUM 40 MG PO TABS
20.0000 mg | ORAL_TABLET | Freq: Every day | ORAL | Status: DC
  Administered 2020-07-04 – 2020-07-06 (×3): 20 mg via ORAL
  Filled 2020-07-03 (×3): qty 1

## 2020-07-03 MED ORDER — ALBUTEROL SULFATE (2.5 MG/3ML) 0.083% IN NEBU: 2.5000 mg | INHALATION_SOLUTION | RESPIRATORY_TRACT | Status: DC | PRN

## 2020-07-03 MED ORDER — METFORMIN HCL 500 MG PO TABS
1000.0000 mg | ORAL_TABLET | Freq: Two times a day (BID) | ORAL | Status: DC
  Administered 2020-07-04 – 2020-07-06 (×5): 1000 mg via ORAL
  Filled 2020-07-03 (×5): qty 2

## 2020-07-03 MED ORDER — CLINDAMYCIN PHOSPHATE 600 MG/50ML IV SOLN
600.0000 mg | Freq: Four times a day (QID) | INTRAVENOUS | Status: AC
  Administered 2020-07-03 – 2020-07-04 (×3): 600 mg via INTRAVENOUS
  Filled 2020-07-03 (×3): qty 50

## 2020-07-03 MED ORDER — MIDAZOLAM HCL 2 MG/2ML IJ SOLN
INTRAMUSCULAR | Status: AC
  Administered 2020-07-03: 1 mg via INTRAVENOUS
  Filled 2020-07-03: qty 2

## 2020-07-03 MED ORDER — ASPIRIN EC 81 MG PO TBEC
81.0000 mg | DELAYED_RELEASE_TABLET | Freq: Every day | ORAL | Status: DC
  Administered 2020-07-04 – 2020-07-06 (×3): 81 mg via ORAL
  Filled 2020-07-03 (×3): qty 1

## 2020-07-03 MED ORDER — INSULIN ASPART 100 UNIT/ML ~~LOC~~ SOLN
0.0000 [IU] | Freq: Three times a day (TID) | SUBCUTANEOUS | Status: DC
  Administered 2020-07-04: 2 [IU] via SUBCUTANEOUS
  Administered 2020-07-04: 8 [IU] via SUBCUTANEOUS
  Administered 2020-07-04: 3 [IU] via SUBCUTANEOUS
  Administered 2020-07-05 (×2): 2 [IU] via SUBCUTANEOUS
  Administered 2020-07-05 – 2020-07-06 (×2): 5 [IU] via SUBCUTANEOUS

## 2020-07-03 MED ORDER — ONDANSETRON HCL 4 MG/2ML IJ SOLN: 4.0000 mg | Freq: Four times a day (QID) | INTRAMUSCULAR | Status: DC | PRN

## 2020-07-03 MED ORDER — PANTOPRAZOLE SODIUM 40 MG PO TBEC
40.0000 mg | DELAYED_RELEASE_TABLET | Freq: Every day | ORAL | Status: DC
  Administered 2020-07-04 – 2020-07-06 (×3): 40 mg via ORAL
  Filled 2020-07-03 (×3): qty 1

## 2020-07-03 MED ORDER — METOCLOPRAMIDE HCL 5 MG/ML IJ SOLN
5.0000 mg | Freq: Three times a day (TID) | INTRAMUSCULAR | Status: DC | PRN
Start: 2020-07-03 — End: 2020-07-06

## 2020-07-03 MED ORDER — FENTANYL CITRATE (PF) 100 MCG/2ML IJ SOLN: 25.0000 ug | INTRAMUSCULAR | Status: DC | PRN

## 2020-07-03 MED ORDER — PROMETHAZINE HCL 25 MG/ML IJ SOLN: 6.2500 mg | INTRAMUSCULAR | Status: DC | PRN

## 2020-07-03 MED ORDER — AMISULPRIDE (ANTIEMETIC) 5 MG/2ML IV SOLN: 10.0000 mg | Freq: Once | INTRAVENOUS | Status: DC | PRN

## 2020-07-03 MED ORDER — SODIUM CHLORIDE 0.9 % IV SOLN: INTRAVENOUS | Status: DC

## 2020-07-03 MED ORDER — FENTANYL CITRATE (PF) 100 MCG/2ML IJ SOLN: 50.0000 ug | Freq: Once | INTRAMUSCULAR | Status: AC

## 2020-07-03 MED ORDER — PROPOFOL 10 MG/ML IV BOLUS
INTRAVENOUS | Status: DC | PRN
  Administered 2020-07-03: 30 mg via INTRAVENOUS
  Administered 2020-07-03: 20 mg via INTRAVENOUS

## 2020-07-03 MED ORDER — PROPOFOL 1000 MG/100ML IV EMUL
INTRAVENOUS | Status: AC
  Filled 2020-07-03: qty 100

## 2020-07-03 MED ORDER — OXYCODONE HCL 5 MG PO TABS
5.0000 mg | ORAL_TABLET | Freq: Once | ORAL | Status: DC | PRN
Start: 2020-07-03 — End: 2020-07-03

## 2020-07-03 MED ORDER — METOCLOPRAMIDE HCL 5 MG PO TABS
5.0000 mg | ORAL_TABLET | Freq: Three times a day (TID) | ORAL | Status: DC | PRN
Start: 2020-07-03 — End: 2020-07-06

## 2020-07-03 MED ORDER — LOSARTAN POTASSIUM 50 MG PO TABS
50.0000 mg | ORAL_TABLET | Freq: Every day | ORAL | Status: DC
  Administered 2020-07-05: 50 mg via ORAL
  Filled 2020-07-03 (×3): qty 1

## 2020-07-03 MED ORDER — LACTATED RINGERS IV SOLN: INTRAVENOUS | Status: DC

## 2020-07-03 MED ORDER — DOCUSATE SODIUM 100 MG PO CAPS
100.0000 mg | ORAL_CAPSULE | Freq: Two times a day (BID) | ORAL | Status: DC
  Administered 2020-07-03 – 2020-07-05 (×5): 100 mg via ORAL
  Filled 2020-07-03 (×5): qty 1

## 2020-07-03 MED ORDER — ACETAMINOPHEN 325 MG PO TABS
325.0000 mg | ORAL_TABLET | Freq: Four times a day (QID) | ORAL | Status: DC | PRN
  Administered 2020-07-03: 650 mg via ORAL
  Filled 2020-07-03 (×2): qty 2

## 2020-07-03 MED ORDER — METHOCARBAMOL 500 MG PO TABS
500.0000 mg | ORAL_TABLET | Freq: Four times a day (QID) | ORAL | Status: DC | PRN
  Administered 2020-07-04 (×3): 500 mg via ORAL
  Filled 2020-07-03 (×3): qty 1

## 2020-07-03 MED ORDER — KETOROLAC TROMETHAMINE 30 MG/ML IJ SOLN
INTRAMUSCULAR | Status: AC
  Filled 2020-07-03: qty 1

## 2020-07-03 MED ORDER — LIDOCAINE 2% (20 MG/ML) 5 ML SYRINGE
INTRAMUSCULAR | Status: DC | PRN
  Administered 2020-07-03: 60 mg via INTRAVENOUS

## 2020-07-03 MED ORDER — FENTANYL CITRATE (PF) 100 MCG/2ML IJ SOLN
INTRAMUSCULAR | Status: AC
  Administered 2020-07-03: 50 ug via INTRAVENOUS
  Filled 2020-07-03: qty 2

## 2020-07-03 MED ORDER — MIDAZOLAM HCL 5 MG/5ML IJ SOLN
INTRAMUSCULAR | Status: DC | PRN
  Administered 2020-07-03 (×2): 1 mg via INTRAVENOUS

## 2020-07-03 MED ORDER — ACETAMINOPHEN 500 MG PO TABS
1000.0000 mg | ORAL_TABLET | Freq: Once | ORAL | Status: AC
  Administered 2020-07-03: 1000 mg via ORAL
  Filled 2020-07-03: qty 2

## 2020-07-03 MED ORDER — MIDAZOLAM HCL 2 MG/2ML IJ SOLN: 1.0000 mg | Freq: Once | INTRAMUSCULAR | Status: AC

## 2020-07-03 MED ORDER — ONDANSETRON HCL 4 MG/2ML IJ SOLN
INTRAMUSCULAR | Status: DC | PRN
  Administered 2020-07-03: 4 mg via INTRAVENOUS

## 2020-07-03 MED ORDER — METHOCARBAMOL 1000 MG/10ML IJ SOLN
500.0000 mg | Freq: Four times a day (QID) | INTRAVENOUS | Status: DC | PRN
  Filled 2020-07-03: qty 5

## 2020-07-03 MED ORDER — ROPIVACAINE HCL 5 MG/ML IJ SOLN
INTRAMUSCULAR | Status: DC | PRN
  Administered 2020-07-03: 45 mL via PERINEURAL

## 2020-07-03 MED ORDER — CHLORHEXIDINE GLUCONATE 0.12 % MT SOLN: 15.0000 mL | Freq: Once | OROMUCOSAL | Status: AC

## 2020-07-03 MED ORDER — PROPOFOL 10 MG/ML IV BOLUS
INTRAVENOUS | Status: AC
  Filled 2020-07-03: qty 20

## 2020-07-03 MED ORDER — GABAPENTIN 300 MG PO CAPS
1200.0000 mg | ORAL_CAPSULE | Freq: Three times a day (TID) | ORAL | Status: DC
  Administered 2020-07-03 – 2020-07-06 (×9): 1200 mg via ORAL
  Filled 2020-07-03 (×9): qty 4

## 2020-07-03 SURGICAL SUPPLY — 37 items
BLADE SAW RECIP 87.9 MT (BLADE) ×2 IMPLANT
BLADE SURG 21 STRL SS (BLADE) ×2 IMPLANT
BNDG COHESIVE 6X5 TAN STRL LF (GAUZE/BANDAGES/DRESSINGS) ×2 IMPLANT
CANISTER WOUND CARE 500ML ATS (WOUND CARE) ×2 IMPLANT
COVER SURGICAL LIGHT HANDLE (MISCELLANEOUS) ×2 IMPLANT
CUFF TOURN SGL QUICK 34 (TOURNIQUET CUFF) ×2
CUFF TRNQT CYL 34X4.125X (TOURNIQUET CUFF) ×1 IMPLANT
DRAPE DERMATAC (DRAPES) ×4 IMPLANT
DRAPE INCISE IOBAN 66X45 STRL (DRAPES) ×2 IMPLANT
DRAPE U-SHAPE 47X51 STRL (DRAPES) ×2 IMPLANT
DRESSING PREVENA PLUS CUSTOM (GAUZE/BANDAGES/DRESSINGS) ×1 IMPLANT
DRSG PREVENA PLUS CUSTOM (GAUZE/BANDAGES/DRESSINGS) ×2
DURAPREP 26ML APPLICATOR (WOUND CARE) ×2 IMPLANT
ELECT REM PT RETURN 9FT ADLT (ELECTROSURGICAL) ×2
ELECTRODE REM PT RTRN 9FT ADLT (ELECTROSURGICAL) ×1 IMPLANT
GLOVE BIOGEL PI IND STRL 9 (GLOVE) ×1 IMPLANT
GLOVE BIOGEL PI INDICATOR 9 (GLOVE) ×1
GLOVE SURG ORTHO 9.0 STRL STRW (GLOVE) ×2 IMPLANT
GOWN STRL REUS W/ TWL XL LVL3 (GOWN DISPOSABLE) ×2 IMPLANT
GOWN STRL REUS W/TWL XL LVL3 (GOWN DISPOSABLE) ×4
KIT BASIN OR (CUSTOM PROCEDURE TRAY) ×2 IMPLANT
KIT TURNOVER KIT B (KITS) ×2 IMPLANT
MANIFOLD NEPTUNE II (INSTRUMENTS) ×2 IMPLANT
NS IRRIG 1000ML POUR BTL (IV SOLUTION) ×2 IMPLANT
PACK ORTHO EXTREMITY (CUSTOM PROCEDURE TRAY) ×2 IMPLANT
PAD ARMBOARD 7.5X6 YLW CONV (MISCELLANEOUS) ×4 IMPLANT
PREVENA RESTOR ARTHOFORM 46X30 (CANNISTER) ×2 IMPLANT
PREVENA RESTOR AXIOFORM 29X28 (GAUZE/BANDAGES/DRESSINGS) ×2 IMPLANT
SPONGE LAP 18X18 RF (DISPOSABLE) ×4 IMPLANT
STAPLER VISISTAT 35W (STAPLE) ×2 IMPLANT
STOCKINETTE IMPERVIOUS LG (DRAPES) ×2 IMPLANT
SUT SILK 2 0 (SUTURE) ×1
SUT SILK 2-0 18XBRD TIE 12 (SUTURE) ×1 IMPLANT
SUT VIC AB 1 CTX 27 (SUTURE) ×4 IMPLANT
TOWEL GREEN STERILE (TOWEL DISPOSABLE) ×2 IMPLANT
TUBE CONNECTING 12X1/4 (SUCTIONS) ×2 IMPLANT
YANKAUER SUCT BULB TIP NO VENT (SUCTIONS) ×2 IMPLANT

## 2020-07-03 NOTE — Plan of Care (Signed)

## 2020-07-03 NOTE — Progress Notes (Signed)
Orthopedic Tech Progress Note Patient Details:  [name] 1965/12/15 [id number] Called in order to [facility] for a VIVE PROTOCOL BK Patient ID: [name], male   DOB: 1965/06/08, 55 y.o.   MRN: [id number]   Donald Pore 07/03/2020, 6:31 PM

## 2020-07-03 NOTE — Anesthesia Procedure Notes (Signed)
Procedure Name: MAC Date/Time: 07/03/2020 3:03 PM Performed by: Rande Brunt, CRNA Pre-anesthesia Checklist: Patient identified, Emergency Drugs available, Suction available and Patient being monitored Patient Re-evaluated:Patient Re-evaluated prior to induction Oxygen Delivery Method: Simple face mask Induction Type: IV induction Placement Confirmation: positive ETCO2 Dental Injury: Teeth and Oropharynx as per pre-operative assessment

## 2020-07-03 NOTE — Interval H&P Note (Signed)
History and Physical Interval Note:  07/03/2020 12:23 PM  [name]  has presented today for surgery, with the diagnosis of Dehiscence Right Transmetatarsal Amputation.  The various methods of treatment have been discussed with the patient and family. After consideration of risks, benefits and other options for treatment, the patient has consented to  Procedure(s): RIGHT BELOW KNEE AMPUTATION (Right) as a surgical intervention.  The patient's history has been reviewed, patient examined, no change in status, stable for surgery.  I have reviewed the patient's chart and labs.  Questions were answered to the patient's satisfaction.     Nadara Mustard

## 2020-07-03 NOTE — Progress Notes (Signed)
1730 Received pt from PACU, A&O x4. Right BKA with wound vac dressing dry and intact, vac is on and plugged in. Denies pain at this time.

## 2020-07-03 NOTE — Anesthesia Preprocedure Evaluation (Addendum)
Anesthesia Evaluation  Patient identified by MRN, date of birth, ID band Patient awake    Reviewed: Allergy & Precautions, H&P , NPO status , Patient's Chart, lab work & pertinent test results  Airway Mallampati: II   Neck ROM: full    Dental   Pulmonary asthma , COPD, former smoker,    breath sounds clear to auscultation       Cardiovascular hypertension,  Rhythm:regular Rate:Normal     Neuro/Psych PSYCHIATRIC DISORDERS Anxiety Depression CVA    GI/Hepatic GERD  ,  Endo/Other  diabetes, Type 2Morbid obesity  Renal/GU      Musculoskeletal  (+) Arthritis ,   Abdominal   Peds  Hematology  (+) Blood dyscrasia, anemia ,   Anesthesia Other Findings   Reproductive/Obstetrics                            Anesthesia Physical  Anesthesia Plan  ASA: III  Anesthesia Plan: MAC and Regional   Post-op Pain Management:  Regional for Post-op pain   Induction: Intravenous  PONV Risk Score and Plan: 1 and Ondansetron, Propofol infusion, Treatment may vary due to age or medical condition and Midazolam  Airway Management Planned: Simple Face Mask  Additional Equipment: None  Intra-op Plan:   Post-operative Plan:   Informed Consent: I have reviewed the patients History and Physical, chart, labs and discussed the procedure including the risks, benefits and alternatives for the proposed anesthesia with the patient or authorized representative who has indicated his/her understanding and acceptance.     Dental advisory given  Plan Discussed with: CRNA and Anesthesiologist  Anesthesia Plan Comments: (Block/sedation. GA/LMA as backup plan.)       Anesthesia Quick Evaluation

## 2020-07-03 NOTE — Anesthesia Procedure Notes (Signed)
Anesthesia Regional Block: Popliteal block   Pre-Anesthetic Checklist: ,, timeout performed, Correct Patient, Correct Site, Correct Laterality, Correct Procedure, Correct Position, site marked, Risks and benefits discussed,  Surgical consent,  Pre-op evaluation,  At surgeon's request and post-op pain management  Laterality: Right  Prep: chloraprep       Needles:  Injection technique: Single-shot  Needle Type: Echogenic Stimulator Needle     Needle Length: 10cm  Needle Gauge: 20     Additional Needles:   Procedures:,,,, ultrasound used (permanent image in chart),,,,  Narrative:  Start time: 07/03/2020 1:35 PM End time: 07/03/2020 1:45 PM Injection made incrementally with aspirations every 5 mL.  Performed by: Personally  Anesthesiologist: Mellody Dance, MD  Additional Notes: A functioning IV was confirmed and monitors were applied.  Sterile prep and drape, hand hygiene and sterile gloves were used.  Negative aspiration and test dose prior to incremental administration of local anesthetic. The patient tolerated the procedure well.Ultrasound  guidance: relevant anatomy identified, needle position confirmed, local anesthetic spread visualized around nerve(s), vascular puncture avoided.  Image printed for medical record.

## 2020-07-03 NOTE — Anesthesia Postprocedure Evaluation (Signed)
Anesthesia Post Note  Patient: [name]  Procedure(s) Performed: RIGHT BELOW KNEE AMPUTATION (Right Leg Lower)     Patient location during evaluation: PACU Anesthesia Type: Regional and MAC Level of consciousness: awake and alert Pain management: pain level controlled Vital Signs Assessment: post-procedure vital signs reviewed and stable Respiratory status: spontaneous breathing and respiratory function stable Cardiovascular status: stable Postop Assessment: no apparent nausea or vomiting Anesthetic complications: no   No complications documented.  Last Vitals:  Vitals:   07/03/20 1640 07/03/20 1655  BP: 117/70 115/80  Pulse: 77 79  Resp: 13 15  Temp:  36.6 C  SpO2: 94% 94%    Last Pain:  Vitals:   07/03/20 1640  TempSrc:   PainSc: Asleep                 Sirena Riddle DANIEL

## 2020-07-03 NOTE — Op Note (Signed)
   Date of Surgery: 07/03/2020  INDICATIONS: [name] is a 55 y.o.-year-old male who has undergone limb salvage intervention on the right with ulceration abscess and osteomyelitis of the foot.  He has failed foot salvage intervention presents at this time for transtibial amputation.Marland Kitchen  PREOPERATIVE DIAGNOSIS: Abscess ulceration osteomyelitis right foot  POSTOPERATIVE DIAGNOSIS: Same.  PROCEDURE: Transtibial amputation Application of Prevena wound VAC  SURGEON: Lajoyce Corners, M.D.  ANESTHESIA:  general  IV FLUIDS AND URINE: See anesthesia records.  ESTIMATED BLOOD LOSS: See anesthesia records.  COMPLICATIONS: None.  DESCRIPTION OF PROCEDURE: The patient was brought to the operating room after undergoing regional anesthetic. After adequate levels of anesthesia were obtained patient's lower extremity was prepped using DuraPrep draped into a sterile field. A timeout was called. The foot was draped out of the sterile field with impervious stockinette. A transverse incision was made 11 cm distal to the tibial tubercle. This curved proximally and a large posterior flap was created. The tibia was transected 1 cm proximal to the skin incision. The fibula was transected just proximal to the tibial incision. The tibia was beveled anteriorly. A large posterior flap was created. The sciatic nerve was pulled cut and allowed to retract. The vascular bundles were suture ligated with 2-0 silk. The deep and superficial fascial layers were closed using #1 Vicryl. The skin was closed using staples and 2-0 nylon. The wound was covered with a Prevena customizable and arthroform wound VAC.  The dressing was sealed with dermatac there was a good suction fit. A prosthetic shrinker will be applied in patient's room. Patient was taken to the PACU in stable condition.   DISCHARGE PLANNING:  Antibiotic duration: 24 hours  Weightbearing: Nonweightbearing on the operative extremity  Pain medication: Opioid  pathway  Dressing care/ Wound VAC: Continue wound VAC for 1 week after discharge  Discharge to: Discharge planning based on therapy's recommendations for possible inpatient rehabilitation, outpatient rehabilitation, or discharge to home with therapy  Follow-up: In the office 1 week post operative.  Aldean Baker, MD [facility] Orthopedics 3:34 PM

## 2020-07-03 NOTE — H&P (Signed)
[name] is an 55 y.o. male.   Chief Complaint: Right Foot Osteomyelitis HPI: Patient is a 55 year old gentleman who presents in follow-up for right transmetatarsal amputation patient has had progressive wound dehiscence.  Patient states that he had to stop using a nitroglycerin patch due to developing a rash on the dorsum of his foot.  Patient is status post a left transtibial amputation prosthesis with [facility].  Past Medical History:  Diagnosis Date  . Abscess of right foot   . ADHD   . Anemia   . Anxiety   . Arthritis   . Asthma   . COPD (chronic obstructive pulmonary disease) (HCC)   . Diabetes mellitus   . Diabetic foot ulcer (HCC) 07/09/2017  . 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 Foot osteomyelitis, left (HCC)   . GERD (gastroesophageal reflux disease)   . Gout   . History of blood transfusion   . Hypertension   . Myocardial infarction [facility])    ? 2008,  states he did not have a cath done and never saw a cardiologist  . Neuropathy   . Pancreatitis   . Stroke [facility])    TIA- no residual  . Wears glasses     Past Surgical History:  Procedure Laterality Date  . AMPUTATION Left 11/09/2018   Procedure: LEFT TRANSMETATARSAL AMPUTATION;  Surgeon: Nadara Mustard, MD;  Location: [facility] OR;  Service: Orthopedics;  Laterality: Left;  . AMPUTATION Left 12/13/2019   Procedure: LEFT BELOW KNEE AMPUTATION;  Surgeon: Nadara Mustard, MD;  Location: [facility] OR;  Service: Orthopedics;  Laterality: Left;  . AMPUTATION Right 05/15/2020   Procedure: RIGHT TRANSMETATARSAL AMPUTATION;  Surgeon: Nadara Mustard, MD;  Location: [facility] OR;  Service: Orthopedics;  Laterality: Right;  . APPENDECTOMY    . FOOT SURGERY Left   . LAPAROSCOPIC APPENDECTOMY  07/28/2011   Procedure: APPENDECTOMY LAPAROSCOPIC;  Surgeon: Fabio Bering, MD;  Location: [facility] ORS;  Service: General;  Laterality: N/A;  . LEG SURGERY      No family history on file. Social  History:  reports that he has quit smoking. His smoking use included cigarettes. He has never used smokeless tobacco. He reports current alcohol use. He reports previous drug use. Drug: Marijuana.  Allergies:  Allergies  Allergen Reactions  . Aspirin Other (See Comments)    Patient says he's a free bleeder, tolerates low dose aspirin  . Penicillins Hives and Other (See Comments)    Has patient had a PCN reaction causing immediate rash, facial/tongue/throat swelling, SOB or lightheadedness with hypotension: YES Has patient had a PCN reaction causing severe rash involving mucus membranes or skin necrosis: NO Has patient had a PCN reaction that required hospitalization: NO Has patient had a PCN reaction occurring within the last 10 years: NO If all of the above answers are "NO", then may proceed with Cephalosporin use.    No medications prior to admission.    No results found for this or any previous visit (from the past 48 hour(s)). No results found.  Review of Systems  All other systems reviewed and are negative.   There were no vitals taken for this visit. Physical Exam  Patient is alert, oriented, no adenopathy, well-dressed, normal affect, normal respiratory effort. Examination patient has good hair growth on the right leg.  He has a strong palpable dorsalis pedis and posterior tibial pulse on the right.  Examination the wound he has  progressive wound dehiscence over the medial column with swelling exposed bone and necrotic tissue across the transmetatarsal amputation.  There is no ascending cellulitis no purulent drainage.Heart RRR Lungs Clear Assessment/Plan 1. Dehiscence of amputation stump (HCC)     Plan: With the progressive wound dehiscence exposed bone and pain discussed that the best option would be to proceed with a transtibial amputation on the right will have him follow-up with [facility] for prosthetic fitting.  Patient states he would like to proceed with surgery  .   [place] Alletta Mattos, PA 07/03/2020, 6:29 AM

## 2020-07-03 NOTE — Transfer of Care (Signed)
Immediate Anesthesia Transfer of Care Note  Patient: [name]  Procedure(s) Performed: RIGHT BELOW KNEE AMPUTATION (Right Leg Lower)  Patient Location: PACU  Anesthesia Type:MAC combined with regional for post-op pain  Level of Consciousness: awake, alert  and oriented  Airway & Oxygen Therapy: Patient Spontanous Breathing  Post-op Assessment: Report given to RN, Post -op Vital signs reviewed and stable and Patient moving all extremities  Post vital signs: Reviewed and stable  Last Vitals:  Vitals Value Taken Time  BP 113/79 07/03/20 1538  Temp    Pulse 103 07/03/20 1539  Resp 17 07/03/20 1539  SpO2 96 % 07/03/20 1539  Vitals shown include unvalidated device data.  Last Pain:  Vitals:   07/03/20 1400  TempSrc:   PainSc: 9       Patients Stated Pain Goal: 3 (09/98/33 8250)  Complications: No complications documented.

## 2020-07-03 NOTE — Anesthesia Procedure Notes (Signed)
Anesthesia Regional Block: Adductor canal block   Pre-Anesthetic Checklist: ,, timeout performed, Correct Patient, Correct Site, Correct Laterality, Correct Procedure, Correct Position, site marked, Risks and benefits discussed,  Surgical consent,  Pre-op evaluation,  At surgeon's request and post-op pain management  Laterality: Right  Prep: chloraprep       Needles:  Injection technique: Single-shot  Needle Type: Echogenic Stimulator Needle     Needle Length: 10cm  Needle Gauge: 20     Additional Needles:   Procedures:,,,, ultrasound used (permanent image in chart),,,,  Narrative:  Start time: 07/03/2020 1:35 PM End time: 07/03/2020 1:45 PM Injection made incrementally with aspirations every 5 mL.  Performed by: Personally  Anesthesiologist: Mellody Dance, MD  Additional Notes: A functioning IV was confirmed and monitors were applied.  Sterile prep and drape, hand hygiene and sterile gloves were used.  Negative aspiration and test dose prior to incremental administration of local anesthetic. The patient tolerated the procedure well.Ultrasound  guidance: relevant anatomy identified, needle position confirmed, local anesthetic spread visualized around nerve(s), vascular puncture avoided.  Image printed for medical record.

## 2020-07-04 ENCOUNTER — Encounter (HOSPITAL_COMMUNITY): Payer: Self-pay | Admitting: Orthopedic Surgery

## 2020-07-04 LAB — MRSA PCR SCREENING: MRSA by PCR: POSITIVE — AB

## 2020-07-04 LAB — GLUCOSE, CAPILLARY
Glucose-Capillary: 279 mg/dL — ABNORMAL HIGH (ref 70–99)
Glucose-Capillary: 161 mg/dL — ABNORMAL HIGH (ref 70–99)
Glucose-Capillary: 145 mg/dL — ABNORMAL HIGH (ref 70–99)

## 2020-07-04 MED ORDER — CHLORHEXIDINE GLUCONATE CLOTH 2 % EX PADS: 6.0000 | MEDICATED_PAD | Freq: Every day | CUTANEOUS | Status: DC

## 2020-07-04 MED ORDER — MUPIROCIN 2 % EX OINT
1.0000 "application " | TOPICAL_OINTMENT | Freq: Two times a day (BID) | CUTANEOUS | Status: DC
  Administered 2020-07-04 – 2020-07-05 (×3): 1 via NASAL

## 2020-07-04 MED ORDER — MUPIROCIN 2 % EX OINT
TOPICAL_OINTMENT | CUTANEOUS | Status: AC
  Filled 2020-07-04: qty 22

## 2020-07-04 NOTE — Plan of Care (Signed)
  Problem: Education: Goal: Knowledge of General Education information will improve Description: Including pain rating scale, medication(s)/side effects and non-pharmacologic comfort measures Outcome: Progressing   Problem: Clinical Measurements: Goal: Diagnostic test results will improve Outcome: Progressing   Problem: Nutrition: Goal: Adequate nutrition will be maintained Outcome: Progressing   

## 2020-07-04 NOTE — Progress Notes (Signed)
Patient is postop day 1 status post right below-knee amputation.  He is awake and lying in bed.  Comfortable.  Wound VAC is in place does not have any green checks but is functioning.  Discussed with patient disposition.  He does not want to have any home physical therapy but just home health to check in on him.  Also very concerned about his ride home and wants to use a specific transportation company.  He does not want skilled nursing.  Spoke with patient that we will see how physical therapy goes today.  We will plan discharge to home in next 1 to 2 days

## 2020-07-04 NOTE — Evaluation (Signed)
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  He has PMH of arthritis, DM, osteomyelitis, GERD, gout, HTN, MI, neuropathy, CVA, L BKA.  Clinical Impression  Pt admitted with above diagnosis. Pt is s/p R BKA.  He has history of L BKA so is familiar with importance of rehab, exercises, and ROM in order to progress mobility and obtain prosthetic in future.  Pt with good sitting balance and upper body strength.  He was able to easily perform anterior posterior transfers.  Unable to do lateral scoot due to no drop arm recliner and unable to stand pivot because could not don prosthesis.  Pt expected to progress well and will benefit from acute PT to advance independence, transfers, and R BKA ROM/Strength. Pt has support at home with DME and ramp.  Would benefit from HHPT (He is agreeable at this time) Pt currently with functional limitations due to the deficits listed below (see PT Problem List). Pt will benefit from skilled PT to increase their independence and safety with mobility to allow discharge to the venue listed below.       Follow Up Recommendations Home health PT;Supervision - Intermittent    Equipment Recommendations  None recommended by PT    Recommendations for Other Services       Precautions / Restrictions Precautions Precautions: Fall Required Braces or Orthoses: Other Brace Other Brace: R limb protector Restrictions Weight Bearing Restrictions: Yes RLE Weight Bearing: Non weight bearing      Mobility  Bed Mobility Overal bed mobility: Independent                  Transfers Overall transfer level: Needs assistance Equipment used: None Transfers: Licensed conveyancer transfers: Supervision   General  transfer comment: Pt starting to attempt stand pivot to L but unable to get L prosthesis in place - reports feels like leg swollen.  Unable to perform lateral scoot as recliner does not have drop arm.  Pt was able to perform anterior/posterior scoot to w/c with supervision for lines.  Ambulation/Gait             General Gait Details: unable - bil BKA, unable to don prosthesis  Stairs            Wheelchair Mobility    Modified Rankin (Stroke Patients Only)       Balance Overall balance assessment: Needs assistance Sitting-balance support: No upper extremity supported Sitting balance-Leahy Scale: Good         Standing balance comment: unable                             Pertinent Vitals/Pain Pain Assessment: 0-10 Pain Score: 9  Pain Location: R residual limb Pain Descriptors / Indicators: Burning Pain Intervention(s): Limited activity within patient's tolerance;Premedicated before session;Monitored during session    Home Living Family/patient expects to be discharged to:: Private residence Living Arrangements: Spouse/significant other Available Help at Discharge: Family;Available 24 hours/day Type of Home: Mobile home Home Access: Ramped entrance     Home Layout: One level Home Equipment: Walker - 2 wheels;Cane - single point;Bedside commode;Shower seat;Grab bars - tub/shower;Hand held shower head;Wheelchair - manual Additional Comments: L prosthetic    Prior Function  Level of Independence: Needs assistance   Gait / Transfers Assistance Needed: Pt was walking with L prosthetic but having pain in R foot so was using w/c most of the time recently.  If he did walk did not use AD.  ADL's / Homemaking Assistance Needed: Pt was independent with ADLs , had assist with IADLs - could assist some with IADLs        Hand Dominance        Extremity/Trunk Assessment   Upper Extremity Assessment Upper Extremity Assessment: Overall WFL for tasks  assessed    Lower Extremity Assessment Lower Extremity Assessment: LLE deficits/detail;RLE deficits/detail RLE Deficits / Details: ROM : hip WFL, knee lacking ~10 degree ext, 90 flexion; MMT: 3/5 not further tested due to pain LLE Deficits / Details: ROM WFL; MMT 5/5    Cervical / Trunk Assessment Cervical / Trunk Assessment: Normal  Communication   Communication: No difficulties  Cognition Arousal/Alertness: Awake/alert Behavior During Therapy: Impulsive Overall Cognitive Status: Within Functional Limits for tasks assessed                                        General Comments General comments (skin integrity, edema, etc.): Cues for safety with lines.  Educated on importance of obtaining full knee ext and resting with leg straight in order to get prosthesis in future.  Pt is familiar with this due to L BKA and is motivated.    Exercises Amputee Exercises Quad Sets: AROM;Right;10 reps;Seated Hip ABduction/ADduction: AROM;Right;5 reps;Seated Knee Flexion: AROM;Right;10 reps;Seated Knee Extension: AROM;Right;10 reps;Seated Straight Leg Raises: AROM;Right;5 reps;Seated   Assessment/Plan    PT Assessment Patient needs continued PT services  PT Problem List Decreased strength;Decreased mobility;Decreased safety awareness;Decreased range of motion;Decreased knowledge of precautions;Decreased activity tolerance;Decreased knowledge of use of DME;Decreased balance;Pain       PT Treatment Interventions DME instruction;Therapeutic activities;Modalities;Gait training;Therapeutic exercise;Patient/family education;Balance training;Functional mobility training;Wheelchair mobility training    PT Goals (Current goals can be found in the Care Plan section)  Acute Rehab PT Goals Patient Stated Goal: return home by Saturday; get prosthetic PT Goal Formulation: With patient Time For Goal Achievement: 07/18/20 Potential to Achieve Goals: Good Additional Goals Additional Goal  #1: Will perform lateral scoot independently    Frequency Min 4X/week   Barriers to discharge        Co-evaluation               AM-PAC PT "6 Clicks" Mobility  Outcome Measure Help needed turning from your back to your side while in a flat bed without using bedrails?: None Help needed moving from lying on your back to sitting on the side of a flat bed without using bedrails?: None Help needed moving to and from a bed to a chair (including a wheelchair)?: A Little Help needed standing up from a chair using your arms (e.g., wheelchair or bedside chair)?: Total Help needed to walk in hospital room?: Total Help needed climbing 3-5 steps with a railing? : Total 6 Click Score: 14    End of Session   Activity Tolerance: Patient tolerated treatment well Patient left: with chair alarm set;in chair;with call bell/phone within reach (green light on chair alarm) Nurse Communication: Mobility status;Patient requests pain meds PT Visit Diagnosis: Other abnormalities of gait and mobility (R26.89);Muscle weakness (generalized) (M62.81);Pain Pain - Right/Left: Right Pain - part of body: Knee    Time: 1443-1540 PT Time  Calculation (min) (ACUTE ONLY): 28 min   Charges:   PT Evaluation $PT Eval Moderate Complexity: 1 Mod PT Treatments $Therapeutic Exercise: 8-22 mins        Anise Salvo, PT Acute Rehab Services Pager [phone] [facility] [phone]    Rayetta Humphrey 07/04/2020, 1:55 PM

## 2020-07-04 NOTE — Evaluation (Signed)
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  He has PMH of arthritis, DM, osteomyelitis, GERD, gout, HTN, MI, neuropathy, CVA, L BKA.   Clinical Impression   Pt presents with decline in function and safety with ADLs and ADL mobility with impaired balance and endurance; pt limiteddby R residual limb pain this session and declines AP transfers. Pt in recliner from being up with PT earlier. Pt is s/p R BKA with hx  L BKA. Pt agreeable to ADL tasks seated in recliner. Pt would benefit from acute OT services to address impairments to maximize level of function and safety    Follow Up Recommendations  Home health OT;Supervision - Intermittent    Equipment Recommendations  None recommended by OT    Recommendations for Other Services       Precautions / Restrictions Precautions Precautions: Fall Required Braces or Orthoses: Other Brace Other Brace: R limb protector Restrictions Weight Bearing Restrictions: Yes RLE Weight Bearing: Non weight bearing      Mobility Bed Mobility Overal bed mobility: Independent                  Transfers Overall transfer level: Needs assistance Equipment used: None Transfers: Licensed conveyancer transfers: Supervision   General transfer comment: pt declined due to pain, Per PT pt AP transferred to recliner from bed with Sup    Balance Overall balance assessment: Needs assistance Sitting-balance support: No upper extremity supported Sitting balance-Leahy Scale: Good         Standing balance comment: unable                           ADL either performed or assessed with clinical judgement   ADL Overall ADL's : Needs assistance/impaired Eating/Feeding:  Independent;Sitting   Grooming: Wash/dry hands;Wash/dry face;Set up;Supervision/safety;Sitting   Upper Body Bathing: Set up;Supervision/ safety;Sitting   Lower Body Bathing: Minimal assistance;Sitting/lateral leans   Upper Body Dressing : Set up;Supervision/safety;Sitting   Lower Body Dressing: Minimal assistance;Sitting/lateral leans                 General ADL Comments: pt declined transfers this session     Vision Patient Visual Report: No change from baseline       Perception     Praxis      Pertinent Vitals/Pain Pain Assessment: 0-10 Pain Score: 7  Pain Location: R residual limb Pain Descriptors / Indicators: Burning;Grimacing;Guarding Pain Intervention(s): Limited activity within patient's tolerance;Premedicated before session;Monitored during session;Repositioned     Hand Dominance Right   Extremity/Trunk Assessment Upper Extremity Assessment Upper Extremity Assessment: Overall WFL for tasks assessed   Lower Extremity Assessment Lower Extremity Assessment: Defer to PT evaluation RLE Deficits / Details: ROM : hip WFL, knee lacking ~10 degree ext, 90 flexion; MMT: 3/5 not further tested due to pain LLE Deficits / Details: ROM WFL; MMT 5/5   Cervical / Trunk Assessment Cervical / Trunk Assessment: Normal   Communication Communication Communication: No difficulties   Cognition Arousal/Alertness: Awake/alert Behavior During Therapy: Impulsive Overall Cognitive Status: Within Functional Limits for tasks assessed  General Comments  Cues for safety with lines.  Educated on importance of obtaining full knee ext and resting with leg straight in order to get prosthesis in future.  Pt is familiar with this due to L BKA and is motivated.    Exercises Amputee Exercises Quad Sets: AROM;Right;10 reps;Seated Hip ABduction/ADduction: AROM;Right;5 reps;Seated Knee Flexion: AROM;Right;10 reps;Seated Knee  Extension: AROM;Right;10 reps;Seated Straight Leg Raises: AROM;Right;5 reps;Seated   Shoulder Instructions      Home Living Family/patient expects to be discharged to:: Private residence Living Arrangements: Spouse/significant other Available Help at Discharge: Family;Available 24 hours/day Type of Home: Mobile home Home Access: Ramped entrance     Home Layout: One level     Bathroom Shower/Tub: Producer, television/film/video: Handicapped height Bathroom Accessibility: Yes   Home Equipment: Environmental consultant - 2 wheels;Cane - single point;Bedside commode;Shower seat;Grab bars - tub/shower;Hand held shower head;Wheelchair - manual   Additional Comments: L prosthetic      Prior Functioning/Environment Level of Independence: Needs assistance  Gait / Transfers Assistance Needed: Pt was walking with L prosthetic but having pain in R foot so was using w/c most of the time recently.  If he did walk did not use AD. ADL's / Homemaking Assistance Needed: Pt was independent with ADLs , had assist with IADLs - could assist some with IADLs   Comments: used cane PTA, cared for his own ADL, drove on occasion.        OT Problem List: Impaired balance (sitting and/or standing);Pain;Decreased activity tolerance;Decreased knowledge of use of DME or AE      OT Treatment/Interventions: Self-care/ADL training;DME and/or AE instruction;Therapeutic activities;Balance training;Patient/family education    OT Goals(Current goals can be found in the care plan section) Acute Rehab OT Goals Patient Stated Goal: return home by Saturday; get prosthetic OT Goal Formulation: With patient Time For Goal Achievement: 07/18/20 Potential to Achieve Goals: Good ADL Goals Pt Will Perform Grooming: with set-up;sitting Pt Will Perform Upper Body Bathing: with set-up;sitting Pt Will Perform Lower Body Bathing: with min guard assist;with supervision;with set-up;sitting/lateral leans Pt Will Perform Upper Body Dressing:  with set-up;sitting Pt Will Perform Lower Body Dressing: with min guard assist;with supervision;with set-up;sitting/lateral leans Pt Will Transfer to Toilet: with supervision;with modified independence;stand pivot transfer;anterior/posterior transfer;bedside commode Pt Will Perform Toileting - Clothing Manipulation and hygiene: with min guard assist;with supervision;sitting/lateral leans  OT Frequency: Min 2X/week   Barriers to D/C:            Co-evaluation              AM-PAC OT "6 Clicks" Daily Activity     Outcome Measure Help from another person eating meals?: None Help from another person taking care of personal grooming?: A Little Help from another person toileting, which includes using toliet, bedpan, or urinal?: A Little Help from another person bathing (including washing, rinsing, drying)?: A Little Help from another person to put on and taking off regular upper body clothing?: A Little Help from another person to put on and taking off regular lower body clothing?: A Little 6 Click Score: 19   End of Session    Activity Tolerance: Patient limited by pain Patient left: in chair;with call bell/phone within reach;with nursing/sitter in room  OT Visit Diagnosis: Other abnormalities of gait and mobility (R26.89);Pain Pain - Right/Left: Right Pain - part of body: Leg                Time: 1355-1416 OT Time Calculation (min): 21 min Charges:  OT General Charges $OT Visit: 1 Visit OT Evaluation $OT Eval Moderate Complexity: 1 Mod    Galen Manila 07/04/2020, 4:27 PM

## 2020-07-05 ENCOUNTER — Telehealth: Payer: Self-pay | Admitting: Orthopedic Surgery

## 2020-07-05 ENCOUNTER — Telehealth: Payer: Self-pay | Admitting: Physician Assistant

## 2020-07-05 ENCOUNTER — Other Ambulatory Visit: Payer: Self-pay | Admitting: Physician Assistant

## 2020-07-05 LAB — GLUCOSE, CAPILLARY
Glucose-Capillary: 211 mg/dL — ABNORMAL HIGH (ref 70–99)
Glucose-Capillary: 150 mg/dL — ABNORMAL HIGH (ref 70–99)
Glucose-Capillary: 150 mg/dL — ABNORMAL HIGH (ref 70–99)
Glucose-Capillary: 158 mg/dL — ABNORMAL HIGH (ref 70–99)

## 2020-07-05 MED ORDER — OXYCODONE HCL 10 MG PO TABS
10.0000 mg | ORAL_TABLET | ORAL | 0 refills | Status: AC | PRN
Start: 2020-07-05 — End: ?

## 2020-07-05 NOTE — Telephone Encounter (Signed)
Patient's wife called back. She would like someone to call the pharmacy and give authorization for refill on oxycodone.

## 2020-07-05 NOTE — Telephone Encounter (Signed)
Pt written for Oxycodone 10 mg today and he is wanting the 10/325 please advise if you want to change rx or have him just add tylenol

## 2020-07-05 NOTE — Telephone Encounter (Signed)
I called and sw wife and advised after speaking with PA that he just received an rx for Oxycodone 10 on 06/26/2020 # 75 this is written by pain management and we can not write another rx for the 10/325 it doesn't change that he is still 5 days out from qualifying for refill of the first rx. Advised to call pain management and see if there is something different they want to do for his pain. Call with any other questions.

## 2020-07-05 NOTE — Progress Notes (Signed)
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  He has PMH of arthritis, DM, osteomyelitis, GERD, gout, HTN, MI, neuropathy, CVA, L BKA.    PT Comments    Pt supine in bed.  On arrival resting with R hip ER and R knee flexed.  Edcuated on precautions and reviewed HEP for home use.  Post session.  PTA donned limb protector and educated patient to keep on except when performing exercises.  HHPT remains appropriate.  Pt decline OOB today due to pain post exercises.     Follow Up Recommendations  Home health PT;Supervision - Intermittent     Equipment Recommendations  None recommended by PT    Recommendations for Other Services       Precautions / Restrictions Precautions Precautions: Fall Required Braces or Orthoses: Other Brace Other Brace: R limb protector-was not wearing on arrival and resting in flexed position.  Educated on resting with R knee straight and to avoid placing objects under his knee. Restrictions Weight Bearing Restrictions: Yes RLE Weight Bearing: Non weight bearing    Mobility  Bed Mobility Overal bed mobility: Needs Assistance Bed Mobility: Rolling Rolling: Min assist         General bed mobility comments: Min assistance to roll completely to the R into sidelying for therapeutic exercises.    Transfers Overall transfer level:  (deferred to focus on HEP for home use.)   Transfers: Personal assistant              Ambulation/Gait                 Stairs             Wheelchair Mobility    Modified Rankin (Stroke Patients Only)       Balance Overall balance assessment: Needs assistance Sitting-balance support: No upper extremity supported Sitting balance-Leahy Scale: Good       Standing  balance-Leahy Scale: Poor Standing balance comment: unable                            Cognition Arousal/Alertness: Awake/alert Behavior During Therapy: Impulsive Overall Cognitive Status: Within Functional Limits for tasks assessed                                        Exercises Amputee Exercises Quad Sets: AROM;Both;10 reps;Supine Hip Extension: AROM;Right;10 reps;Sidelying Hip ABduction/ADduction: AROM;Right;10 reps;Sidelying Knee Flexion: AROM;Right;10 reps;Supine Knee Extension: AROM;Right;10 reps;Supine Straight Leg Raises: AROM;Right;Supine;10 reps    General Comments        Pertinent Vitals/Pain Pain Assessment: 0-10 Pain Score: 7  Pain Location: R residual limb Pain Descriptors / Indicators: Burning;Grimacing;Guarding Pain Intervention(s): Monitored during session;Repositioned    Home Living                      Prior Function            PT Goals (current goals can now be found in the care plan section) Acute Rehab PT Goals Patient Stated Goal: return home by Saturday; get prosthetic Potential to Achieve Goals: Good Additional Goals Additional Goal #1: Will perform lateral scoot independently Progress towards PT goals:  Progressing toward goals    Frequency    Min 4X/week      PT Plan Current plan remains appropriate    Co-evaluation              AM-PAC PT "6 Clicks" Mobility   Outcome Measure  Help needed turning from your back to your side while in a flat bed without using bedrails?: None Help needed moving from lying on your back to sitting on the side of a flat bed without using bedrails?: None Help needed moving to and from a bed to a chair (including a wheelchair)?: A Little Help needed standing up from a chair using your arms (e.g., wheelchair or bedside chair)?: Total Help needed to walk in hospital room?: Total Help needed climbing 3-5 steps with a railing? : Total 6 Click Score: 14    End  of Session Equipment Utilized During Treatment: Gait belt Activity Tolerance: Patient tolerated treatment well Patient left: in bed;with call bell/phone within reach;with bed alarm set Nurse Communication: Mobility status PT Visit Diagnosis: Other abnormalities of gait and mobility (R26.89);Muscle weakness (generalized) (M62.81);Pain Pain - Right/Left: Right Pain - part of body: Knee     Time: 9563-8756 PT Time Calculation (min) (ACUTE ONLY): 32 min  Charges:  $Therapeutic Exercise: 23-37 mins                     [name] , PTA Acute Rehabilitation Services Pager [phone] Office [phone]     [name] Delay 07/05/2020, 12:07 PM

## 2020-07-05 NOTE — Plan of Care (Signed)
  Problem: Education: Goal: Knowledge of General Education information will improve Description: Including pain rating scale, medication(s)/side effects and non-pharmacologic comfort measures Outcome: Progressing   Problem: Clinical Measurements: Goal: Will remain free from infection Outcome: Progressing Goal: Diagnostic test results will improve Outcome: Progressing   

## 2020-07-05 NOTE — Discharge Summary (Signed)
Discharge Diagnoses:  Active Problems:   Subacute osteomyelitis, right ankle and foot ([place])   Acute hematogenous osteomyelitis of right foot ([place])   Surgeries: Procedure(s): RIGHT BELOW KNEE AMPUTATION on 07/03/2020    Consultants:   Discharged Condition: Improved  Hospital Course: [name] is an 55 y.o. male who was admitted 07/03/2020 with a chief complaint of Right Foot osteomyelitis, with a final diagnosis of Dehiscence Right Transmetatarsal Amputation.  Patient was brought to the operating room on 07/03/2020 and underwent Procedure(s): RIGHT BELOW KNEE AMPUTATION.    Patient was given perioperative antibiotics:  Anti-infectives (From admission, onward)   Start     Dose/Rate Route Frequency Ordered Stop   07/03/20 2100  clindamycin (CLEOCIN) IVPB 600 mg        600 mg 100 mL/hr over 30 Minutes Intravenous Every 6 hours 07/03/20 1752 07/04/20 0913   07/03/20 1130  clindamycin (CLEOCIN) IVPB 900 mg        900 mg 100 mL/hr over 30 Minutes Intravenous On call to O.R. 07/03/20 1124 07/03/20 1555    .  Patient was given sequential compression devices, early ambulation, and aspirin for DVT prophylaxis.  Recent vital signs:  Patient Vitals for the past 24 hrs:  BP Temp Temp src Pulse Resp SpO2  07/05/20 0300 (!) 148/77 98.7 F (37.1 C) Oral (!) 107 18 96 %  07/04/20 1957 131/86 98.9 F (37.2 C) Oral (!) 105 17 99 %  07/04/20 1700 (!) 153/89 97.9 F (36.6 C) Oral (!) 109 15 94 %  07/04/20 0855 (!) 142/81 99.1 F (37.3 C) Oral (!) 102 20 --  .  Recent laboratory studies: No results found.  Discharge Medications:   Allergies as of 07/05/2020      Reactions   Aspirin Other (See Comments)   Patient says he's a free bleeder, tolerates low dose aspirin   Penicillins Hives, Other (See Comments)   Has patient had a PCN reaction causing immediate rash, facial/tongue/throat swelling, SOB or lightheadedness with hypotension: YES Has patient had a PCN reaction causing severe  rash involving mucus membranes or skin necrosis: NO Has patient had a PCN reaction that required hospitalization: NO Has patient had a PCN reaction occurring within the last 10 years: NO If all of the above answers are "NO", then may proceed with Cephalosporin use.      Medication List    STOP taking these medications   doxycycline 100 MG tablet Commonly known as: VIBRA-TABS   nitroGLYCERIN 0.2 mg/hr patch Commonly known as: NITRODUR - Dosed in mg/24 hr     TAKE these medications   albuterol 108 (90 Base) MCG/ACT inhaler Commonly known as: VENTOLIN HFA Inhale 2 puffs into the lungs every 4 (four) hours as needed for wheezing or shortness of breath. Asthma   amitriptyline 150 MG tablet Commonly known as: ELAVIL Take 150 mg by mouth at bedtime.   amLODipine 5 MG tablet Commonly known as: NORVASC Take 5 mg by mouth daily.   aspirin EC 81 MG tablet Take 81 mg by mouth daily.   CLEAR EYES OP Place 1 drop into both eyes daily as needed (irritation).   diphenhydrAMINE 25 MG tablet Commonly known as: BENADRYL Take 25 mg by mouth daily as needed for allergies.   gabapentin 600 MG tablet Commonly known as: NEURONTIN Take 1,200 mg by mouth every 8 (eight) hours.   HumuLIN N 100 UNIT/ML injection Generic drug: insulin NPH Human Inject 10-15 Units into the skin 2 (two) times daily. Sliding  scale   losartan 50 MG tablet Commonly known as: COZAAR Take 50 mg by mouth daily.   metFORMIN 1000 MG tablet Commonly known as: GLUCOPHAGE Take 1,000 mg by mouth 2 (two) times daily with a meal.   Narcan 4 MG/0.1ML Liqd nasal spray kit Generic drug: naloxone Place 1 spray into the nose as needed (opioid overdose).   omeprazole 20 MG capsule Commonly known as: PRILOSEC Take 20 mg by mouth 2 (two) times daily.   Oxycodone HCl 10 MG Tabs Take 1 tablet (10 mg total) by mouth every 4 (four) hours as needed (pain). What changed: when to take this   pravastatin 20 MG tablet Commonly  known as: PRAVACHOL Take 20 mg by mouth daily.   vitamin C 500 MG tablet Commonly known as: ASCORBIC ACID Take 500 mg by mouth daily.   Vitamin D (Ergocalciferol) 1.25 MG (50000 UNIT) Caps capsule Commonly known as: DRISDOL Take 50,000 Units by mouth every Monday.       Diagnostic Studies: No results found.  Patient benefited maximally from their hospital stay and there were no complications.     Disposition: Discharge disposition: 01-Home or Self Care      Discharge Instructions    Call MD / Call 911   Complete by: As directed    If you experience chest pain or shortness of breath, CALL 911 and be transported to the hospital emergency room.  If you develope a fever above 101 F, pus (white drainage) or increased drainage or redness at the wound, or calf pain, call your surgeon's office.   Constipation Prevention   Complete by: As directed    Drink plenty of fluids.  Prune juice may be helpful.  You may use a stool softener, such as Colace (over the counter) 100 mg twice a day.  Use MiraLax (over the counter) for constipation as needed.   Diet - low sodium heart healthy   Complete by: As directed    Discharge instructions   Complete by: As directed    Show patient how to attach preveena. Should call office if alarms   Increase activity slowly as tolerated   Complete by: As directed    Negative Pressure Wound Therapy - Incisional   Complete by: As directed    Show patient how to attach vac. Call office if alarms      Follow-up Information    Suzan Slick, NP In 1 week.   Specialty: Orthopedic Surgery Contact information: [address] [place] [address] [phone]                Signed: Bevely Palmer Destry Bezdek 07/05/2020, 7:30 AM

## 2020-07-05 NOTE — Telephone Encounter (Signed)
Can add a tylenol with it . This is what he has been taking

## 2020-07-05 NOTE — TOC Initial Note (Addendum)
Transition of Care [name]) - Initial/Assessment Note    Patient Details  Name: [name] MRN: [id number] Date of Birth: [birth year]  [name]) CM/SW Contact:    Epifanio Lesches, RN Phone Number: [phone] 07/05/2020, 10:32 AM  Clinical Narrative:                 Presents with R foot osteomyelitis.. failed foot salvage intervention   - s/p Transtibial amputation, application of Prevena wound VAC, 3/16  From home with wife. States PTA independent with ADL's. DME: W/C, already own and is @ bedside. Javaughn Opdahl (Spouse)     [phone]      NCM shared PT/OT recommendations : Home health PT;Supervision - Intermittent.  States he will be ok without home health services. States he will have the assistance and supervision needed provided by family/friends once d/c.Per pt transportation to home will be provided by [employer] [phone]). NCM called and left voice message regarding transportation need for 07/06/2020...Marland Kitchen  Awaiting call back.  Pt without Rx med concerns or affordability issues.  Post hospital f/u appointment noted on AVS.   TOC team will continue to monitor and assist with TOC needs.   07/05/2020 @ 11:12 am  NCM received call from Valerie/ [employer]. Valerie explained [employer] is contracted with [employer]. States [employer]  ( [phone]) will need to be called to obtain approval for transportation to home. NCM called [employer] spoke with Carrine. Approval received for transportation to home. Pt's pick up time will be @ 11am 07/06/2020 tomorrow, reference # [id number].  Expected Discharge Plan: Home w Home Health Services     Patient Goals and CMS Choice     Choice offered to / list presented to : Patient  Expected Discharge Plan and Services Expected Discharge Plan: Home w Home Health Services   Discharge Planning Services: CM Consult   Living arrangements for the past 2 months: Single Family Home Expected Discharge Date: 07/06/20                         Prior Living Arrangements/Services Living arrangements for the past 2 months: Single Family Home Lives with:: Spouse Patient language and need for interpreter reviewed:: Yes Do you feel safe going back to the place where you live?: Yes      Need for Family Participation in Patient Care: Yes (Comment) Care giver support system in place?: Yes (comment)   Criminal Activity/Legal Involvement Pertinent to Current Situation/Hospitalization: No - Comment as needed  Activities of Daily Living Home Assistive Devices/Equipment: Wheelchair,Blood pressure cuff,CBG Meter,Eyeglasses,Hand-held shower hose,Grab bars in shower,Grab bars around toilet,Raised toilet seat with rails,Built-in shower seat ADL Screening (condition at time of admission) Patient's cognitive ability adequate to safely complete daily activities?: Yes Is the patient deaf or have difficulty hearing?: No Does the patient have difficulty seeing, even when wearing glasses/contacts?: No Does the patient have difficulty concentrating, remembering, or making decisions?: No Patient able to express need for assistance with ADLs?: Yes Does the patient have difficulty dressing or bathing?: No Independently performs ADLs?: Yes (appropriate for developmental age) Does the patient have difficulty walking or climbing stairs?: No Weakness of Legs: Left Weakness of Arms/Hands: None  Permission Sought/Granted   Permission granted to share information with : Yes, Verbal Permission Granted  Share Information with NAME: Devonne Lalani (Spouse) [phone]           Emotional Assessment Appearance:: Appears stated age Attitude/Demeanor/Rapport: Engaged Affect (  typically observed): Accepting Orientation: : Oriented to Self,Oriented to Place,Oriented to  Time,Oriented to Situation Alcohol / Substance Use: Not Applicable Psych Involvement: No (comment)  Admission diagnosis:  Acute hematogenous osteomyelitis of right foot [name])  [M86.071] Patient Active Problem List   Diagnosis Date Noted  . Acute hematogenous osteomyelitis of right foot (HCC) 07/03/2020  . Subacute osteomyelitis, right ankle and foot (HCC)   . Foot ulcer (HCC) 05/15/2020  . Sepsis (HCC) 05/15/2020  . COPD (chronic obstructive pulmonary disease) (HCC) 05/15/2020  . Diabetic foot infection (HCC)   . Acquired absence of left leg below knee (HCC) 12/29/2019  . Abscess of left foot 12/13/2019  . Diabetes mellitus type II, non insulin dependent (HCC) 07/09/2017  . Subacute osteomyelitis, left ankle and foot (HCC) 07/09/2017  . Hypokalemia 07/09/2017  . Hyponatremia 07/09/2017  . AKI (acute kidney injury) (HCC) 07/09/2017  . Symptomatic anemia 07/09/2017  . Essential hypertension 07/09/2017  . Depression with anxiety 07/09/2017  . Venous thrombosis 07/09/2017   PCP:  Alvina Filbert, MD Pharmacy:   [facility] - [place], [place] - [address] [place] [address] Phone: [phone] Fax: [phone]  [facility] - [place], [place] - [address] [place] [address] Phone: [phone] Fax: [phone]     Social Determinants of Health (SDOH) Interventions    Readmission Risk Interventions No flowsheet data found.

## 2020-07-05 NOTE — Progress Notes (Signed)
Patient is postop day 2 status post right below-knee amputation.  Overall he is doing well.  He would like to be discharged to home with home health as needed tomorrow.  He is requesting his pain medication be sent in today so his wife can pick it up.  Would like 10 mg Percocet.  Vital signs stable afebrile alert pleasant wound VAC is functioning 0 cc in the canister there are no green checks  Plan for discharge tomorrow we will call in medication today have again consulted transition of care for any home health needs

## 2020-07-05 NOTE — Telephone Encounter (Signed)
Pt wife called and states thatt her husband needs the oxycodone 3-25. Please give them a call [phone].

## 2020-07-06 LAB — GLUCOSE, CAPILLARY: Glucose-Capillary: 202 mg/dL — ABNORMAL HIGH (ref 70–99)

## 2020-07-06 NOTE — Plan of Care (Signed)
  Problem: Education: Goal: Knowledge of General Education information will improve Description: Including pain rating scale, medication(s)/side effects and non-pharmacologic comfort measures Outcome: Completed/Met   Problem: Health Behavior/Discharge Planning: Goal: Ability to manage health-related needs will improve Outcome: Completed/Met   Problem: Clinical Measurements: Goal: Ability to maintain clinical measurements within normal limits will improve Outcome: Completed/Met Goal: Will remain free from infection Outcome: Completed/Met Goal: Diagnostic test results will improve Outcome: Completed/Met Goal: Respiratory complications will improve Outcome: Completed/Met Goal: Cardiovascular complication will be avoided Outcome: Completed/Met   Problem: Activity: Goal: Risk for activity intolerance will decrease Outcome: Completed/Met   Problem: Nutrition: Goal: Adequate nutrition will be maintained Outcome: Completed/Met   Problem: Coping: Goal: Level of anxiety will decrease Outcome: Completed/Met   Problem: Elimination: Goal: Will not experience complications related to bowel motility Outcome: Completed/Met Goal: Will not experience complications related to urinary retention Outcome: Completed/Met   Problem: Pain Managment: Goal: General experience of comfort will improve Outcome: Completed/Met   Problem: Safety: Goal: Ability to remain free from injury will improve Outcome: Completed/Met   Problem: Skin Integrity: Goal: Risk for impaired skin integrity will decrease Outcome: Completed/Met   Problem: Acute Rehab PT Goals(only PT should resolve) Goal: Patient Will Transfer Sit To/From Stand Outcome: Completed/Met Goal: Pt Will Transfer Bed To Chair/Chair To Bed Outcome: Completed/Met Goal: Pt/caregiver will Perform Home Exercise Program Outcome: Completed/Met   Problem: Acute Rehab PT Goals(only PT should resolve) Goal: PT Additional Goal #1 Outcome:  Completed/Met   Problem: Acute Rehab OT Goals (only OT should resolve) Goal: Pt. Will Perform Grooming Outcome: Completed/Met Goal: Pt. Will Perform Upper Body Bathing Outcome: Completed/Met Goal: Pt. Will Perform Lower Body Bathing Outcome: Completed/Met Goal: Pt. Will Perform Upper Body Dressing Outcome: Completed/Met Goal: Pt. Will Perform Lower Body Dressing Outcome: Completed/Met Goal: Pt. Will Transfer To Toilet Outcome: Completed/Met Goal: Pt. Will Perform Toileting-Clothing Manipulation Outcome: Completed/Met  Discharge instructions reviewed with patient.  These included, but were not limited to, the following:  discharge medications with any changes, instructive care on Provena and wound vac / incision care, follow-up appointments, when to call the MD, dietary instructions, exercises as per PT, infection control procedures, etc.  Comprehension of instructions was ascertained via "teach-back" technique.  Questions were answered to patient's satisfaction.  Patient discharged to private residence with wife via special transport Lucianne Lei.  Pt wheeled self to exit unescorted per his request.

## 2020-07-06 NOTE — Discharge Summary (Signed)
Discharge Diagnoses:  Active Problems:   Subacute osteomyelitis, right ankle and foot ([place])   Acute hematogenous osteomyelitis of right foot ([place])   Surgeries: Procedure(s): RIGHT BELOW KNEE AMPUTATION on 07/03/2020    Consultants:   Discharged Condition: Improved  Hospital Course: [name] is an 55 y.o. male who was admitted 07/03/2020 with a chief complaint of dehiscence right transmetatarsal amputation, with a final diagnosis of Dehiscence Right Transmetatarsal Amputation.  Patient was brought to the operating room on 07/03/2020 and underwent Procedure(s): RIGHT BELOW KNEE AMPUTATION.    Patient was given perioperative antibiotics:  Anti-infectives (From admission, onward)   Start     Dose/Rate Route Frequency Ordered Stop   07/03/20 2100  clindamycin (CLEOCIN) IVPB 600 mg        600 mg 100 mL/hr over 30 Minutes Intravenous Every 6 hours 07/03/20 1752 07/04/20 0913   07/03/20 1130  clindamycin (CLEOCIN) IVPB 900 mg        900 mg 100 mL/hr over 30 Minutes Intravenous On call to O.R. 07/03/20 1124 07/03/20 1555    .  Patient was given sequential compression devices, early ambulation, and aspirin for DVT prophylaxis.  Recent vital signs:  Patient Vitals for the past 24 hrs:  BP Temp Temp src Pulse Resp SpO2  07/06/20 0828 134/80 98.2 F (36.8 C) Oral (!) 101 17 97 %  07/06/20 0443 (!) 142/78 98.8 F (37.1 C) Oral 96 17 98 %  07/05/20 1936 138/74 99 F (37.2 C) Oral 98 16 100 %  07/05/20 1259 (!) 190/90 98 F (36.7 C) -- (!) 104 18 98 %  .  Recent laboratory studies: No results found.  Discharge Medications:   Allergies as of 07/06/2020      Reactions   Aspirin Other (See Comments)   Patient says he's a free bleeder, tolerates low dose aspirin   Penicillins Hives, Other (See Comments)   Has patient had a PCN reaction causing immediate rash, facial/tongue/throat swelling, SOB or lightheadedness with hypotension: YES Has patient had a PCN reaction causing severe  rash involving mucus membranes or skin necrosis: NO Has patient had a PCN reaction that required hospitalization: NO Has patient had a PCN reaction occurring within the last 10 years: NO If all of the above answers are "NO", then may proceed with Cephalosporin use.      Medication List    STOP taking these medications   doxycycline 100 MG tablet Commonly known as: VIBRA-TABS   nitroGLYCERIN 0.2 mg/hr patch Commonly known as: NITRODUR - Dosed in mg/24 hr     TAKE these medications   albuterol 108 (90 Base) MCG/ACT inhaler Commonly known as: VENTOLIN HFA Inhale 2 puffs into the lungs every 4 (four) hours as needed for wheezing or shortness of breath. Asthma   amitriptyline 150 MG tablet Commonly known as: ELAVIL Take 150 mg by mouth at bedtime.   amLODipine 5 MG tablet Commonly known as: NORVASC Take 5 mg by mouth daily.   aspirin EC 81 MG tablet Take 81 mg by mouth daily.   CLEAR EYES OP Place 1 drop into both eyes daily as needed (irritation).   diphenhydrAMINE 25 MG tablet Commonly known as: BENADRYL Take 25 mg by mouth daily as needed for allergies.   gabapentin 600 MG tablet Commonly known as: NEURONTIN Take 1,200 mg by mouth every 8 (eight) hours.   HumuLIN N 100 UNIT/ML injection Generic drug: insulin NPH Human Inject 10-15 Units into the skin 2 (two) times daily. Sliding scale  losartan 50 MG tablet Commonly known as: COZAAR Take 50 mg by mouth daily.   metFORMIN 1000 MG tablet Commonly known as: GLUCOPHAGE Take 1,000 mg by mouth 2 (two) times daily with a meal.   Narcan 4 MG/0.1ML Liqd nasal spray kit Generic drug: naloxone Place 1 spray into the nose as needed (opioid overdose).   omeprazole 20 MG capsule Commonly known as: PRILOSEC Take 20 mg by mouth 2 (two) times daily.   Oxycodone HCl 10 MG Tabs Take 1 tablet (10 mg total) by mouth every 4 (four) hours as needed (pain). What changed: when to take this   pravastatin 20 MG tablet Commonly  known as: PRAVACHOL Take 20 mg by mouth daily.   vitamin C 500 MG tablet Commonly known as: ASCORBIC ACID Take 500 mg by mouth daily.   Vitamin D (Ergocalciferol) 1.25 MG (50000 UNIT) Caps capsule Commonly known as: DRISDOL Take 50,000 Units by mouth every Monday.       Diagnostic Studies: No results found.  Patient benefited maximally from their hospital stay and there were no complications.     Disposition: Discharge disposition: 01-Home or Self Care      Discharge Instructions    Call MD / Call 911   Complete by: As directed    If you experience chest pain or shortness of breath, CALL 911 and be transported to the hospital emergency room.  If you develope a fever above 101 F, pus (white drainage) or increased drainage or redness at the wound, or calf pain, call your surgeon's office.   Call MD / Call 911   Complete by: As directed    If you experience chest pain or shortness of breath, CALL 911 and be transported to the hospital emergency room.  If you develope a fever above 101 F, pus (white drainage) or increased drainage or redness at the wound, or calf pain, call your surgeon's office.   Constipation Prevention   Complete by: As directed    Drink plenty of fluids.  Prune juice may be helpful.  You may use a stool softener, such as Colace (over the counter) 100 mg twice a day.  Use MiraLax (over the counter) for constipation as needed.   Constipation Prevention   Complete by: As directed    Drink plenty of fluids.  Prune juice may be helpful.  You may use a stool softener, such as Colace (over the counter) 100 mg twice a day.  Use MiraLax (over the counter) for constipation as needed.   Diet - low sodium heart healthy   Complete by: As directed    Diet - low sodium heart healthy   Complete by: As directed    Discharge instructions   Complete by: As directed    Show patient how to attach preveena. Should call office if alarms   Increase activity slowly as tolerated    Complete by: As directed    Increase activity slowly as tolerated   Complete by: As directed    Negative Pressure Wound Therapy - Incisional   Complete by: As directed    Show patient how to attach vac. Call office if alarms   Negative Pressure Wound Therapy - Incisional   Complete by: As directed       Follow-up Information    Suzan Slick, NP In 1 week.   Specialty: Orthopedic Surgery Contact information: [address] [place] [address] [phone]  Signed: Newt Minion 07/06/2020, 9:31 AM

## 2020-07-08 LAB — SURGICAL PATHOLOGY

## 2020-07-10 ENCOUNTER — Telehealth: Payer: Self-pay | Admitting: Orthopedic Surgery

## 2020-07-10 NOTE — Telephone Encounter (Signed)
Patient's wife called. Says patient has been vomiting since Sunday evening. Would like someone to call her. [phone]

## 2020-07-10 NOTE — Telephone Encounter (Signed)
I called and sw pt and his wife and advised I reviewed info with the PA and she advised to go to the ER for eval. Not being able to keep anything down by mouth the pt is probably very dehydrated and needs fluids. Pt is not running a fever. The pt's wife asked if he could be given rx for nausea and PA advised that its important to eval the cause of the vomiting. Pt said that he wants to keep his appt tomorrow with Dr. Lajoyce Corners and that they will call PCP to discuss potential anti nausea medication.

## 2020-07-11 ENCOUNTER — Encounter: Payer: Self-pay | Admitting: Orthopedic Surgery

## 2020-07-11 ENCOUNTER — Ambulatory Visit (INDEPENDENT_AMBULATORY_CARE_PROVIDER_SITE_OTHER): Payer: Medicaid Other | Admitting: Physician Assistant

## 2020-07-11 DIAGNOSIS — Z89512 Acquired absence of left leg below knee: Secondary | ICD-10-CM

## 2020-07-11 NOTE — Progress Notes (Signed)
Office Visit Note   Patient: [name]           Date of Birth: 10-17-1965           MRN: [id number] Visit Date: 07/11/2020              Requested by: Alvina Filbert, MD [address] [place],  [place] [address] PCP: Alvina Filbert, MD  Chief Complaint  Patient presents with  . Right Leg - Routine Post Op    07/03/2020 right BKA       HPI: Patient presents today a little over week status post right below-knee amputation.  He is overall doing well.  His pain is being managed by his chronic pain provider and he is happy with this.  Assessment & Plan: Visit Diagnoses: No diagnosis found.  Plan: May begin daily cleansing with antibacterial soap and water keep his sugars under control and elevate his stump.  We will apply his shrinker which she has at home  Follow-Up Instructions: No follow-ups on file.   Ortho Exam  Patient is alert, oriented, no adenopathy, well-dressed, normal affect, normal respiratory effort. Below-knee amputation moderate soft tissue swelling but no erythema.  He does have some bloody drainage.  No ascending cellulitis or signs of infection  Imaging: No results found. No images are attached to the encounter.  Labs: Lab Results  Component Value Date   HGBA1C 9.7 (H) 05/15/2020   HGBA1C 11.4 (H) 12/13/2019   HGBA1C 9.7 (H) 11/09/2018   REPTSTATUS 07/14/2017 FINAL 07/09/2017   REPTSTATUS 07/14/2017 FINAL 07/09/2017   CULT  07/09/2017    NO GROWTH 5 DAYS Performed at [facility], [address], [place], [place] [address]    CULT  07/09/2017    NO GROWTH 5 DAYS Performed at [facility], [address] [place], [place] [address]      Lab Results  Component Value Date   ALBUMIN 3.1 (L) 07/03/2020   ALBUMIN 2.9 (L) 05/14/2020   ALBUMIN 2.9 (L) 12/13/2019    Lab Results  Component Value Date   MG 1.5 (L) 05/17/2020   MG 1.5 (L) 05/16/2020   MG 1.4 (L) 07/11/2017   No results found for: VD25OH  No results found for:  PREALBUMIN CBC EXTENDED Latest Ref Rng & Units 07/03/2020 05/17/2020 05/16/2020  WBC 4.0 - 10.5 K/uL 6.2 5.9 6.0  RBC 4.22 - 5.81 MIL/uL 3.91(L) 3.18(L) 3.06(L)  HGB 13.0 - 17.0 g/dL 3.5(W) 6.5(K) 8.3(L)  HCT 39.0 - 52.0 % 32.4(L) 27.1(L) 25.9(L)  PLT 150 - 400 K/uL 244 273 257  NEUTROABS 1.7 - 7.7 K/uL - - -  LYMPHSABS 0.7 - 4.0 K/uL - - -     There is no height or weight on file to calculate BMI.  Orders:  No orders of the defined types were placed in this encounter.  No orders of the defined types were placed in this encounter.    Procedures: No procedures performed  Clinical Data: No additional findings.  ROS:  All other systems negative, except as noted in the HPI. Review of Systems  Objective: Vital Signs: There were no vitals taken for this visit.  Specialty Comments:  No specialty comments available.  PMFS History: Patient Active Problem List   Diagnosis Date Noted  . Acute hematogenous osteomyelitis of right foot (HCC) 07/03/2020  . Subacute osteomyelitis, right ankle and foot (HCC)   . Foot ulcer (HCC) 05/15/2020  . Sepsis (HCC) 05/15/2020  . COPD (chronic obstructive pulmonary  disease) (HCC) 05/15/2020  . Diabetic foot infection (HCC)   . Acquired absence of left leg below knee (HCC) 12/29/2019  . Abscess of left foot 12/13/2019  . Diabetes mellitus type II, non insulin dependent (HCC) 07/09/2017  . Subacute osteomyelitis, left ankle and foot (HCC) 07/09/2017  . Hypokalemia 07/09/2017  . Hyponatremia 07/09/2017  . AKI (acute kidney injury) (HCC) 07/09/2017  . Symptomatic anemia 07/09/2017  . Essential hypertension 07/09/2017  . Depression with anxiety 07/09/2017  . Venous thrombosis 07/09/2017   Past Medical History:  Diagnosis Date  . Abscess of right foot   . ADHD   . Anemia   . Anxiety   . Arthritis   . Asthma   . COPD (chronic obstructive pulmonary disease) (HCC)   . Diabetes mellitus   . Diabetic foot ulcer (HCC) 07/09/2017  . Diabetic  ulcer of back associated with type 2 diabetes mellitus, limited to breakdown of skin (HCC)   . Foot osteomyelitis, left (HCC)   . GERD (gastroesophageal reflux disease)   . Gout   . History of blood transfusion   . Hypertension   . Myocardial infarction [facility])    ? 2008,  states he did not have a cath done and never saw a cardiologist  . Neuropathy   . Pancreatitis   . Stroke [facility])    TIA- no residual  . Wears glasses     No family history on file.  Past Surgical History:  Procedure Laterality Date  . AMPUTATION Left 11/09/2018   Procedure: LEFT TRANSMETATARSAL AMPUTATION;  [name]: Nadara Mustard, MD;  Location[facility];  Service: Orthopedics;  Laterality: Left;  . AMPUTATION Left 12/13/2019   Procedure: LEFT BELOW KNEE AMPUTATION;  [name]: Nadara Mustard, MD;  Location: [facility] OR;  Service: Orthopedics;  Laterality: Left;  . AMPUTATION Right 05/15/2020   Procedure: RIGHT TRANSMETATARSAL AMPUTATION;  [name]: Nadara Mustard, MD;  Location: [facility] OR;  Service: Orthopedics;  Laterality: Right;  . AMPUTATION Right 07/03/2020   Procedure: RIGHT BELOW KNEE AMPUTATION;  [name]: Nadara Mustard, MD;  Location: [facility] OR;  Service: Orthopedics;  Laterality: Right;  Right below knee amputation  . APPENDECTOMY    . BELOW KNEE LEG AMPUTATION Right 07/03/2020  . FOOT SURGERY Left   . LAPAROSCOPIC APPENDECTOMY  07/28/2011   Procedure: APPENDECTOMY LAPAROSCOPIC;  [name]: Fabio Bering, MD;  Location: [facility] ORS;  Service: General;  Laterality: N/A;  . LEG SURGERY     Social History   Occupational History  . Not on file  Tobacco Use  . Smoking status: Former Smoker    Types: Cigarettes  . Smokeless tobacco: Never Used  . Tobacco comment: quit smoking cigarettes 27 years ago 09/05/19  Vaping Use  . Vaping Use: Never used  Substance and Sexual Activity  . Alcohol use: Yes    Comment: occasionally  . Drug use: Not Currently    Types: Marijuana  . Sexual activity: Not on file

## 2020-07-16 ENCOUNTER — Telehealth: Payer: Self-pay | Admitting: Orthopedic Surgery

## 2020-07-16 NOTE — Telephone Encounter (Signed)
Ann with [facility] called stating when she called to check on the pt he mentioned something about a home nurse coming to change his bandages and help with his wound care. Dewayne Hatch would like a CB to know if Dr. Lajoyce Corners was planning to send a home nurse and if so what is the status of the nurse?  Ann CB# [phone]

## 2020-07-16 NOTE — Telephone Encounter (Signed)
I called and sw ANN and advised that as of the last office visit the pt was to wash the residual  limb with soap and water and apply shrinker daily. No HHN needs at this time. Will call with any other questions.

## 2020-07-18 ENCOUNTER — Ambulatory Visit (INDEPENDENT_AMBULATORY_CARE_PROVIDER_SITE_OTHER): Payer: Medicaid Other | Admitting: Orthopedic Surgery

## 2020-07-18 ENCOUNTER — Ambulatory Visit (INDEPENDENT_AMBULATORY_CARE_PROVIDER_SITE_OTHER): Payer: Medicaid Other

## 2020-07-18 ENCOUNTER — Encounter: Payer: Self-pay | Admitting: Orthopedic Surgery

## 2020-07-18 DIAGNOSIS — Z89511 Acquired absence of right leg below knee: Secondary | ICD-10-CM

## 2020-07-18 DIAGNOSIS — M542 Cervicalgia: Secondary | ICD-10-CM

## 2020-07-18 DIAGNOSIS — Z89512 Acquired absence of left leg below knee: Secondary | ICD-10-CM

## 2020-07-18 NOTE — Progress Notes (Signed)
Office Visit Note   Patient: [name]           Date of Birth: 10-15-1965           MRN: [id number] Visit Date: 07/18/2020              Requested by: Alvina Filbert, MD [address] [place],  [place] [address] PCP: Alvina Filbert, MD  Chief Complaint  Patient presents with  . Right Leg - Routine Post Op    07/03/20 right BKA   . Neck - Pain      HPI: Patient is a 55 year old gentleman who is 2 weeks status post right below the knee amputation he is also a left below the knee amputation and has a prosthesis on the left.  He is currently not wearing his stump shrinker is wearing the limb protector.  Patient states he has been having some neck pain with some radicular symptoms down his right upper extremity and sometimes down the left upper extremity.  Patient states that he needs home health assistance.  In review of his medical records he told the case manager that he did not need  Assessment & Plan: Visit Diagnoses:  1. Neck pain   2. Acquired absence of left leg below knee (HCC)   3. History of right below knee amputation (HCC)     Plan: Recommended heat for the cervical spine as well as Aleve 2 p.o. twice daily.  Recommended wearing the stump shrinker around-the-clock we will check with home health to see what services are available.  Follow-Up Instructions: No follow-ups on file.   Ortho Exam  Patient is alert, oriented, no adenopathy, well-dressed, normal affect, normal respiratory effort. Examination the surgical incision is healing well there is no redness no cellulitis no drainage no ischemic changes.  Patient has increased swelling in the right lower extremity secondary to not wearing his stump shrinker.  Patient has no focal motor weakness in either upper extremity.  Imaging: XR Cervical Spine 2 or 3 views  Result Date: 07/18/2020 2 view radiographs of the cervical spine shows a normal lordosis there is some posterior joint space narrowing but no  osteophytic bone spurs.  No images are attached to the encounter.  Labs: Lab Results  Component Value Date   HGBA1C 9.7 (H) 05/15/2020   HGBA1C 11.4 (H) 12/13/2019   HGBA1C 9.7 (H) 11/09/2018   REPTSTATUS 07/14/2017 FINAL 07/09/2017   REPTSTATUS 07/14/2017 FINAL 07/09/2017   CULT  07/09/2017    NO GROWTH 5 DAYS Performed at [facility], [address], [place], [place] [address]    CULT  07/09/2017    NO GROWTH 5 DAYS Performed at [facility], [address] [place], [place] [address]      Lab Results  Component Value Date   ALBUMIN 3.1 (L) 07/03/2020   ALBUMIN 2.9 (L) 05/14/2020   ALBUMIN 2.9 (L) 12/13/2019    Lab Results  Component Value Date   MG 1.5 (L) 05/17/2020   MG 1.5 (L) 05/16/2020   MG 1.4 (L) 07/11/2017   No results found for: VD25OH  No results found for: PREALBUMIN CBC EXTENDED Latest Ref Rng & Units 07/03/2020 05/17/2020 05/16/2020  WBC 4.0 - 10.5 K/uL 6.2 5.9 6.0  RBC 4.22 - 5.81 MIL/uL 3.91(L) 3.18(L) 3.06(L)  HGB 13.0 - 17.0 g/dL 7.4(Q) 5.9(D) 8.3(L)  HCT 39.0 - 52.0 % 32.4(L) 27.1(L) 25.9(L)  PLT 150 - 400 K/uL 244 273 257  NEUTROABS 1.7 - 7.7 K/uL - - -  LYMPHSABS 0.7 - 4.0 K/uL - - -     There is no height or weight on file to calculate BMI.  Orders:  Orders Placed This Encounter  Procedures  . XR Cervical Spine 2 or 3 views   No orders of the defined types were placed in this encounter.    Procedures: No procedures performed  Clinical Data: No additional findings.  ROS:  All other systems negative, except as noted in the HPI. Review of Systems  Objective: Vital Signs: There were no vitals taken for this visit.  Specialty Comments:  No specialty comments available.  PMFS History: Patient Active Problem List   Diagnosis Date Noted  . Acute hematogenous osteomyelitis of right foot (HCC) 07/03/2020  . Subacute osteomyelitis, right ankle and foot (HCC)   . Foot ulcer (HCC) 05/15/2020  . Sepsis (HCC) 05/15/2020  . COPD  (chronic obstructive pulmonary disease) (HCC) 05/15/2020  . Diabetic foot infection (HCC)   . Acquired absence of left leg below knee (HCC) 12/29/2019  . Abscess of left foot 12/13/2019  . Diabetes mellitus type II, non insulin dependent (HCC) 07/09/2017  . Subacute osteomyelitis, left ankle and foot (HCC) 07/09/2017  . Hypokalemia 07/09/2017  . Hyponatremia 07/09/2017  . AKI (acute kidney injury) (HCC) 07/09/2017  . Symptomatic anemia 07/09/2017  . Essential hypertension 07/09/2017  . Depression with anxiety 07/09/2017  . Venous thrombosis 07/09/2017   Past Medical History:  Diagnosis Date  . Abscess of right foot   . ADHD   . Anemia   . Anxiety   . Arthritis   . Asthma   . COPD (chronic obstructive pulmonary disease) (HCC)   . Diabetes mellitus   . Diabetic foot ulcer (HCC) 07/09/2017  . 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 Foot osteomyelitis, left (HCC)   . GERD (gastroesophageal reflux disease)   . Gout   . History of blood transfusion   . Hypertension   . Myocardial infarction [facility])    ? 2008,  states he did not have a cath done and never saw a cardiologist  . Neuropathy   . Pancreatitis   . Stroke [facility])    TIA- no residual  . Wears glasses     No family history on file.  Past Surgical History:  Procedure Laterality Date  . AMPUTATION Left 11/09/2018   Procedure: LEFT TRANSMETATARSAL AMPUTATION;  Surgeon: Nadara Mustard, MD;  Location: [facility] OR;  Service: Orthopedics;  Laterality: Left;  . AMPUTATION Left 12/13/2019   Procedure: LEFT BELOW KNEE AMPUTATION;  Surgeon: Nadara Mustard, MD;  Location: [facility] OR;  Service: Orthopedics;  Laterality: Left;  . AMPUTATION Right 05/15/2020   Procedure: RIGHT TRANSMETATARSAL AMPUTATION;  Surgeon: Nadara Mustard, MD;  Location: [facility] OR;  Service: Orthopedics;  Laterality: Right;  . AMPUTATION Right 07/03/2020   Procedure: RIGHT BELOW KNEE AMPUTATION;  Surgeon: Nadara Mustard, MD;  Location: [facility] OR;  Service: Orthopedics;  Laterality: Right;  Right below knee amputation  . APPENDECTOMY    . BELOW KNEE LEG AMPUTATION Right 07/03/2020  . FOOT SURGERY Left   . LAPAROSCOPIC APPENDECTOMY  07/28/2011   Procedure: APPENDECTOMY LAPAROSCOPIC;  Surgeon: Fabio Bering, MD;  Location: [facility] ORS;  Service: General;  Laterality: N/A;  . LEG SURGERY     Social History   Occupational History  . Not on file  Tobacco Use  . Smoking status: Former Smoker    Types:  Cigarettes  . Smokeless tobacco: Never Used  . Tobacco comment: quit smoking cigarettes 27 years ago 09/05/19  Vaping Use  . Vaping Use: Never used  Substance and Sexual Activity  . Alcohol use: Yes    Comment: occasionally  . Drug use: Not Currently    Types: Marijuana  . Sexual activity: Not on file

## 2020-07-19 ENCOUNTER — Telehealth: Payer: Self-pay

## 2020-07-19 NOTE — Telephone Encounter (Signed)
Sent referral to [facility] to see if they can take the pt and they do not participate with the pt insurance. Sent referral for [facility] to see if they can pick up the pt for HHPT and [facility] will hold this message pending advisement.

## 2020-07-24 ENCOUNTER — Other Ambulatory Visit: Payer: Self-pay

## 2020-07-24 NOTE — Telephone Encounter (Signed)
Information sent to [facility] fax # [phone]

## 2020-07-25 ENCOUNTER — Encounter: Payer: Self-pay | Admitting: Orthopedic Surgery

## 2020-07-25 ENCOUNTER — Ambulatory Visit (INDEPENDENT_AMBULATORY_CARE_PROVIDER_SITE_OTHER): Payer: Medicaid Other | Admitting: Physician Assistant

## 2020-07-25 DIAGNOSIS — L97514 Non-pressure chronic ulcer of other part of right foot with necrosis of bone: Secondary | ICD-10-CM

## 2020-07-25 NOTE — Progress Notes (Signed)
Office Visit Note   Patient: [name]           Date of Birth: [birth year]           MRN: [id number] Visit Date: 07/25/2020              Requested by: Alvina Filbert, MD [address] [place],  [place] [address] PCP: Alvina Filbert, MD  Chief Complaint  Patient presents with  . Right Leg - Follow-up      HPI: Patient is a pleasant 55 year old gentleman who is 3-1/2 weeks status post right below-knee amputation.  He has been wearing his shrinker though putting a dressing.  He has been trying to elevate as we had asked him.  Assessment & Plan: Visit Diagnoses: No diagnosis found.  Plan: I have asked him to get a smaller shrinker he has several sizes at home he believes.  Follow-up in 2 weeks continue to elevate  Follow-Up Instructions: No follow-ups on file.   Ortho Exam  Patient is alert, oriented, no adenopathy, well-dressed, normal affect, normal respiratory effort. Examination of the right below-knee amputation stump well apposed wound edges staples are in place.  He has 1 very small area of irritation in the central portion that is about a centimeter with 2 staples buried beneath it.  All the staples were removed today.  Wound remained well approximated no ascending cellulitis shrinker was placed against the skin.  Imaging: No results found. No images are attached to the encounter.  Labs: Lab Results  Component Value Date   HGBA1C 9.7 (H) 05/15/2020   HGBA1C 11.4 (H) 12/13/2019   HGBA1C 9.7 (H) 11/09/2018   REPTSTATUS 07/14/2017 FINAL 07/09/2017   REPTSTATUS 07/14/2017 FINAL 07/09/2017   CULT  07/09/2017    NO GROWTH 5 DAYS Performed at [facility], [address] [place], [place] [address]    CULT  07/09/2017    NO GROWTH 5 DAYS Performed at [facility], [address], [place], [place] [address]      Lab Results  Component Value Date   ALBUMIN 3.1 (L) 07/03/2020   ALBUMIN 2.9 (L) 05/14/2020   ALBUMIN 2.9 (L) 12/13/2019    Lab Results   Component Value Date   MG 1.5 (L) 05/17/2020   MG 1.5 (L) 05/16/2020   MG 1.4 (L) 07/11/2017   No results found for: VD25OH  No results found for: PREALBUMIN CBC EXTENDED Latest Ref Rng & Units 07/03/2020 05/17/2020 05/16/2020  WBC 4.0 - 10.5 K/uL 6.2 5.9 6.0  RBC 4.22 - 5.81 MIL/uL 3.91(L) 3.18(L) 3.06(L)  HGB 13.0 - 17.0 g/dL 7.2(Z) 3.6(U) 8.3(L)  HCT 39.0 - 52.0 % 32.4(L) 27.1(L) 25.9(L)  PLT 150 - 400 K/uL 244 273 257  NEUTROABS 1.7 - 7.7 K/uL - - -  LYMPHSABS 0.7 - 4.0 K/uL - - -     There is no height or weight on file to calculate BMI.  Orders:  No orders of the defined types were placed in this encounter.  No orders of the defined types were placed in this encounter.    Procedures: No procedures performed  Clinical Data: No additional findings.  ROS:  All other systems negative, except as noted in the HPI. Review of Systems  Objective: Vital Signs: There were no vitals taken for this visit.  Specialty Comments:  No specialty comments available.  PMFS History: Patient Active Problem List   Diagnosis Date Noted  . Acute hematogenous osteomyelitis of right foot (HCC) 07/03/2020  .  Subacute osteomyelitis, right ankle and foot (HCC)   . Foot ulcer (HCC) 05/15/2020  . Sepsis (HCC) 05/15/2020  . COPD (chronic obstructive pulmonary disease) (HCC) 05/15/2020  . Diabetic foot infection (HCC)   . Acquired absence of left leg below knee (HCC) 12/29/2019  . Abscess of left foot 12/13/2019  . Diabetes mellitus type II, non insulin dependent (HCC) 07/09/2017  . Subacute osteomyelitis, left ankle and foot (HCC) 07/09/2017  . Hypokalemia 07/09/2017  . Hyponatremia 07/09/2017  . AKI (acute kidney injury) (HCC) 07/09/2017  . Symptomatic anemia 07/09/2017  . Essential hypertension 07/09/2017  . Depression with anxiety 07/09/2017  . Venous thrombosis 07/09/2017   Past Medical History:  Diagnosis Date  . Abscess of right foot   . ADHD   . Anemia   . Anxiety   .  Arthritis   . Asthma   . COPD (chronic obstructive pulmonary disease) (HCC)   . Diabetes mellitus   . Diabetic foot ulcer (HCC) 07/09/2017  . 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 Foot osteomyelitis, left (HCC)   . GERD (gastroesophageal reflux disease)   . Gout   . History of blood transfusion   . Hypertension   . Myocardial infarction [facility])    ? 2008,  states he did not have a cath done and never saw a cardiologist  . Neuropathy   . Pancreatitis   . Stroke [facility])    TIA- no residual  . Wears glasses     No family history on file.  Past Surgical History:  Procedure Laterality Date  . AMPUTATION Left 11/09/2018   Procedure: LEFT TRANSMETATARSAL AMPUTATION;  Surgeon: Nadara Mustard, MD;  Location: [facility] OR;  Service: Orthopedics;  Laterality: Left;  . AMPUTATION Left 12/13/2019   Procedure: LEFT BELOW KNEE AMPUTATION;  Surgeon: Nadara Mustard, MD;  Location: [facility] OR;  Service: Orthopedics;  Laterality: Left;  . AMPUTATION Right 05/15/2020   Procedure: RIGHT TRANSMETATARSAL AMPUTATION;  Surgeon: Nadara Mustard, MD;  Location: [facility] OR;  Service: Orthopedics;  Laterality: Right;  . AMPUTATION Right 07/03/2020   Procedure: RIGHT BELOW KNEE AMPUTATION;  Surgeon: Nadara Mustard, MD;  Location: [facility] OR;  Service: Orthopedics;  Laterality: Right;  Right below knee amputation  . APPENDECTOMY    . BELOW KNEE LEG AMPUTATION Right 07/03/2020  . FOOT SURGERY Left   . LAPAROSCOPIC APPENDECTOMY  07/28/2011   Procedure: APPENDECTOMY LAPAROSCOPIC;  Surgeon: Fabio Bering, MD;  Location: [facility] ORS;  Service: General;  Laterality: N/A;  . LEG SURGERY     Social History   Occupational History  . Not on file  Tobacco Use  . Smoking status: Former Smoker    Types: Cigarettes  . Smokeless tobacco: Never Used  . Tobacco comment: quit smoking cigarettes 27 years ago 09/05/19  Vaping Use  . Vaping Use: Never used  Substance and Sexual Activity  .  Alcohol use: Yes    Comment: occasionally  . Drug use: Not Currently    Types: Marijuana  . Sexual activity: Not on file

## 2020-08-05 ENCOUNTER — Telehealth: Payer: Self-pay | Admitting: Orthopedic Surgery

## 2020-08-05 NOTE — Telephone Encounter (Signed)
Being treated by [facility].

## 2020-08-05 NOTE — Telephone Encounter (Signed)
[name] with [facility] called wanting to make sure the pt was still being set up for PT; I did let her know according to epic the referral was sent and is ready for scheduling. [name] also mentioned that the pt informed her Dr. Lajoyce Corners instructed him to wear a sock over his wound and it's causing leakage which then dries up and makes it more painful for the pt to remove the sock. Pt would like to use ace bandages instead and would like a CB with an ok for this.   [name] CB# [phone] Pt CB# [phone]

## 2020-08-06 NOTE — Telephone Encounter (Signed)
I called and sw pt advised that he is doing what he needs to do with applying the shrinker to the incision directly and what tissue is removed when he takes the shrinker off is nonviable tissue and that he is debriding the area daily with removal. PT will be after he obtains his prosthetic. Pt has an  appt on Thursday. Voiced understanding and will call with any other questions.

## 2020-08-08 ENCOUNTER — Ambulatory Visit: Payer: Self-pay

## 2020-08-08 ENCOUNTER — Ambulatory Visit (INDEPENDENT_AMBULATORY_CARE_PROVIDER_SITE_OTHER): Payer: Medicaid Other | Admitting: Orthopedic Surgery

## 2020-08-08 ENCOUNTER — Ambulatory Visit (INDEPENDENT_AMBULATORY_CARE_PROVIDER_SITE_OTHER): Payer: Medicaid Other

## 2020-08-08 ENCOUNTER — Encounter: Payer: Self-pay | Admitting: Orthopedic Surgery

## 2020-08-08 DIAGNOSIS — Z89511 Acquired absence of right leg below knee: Secondary | ICD-10-CM

## 2020-08-08 DIAGNOSIS — M25532 Pain in left wrist: Secondary | ICD-10-CM

## 2020-08-08 DIAGNOSIS — Z89512 Acquired absence of left leg below knee: Secondary | ICD-10-CM

## 2020-08-08 DIAGNOSIS — M25531 Pain in right wrist: Secondary | ICD-10-CM

## 2020-08-08 NOTE — Progress Notes (Signed)
Office Visit Note   Patient: [name]           Date of Birth: April 17, 1966           MRN: [id number] Visit Date: 08/08/2020              Requested by: Alvina Filbert, MD [address] [place],  [place] [address] PCP: Alvina Filbert, MD  Chief Complaint  Patient presents with  . Right Leg - Routine Post Op    07/03/20 right BKA   . Right Wrist - Pain  . Left Wrist - Pain      HPI: Patient is a 55 year old gentleman who presents for 2 separate issues.  #1 he is 4 weeks status post right below the knee amputation.  He is wearing his stump shrinker he feels like he is making excellent progress.  #2 patient has been having bilateral wrist pain worse on the right than the left.  Patient is using his hands exclusively for ambulation as well as wheelchair mobility.  Patient has no acute trauma no falls.  Assessment & Plan: Visit Diagnoses:  1. Bilateral wrist pain   2. S/P bilateral BKA (below knee amputation) (HCC)     Plan: Discussed he could use Voltaren gel, topically to the wrists.  We will give him to Velcro wrist splints to see if this will help unload the pressure from his wrist.  Patient will follow-up with [facility] for prosthetic fitting in 2 to 3 weeks.  Follow-Up Instructions: Return in about 2 months (around 10/08/2020).   Ortho Exam  Patient is alert, oriented, no adenopathy, well-dressed, normal affect, normal respiratory effort. Examination right wrist is tender to palpation of the scapholunate interval.  There is no redness no cellulitis no signs of gout or acute infection.  Examination of left wrist he has a global tenderness to palpation.  Decreased grip strength in both hands no carpal tunnel symptoms.  Imaging: XR Wrist 2 Views Left  Result Date: 08/08/2020 2 view radiographs of the left wrist shows no widening of the scapholunate interval no evidence of fractures.  XR Wrist 2 Views Right  Result Date: 08/08/2020 2 view radiographs of the right wrist  shows widening of the scapholunate interval without evidence of fracture.     Labs: Lab Results  Component Value Date   HGBA1C 9.7 (H) 05/15/2020   HGBA1C 11.4 (H) 12/13/2019   HGBA1C 9.7 (H) 11/09/2018   REPTSTATUS 07/14/2017 FINAL 07/09/2017   REPTSTATUS 07/14/2017 FINAL 07/09/2017   CULT  07/09/2017    NO GROWTH 5 DAYS Performed at [facility], [address] [place], [place] [address]    CULT  07/09/2017    NO GROWTH 5 DAYS Performed at [facility], [address], [place], [place] [address]      Lab Results  Component Value Date   ALBUMIN 3.1 (L) 07/03/2020   ALBUMIN 2.9 (L) 05/14/2020   ALBUMIN 2.9 (L) 12/13/2019    Lab Results  Component Value Date   MG 1.5 (L) 05/17/2020   MG 1.5 (L) 05/16/2020   MG 1.4 (L) 07/11/2017   No results found for: VD25OH  No results found for: PREALBUMIN CBC EXTENDED Latest Ref Rng & Units 07/03/2020 05/17/2020 05/16/2020  WBC 4.0 - 10.5 K/uL 6.2 5.9 6.0  RBC 4.22 - 5.81 MIL/uL 3.91(L) 3.18(L) 3.06(L)  HGB 13.0 - 17.0 g/dL 0.3(K) 7.4(Q) 8.3(L)  HCT 39.0 - 52.0 % 32.4(L) 27.1(L) 25.9(L)  PLT 150 - 400 K/uL 244  273 257  NEUTROABS 1.7 - 7.7 K/uL - - -  LYMPHSABS 0.7 - 4.0 K/uL - - -     There is no height or weight on file to calculate BMI.  Orders:  Orders Placed This Encounter  Procedures  . XR Wrist 2 Views Right  . XR Wrist 2 Views Left   No orders of the defined types were placed in this encounter.    Procedures: No procedures performed  Clinical Data: No additional findings.  ROS:  All other systems negative, except as noted in the HPI. Review of Systems  Objective: Vital Signs: There were no vitals taken for this visit.  Specialty Comments:  No specialty comments available.  PMFS History: Patient Active Problem List   Diagnosis Date Noted  . Acute hematogenous osteomyelitis of right foot (HCC) 07/03/2020  . Subacute osteomyelitis, right ankle and foot (HCC)   . Foot ulcer (HCC) 05/15/2020  .  Sepsis (HCC) 05/15/2020  . COPD (chronic obstructive pulmonary disease) (HCC) 05/15/2020  . Diabetic foot infection (HCC)   . Acquired absence of left leg below knee (HCC) 12/29/2019  . Abscess of left foot 12/13/2019  . Diabetes mellitus type II, non insulin dependent (HCC) 07/09/2017  . Subacute osteomyelitis, left ankle and foot (HCC) 07/09/2017  . Hypokalemia 07/09/2017  . Hyponatremia 07/09/2017  . AKI (acute kidney injury) (HCC) 07/09/2017  . Symptomatic anemia 07/09/2017  . Essential hypertension 07/09/2017  . Depression with anxiety 07/09/2017  . Venous thrombosis 07/09/2017   Past Medical History:  Diagnosis Date  . Abscess of right foot   . ADHD   . Anemia   . Anxiety   . Arthritis   . Asthma   . COPD (chronic obstructive pulmonary disease) (HCC)   . Diabetes mellitus   . Diabetic foot ulcer (HCC) 07/09/2017  . 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 Foot osteomyelitis, left (HCC)   . GERD (gastroesophageal reflux disease)   . Gout   . History of blood transfusion   . Hypertension   . Myocardial infarction [facility])    ? 2008,  states he did not have a cath done and never saw a cardiologist  . Neuropathy   . Pancreatitis   . Stroke [facility])    TIA- no residual  . Wears glasses     History reviewed. No pertinent family history.  Past Surgical History:  Procedure Laterality Date  . AMPUTATION Left 11/09/2018   Procedure: LEFT TRANSMETATARSAL AMPUTATION;  Surgeon: Nadara Mustard, MD;  Location: [facility] OR;  Service: Orthopedics;  Laterality: Left;  . AMPUTATION Left 12/13/2019   Procedure: LEFT BELOW KNEE AMPUTATION;  Surgeon: Nadara Mustard, MD;  Location: [facility] OR;  Service: Orthopedics;  Laterality: Left;  . AMPUTATION Right 05/15/2020   Procedure: RIGHT TRANSMETATARSAL AMPUTATION;  Surgeon: Nadara Mustard, MD;  Location: [facility] OR;  Service: Orthopedics;  Laterality: Right;  . AMPUTATION Right 07/03/2020   Procedure:  RIGHT BELOW KNEE AMPUTATION;  Surgeon: Nadara Mustard, MD;  Location: [facility] OR;  Service: Orthopedics;  Laterality: Right;  Right below knee amputation  . APPENDECTOMY    . BELOW KNEE LEG AMPUTATION Right 07/03/2020  . FOOT SURGERY Left   . LAPAROSCOPIC APPENDECTOMY  07/28/2011   Procedure: APPENDECTOMY LAPAROSCOPIC;  Surgeon: Fabio Bering, MD;  Location: [facility] ORS;  Service: General;  Laterality: N/A;  . LEG SURGERY     Social History   Occupational History  .  Not on file  Tobacco Use  . Smoking status: Former Smoker    Types: Cigarettes  . Smokeless tobacco: Never Used  . Tobacco comment: quit smoking cigarettes 27 years ago 09/05/19  Vaping Use  . Vaping Use: Never used  Substance and Sexual Activity  . Alcohol use: Yes    Comment: occasionally  . Drug use: Not Currently    Types: Marijuana  . Sexual activity: Not on file

## 2020-08-18 ENCOUNTER — Emergency Department (HOSPITAL_COMMUNITY): Payer: Medicaid Other

## 2020-08-18 ENCOUNTER — Encounter (HOSPITAL_COMMUNITY): Payer: Self-pay | Admitting: Emergency Medicine

## 2020-08-18 ENCOUNTER — Other Ambulatory Visit (HOSPITAL_COMMUNITY): Payer: Medicaid Other

## 2020-08-18 ENCOUNTER — Emergency Department (HOSPITAL_COMMUNITY)
Admission: EM | Admit: 2020-08-18 | Discharge: 2020-08-18 | Disposition: A | Discharge status: Home / Self Care | Payer: Medicaid Other | Attending: Emergency Medicine | Admitting: Emergency Medicine

## 2020-08-18 ENCOUNTER — Other Ambulatory Visit: Payer: Self-pay

## 2020-08-18 DIAGNOSIS — W19XXXA Unspecified fall, initial encounter: Secondary | ICD-10-CM

## 2020-08-18 DIAGNOSIS — L97509 Non-pressure chronic ulcer of other part of unspecified foot with unspecified severity: Secondary | ICD-10-CM | POA: Diagnosis not present

## 2020-08-18 DIAGNOSIS — J449 Chronic obstructive pulmonary disease, unspecified: Secondary | ICD-10-CM | POA: Diagnosis not present

## 2020-08-18 DIAGNOSIS — M542 Cervicalgia: Secondary | ICD-10-CM | POA: Insufficient documentation

## 2020-08-18 DIAGNOSIS — W07XXXA Fall from chair, initial encounter: Secondary | ICD-10-CM | POA: Insufficient documentation

## 2020-08-18 DIAGNOSIS — I2510 Atherosclerotic heart disease of native coronary artery without angina pectoris: Secondary | ICD-10-CM | POA: Insufficient documentation

## 2020-08-18 DIAGNOSIS — E11621 Type 2 diabetes mellitus with foot ulcer: Secondary | ICD-10-CM | POA: Diagnosis not present

## 2020-08-18 DIAGNOSIS — F10929 Alcohol use, unspecified with intoxication, unspecified: Secondary | ICD-10-CM | POA: Insufficient documentation

## 2020-08-18 DIAGNOSIS — R55 Syncope and collapse: Secondary | ICD-10-CM | POA: Diagnosis not present

## 2020-08-18 DIAGNOSIS — Z87891 Personal history of nicotine dependence: Secondary | ICD-10-CM | POA: Insufficient documentation

## 2020-08-18 DIAGNOSIS — K219 Gastro-esophageal reflux disease without esophagitis: Secondary | ICD-10-CM | POA: Insufficient documentation

## 2020-08-18 DIAGNOSIS — S0990XA Unspecified injury of head, initial encounter: Secondary | ICD-10-CM | POA: Insufficient documentation

## 2020-08-18 DIAGNOSIS — I10 Essential (primary) hypertension: Secondary | ICD-10-CM | POA: Insufficient documentation

## 2020-08-18 DIAGNOSIS — J45909 Unspecified asthma, uncomplicated: Secondary | ICD-10-CM | POA: Diagnosis not present

## 2020-08-18 DIAGNOSIS — Y92015 Private garage of single-family (private) house as the place of occurrence of the external cause: Secondary | ICD-10-CM | POA: Insufficient documentation

## 2020-08-18 DIAGNOSIS — R10817 Generalized abdominal tenderness: Secondary | ICD-10-CM | POA: Insufficient documentation

## 2020-08-18 LAB — CBC
HCT: 30.6 % — ABNORMAL LOW (ref 39.0–52.0)
Hemoglobin: 9.5 g/dL — ABNORMAL LOW (ref 13.0–17.0)
MCH: 24.4 pg — ABNORMAL LOW (ref 26.0–34.0)
MCHC: 31 g/dL (ref 30.0–36.0)
MCV: 78.5 fL — ABNORMAL LOW (ref 80.0–100.0)
Platelets: 223 10*3/uL (ref 150–400)
RBC: 3.9 MIL/uL — ABNORMAL LOW (ref 4.22–5.81)
RDW: 14.8 % (ref 11.5–15.5)
WBC: 7.9 10*3/uL (ref 4.0–10.5)
nRBC: 0 % (ref 0.0–0.2)

## 2020-08-18 LAB — COMPREHENSIVE METABOLIC PANEL
ALT: 8 U/L (ref 0–44)
AST: 14 U/L — ABNORMAL LOW (ref 15–41)
Albumin: 3.4 g/dL — ABNORMAL LOW (ref 3.5–5.0)
Alkaline Phosphatase: 80 U/L (ref 38–126)
Anion gap: 8 (ref 5–15)
BUN: 7 mg/dL (ref 6–20)
CO2: 21 mmol/L — ABNORMAL LOW (ref 22–32)
Calcium: 8.4 mg/dL — ABNORMAL LOW (ref 8.9–10.3)
Chloride: 97 mmol/L — ABNORMAL LOW (ref 98–111)
Creatinine, Ser: 0.49 mg/dL — ABNORMAL LOW (ref 0.61–1.24)
GFR, Estimated: >60 mL/min (ref >60)
Glucose, Bld: 274 mg/dL — ABNORMAL HIGH (ref 70–99)
Potassium: 3.6 mmol/L (ref 3.5–5.1)
Sodium: 126 mmol/L — ABNORMAL LOW (ref 135–145)
Total Bilirubin: 0.4 mg/dL (ref 0.3–1.2)
Total Protein: 7.7 g/dL (ref 6.5–8.1)

## 2020-08-18 LAB — ETHANOL: Alcohol, Ethyl (B): 290 mg/dL — ABNORMAL HIGH (ref <10)

## 2020-08-18 MED ORDER — SODIUM CHLORIDE 0.9 % IV BOLUS
1000.0000 mL | Freq: Once | INTRAVENOUS | Status: AC
  Administered 2020-08-18: 1000 mL via INTRAVENOUS

## 2020-08-18 MED ORDER — IOHEXOL 300 MG/ML  SOLN
100.0000 mL | Freq: Once | INTRAMUSCULAR | Status: AC | PRN
  Administered 2020-08-18: 100 mL via INTRAVENOUS

## 2020-08-18 NOTE — ED Provider Notes (Signed)
[facility]-EMER[facility]Y DEPT [facility] Emergency Department Provider Note MRN:  [id number]  Arrival date & time: 08/18/20     Chief Complaint   Fall   History of Present Illness   [name] is a 55 y.o. year-old male with a history of diabetes, COPD, CAD presenting to the ED with chief complaint of fall.  Alcohol use this evening, fell out of chair backwards and struck back of head.  Endorsing loss of consciousness, headache, neck pain, back pain, abdominal pain.  Denies chest pain.  Pain is moderate to severe, constant, worse with motion and palpation.  Denies vomiting.  Review of Systems  A complete 10 system review of systems was obtained and all systems are negative except as noted in the HPI and PMH.   Patient's Health History    Past Medical History:  Diagnosis Date  . Abscess of right foot   . ADHD   . Anemia   . Anxiety   . Arthritis   . Asthma   . COPD (chronic obstructive pulmonary disease) (HCC)   . Diabetes mellitus   . Diabetic foot ulcer (HCC) 07/09/2017  . 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 Foot osteomyelitis, left (HCC)   . GERD (gastroesophageal reflux disease)   . Gout   . History of blood transfusion   . Hypertension   . Myocardial infarction [facility])    ? 2008,  states he did not have a cath done and never saw a cardiologist  . Neuropathy   . Pancreatitis   . Stroke [facility])    TIA- no residual  . Wears glasses     Past Surgical History:  Procedure Laterality Date  . AMPUTATION Left 11/09/2018   Procedure: LEFT TRANSMETATARSAL AMPUTATION;  Surgeon: Nadara Mustard, MD;  Location: [facility] OR;  Service: Orthopedics;  Laterality: Left;  . AMPUTATION Left 12/13/2019   Procedure: LEFT BELOW KNEE AMPUTATION;  Surgeon: Nadara Mustard, MD;  Location: [facility] OR;  Service: Orthopedics;  Laterality: Left;  . AMPUTATION Right 05/15/2020   Procedure: RIGHT TRANSMETATARSAL AMPUTATION;  Surgeon: Nadara Mustard, MD;  Location: [facility] OR;  Service: Orthopedics;  Laterality: Right;  . AMPUTATION Right 07/03/2020   Procedure: RIGHT BELOW KNEE AMPUTATION;  Surgeon: Nadara Mustard, MD;  Location: [facility] OR;  Service: Orthopedics;  Laterality: Right;  Right below knee amputation  . APPENDECTOMY    . BELOW KNEE LEG AMPUTATION Right 07/03/2020  . FOOT SURGERY Left   . LAPAROSCOPIC APPENDECTOMY  07/28/2011   Procedure: APPENDECTOMY LAPAROSCOPIC;  Surgeon: Fabio Bering, MD;  Location: [facility] ORS;  Service: General;  Laterality: N/A;  . LEG SURGERY      History reviewed. No pertinent family history.  Social History   Socioeconomic History  . Marital status: Legally Separated    Spouse name: Not on file  . Number of children: Not on file  . Years of education: Not on file  . Highest education level: Not on file  Occupational History  . Not on file  Tobacco Use  . Smoking status: Former Smoker    Types: Cigarettes  . Smokeless tobacco: Never Used  . Tobacco comment: quit smoking cigarettes 27 years ago 09/05/19  Vaping Use  . Vaping Use: Never used  Substance and Sexual Activity  . Alcohol use: Yes    Comment: occasionally  . Drug use: Not Currently    Types: Marijuana  . Sexual activity: Not  on file  Other Topics Concern  . Not on file  Social History Narrative  . Not on file   Social Determinants of Health   Financial Resource Strain: Not on file  Food Insecurity: Not on file  Transportation Needs: Not on file  Physical Activity: Not on file  Stress: Not on file  Social Connections: Not on file  Intimate Partner Violence: Not on file     Physical Exam   Vitals:   08/18/20 0413 08/18/20 0500  BP: 90/72 131/75  Pulse: 96 92  Resp: 19 20  Temp:    SpO2: 96% 96%    CONSTITUTIONAL: Chronically ill-appearing, NAD NEURO:  Alert and oriented x 3, no focal deficits, mildly intoxicated EYES:  eyes equal and reactive ENT/NECK:  no LAD, no JVD CARDIO: Regular rate, well-perfused, normal S1 and  S2 PULM:  CTAB no wheezing or rhonchi GI/GU:  normal bowel sounds, non-distended, diffuse abdominal tenderness MSK/SPINE: Left leg prosthetic, right leg below the knee amputation with splint in place; tender to palpation to the C, T, L spine SKIN:  no rash, atraumatic PSYCH:  Appropriate speech and behavior  *Additional and/or pertinent findings included in MDM below  Diagnostic and Interventional Summary    EKG Interpretation  Date/Time:  Sunday Aug 18 2020 01:24:41 EDT Ventricular Rate:  82 PR Interval:  190 QRS Duration: 105 QT Interval:  392 QTC Calculation: 458 R Axis:   35 Text Interpretation: Sinus rhythm RSR' in V1 or V2, right VCD or RVH Confirmed by Kennis Carina [phone]) on 08/18/2020 3:33:33 AM      Labs Reviewed  CBC - Abnormal; Notable for the following components:      Result Value   RBC 3.90 (*)    Hemoglobin 9.5 (*)    HCT 30.6 (*)    MCV 78.5 (*)    MCH 24.4 (*)    All other components within normal limits  COMPREHENSIVE METABOLIC PANEL - Abnormal; Notable for the following components:   Sodium 126 (*)    Chloride 97 (*)    CO2 21 (*)    Glucose, Bld 274 (*)    Creatinine, Ser 0.49 (*)    Calcium 8.4 (*)    Albumin 3.4 (*)    AST 14 (*)    All other components within normal limits  ETHANOL - Abnormal; Notable for the following components:   Alcohol, Ethyl (B) 290 (*)    All other components within normal limits    CT ABDOMEN PELVIS W CONTRAST  Final Result    CT HEAD WO CONTRAST  Final Result    CT CERVICAL SPINE WO CONTRAST  Final Result    DG Thoracic Spine 2 View  Final Result    DG Chest Port 1 View  Final Result      Medications  iohexol (OMNIPAQUE) 300 MG/ML solution 100 mL (100 mLs Intravenous Contrast Given 08/18/20 0304)  sodium chloride 0.9 % bolus 1,000 mL (0 mLs Intravenous Stopped 08/18/20 0503)     Procedures  /  Critical Care Procedures  ED Course and Medical Decision Making  I have reviewed the triage vital signs, the  nursing notes, and pertinent available records from the EMR.  Listed above are laboratory and imaging tests that I personally ordered, reviewed, and interpreted and then considered in my medical decision making (see below for details).  Fall in the setting of alcohol, will need CT imaging to exclude significant traumatic injury     Work-up is reassuring, no  intracranial bleeding, no cervical spinal injury, no intra-abdominal trauma.  Patient has clinically become more sober, sitting in bed, urinating on his own, appropriate for discharge.  Elmer Sow. Pilar Plate, MD [facility] Emergency Medicine [facility] [email or web address]  Final Clinical Impressions(s) / ED Diagnoses     ICD-10-CM   1. Fall, initial encounter  W19.XXXA   2. Injury of head, initial encounter  S09.90XA   3. Alcoholic intoxication with complication (HCC)  F10.929   4. Generalized abdominal tenderness, rebound tenderness presence not specified  R10.817     ED Discharge Orders    None       Discharge Instructions Discussed with and Provided to Patient:     Discharge Instructions     You were evaluated in the Emergency Department and after careful evaluation, we did not find any emergent condition requiring admission or further testing in the hospital.  Your exam/testing today was overall reassuring.  Please return to the Emergency Department if you experience any worsening of your condition.  Thank you for allowing [place] to be a part of your care.        Sabas Sous, MD 08/18/20 [phone]

## 2020-08-18 NOTE — Discharge Instructions (Addendum)
You were evaluated in the Emergency Department and after careful evaluation, we did not find any emergent condition requiring admission or further testing in the hospital.  Your exam/testing today was overall reassuring.  Please return to the Emergency Department if you experience any worsening of your condition.  Thank you for allowing us to be a part of your care.  

## 2020-08-18 NOTE — ED Notes (Signed)
Pt tossing in bed. Upon nurse into room to adjust blood pressure cuff patient IV was found to be removed by pt during his sleep. Per Dr. Pilar Plate no IV is needed at this time.

## 2020-08-18 NOTE — ED Notes (Signed)
Pt is calling for a ride at this time. Pt will stay in room until transport arrives for safety reasons.

## 2020-08-18 NOTE — ED Triage Notes (Addendum)
Pt brought in by [facility] after he fell in his carport tonight. Pt states he fell out of his chair and hit his head on the concrete. States he passed out and woke up to his wife. Pt with c/o neck, back, and abdominal pain. Pt states he "doesn't feel right, that his head feels swimmy". Pt with c-collar in place. Per EMS, pt with " a lot of ETOH on board". Blood glucose obtained by EMS was 303. EMS states pt does not like to take his insulin because "it makes him itch".

## 2020-08-23 ENCOUNTER — Telehealth: Payer: Self-pay | Admitting: Orthopedic Surgery

## 2020-08-23 NOTE — Telephone Encounter (Signed)
Randi from [facility] [employer] called. Patient is requesting a RX for a Rollator walker to be faxed to [phone]. Randi's call back number is [phone]

## 2020-08-26 ENCOUNTER — Other Ambulatory Visit: Payer: Self-pay

## 2020-08-26 NOTE — Telephone Encounter (Signed)
Order was faxed per request to the number provided.

## 2020-10-08 ENCOUNTER — Encounter: Payer: Self-pay | Admitting: Physician Assistant

## 2020-10-08 ENCOUNTER — Other Ambulatory Visit: Payer: Self-pay

## 2020-10-08 ENCOUNTER — Ambulatory Visit (INDEPENDENT_AMBULATORY_CARE_PROVIDER_SITE_OTHER): Payer: Medicaid Other | Admitting: Physician Assistant

## 2020-10-08 DIAGNOSIS — Z89511 Acquired absence of right leg below knee: Secondary | ICD-10-CM

## 2020-10-08 DIAGNOSIS — Z89512 Acquired absence of left leg below knee: Secondary | ICD-10-CM

## 2020-10-08 NOTE — Progress Notes (Signed)
Office Visit Note   Patient: [name]           Date of Birth: 04/02/1966           MRN: [id number] Visit Date: 10/08/2020              Requested by: Alvina Filbert, MD [address] [place],  [place] [address] PCP: Alvina Filbert, MD  Chief Complaint  Patient presents with   Right Leg - Routine Post Op    07/03/20 right BKA       HPI: Patient presents today for follow-up status post right below-knee amputation.  He is also status post left below-knee amputation.  He is doing extremely well and expects to get his prosthetic in 2 weeks  Assessment & Plan: Visit Diagnoses:  1. S/P bilateral BKA (below knee amputation) (HCC)     Plan: Order will be placed for physical therapy.  He will follow-up for final visit in 2 months  Follow-Up Instructions: No follow-ups on file.   Ortho Exam  Patient is alert, oriented, no adenopathy, well-dressed, normal affect, normal respiratory effort. Examination well-healed surgical incision.  No eschar along the incision line.  He has a small resolving scab on his tibial tubercle from where he thinks he bumped it.  There is no surrounding erythema no ascending cellulitis swelling overall is well controlled.  He is wearing to shrinkers.  Imaging: No results found. No images are attached to the encounter.  Labs: Lab Results  Component Value Date   HGBA1C 9.7 (H) 05/15/2020   HGBA1C 11.4 (H) 12/13/2019   HGBA1C 9.7 (H) 11/09/2018   REPTSTATUS 07/14/2017 FINAL 07/09/2017   REPTSTATUS 07/14/2017 FINAL 07/09/2017   CULT  07/09/2017    NO GROWTH 5 DAYS Performed at [facility], [address] [place], [place] [address]    CULT  07/09/2017    NO GROWTH 5 DAYS Performed at [facility], [address] [place], [place] [address]      Lab Results  Component Value Date   ALBUMIN 3.4 (L) 08/18/2020   ALBUMIN 3.1 (L) 07/03/2020   ALBUMIN 2.9 (L) 05/14/2020    Lab Results  Component Value Date   MG 1.5 (L) 05/17/2020   MG  1.5 (L) 05/16/2020   MG 1.4 (L) 07/11/2017   No results found for: VD25OH  No results found for: PREALBUMIN CBC EXTENDED Latest Ref Rng & Units 08/18/2020 07/03/2020 05/17/2020  WBC 4.0 - 10.5 K/uL 7.9 6.2 5.9  RBC 4.22 - 5.81 MIL/uL 3.90(L) 3.91(L) 3.18(L)  HGB 13.0 - 17.0 g/dL 4.1(L) 2.4(M) 0.1(U)  HCT 39.0 - 52.0 % 30.6(L) 32.4(L) 27.1(L)  PLT 150 - 400 K/uL 223 244 273  NEUTROABS 1.7 - 7.7 K/uL - - -  LYMPHSABS 0.7 - 4.0 K/uL - - -     There is no height or weight on file to calculate BMI.  Orders:  Orders Placed This Encounter  Procedures   Ambulatory referral to Physical Therapy   No orders of the defined types were placed in this encounter.    Procedures: No procedures performed  Clinical Data: No additional findings.  ROS:  All other systems negative, except as noted in the HPI. Review of Systems  Objective: Vital Signs: There were no vitals taken for this visit.  Specialty Comments:  No specialty comments available.  PMFS History: Patient Active Problem List   Diagnosis Date Noted   Acute hematogenous osteomyelitis of right foot (HCC) 07/03/2020   Subacute  osteomyelitis, right ankle and foot (HCC)    Foot ulcer (HCC) 05/15/2020   Sepsis (HCC) 05/15/2020   COPD (chronic obstructive pulmonary disease) (HCC) 05/15/2020   Diabetic foot infection (HCC)    Acquired absence of left leg below knee (HCC) 12/29/2019   Abscess of left foot 12/13/2019   Diabetes mellitus type II, non insulin dependent (HCC) 07/09/2017   Subacute osteomyelitis, left ankle and foot (HCC) 07/09/2017   Hypokalemia 07/09/2017   Hyponatremia 07/09/2017   AKI (acute kidney injury) (HCC) 07/09/2017   Symptomatic anemia 07/09/2017   Essential hypertension 07/09/2017   Depression with anxiety 07/09/2017   Venous thrombosis 07/09/2017   Past Medical History:  Diagnosis Date   Abscess of right foot    ADHD    Anemia    Anxiety    Arthritis    Asthma    COPD (chronic obstructive  pulmonary disease) (HCC)    Diabetes mellitus    Diabetic foot ulcer (HCC) 07/09/2017   Diabetic ulcer of back associated with type 2 diabetes mellitus, limited to breakdown of skin (HCC)    Foot osteomyelitis, left (HCC)    GERD (gastroesophageal reflux disease)    Gout    History of blood transfusion    Hypertension    Myocardial infarction (HCC)    ? 2008,  states he did not have a cath done and never saw a cardiologist   Neuropathy    Pancreatitis    Stroke [facility])    TIA- no residual   Wears glasses     No family history on file.  Past Surgical History:  Procedure Laterality Date   AMPUTATION Left 11/09/2018   Procedure: LEFT TRANSMETATARSAL AMPUTATION;  Surgeon: Nadara Mustard, MD;  Location: [facility] OR;  Service: Orthopedics;  Laterality: Left;   AMPUTATION Left 12/13/2019   Procedure: LEFT BELOW KNEE AMPUTATION;  Surgeon: Nadara Mustard, MD;  Location: [facility] OR;  Service: Orthopedics;  Laterality: Left;   AMPUTATION Right 05/15/2020   Procedure: RIGHT TRANSMETATARSAL AMPUTATION;  Surgeon: Nadara Mustard, MD;  Location: [facility] OR;  Service: Orthopedics;  Laterality: Right;   AMPUTATION Right 07/03/2020   Procedure: RIGHT BELOW KNEE AMPUTATION;  Surgeon: Nadara Mustard, MD;  Location: [facility] OR;  Service: Orthopedics;  Laterality: Right;  Right below knee amputation   APPENDECTOMY     BELOW KNEE LEG AMPUTATION Right 07/03/2020   FOOT SURGERY Left    LAPAROSCOPIC APPENDECTOMY  07/28/2011   Procedure: APPENDECTOMY LAPAROSCOPIC;  Surgeon: Fabio Bering, MD;  Location: [facility];  Service: General;  Laterality: N/A;   LEG SURGERY     Social History   Occupational History   Not on file  Tobacco Use   Smoking status: Former    Pack years: 0.00    Types: Cigarettes   Smokeless tobacco: Never   Tobacco comments:    quit smoking cigarettes 27 years ago 09/05/19  Vaping Use   Vaping Use: Never used  Substance and Sexual Activity   Alcohol use: Yes    Comment: occasionally   Drug use: Not  Currently    Types: Marijuana   Sexual activity: Not on file

## 2020-10-28 ENCOUNTER — Ambulatory Visit: Payer: Medicaid Other

## 2020-12-09 ENCOUNTER — Encounter: Payer: Self-pay | Admitting: Orthopedic Surgery

## 2020-12-09 ENCOUNTER — Ambulatory Visit (INDEPENDENT_AMBULATORY_CARE_PROVIDER_SITE_OTHER): Payer: Medicaid Other | Admitting: Orthopedic Surgery

## 2020-12-09 DIAGNOSIS — Z89511 Acquired absence of right leg below knee: Secondary | ICD-10-CM | POA: Diagnosis not present

## 2020-12-09 DIAGNOSIS — Z89512 Acquired absence of left leg below knee: Secondary | ICD-10-CM

## 2020-12-09 NOTE — Progress Notes (Signed)
Office Visit Note   Patient: [name]           Date of Birth: [birth year]           MRN: [id number] Visit Date: 12/09/2020              Requested by: Alvina Filbert, MD [address] [place],  [place] [address] PCP: Alvina Filbert, MD  Chief Complaint  Patient presents with   Right Leg - Follow-up    07/03/20 right BKA    Left Leg - Follow-up    12/13/19 left BKA       HPI: Patient is a 55 year old gentleman who is seen in follow-up for bilateral transtibial amputations he has prosthesis from [employer] he is quite pleased with his progress with ADLs.  He states he has no complaints.  Assessment & Plan: Visit Diagnoses:  1. S/P bilateral BKA (below knee amputation) (HCC)     Plan: Patient will continue to to increase activities as tolerated.  Patient will call us if he develops any blister or callus or ulcer.  He will call if there is any failure of the prosthesis.  Follow-Up Instructions: Return if symptoms worsen or fail to improve.   Ortho Exam  Patient is alert, oriented, no adenopathy, well-dressed, normal affect, normal respiratory effort. Examination patient has full range of motion of both knees the residual limbs are well-healed there is no redness no cellulitis no ulcer no callus no signs of any fitting problems with the prosthesis.  Imaging: No results found. No images are attached to the encounter.  Labs: Lab Results  Component Value Date   HGBA1C 9.7 (H) 05/15/2020   HGBA1C 11.4 (H) 12/13/2019   HGBA1C 9.7 (H) 11/09/2018   REPTSTATUS 07/14/2017 FINAL 07/09/2017   REPTSTATUS 07/14/2017 FINAL 07/09/2017   CULT  07/09/2017    NO GROWTH 5 DAYS Performed at [facility], [address] [place], [place] [address]    CULT  07/09/2017    NO GROWTH 5 DAYS Performed at [facility], [address], [place], [place] [address]      Lab Results  Component Value Date   ALBUMIN 3.4 (L) 08/18/2020   ALBUMIN 3.1 (L) 07/03/2020   ALBUMIN 2.9 (L)  05/14/2020    Lab Results  Component Value Date   MG 1.5 (L) 05/17/2020   MG 1.5 (L) 05/16/2020   MG 1.4 (L) 07/11/2017   No results found for: VD25OH  No results found for: PREALBUMIN CBC EXTENDED Latest Ref Rng & Units 08/18/2020 07/03/2020 05/17/2020  WBC 4.0 - 10.5 K/uL 7.9 6.2 5.9  RBC 4.22 - 5.81 MIL/uL 3.90(L) 3.91(L) 3.18(L)  HGB 13.0 - 17.0 g/dL 1.0(U) 7.2(Z) 3.6(U)  HCT 39.0 - 52.0 % 30.6(L) 32.4(L) 27.1(L)  PLT 150 - 400 K/uL 223 244 273  NEUTROABS 1.7 - 7.7 K/uL - - -  LYMPHSABS 0.7 - 4.0 K/uL - - -     There is no height or weight on file to calculate BMI.  Orders:  No orders of the defined types were placed in this encounter.  No orders of the defined types were placed in this encounter.    Procedures: No procedures performed  Clinical Data: No additional findings.  ROS:  All other systems negative, except as noted in the HPI. Review of Systems  Objective: Vital Signs: There were no vitals taken for this visit.  Specialty Comments:  No specialty comments available.  PMFS History: Patient Active Problem List  Diagnosis Date Noted   Acute hematogenous osteomyelitis of right foot (HCC) 07/03/2020   Subacute osteomyelitis, right ankle and foot (HCC)    Foot ulcer (HCC) 05/15/2020   Sepsis (HCC) 05/15/2020   COPD (chronic obstructive pulmonary disease) (HCC) 05/15/2020   Diabetic foot infection (HCC)    Acquired absence of left leg below knee (HCC) 12/29/2019   Abscess of left foot 12/13/2019   Diabetes mellitus type II, non insulin dependent (HCC) 07/09/2017   Subacute osteomyelitis, left ankle and foot (HCC) 07/09/2017   Hypokalemia 07/09/2017   Hyponatremia 07/09/2017   AKI (acute kidney injury) (HCC) 07/09/2017   Symptomatic anemia 07/09/2017   Essential hypertension 07/09/2017   Depression with anxiety 07/09/2017   Venous thrombosis 07/09/2017   Past Medical History:  Diagnosis Date   Abscess of right foot    ADHD    Anemia     Anxiety    Arthritis    Asthma    COPD (chronic obstructive pulmonary disease) (HCC)    Diabetes mellitus    Diabetic foot ulcer (HCC) 07/09/2017   Diabetic ulcer of back associated with type 2 diabetes mellitus, limited to breakdown of skin (HCC)    Foot osteomyelitis, left (HCC)    GERD (gastroesophageal reflux disease)    Gout    History of blood transfusion    Hypertension    Myocardial infarction (HCC)    ? 2008,  states he did not have a cath done and never saw a cardiologist   Neuropathy    Pancreatitis    Stroke [facility])    TIA- no residual   Wears glasses     History reviewed. No pertinent family history.  Past Surgical History:  Procedure Laterality Date   AMPUTATION Left 11/09/2018   Procedure: LEFT TRANSMETATARSAL AMPUTATION;  Surgeon: Nadara Mustard, MD;  Location: [facility] OR;  Service: Orthopedics;  Laterality: Left;   AMPUTATION Left 12/13/2019   Procedure: LEFT BELOW KNEE AMPUTATION;  Surgeon: Nadara Mustard, MD;  Location: [facility] OR;  Service: Orthopedics;  Laterality: Left;   AMPUTATION Right 05/15/2020   Procedure: RIGHT TRANSMETATARSAL AMPUTATION;  Surgeon: Nadara Mustard, MD;  Location: [facility] OR;  Service: Orthopedics;  Laterality: Right;   AMPUTATION Right 07/03/2020   Procedure: RIGHT BELOW KNEE AMPUTATION;  Surgeon: Nadara Mustard, MD;  Location: [facility] OR;  Service: Orthopedics;  Laterality: Right;  Right below knee amputation   APPENDECTOMY     BELOW KNEE LEG AMPUTATION Right 07/03/2020   FOOT SURGERY Left    LAPAROSCOPIC APPENDECTOMY  07/28/2011   Procedure: APPENDECTOMY LAPAROSCOPIC;  Surgeon: Fabio Bering, MD;  Location: [facility];  Service: General;  Laterality: N/A;   LEG SURGERY     Social History   Occupational History   Not on file  Tobacco Use   Smoking status: Former    Types: Cigarettes   Smokeless tobacco: Never   Tobacco comments:    quit smoking cigarettes 27 years ago 09/05/19  Vaping Use   Vaping Use: Never used  Substance and Sexual Activity    Alcohol use: Yes    Comment: occasionally   Drug use: Not Currently    Types: Marijuana   Sexual activity: Not on file

## 2021-01-27 ENCOUNTER — Ambulatory Visit: Payer: Medicaid Other | Admitting: Orthopedic Surgery

## 2021-01-29 ENCOUNTER — Encounter: Payer: Self-pay | Admitting: Family

## 2021-01-29 ENCOUNTER — Ambulatory Visit: Payer: Self-pay

## 2021-01-29 ENCOUNTER — Other Ambulatory Visit: Payer: Self-pay

## 2021-01-29 ENCOUNTER — Ambulatory Visit: Payer: Medicaid Other | Admitting: Family

## 2021-01-29 ENCOUNTER — Ambulatory Visit (INDEPENDENT_AMBULATORY_CARE_PROVIDER_SITE_OTHER): Payer: Medicaid Other | Admitting: Family

## 2021-01-29 DIAGNOSIS — M546 Pain in thoracic spine: Secondary | ICD-10-CM

## 2021-01-29 DIAGNOSIS — M545 Low back pain, unspecified: Secondary | ICD-10-CM | POA: Diagnosis not present

## 2021-01-29 DIAGNOSIS — G8929 Other chronic pain: Secondary | ICD-10-CM

## 2021-01-29 NOTE — Progress Notes (Signed)
Office Visit Note   Patient: William [name]           Date of Birth: 05-Oct-1965           MRN: [id number] Visit Date: 01/29/2021              Requested by: Alvina Filbert, MD [address] [place],  [place] [address] PCP: Alvina Filbert, MD  Chief Complaint  Patient presents with   Lower Back - Pain      HPI: The patient is a 55 year old gentleman seen today for evaluation of low back pain.  This is been ongoing for years.  Has worsened since he has become much more mobile.  He is status post recent below-knee amputation and is now ambulatory and bilateral below-knee prostheses.  He states he cannot stand for greater than 10 minutes due to back pain.  Complaining of sharp and shooting pain in his low back that shoots up his spine he does not associate this with any numbness tingling or weakness no red flag symptoms.  No recent [name]  Patient does not feel that his prostheses are uneven that he is standing on even in them  The patient is requesting a cortisone shot to assist with his back pain  Assessment & Plan: Visit Diagnoses:  1. Chronic midline low back pain without sciatica   2. Lower thoracic back pain     Plan:   Follow-Up Instructions: Return if symptoms worsen or fail to improve.   Back Exam   Tenderness  The patient is experiencing tenderness in the lumbar.  Range of Motion  The patient has normal back ROM.  Muscle Strength  The patient has normal back strength.  Other  Gait: abnormal      Patient is alert, oriented, no adenopathy, well-dressed, normal affect, normal respiratory effort. Negative straight leg raise bilaterally  Imaging: XR Lumbar Spine 2-3 Views  Result Date: 01/29/2021 Radiographs of lumbar spine show mild to moderate spondylosis without spondylolisthesis. No acute finding.   No images are attached to the encounter.  Labs: Lab Results  Component Value Date   HGBA1C 9.7 (H) 05/15/2020   HGBA1C 11.4 (H) 12/13/2019    HGBA1C 9.7 (H) 11/09/2018   REPTSTATUS 07/14/2017 FINAL 07/09/2017   REPTSTATUS 07/14/2017 FINAL 07/09/2017   CULT  07/09/2017    NO GROWTH 5 DAYS Performed at [facility], [address], [place], [place] [address]    CULT  07/09/2017    NO GROWTH 5 DAYS Performed at [facility], [address] [place], [place] [address]      Lab Results  Component Value Date   ALBUMIN 3.4 (L) 08/18/2020   ALBUMIN 3.1 (L) 07/03/2020   ALBUMIN 2.9 (L) 05/14/2020    Lab Results  Component Value Date   MG 1.5 (L) 05/17/2020   MG 1.5 (L) 05/16/2020   MG 1.4 (L) 07/11/2017   No results found for: VD25OH  No results found for: PREALBUMIN CBC EXTENDED Latest Ref Rng & Units 08/18/2020 07/03/2020 05/17/2020  WBC 4.0 - 10.5 K/uL 7.9 6.2 5.9  RBC 4.22 - 5.81 MIL/uL 3.90(L) 3.91(L) 3.18(L)  HGB 13.0 - 17.0 g/dL 3.5(T) 6.1(W) 4.3(X)  HCT 39.0 - 52.0 % 30.6(L) 32.4(L) 27.1(L)  PLT 150 - 400 K/uL 223 244 273  NEUTROABS 1.7 - 7.7 K/uL - - -  LYMPHSABS 0.7 - 4.0 K/uL - - -     There is no height or weight on file to calculate BMI.  Orders:  Orders Placed This Encounter  Procedures   XR Lumbar Spine 2-3 Views   No orders of the defined types were placed in this encounter.    Procedures: No procedures performed  Clinical Data: No additional findings.  ROS:  All other systems negative, except as noted in the HPI. Review of Systems  Musculoskeletal:  Positive for back pain. Negative for myalgias.   Objective: Vital Signs: There were no vitals taken for this visit.  Specialty Comments:  No specialty comments available.  PMFS History: Patient Active Problem List   Diagnosis Date Noted   Acute hematogenous osteomyelitis of right foot (HCC) 07/03/2020   Subacute osteomyelitis, right ankle and foot (HCC)    Foot ulcer (HCC) 05/15/2020   Sepsis (HCC) 05/15/2020   COPD (chronic obstructive pulmonary disease) (HCC) 05/15/2020   Diabetic foot infection (HCC)    Acquired absence of left  leg below knee (HCC) 12/29/2019   Abscess of left foot 12/13/2019   Diabetes mellitus type II, non insulin dependent (HCC) 07/09/2017   Subacute osteomyelitis, left ankle and foot (HCC) 07/09/2017   Hypokalemia 07/09/2017   Hyponatremia 07/09/2017   AKI (acute kidney injury) (HCC) 07/09/2017   Symptomatic anemia 07/09/2017   Essential hypertension 07/09/2017   Depression with anxiety 07/09/2017   Venous thrombosis 07/09/2017   Past Medical History:  Diagnosis Date   Abscess of right foot    ADHD    Anemia    Anxiety    Arthritis    Asthma    COPD (chronic obstructive pulmonary disease) (HCC)    Diabetes mellitus    Diabetic foot ulcer (HCC) 07/09/2017   Diabetic ulcer of back associated with type 2 diabetes mellitus, limited to breakdown of skin (HCC)    Foot osteomyelitis, left (HCC)    GERD (gastroesophageal reflux disease)    Gout    History of blood transfusion    Hypertension    Myocardial infarction (HCC)    ? 2008,  states he did not have a cath done and never saw a cardiologist   Neuropathy    Pancreatitis    Stroke [facility])    TIA- no residual   Wears glasses     History reviewed. No pertinent family history.  Past Surgical History:  Procedure Laterality Date   AMPUTATION Left 11/09/2018   Procedure: LEFT TRANSMETATARSAL AMPUTATION;  Surgeon: Nadara Mustard, MD;  Location: [facility] OR;  Service: Orthopedics;  Laterality: Left;   AMPUTATION Left 12/13/2019   Procedure: LEFT BELOW KNEE AMPUTATION;  Surgeon: Nadara Mustard, MD;  Location: [place] [facility] OR;  Service: Orthopedics;  Laterality: Left;   AMPUTATION Right 05/15/2020   Procedure: RIGHT TRANSMETATARSAL AMPUTATION;  Surgeon: Nadara Mustard, MD;  Location: [facility] OR;  Service: Orthopedics;  Laterality: Right;   AMPUTATION Right 07/03/2020   Procedure: RIGHT BELOW KNEE AMPUTATION;  Surgeon: Nadara Mustard, MD;  Location: [facility] OR;  Service: Orthopedics;  Laterality: Right;  Right below knee amputation   APPENDECTOMY     BELOW KNEE LEG  AMPUTATION Right 07/03/2020   FOOT SURGERY Left    LAPAROSCOPIC APPENDECTOMY  07/28/2011   Procedure: APPENDECTOMY LAPAROSCOPIC;  Surgeon: Fabio Bering, MD;  Location: [facility];  Service: General;  Laterality: N/A;   LEG SURGERY     Social History   Occupational History   Not on file  Tobacco Use   Smoking status: Former    Types: Cigarettes   Smokeless tobacco: Never   Tobacco comments:    quit smoking cigarettes 27  years ago 09/05/19  Vaping Use   Vaping Use: Never used  Substance and Sexual Activity   Alcohol use: Yes    Comment: occasionally   Drug use: Not Currently    Types: Marijuana   Sexual activity: Not on file

## 2021-02-13 ENCOUNTER — Telehealth: Payer: Self-pay

## 2021-02-13 NOTE — Telephone Encounter (Signed)
Called pt and he states that his left knee is hurting and the he is having difficulty walking. Wants to see Dr. Lajoyce Corners only. Offered appt today and the pt was not able to come due to transportation. Offered next appt on Monday 3pm will see pt then

## 2021-02-13 NOTE — Telephone Encounter (Signed)
Patient left a VM asking for a call back.  Did not state what he needed.  Cb# [phone].  Please advise.  Thank you.

## 2021-02-17 ENCOUNTER — Ambulatory Visit (INDEPENDENT_AMBULATORY_CARE_PROVIDER_SITE_OTHER): Payer: Medicaid Other | Admitting: Orthopedic Surgery

## 2021-02-17 ENCOUNTER — Other Ambulatory Visit: Payer: Self-pay

## 2021-02-17 DIAGNOSIS — M79609 Pain in unspecified limb: Secondary | ICD-10-CM | POA: Diagnosis not present

## 2021-02-17 DIAGNOSIS — T8789 Other complications of amputation stump: Secondary | ICD-10-CM | POA: Diagnosis not present

## 2021-02-17 DIAGNOSIS — Z89512 Acquired absence of left leg below knee: Secondary | ICD-10-CM | POA: Diagnosis not present

## 2021-02-17 DIAGNOSIS — Z89511 Acquired absence of right leg below knee: Secondary | ICD-10-CM

## 2021-02-18 ENCOUNTER — Encounter: Payer: Self-pay | Admitting: Orthopedic Surgery

## 2021-02-18 NOTE — Progress Notes (Signed)
Office Visit Note   Patient: [name]           Date of Birth: 10-13-1965           MRN: [id number] Visit Date: 02/17/2021              Requested by: Alvina Filbert, MD [address] [place],  [place] [address] PCP: Alvina Filbert, MD  Chief Complaint  Patient presents with   Left Leg - Pain      HPI: Patient is a 55 year old gentleman who presents complaining of pain in his left residual limb.  He is status post bilateral below-knee amputations.  He complains of a knot on the lateral side of his knee as well as callus and pain over the end of the residual limb.  His socket is about 53 years old.  Assessment & Plan: Visit Diagnoses:  1. S/P bilateral BKA (below knee amputation) (HCC)   2. Amputation stump pain (HCC)     Plan: Patient was given a prosthetic under liner to help with the dermatitis.  He is given a new prescription for [employer] for a socket materials and supplies.  Follow-Up Instructions: Return if symptoms worsen or fail to improve.   Ortho Exam  Patient is alert, oriented, no adenopathy, well-dressed, normal affect, normal respiratory effort. Examination patient has pressure areas proximally at the knee.  There is redness but no open ulcers.  Patient has callus over the end of the residual limb and this is also tender to palpation there is no cellulitis.  Patient has been subsiding in his socket.  Patient also has a rash over the residual limb from sweating.  Patient is an existing left transtibial  amputee.  Patient's current comorbidities are not expected to impact the ability to function with the prescribed prosthesis. Patient verbally communicates a strong desire to use a prosthesis. Patient currently requires mobility aids to ambulate without a prosthesis.  Expects not to use mobility aids with a new prosthesis.  Patient is a K3 level ambulator that spends a lot of time walking around on uneven terrain over obstacles, up and down stairs, and  ambulates with a variable cadence.     Imaging: No results found. No images are attached to the encounter.  Labs: Lab Results  Component Value Date   HGBA1C 9.7 (H) 05/15/2020   HGBA1C 11.4 (H) 12/13/2019   HGBA1C 9.7 (H) 11/09/2018   REPTSTATUS 07/14/2017 FINAL 07/09/2017   REPTSTATUS 07/14/2017 FINAL 07/09/2017   CULT  07/09/2017    NO GROWTH 5 DAYS Performed at [facility], [address], [place], [place] [address]    CULT  07/09/2017    NO GROWTH 5 DAYS Performed at [facility], [address] [place], [place] [address]      Lab Results  Component Value Date   ALBUMIN 3.4 (L) 08/18/2020   ALBUMIN 3.1 (L) 07/03/2020   ALBUMIN 2.9 (L) 05/14/2020    Lab Results  Component Value Date   MG 1.5 (L) 05/17/2020   MG 1.5 (L) 05/16/2020   MG 1.4 (L) 07/11/2017   No results found for: VD25OH  No results found for: PREALBUMIN CBC EXTENDED Latest Ref Rng & Units 08/18/2020 07/03/2020 05/17/2020  WBC 4.0 - 10.5 K/uL 7.9 6.2 5.9  RBC 4.22 - 5.81 MIL/uL 3.90(L) 3.91(L) 3.18(L)  HGB 13.0 - 17.0 g/dL 6.8(T) 4.1(D) 6.2(I)  HCT 39.0 - 52.0 % 30.6(L) 32.4(L) 27.1(L)  PLT 150 - 400 K/uL 223 244 273  NEUTROABS 1.7 - 7.7 K/uL - - -  LYMPHSABS 0.7 - 4.0 K/uL - - -     There is no height or weight on file to calculate BMI.  Orders:  No orders of the defined types were placed in this encounter.  No orders of the defined types were placed in this encounter.    Procedures: No procedures performed  Clinical Data: No additional findings.  ROS:  All other systems negative, except as noted in the HPI. Review of Systems  Objective: Vital Signs: There were no vitals taken for this visit.  Specialty Comments:  No specialty comments available.  PMFS History: Patient Active Problem List   Diagnosis Date Noted   Acute hematogenous osteomyelitis of right foot (HCC) 07/03/2020   Subacute osteomyelitis, right ankle and foot (HCC)    Foot ulcer (HCC) 05/15/2020   Sepsis  (HCC) 05/15/2020   COPD (chronic obstructive pulmonary disease) (HCC) 05/15/2020   Diabetic foot infection (HCC)    Acquired absence of left leg below knee (HCC) 12/29/2019   Abscess of left foot 12/13/2019   Diabetes mellitus type II, non insulin dependent (HCC) 07/09/2017   Subacute osteomyelitis, left ankle and foot (HCC) 07/09/2017   Hypokalemia 07/09/2017   Hyponatremia 07/09/2017   AKI (acute kidney injury) (HCC) 07/09/2017   Symptomatic anemia 07/09/2017   Essential hypertension 07/09/2017   Depression with anxiety 07/09/2017   Venous thrombosis 07/09/2017   Past Medical History:  Diagnosis Date   Abscess of right foot    ADHD    Anemia    Anxiety    Arthritis    Asthma    COPD (chronic obstructive pulmonary disease) (HCC)    Diabetes mellitus    Diabetic foot ulcer (HCC) 07/09/2017   Diabetic ulcer of back associated with type 2 diabetes mellitus, limited to breakdown of skin (HCC)    Foot osteomyelitis, left (HCC)    GERD (gastroesophageal reflux disease)    Gout    History of blood transfusion    Hypertension    Myocardial infarction (HCC)    ? 2008,  states he did not have a cath done and never saw a cardiologist   Neuropathy    Pancreatitis    Stroke [facility])    TIA- no residual   Wears glasses     History reviewed. No pertinent family history.  Past Surgical History:  Procedure Laterality Date   AMPUTATION Left 11/09/2018   Procedure: LEFT TRANSMETATARSAL AMPUTATION;  Surgeon: Nadara Mustard, MD;  Location: [facility] OR;  Service: Orthopedics;  Laterality: Left;   AMPUTATION Left 12/13/2019   Procedure: LEFT BELOW KNEE AMPUTATION;  Surgeon: Nadara Mustard, MD;  Location: [facility] OR;  Service: Orthopedics;  Laterality: Left;   AMPUTATION Right 05/15/2020   Procedure: RIGHT TRANSMETATARSAL AMPUTATION;  Surgeon: Nadara Mustard, MD;  Location: [facility] OR;  Service: Orthopedics;  Laterality: Right;   AMPUTATION Right 07/03/2020   Procedure: RIGHT BELOW KNEE AMPUTATION;  Surgeon:  Nadara Mustard, MD;  Location: [facility] OR;  Service: Orthopedics;  Laterality: Right;  Right below knee amputation   APPENDECTOMY     BELOW KNEE LEG AMPUTATION Right 07/03/2020   FOOT SURGERY Left    LAPAROSCOPIC APPENDECTOMY  07/28/2011   Procedure: APPENDECTOMY LAPAROSCOPIC;  Surgeon: Fabio Bering, MD;  Location: [facility];  Service: General;  Laterality: N/A;   LEG SURGERY     Social History   Occupational History   Not on file  Tobacco Use   Smoking status: Former  Types: Cigarettes   Smokeless tobacco: Never   Tobacco comments:    quit smoking cigarettes 27 years ago 09/05/19  Vaping Use   Vaping Use: Never used  Substance and Sexual Activity   Alcohol use: Yes    Comment: occasionally   Drug use: Not Currently    Types: Marijuana   Sexual activity: Not on file

## 2021-04-01 ENCOUNTER — Telehealth: Payer: Self-pay | Admitting: Orthopedic Surgery

## 2021-04-01 NOTE — Telephone Encounter (Signed)
Received vm from [facility] w/ [facility] [employer]. Hey received the CMN for the wheelchair and needs last ov note. I faxed [phone]

## 2021-06-03 ENCOUNTER — Ambulatory Visit (INDEPENDENT_AMBULATORY_CARE_PROVIDER_SITE_OTHER): Payer: Medicaid Other

## 2021-06-03 ENCOUNTER — Other Ambulatory Visit: Payer: Self-pay

## 2021-06-03 ENCOUNTER — Encounter: Payer: Self-pay | Admitting: Orthopedic Surgery

## 2021-06-03 ENCOUNTER — Ambulatory Visit (INDEPENDENT_AMBULATORY_CARE_PROVIDER_SITE_OTHER): Payer: Medicaid Other | Admitting: Orthopedic Surgery

## 2021-06-03 VITALS — BP 138/81 | HR 85 | Ht 72.0 in | Wt 248.0 lb

## 2021-06-03 DIAGNOSIS — M542 Cervicalgia: Secondary | ICD-10-CM | POA: Diagnosis not present

## 2021-06-03 DIAGNOSIS — M79642 Pain in left hand: Secondary | ICD-10-CM

## 2021-06-03 NOTE — Progress Notes (Signed)
Office Visit Note   Patient: [name]           Date of Birth: Apr 26, 1965           MRN: [id number] Visit Date: 06/03/2021              Requested by: Abran Richard, MD [address] [place],  [place] [address] PCP: Abran Richard, MD   Assessment & Plan: Visit Diagnoses:  1. Neck pain   2. 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  Patient notes that he has a history of gout and needs to get flares in both of his feet.  He says that his wrist pain feels similar to that.  His symptoms are relatively mild today.  We discussed use of an oral NSAID.  He will also discuss gout treatment with his primary care provider when he sees them next in March.  Follow-Up Instructions: No follow-ups on file.   Orders:  Orders Placed This Encounter  Procedures   XR Hand Complete Left   XR Cervical Spine 2 or 3 views   No orders of the defined types were placed in this encounter.     Procedures: No procedures performed   Clinical Data: No additional findings.   Subjective: Chief Complaint  Patient presents with   Left Hand - New Patient (Initial Visit)    This is a 56 year old right-hand-dominant male who presents with chronic left wrist pain.  Is been going on for at least a year.  Patient denies any trauma to the wrist.  He notes that he develops atraumatic swelling of his wrist and hand with associated pain.  The pain radiates both distally into his hand and proximally up into his elbow.  He also describes neck and shoulder pain.  He is mildly symptomatic today.  He has occasional flares that resolved spontaneously.  He is notes that he had history of gout that he used to get in his feet prior to his amputations.  He says that this symptoms feel similar to his previous gout attacks.  He is not taking anything for gout or an anti-inflammatory currently.  He has been wearing  bilateral wrist braces.    Review of Systems   Objective: Vital Signs: BP 138/81 (BP Location: Right Arm, Patient Position: Sitting, Cuff Size: Large)    Pulse 85    Ht 6' (1.829 m)    Wt 248 lb (112.5 kg)    SpO2 98%    BMI 33.63 kg/m   Physical Exam Constitutional:      Appearance: Normal appearance.  Cardiovascular:     Rate and Rhythm: Normal rate.     Pulses: Normal pulses.  Pulmonary:     Effort: Pulmonary effort is normal.  Skin:    General: Skin is warm and dry.     Capillary Refill: Capillary refill takes less than 2 seconds.  Neurological:     Mental Status: He is alert.    Right Hand Exam   Tenderness  Right hand tenderness location: Mildly TTP at central portion of wrist w/ mild swelling.  Other  Erythema: absent Sensation: normal Pulse: present  Comments:  Able to make full composite fist with mild weakness.  Minimal pain w/ PROM of wrist.  No erythema.      Specialty Comments:  No specialty comments available.  Imaging: No  results found.   PMFS History: Patient Active Problem List   Diagnosis Date Noted   Acute hematogenous osteomyelitis of right foot ([place]) 07/03/2020   Subacute osteomyelitis, right ankle and foot (HCC)    Foot ulcer ([place]) 05/15/2020   Sepsis ([place]) 05/15/2020   COPD (chronic obstructive pulmonary disease) ([place]) 05/15/2020   Diabetic foot infection ([place])    Acquired absence of left leg below knee ([place]) 12/29/2019   Abscess of left foot 12/13/2019   Diabetes mellitus type II, non insulin dependent ([place]) 07/09/2017   Subacute osteomyelitis, left ankle and foot ([place]) 07/09/2017   Hypokalemia 07/09/2017   Hyponatremia 07/09/2017   AKI (acute kidney injury) ([place]) 07/09/2017   Symptomatic anemia 07/09/2017   Essential hypertension 07/09/2017   Depression with anxiety 07/09/2017   Venous thrombosis 07/09/2017   Past Medical History:  Diagnosis Date   Abscess of right foot    ADHD    Anemia    Anxiety    Arthritis     Asthma    COPD (chronic obstructive pulmonary disease) ([place])    Diabetes mellitus    Diabetic foot ulcer ([place]) 07/09/2017   Diabetic ulcer of back associated with type 2 diabetes mellitus, limited to breakdown of skin ([place])    Foot osteomyelitis, left ([place])    GERD (gastroesophageal reflux disease)    Gout    History of blood transfusion    Hypertension    Myocardial infarction ([place])    ? 2008,  states he did not have a cath done and never saw a cardiologist   Neuropathy    Pancreatitis    Stroke [facility])    TIA- no residual   Wears glasses     History reviewed. No pertinent family history.  Past Surgical History:  Procedure Laterality Date   AMPUTATION Left 11/09/2018   Procedure: LEFT TRANSMETATARSAL AMPUTATION;  Surgeon: Newt Minion, MD;  Location: [place];  Service: Orthopedics;  Laterality: Left;   AMPUTATION Left 12/13/2019   Procedure: LEFT BELOW KNEE AMPUTATION;  Surgeon: Newt Minion, MD;  Location: [place];  Service: Orthopedics;  Laterality: Left;   AMPUTATION Right 05/15/2020   Procedure: RIGHT TRANSMETATARSAL AMPUTATION;  Surgeon: Newt Minion, MD;  Location: [place];  Service: Orthopedics;  Laterality: Right;   AMPUTATION Right 07/03/2020   Procedure: RIGHT BELOW KNEE AMPUTATION;  Surgeon: Newt Minion, MD;  Location: [place];  Service: Orthopedics;  Laterality: Right;  Right below knee amputation   APPENDECTOMY     BELOW KNEE LEG AMPUTATION Right 07/03/2020   FOOT SURGERY Left    LAPAROSCOPIC APPENDECTOMY  07/28/2011   Procedure: APPENDECTOMY LAPAROSCOPIC;  Surgeon: Donato Heinz, MD;  Location: [facility];  Service: General;  Laterality: N/A;   LEG SURGERY     Social History   Occupational History   Not on file  Tobacco Use   Smoking status: Former    Types: Cigarettes   Smokeless tobacco: Never   Tobacco comments:    quit smoking cigarettes 27 years ago 09/05/19  Vaping Use   Vaping Use: Never used  Substance and Sexual Activity   Alcohol use: Yes    Comment:  occasionally   Drug use: Not Currently    Types: Marijuana   Sexual activity: Not on file

## 2021-11-24 ENCOUNTER — Ambulatory Visit: Payer: Medicaid Other | Admitting: Orthopedic Surgery

## 2021-11-27 ENCOUNTER — Ambulatory Visit: Payer: Medicaid Other | Admitting: Orthopedic Surgery

## 2021-11-28 ENCOUNTER — Ambulatory Visit: Payer: Medicaid Other | Admitting: Family

## 2022-02-25 IMAGING — CT CT CERVICAL SPINE W/O CM
3 of 4 series · 12 of 33 positions shown, 14 images · non-contrast
Comparison: None.

CLINICAL DATA: Status post fall.

EXAM:
CT CERVICAL SPINE WITHOUT CONTRAST
TECHNIQUE: Multidetector CT imaging of the cervical spine was performed without
intravenous contrast. Multiplanar CT image reconstructions were also
generated.

[Series 5: sagittal bone · sagittal · 0.24mm/px · 5 of 61 slices shown, 6 images]
[im 21/61  bone]
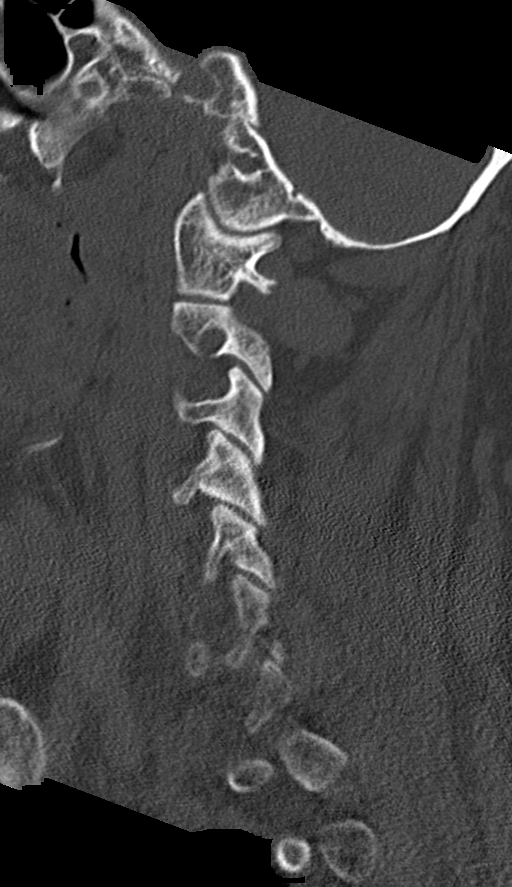
[im 26/61  bone]
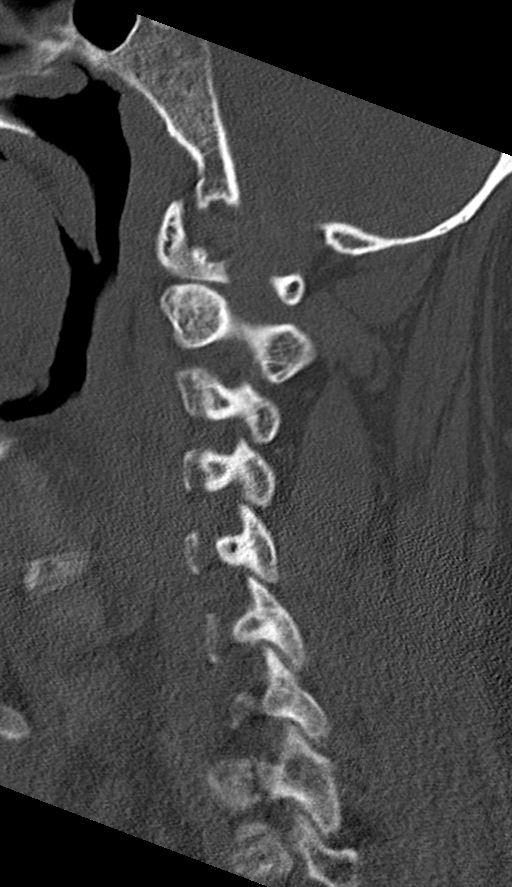
[im 31/61  soft-tissue]
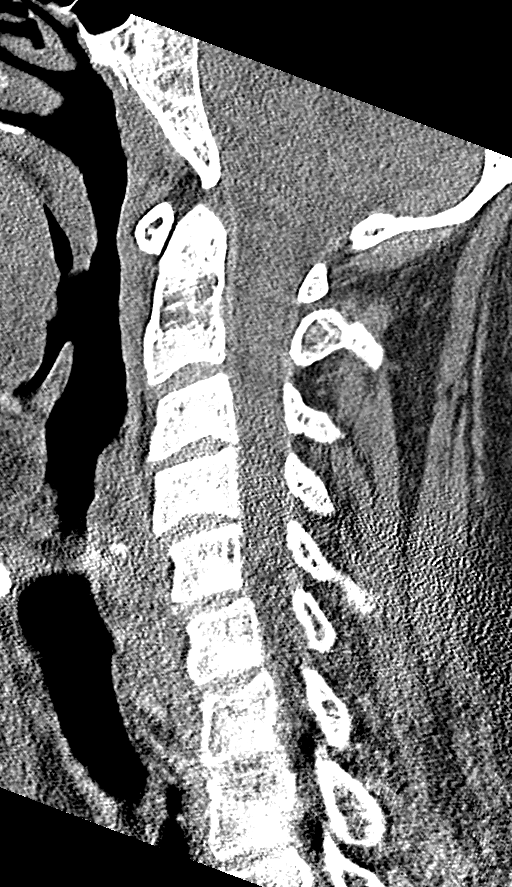
[im 31/61  bone]
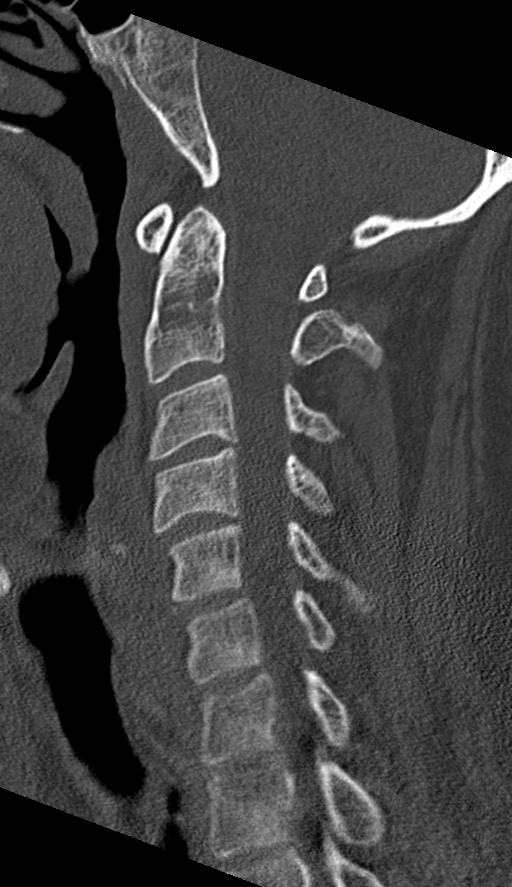
[im 36/61  bone]
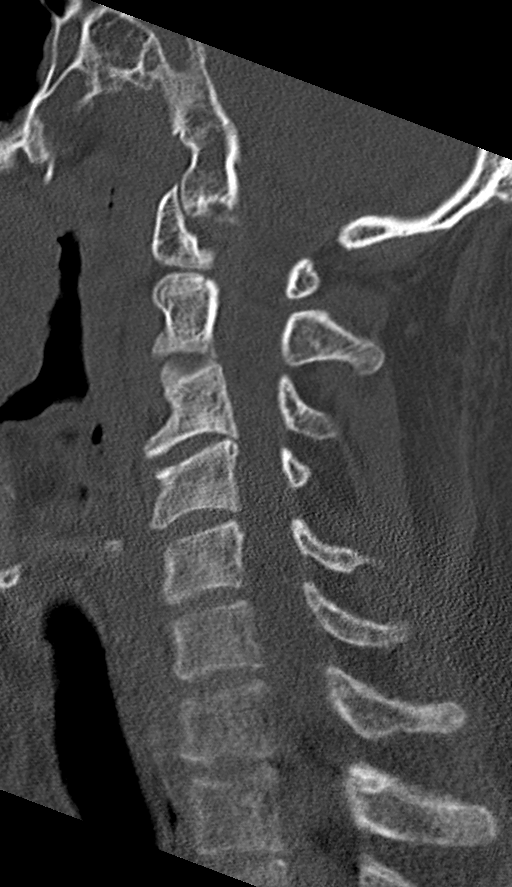
[im 41/61  bone]
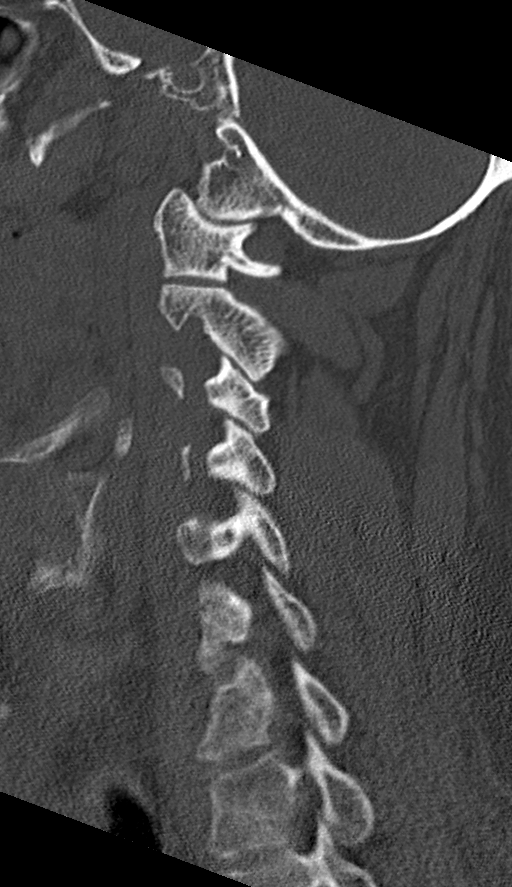

[Series 6: coronal bone · coronal · 0.29mm/px · 3 of 66 slices shown]
[im 14/66  bone]
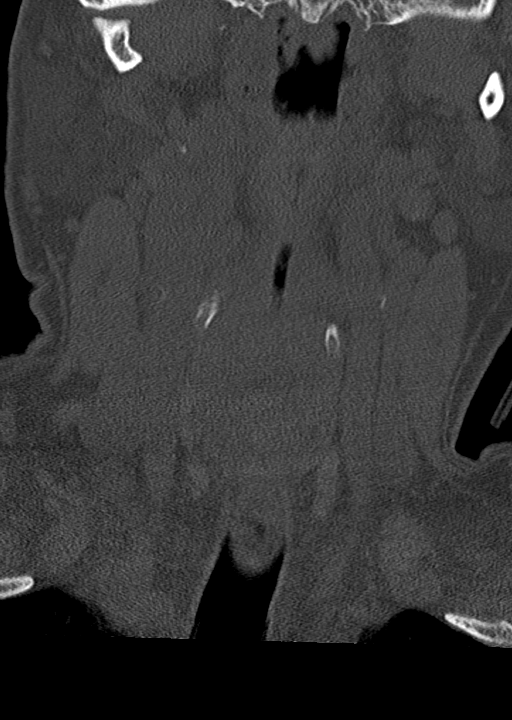
[im 27/66  bone]
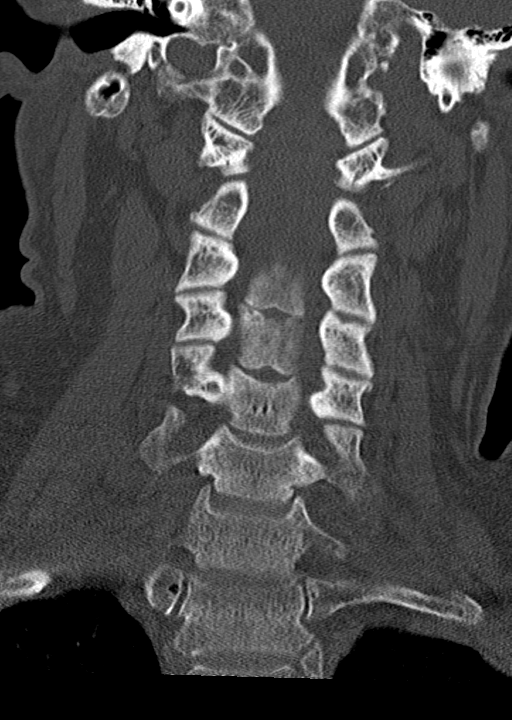
[im 40/66  bone]
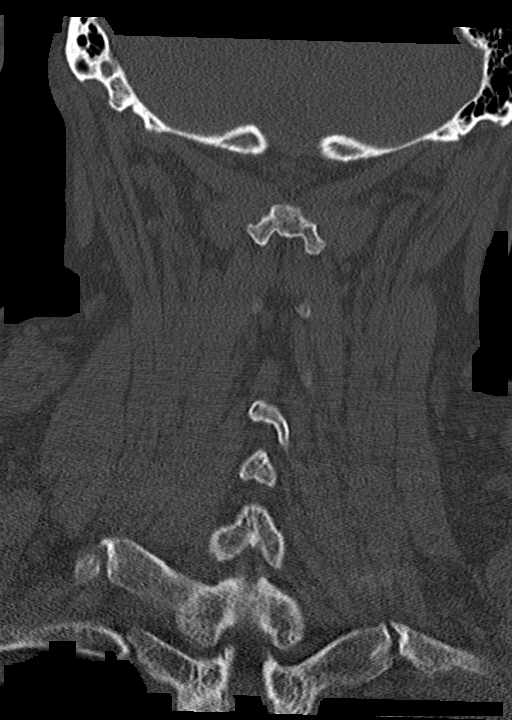

[Series 7: orthogonal axials · axial · 0.21mm/px · z∈[-176,-63]mm · 4 of 88 slices shown, 5 images]
[im 15/88  soft-tissue]
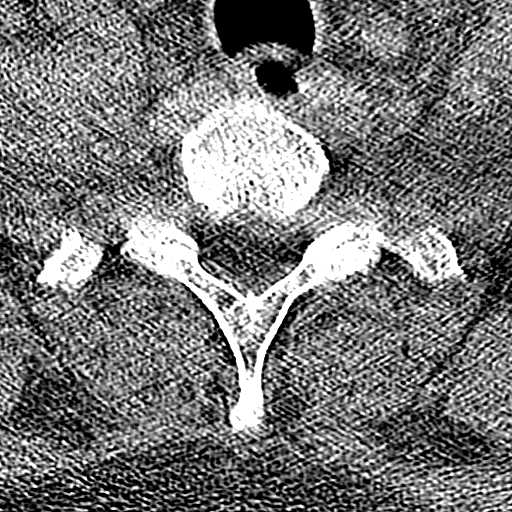
[im 15/88  bone]
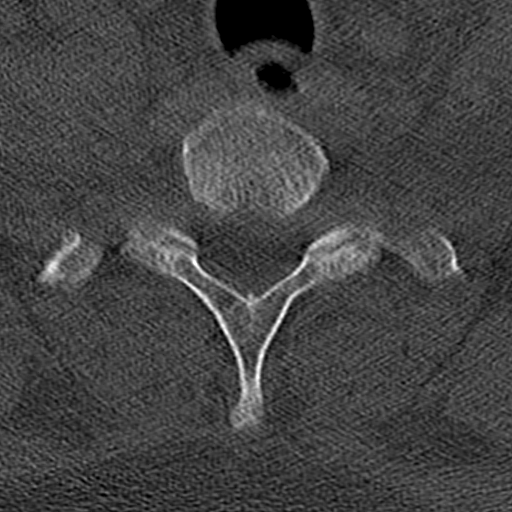
[im 30/88  bone]
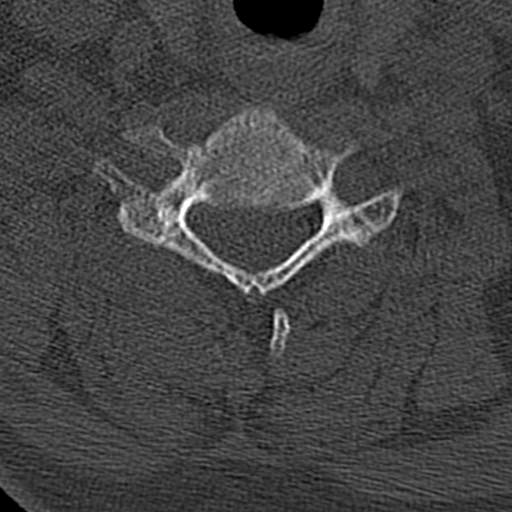
[im 59/88  bone]
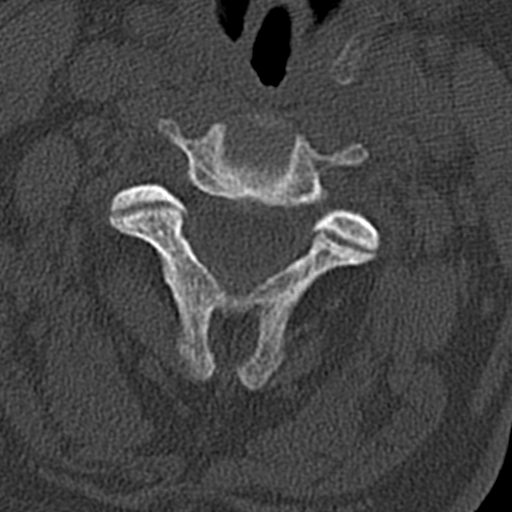
[im 73/88  bone]
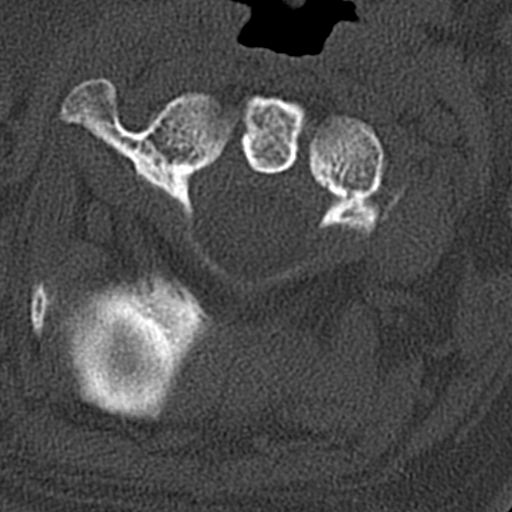

[12 of 33 positions shown; findings below may reference images not displayed]

FINDINGS: Alignment: Normal.

Skull base and vertebrae: No acute fracture. No primary bone lesion
or focal pathologic process.

Soft tissues and spinal canal: No prevertebral fluid or swelling. No
visible canal hematoma.

Disc levels: Mild right anterolateral osteophyte formation is seen
at the level of C3-C4.

Normal multilevel intervertebral disc spaces are seen.

Normal bilateral multilevel facet joints are noted.

Upper chest: Negative.

Other: None.
IMPRESSION: 1. Very mild degenerative changes at the level of C3-C4.
2. No acute cervical spine fracture or subluxation.

## 2022-04-27 ENCOUNTER — Ambulatory Visit: Payer: Medicaid Other | Admitting: Orthopedic Surgery

## 2022-04-29 ENCOUNTER — Ambulatory Visit (INDEPENDENT_AMBULATORY_CARE_PROVIDER_SITE_OTHER): Payer: Medicaid Other | Admitting: Family

## 2022-04-29 ENCOUNTER — Encounter: Payer: Self-pay | Admitting: Family

## 2022-04-29 DIAGNOSIS — Z89512 Acquired absence of left leg below knee: Secondary | ICD-10-CM | POA: Diagnosis not present

## 2022-04-29 DIAGNOSIS — Z89511 Acquired absence of right leg below knee: Secondary | ICD-10-CM | POA: Diagnosis not present

## 2022-04-29 NOTE — Progress Notes (Signed)
Office Visit Note   Patient: [name]           Date of Birth: 1965-07-11           MRN: [id number] Visit Date: 04/29/2022              Requested by: Abran Richard, MD [address] [place],  [place] [address] PCP: Abran Richard, MD  Chief Complaint  Patient presents with   Right Leg - Follow-up    Hx bilateral BKA   Left Leg - Follow-up      HPI: The patient is a 57 year old gentleman seen for concern of ill fitting prostheses bilaterally he is status post right below-knee amputation in 2022 left below-knee amputation 2021 he has been wearing 4 layers of ply bilaterally these are thin and worn out he states he can feel his residual limbs sucking and slipping in and out even with applied.  He has significant pain from end bearing on the left some pain on the right with the development of a callused ulceration to the residual limb on the right  Patient is an existing bilateral transtibial  amputee.  Patient's current comorbidities are not expected to impact the ability to function with the prescribed prosthesis. Patient verbally communicates a strong desire to use a prosthesis. Patient currently requires mobility aids to ambulate without a prosthesis.  Expects not to use mobility aids with a new prosthesis.  Patient is a K3 level ambulator that spends a lot of time walking around on uneven terrain over obstacles, up and down stairs, and ambulates with a variable cadence.     Assessment & Plan: Visit Diagnoses: No diagnosis found.  Plan: Given an order for new prosthesis set up to [facility] clinic.  Will fax this on behalf of the patient.  Follow-up as needed.  Follow-Up Instructions: No follow-ups on file.   Ortho Exam  Patient is alert, oriented, no adenopathy, well-dressed, normal affect, normal respiratory effort. On examination of the left residual limb this is well consolidated and well-healed there appears to been a volume loss.  There is no callus no  impending ulceration.  To the right residual limb this is well consolidated as well there is callused ulceration over the distal tibia with an open area which is 1 cm in length 2 mm in width there is granulation tissue no surrounding erythema no edema no sign of infection  Imaging: No results found. No images are attached to the encounter.  Labs: Lab Results  Component Value Date   HGBA1C 9.7 (H) 05/15/2020   HGBA1C 11.4 (H) 12/13/2019   HGBA1C 9.7 (H) 11/09/2018   REPTSTATUS 07/14/2017 FINAL 07/09/2017   REPTSTATUS 07/14/2017 FINAL 07/09/2017   CULT  07/09/2017    NO GROWTH 5 DAYS Performed at [facility], [address], [place], [place] [address]    CULT  07/09/2017    NO GROWTH 5 DAYS Performed at [facility], [address], [place], [place] [address]      Lab Results  Component Value Date   ALBUMIN 3.4 (L) 08/18/2020   ALBUMIN 3.1 (L) 07/03/2020   ALBUMIN 2.9 (L) 05/14/2020    Lab Results  Component Value Date   MG 1.5 (L) 05/17/2020   MG 1.5 (L) 05/16/2020   MG 1.4 (L) 07/11/2017   No results found for: "VD25OH"  No results found for: "PREALBUMIN"    Latest Ref Rng & Units 08/18/2020    1:40 AM 07/03/2020   12:10 PM  05/17/2020    1:16 AM  CBC EXTENDED  WBC 4.0 - 10.5 K/uL 7.9  6.2  5.9   RBC 4.22 - 5.81 MIL/uL 3.90  3.91  3.18   Hemoglobin 13.0 - 17.0 g/dL 9.5  9.8  8.4   HCT 16.6 - 52.0 % 30.6  32.4  27.1   Platelets 150 - 400 K/uL 223  244  273      There is no height or weight on file to calculate BMI.  Orders:  No orders of the defined types were placed in this encounter.  No orders of the defined types were placed in this encounter.    Procedures: No procedures performed  Clinical Data: No additional findings.  ROS:  All other systems negative, except as noted in the HPI. Review of Systems  Objective: Vital Signs: There were no vitals taken for this visit.  Specialty Comments:  No specialty comments available.  PMFS  History: Patient Active Problem List   Diagnosis Date Noted   Acute hematogenous osteomyelitis of right foot (HCC) 07/03/2020   Subacute osteomyelitis, right ankle and foot (HCC)    Foot ulcer (HCC) 05/15/2020   Sepsis (HCC) 05/15/2020   COPD (chronic obstructive pulmonary disease) (HCC) 05/15/2020   Diabetic foot infection (HCC)    Acquired absence of left leg below knee (HCC) 12/29/2019   Abscess of left foot 12/13/2019   Diabetes mellitus type II, non insulin dependent (HCC) 07/09/2017   Subacute osteomyelitis, left ankle and foot (HCC) 07/09/2017   Hypokalemia 07/09/2017   Hyponatremia 07/09/2017   AKI (acute kidney injury) (HCC) 07/09/2017   Symptomatic anemia 07/09/2017   Essential hypertension 07/09/2017   Depression with anxiety 07/09/2017   Venous thrombosis 07/09/2017   Past Medical History:  Diagnosis Date   Abscess of right foot    ADHD    Anemia    Anxiety    Arthritis    Asthma    COPD (chronic obstructive pulmonary disease) (HCC)    Diabetes mellitus    Diabetic foot ulcer (HCC) 07/09/2017   Diabetic ulcer of back associated with type 2 diabetes mellitus, limited to breakdown of skin (HCC)    Foot osteomyelitis, left (HCC)    GERD (gastroesophageal reflux disease)    Gout    History of blood transfusion    Hypertension    Myocardial infarction (HCC)    ? 2008,  states he did not have a cath done and never saw a cardiologist   Neuropathy    Pancreatitis    Stroke [facility])    TIA- no residual   Wears glasses     History reviewed. No pertinent family history.  Past Surgical History:  Procedure Laterality Date   AMPUTATION Left 11/09/2018   Procedure: LEFT TRANSMETATARSAL AMPUTATION;  Surgeon: Nadara Mustard, MD;  Location: [facility] OR;  Service: Orthopedics;  Laterality: Left;   AMPUTATION Left 12/13/2019   Procedure: LEFT BELOW KNEE AMPUTATION;  Surgeon: Nadara Mustard, MD;  Location: [facility] OR;  Service: Orthopedics;  Laterality: Left;   AMPUTATION Right 05/15/2020    Procedure: RIGHT TRANSMETATARSAL AMPUTATION;  Surgeon: Nadara Mustard, MD;  Location: [facility] OR;  Service: Orthopedics;  Laterality: Right;   AMPUTATION Right 07/03/2020   Procedure: RIGHT BELOW KNEE AMPUTATION;  Surgeon: Nadara Mustard, MD;  Location: [facility] OR;  Service: Orthopedics;  Laterality: Right;  Right below knee amputation   APPENDECTOMY     BELOW KNEE LEG AMPUTATION Right 07/03/2020   FOOT SURGERY Left  LAPAROSCOPIC APPENDECTOMY  07/28/2011   Procedure: APPENDECTOMY LAPAROSCOPIC;  Surgeon: Donato Heinz, MD;  Location[facility];  Service: General;  Laterality: N/A;   LEG SURGERY     Social History   Occupational History   Not on file  Tobacco Use   Smoking status: Former    Types: Cigarettes   Smokeless tobacco: Never   Tobacco comments:    quit smoking cigarettes 27 years ago 09/05/19  Vaping Use   Vaping Use: Never used  Substance and Sexual Activity   Alcohol use: Yes    Comment: occasionally   Drug use: Not Currently    Types: Marijuana   Sexual activity: Not on file

## 2022-05-11 ENCOUNTER — Ambulatory Visit: Payer: Medicaid Other | Admitting: Orthopedic Surgery

## 2022-06-22 ENCOUNTER — Telehealth: Payer: Self-pay | Admitting: Orthopedic Surgery

## 2022-06-22 NOTE — Telephone Encounter (Signed)
Patient's wife called advised patient passed away Friday 2022/07/01. The number to contact Hassan Rowan is needed is [phone]

## 2022-06-23 NOTE — Telephone Encounter (Signed)
Dr. Sharol Given has been made aware.

## 2022-07-20 DEATH — deceased
